# Patient Record
Sex: Male | Born: 1955 | Race: White | Hispanic: No | Smoking: Current every day smoker
Health system: Southern US, Community
[De-identification: ages and names within clinical notes are randomized; demographics above are authoritative.]

## PROBLEM LIST (undated history)

## (undated) DIAGNOSIS — C44692 Other specified malignant neoplasm of skin of right upper limb, including shoulder: Secondary | ICD-10-CM

## (undated) DIAGNOSIS — R112 Nausea with vomiting, unspecified: Secondary | ICD-10-CM

## (undated) DIAGNOSIS — C801 Malignant (primary) neoplasm, unspecified: Secondary | ICD-10-CM

## (undated) DIAGNOSIS — Z9889 Other specified postprocedural states: Secondary | ICD-10-CM

## (undated) DIAGNOSIS — F419 Anxiety disorder, unspecified: Secondary | ICD-10-CM

## (undated) DIAGNOSIS — R0600 Dyspnea, unspecified: Secondary | ICD-10-CM

## (undated) DIAGNOSIS — M545 Low back pain, unspecified: Secondary | ICD-10-CM

## (undated) DIAGNOSIS — K219 Gastro-esophageal reflux disease without esophagitis: Secondary | ICD-10-CM

## (undated) DIAGNOSIS — G8929 Other chronic pain: Secondary | ICD-10-CM

## (undated) DIAGNOSIS — T8859XA Other complications of anesthesia, initial encounter: Secondary | ICD-10-CM

## (undated) DIAGNOSIS — C61 Malignant neoplasm of prostate: Secondary | ICD-10-CM

## (undated) DIAGNOSIS — E785 Hyperlipidemia, unspecified: Secondary | ICD-10-CM

## (undated) HISTORY — PX: APPENDECTOMY: SHX54

## (undated) HISTORY — PX: OTHER SURGICAL HISTORY: SHX169

---

## 1997-01-08 HISTORY — PX: INGUINAL HERNIA REPAIR: SUR1180

## 2002-05-12 ENCOUNTER — Ambulatory Visit (HOSPITAL_COMMUNITY): Admission: RE | Admit: 2002-05-12 | Discharge: 2002-05-12 | Payer: Self-pay | Admitting: Internal Medicine

## 2002-05-12 ENCOUNTER — Encounter: Payer: Self-pay | Admitting: Internal Medicine

## 2002-05-20 ENCOUNTER — Encounter: Payer: Self-pay | Admitting: Internal Medicine

## 2002-05-20 ENCOUNTER — Ambulatory Visit (HOSPITAL_COMMUNITY): Admission: RE | Admit: 2002-05-20 | Discharge: 2002-05-20 | Payer: Self-pay | Admitting: Internal Medicine

## 2002-06-01 ENCOUNTER — Ambulatory Visit (HOSPITAL_COMMUNITY): Admission: RE | Admit: 2002-06-01 | Discharge: 2002-06-01 | Payer: Self-pay | Admitting: Internal Medicine

## 2003-04-26 ENCOUNTER — Ambulatory Visit (HOSPITAL_COMMUNITY): Admission: RE | Admit: 2003-04-26 | Discharge: 2003-04-26 | Payer: Self-pay | Admitting: Otolaryngology

## 2004-07-25 ENCOUNTER — Ambulatory Visit (HOSPITAL_COMMUNITY): Admission: RE | Admit: 2004-07-25 | Discharge: 2004-07-25 | Payer: Self-pay | Admitting: Internal Medicine

## 2004-07-31 ENCOUNTER — Ambulatory Visit (HOSPITAL_COMMUNITY): Admission: RE | Admit: 2004-07-31 | Discharge: 2004-07-31 | Payer: Self-pay | Admitting: Internal Medicine

## 2004-08-24 ENCOUNTER — Ambulatory Visit (HOSPITAL_COMMUNITY): Admission: RE | Admit: 2004-08-24 | Discharge: 2004-08-24 | Payer: Self-pay | Admitting: Internal Medicine

## 2005-07-27 ENCOUNTER — Ambulatory Visit: Payer: Self-pay | Admitting: Internal Medicine

## 2005-07-27 ENCOUNTER — Ambulatory Visit (HOSPITAL_COMMUNITY): Admission: RE | Admit: 2005-07-27 | Discharge: 2005-07-27 | Payer: Self-pay | Admitting: Internal Medicine

## 2010-01-08 HISTORY — PX: KNEE ARTHROSCOPY: SUR90

## 2011-06-11 ENCOUNTER — Other Ambulatory Visit (HOSPITAL_COMMUNITY): Payer: Self-pay | Admitting: Orthopaedic Surgery

## 2011-06-11 DIAGNOSIS — M25562 Pain in left knee: Secondary | ICD-10-CM

## 2011-06-13 ENCOUNTER — Other Ambulatory Visit (HOSPITAL_COMMUNITY): Payer: Self-pay

## 2011-06-18 ENCOUNTER — Ambulatory Visit (HOSPITAL_COMMUNITY)
Admission: RE | Admit: 2011-06-18 | Discharge: 2011-06-18 | Disposition: A | Discharge status: Home / Self Care | Payer: Managed Care, Other (non HMO) | Source: Ambulatory Visit | Attending: Orthopaedic Surgery | Admitting: Orthopaedic Surgery

## 2011-06-18 DIAGNOSIS — M23329 Other meniscus derangements, posterior horn of medial meniscus, unspecified knee: Secondary | ICD-10-CM | POA: Insufficient documentation

## 2011-06-18 DIAGNOSIS — M25569 Pain in unspecified knee: Secondary | ICD-10-CM | POA: Insufficient documentation

## 2011-06-18 DIAGNOSIS — M2350 Chronic instability of knee, unspecified knee: Secondary | ICD-10-CM | POA: Insufficient documentation

## 2011-06-18 DIAGNOSIS — M25562 Pain in left knee: Secondary | ICD-10-CM

## 2012-01-22 ENCOUNTER — Ambulatory Visit (HOSPITAL_COMMUNITY)
Admission: RE | Admit: 2012-01-22 | Discharge: 2012-01-22 | Disposition: A | Discharge status: Home / Self Care | Payer: BC Managed Care – PPO | Source: Ambulatory Visit | Attending: Orthopaedic Surgery | Admitting: Orthopaedic Surgery

## 2012-01-22 DIAGNOSIS — M545 Low back pain, unspecified: Secondary | ICD-10-CM | POA: Insufficient documentation

## 2012-01-22 DIAGNOSIS — IMO0001 Reserved for inherently not codable concepts without codable children: Secondary | ICD-10-CM | POA: Insufficient documentation

## 2012-01-22 DIAGNOSIS — M6281 Muscle weakness (generalized): Secondary | ICD-10-CM | POA: Insufficient documentation

## 2012-01-22 NOTE — Evaluation (Signed)
Physical Therapy Evaluation  Patient Details  Name: Carlos [name] MRN: [id number] Date of Birth: 10-Oct-1955  Today's Date: 01/22/2012 Time: 1308-6578 PT Time Calculation (min): 40 min Charges: 1 eval, 10' NMR Visit#: 1  of 8   Re-eval: 02/21/12 Assessment Diagnosis: LBP Next MD Visit: Dr. Magnus Ivan - 02/19/12  Authorization:    Authorization Time Period:    Authorization Visit#:   of     Past Medical History: No past medical history on file. Past Surgical History: No past surgical history on file.  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  His c/co is pain when he moves, when he stands for more than 30 minutes, L side hurts worse than R side How long can you sit comfortably?: no difficulty  How long can you stand comfortably?: less than 30 minutes and feels like his legs get weak.  How long can you walk comfortably?: no difficulty  Pain Assessment Currently in Pain?: Yes Pain Score:   3 (Range: 0-7/10.  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, kneeling, walking, squatting Comments: He enjoys going to church, hunting, fishing, and growing his vegetables  Cognition/Observation Observation/Other Assessments Observations: decreased flexibility to L quadricep, B hamstrings  Sensation/Coordination/Flexibility/Functional Tests Coordination Gross Motor Movements are Fluid and Coordinated: No Coordination and Movement Description: impaired core coordination to TrA, multifidus  and PF musculature  Functional Tests Functional Tests: ODI: 34%  Assessment RLE Strength Right Hip Flexion: 5/5 Right Hip Extension: 5/5 Right Hip ABduction: 5/5 Right Knee Flexion: 5/5 Right Knee Extension: 5/5 LLE Strength Left Hip Flexion: 5/5 Left Hip Extension: 5/5 Left Hip ABduction: 5/5 Left Knee Flexion: 5/5 Left Knee Extension: 5/5 Lumbar AROM Lumbar Flexion: WNL- no pain Lumbar Extension: decreased 75% - significant pain Lumbar - Right Side Bend: decreased 25% - pain Lumbar - Left Side Bend: decreased 25% - pain Palpation Palpation: significant fascial restrictions to L lumbar erector spinae   Exercise/Treatments Mobility/Balance  Posture/Postural Control Posture/Postural Control: Postural limitations Postural Limitations: ridgid spinal posture Static Standing Balance Single Leg Stance - Right Leg: 8  (impaired ankle strategy) Single Leg Stance - Left Leg: 8  Tandem Stance - Right Leg: 10  (impaired ankle strategy) Tandem Stance - Left Leg: 10  (impaired ankle strategy) Rhomberg - Eyes Opened: 10  Rhomberg - Eyes Closed: 10    Stretches   Aerobic   Machines for Strengthening   Standing   Seated   Supine Ab Set: Limitations AB Set Limitations: NMR to ab set 2x10 sec Other Supine Lumbar Exercises: Pelvic Floor: NMR using VC for appropriate contraction 3x10 sec holds after Sidelying   Prone  Other Prone Lumbar Exercises: multifidus: TC and VC for appropriate activation, unable to independently complete with significant compensations.  Quadruped       Physical Therapy Assessment and Plan PT Assessment and Plan Clinical Impression Statement: Pt is a 57 year old male referred to PT for LBP.   Pt will benefit from skilled therapeutic intervention in order to improve on the following  deficits: Pain;Decreased range of motion;Decreased coordination;Improper body mechanics Rehab Potential: Good PT Frequency: Min 2X/week PT Duration: 4 weeks PT  Treatment/Interventions: Functional mobility training;Therapeutic activities;Therapeutic exercise;Balance training;Neuromuscular re-education;Patient/family education;Manual techniques;Modalities PT Plan: Modalites as needed and manual techniques to decrease L errector spinae pain, core training, squats, heel/toe raises, balance activities.     Goals Home Exercise Program Pt will Perform Home Exercise Program: Independently PT Goal: Perform Home Exercise Program - Progress: Goal set today PT Short Term Goals Time to Complete Short Term Goals: 2 weeks PT Short Term Goal 1: Pt will report pain range 0-4/10 with lumbar movement PT Short Term Goal 2: Pt will improve lumbar back extension to WNL with pain less than 2/10 to L side of low back.  PT Short Term Goal 3: pt will improve core coordination and demonstrate TrA, PF and mutlifidus activiation independent of breath. PT Long Term Goals Time to Complete Long Term Goals: 4 weeks PT Long Term Goal 1: Pt will present with mild fascial restrictions throughout L errector spinae for improved QOL.  PT Long Term Goal 2: Pt will improve back AROM to WNL in order to complete his job requirements.  Long Term Goal 3: Pt will improve core strength in order to tolerate standing for greater than 30 minutes without increased pain.  Long Term Goal 4: Pt will improve his ODI to less than 22% for improved percieved functional ability.   Problem List Patient Active Problem List  Diagnosis  . Lumbago    PT Plan of Care PT Home Exercise Plan: see scanned report  PT Patient Instructions: importance of posture and HEP Consulted and Agree with Plan of Care: Patient  GP    Carlos [name] 01/22/2012, 4:07 PM  Physician Documentation Your signature is required to indicate approval of the treatment plan as stated above.  Please sign and either send electronically or make a copy of this report for your files and return this physician signed original.   Please mark  one 1.__approve of plan  2. ___approve of plan with the following conditions.   ______________________________                                                          _____________________ Physician Signature                                                                                                             Date

## 2012-01-25 ENCOUNTER — Inpatient Hospital Stay (HOSPITAL_COMMUNITY): Admission: RE | Admit: 2012-01-25 | Payer: Self-pay | Source: Ambulatory Visit

## 2012-01-29 ENCOUNTER — Inpatient Hospital Stay (HOSPITAL_COMMUNITY): Admission: RE | Admit: 2012-01-29 | Payer: Self-pay | Source: Ambulatory Visit | Admitting: Physical Therapy

## 2012-02-01 ENCOUNTER — Ambulatory Visit (HOSPITAL_COMMUNITY): Payer: Self-pay | Admitting: *Deleted

## 2012-02-05 ENCOUNTER — Ambulatory Visit (HOSPITAL_COMMUNITY): Payer: Self-pay | Admitting: Physical Therapy

## 2012-02-08 ENCOUNTER — Ambulatory Visit (HOSPITAL_COMMUNITY): Payer: Self-pay

## 2012-02-12 ENCOUNTER — Ambulatory Visit (HOSPITAL_COMMUNITY): Payer: Self-pay | Admitting: Physical Therapy

## 2012-02-15 ENCOUNTER — Ambulatory Visit (HOSPITAL_COMMUNITY): Payer: Self-pay | Admitting: Physical Therapy

## 2012-06-24 ENCOUNTER — Ambulatory Visit (HOSPITAL_COMMUNITY)
Admission: RE | Admit: 2012-06-24 | Discharge: 2012-06-24 | Disposition: A | Discharge status: Home / Self Care | Payer: BC Managed Care – PPO | Source: Ambulatory Visit | Attending: Internal Medicine | Admitting: Internal Medicine

## 2012-06-24 ENCOUNTER — Other Ambulatory Visit (HOSPITAL_COMMUNITY): Payer: Self-pay | Admitting: Internal Medicine

## 2012-06-24 DIAGNOSIS — M79605 Pain in left leg: Secondary | ICD-10-CM

## 2012-06-24 DIAGNOSIS — M545 Low back pain, unspecified: Secondary | ICD-10-CM | POA: Insufficient documentation

## 2012-06-24 DIAGNOSIS — M25559 Pain in unspecified hip: Secondary | ICD-10-CM | POA: Insufficient documentation

## 2012-06-24 DIAGNOSIS — M549 Dorsalgia, unspecified: Secondary | ICD-10-CM

## 2012-07-07 ENCOUNTER — Other Ambulatory Visit: Payer: Self-pay | Admitting: Plastic Surgery

## 2012-07-07 ENCOUNTER — Encounter (HOSPITAL_BASED_OUTPATIENT_CLINIC_OR_DEPARTMENT_OTHER): Payer: Self-pay | Admitting: *Deleted

## 2012-07-07 DIAGNOSIS — L98492 Non-pressure chronic ulcer of skin of other sites with fat layer exposed: Secondary | ICD-10-CM

## 2012-07-07 NOTE — H&P (Signed)
  This document contains confidential information from a [facility] medical record system and may be unauthenticated. Release may be made only with a valid authorization or in accordance with applicable policies of Medical Center or its affiliates. This document must be maintained in a secure manner or discarded/destroyed as required by Medical Center policy or by a confidential means such as shredding.    Carlos [name]  07/04/2012 9:30 AM   Initial consult  MRN:  [id number]  Provider: Wayland Denis, DO  Department:  Plastic Surgery  Dept Phone: [phone]     Diagnoses   Dermatofibrosarcoma protuberans of right upper extremity    -  Primary    173.69      Reason for Visit   Skin Problem       Vitals - Last Recorded    116/63  67  1.676 m (5\' 6" )  74.39 kg (164 lb)  26.48 kg/m2       Subjective:      Patient ID: Carlos [name] is a 57 y.o. male.   HPI The patient is a 57 yrs old wm here with his wife for evaluation of his right shoulder.  He had a nonhealing area that looked like a cyst for several months on his right shoulder.  The area was biopsied by Dr. Orvan Falconer and found to be a dermatofibroma protuberans.  He underwent mohs excision and the margins are now negative.  It is down to muscle with the fascia excised.  He is otherwise in stable and good health.  He is caring for the area with gauze at the present time.   The following portions of the patient's history were reviewed and updated as appropriate: allergies, current medications, past family history, past medical history, past social history, past surgical history and problem list.  Review of Systems  Constitutional: Negative.   HENT: Negative.   Eyes: Negative.   Respiratory: Negative.   Cardiovascular: Negative.   Gastrointestinal: Negative.   Endocrine: Negative.   Genitourinary: Negative.   Neurological: Negative.   Hematological: Negative.   Psychiatric/Behavioral: Negative.       Objective:    Physical Exam  Constitutional: He appears well-developed and well-nourished.  HENT:   Head: Normocephalic and atraumatic.  Eyes: Conjunctivae and EOM are normal. Pupils are equal, round, and reactive to light.  Neck: Normal range of motion.  Cardiovascular: Normal rate.   Pulmonary/Chest: Effort normal.  Abdominal: Soft.  Musculoskeletal: Normal range of motion.       Right shoulder: He exhibits no tenderness.       Arms: Neurological: He is alert.  Skin: Skin is warm.  Psychiatric: He has a normal mood and affect. His behavior is normal. Judgment and thought content normal.      Assessment:   1.  Dermatofibrosarcoma protuberans of right upper extremity        Plan:     Recommend ACell and Vac placement to improve healing outcome. Acell applied LP259-10, REF MM0100, SER [id number] LP201-10, REF MM0100, SER [id number]

## 2012-07-07 NOTE — Progress Notes (Addendum)
SPOKE W/ PT WIFE. NPO AFTER MN. ARRIVES AT 0830. NEEDS HG. VERBALIZED TO BRING VAC AND SUPPLIES DOS.

## 2012-07-09 ENCOUNTER — Encounter (HOSPITAL_BASED_OUTPATIENT_CLINIC_OR_DEPARTMENT_OTHER): Payer: Self-pay | Admitting: Plastic Surgery

## 2012-07-09 ENCOUNTER — Ambulatory Visit (HOSPITAL_BASED_OUTPATIENT_CLINIC_OR_DEPARTMENT_OTHER): Payer: BC Managed Care – PPO | Admitting: Anesthesiology

## 2012-07-09 ENCOUNTER — Ambulatory Visit: Admit: 2012-07-09 | Payer: Self-pay | Admitting: Plastic Surgery

## 2012-07-09 ENCOUNTER — Encounter (HOSPITAL_BASED_OUTPATIENT_CLINIC_OR_DEPARTMENT_OTHER): Payer: Self-pay | Admitting: Anesthesiology

## 2012-07-09 ENCOUNTER — Ambulatory Visit (HOSPITAL_BASED_OUTPATIENT_CLINIC_OR_DEPARTMENT_OTHER)
Admission: RE | Admit: 2012-07-09 | Discharge: 2012-07-09 | Disposition: A | Discharge status: Home / Self Care | Payer: BC Managed Care – PPO | Source: Ambulatory Visit | Attending: Plastic Surgery | Admitting: Plastic Surgery

## 2012-07-09 ENCOUNTER — Encounter (HOSPITAL_BASED_OUTPATIENT_CLINIC_OR_DEPARTMENT_OTHER)
Admission: RE | Disposition: A | Discharge status: Home / Self Care | Payer: Self-pay | Source: Ambulatory Visit | Attending: Plastic Surgery

## 2012-07-09 DIAGNOSIS — L988 Other specified disorders of the skin and subcutaneous tissue: Secondary | ICD-10-CM | POA: Insufficient documentation

## 2012-07-09 DIAGNOSIS — C44509 Unspecified malignant neoplasm of skin of other part of trunk: Secondary | ICD-10-CM | POA: Diagnosis present

## 2012-07-09 DIAGNOSIS — C44599 Other specified malignant neoplasm of skin of other part of trunk: Secondary | ICD-10-CM | POA: Diagnosis present

## 2012-07-09 DIAGNOSIS — L98492 Non-pressure chronic ulcer of skin of other sites with fat layer exposed: Secondary | ICD-10-CM

## 2012-07-09 DIAGNOSIS — Z85828 Personal history of other malignant neoplasm of skin: Secondary | ICD-10-CM | POA: Insufficient documentation

## 2012-07-09 HISTORY — DX: Other specified malignant neoplasm of skin of right upper limb, including shoulder: C44.692

## 2012-07-09 HISTORY — DX: Low back pain, unspecified: M54.50

## 2012-07-09 HISTORY — PX: APPLICATION OF A-CELL OF EXTREMITY: SHX6303

## 2012-07-09 HISTORY — DX: Low back pain: M54.5

## 2012-07-09 HISTORY — DX: Other chronic pain: G89.29

## 2012-07-09 HISTORY — DX: Hyperlipidemia, unspecified: E78.5

## 2012-07-09 HISTORY — DX: Anxiety disorder, unspecified: F41.9

## 2012-07-09 SURGERY — APPLICATION OF A-CELL OF EXTREMITY
Anesthesia: General | Site: Shoulder | Laterality: Right

## 2012-07-09 SURGERY — APPLICATION OF A-CELL OF EXTREMITY
Anesthesia: General | Site: Shoulder | Laterality: Right | Wound class: Clean

## 2012-07-09 MED ORDER — PROPOFOL 10 MG/ML IV BOLUS
INTRAVENOUS | Status: DC | PRN
  Administered 2012-07-09: 200 mg via INTRAVENOUS

## 2012-07-09 MED ORDER — SODIUM CHLORIDE 0.9 % IR SOLN
Status: DC | PRN
  Administered 2012-07-09: 11:00:00

## 2012-07-09 MED ORDER — EPHEDRINE SULFATE 50 MG/ML IJ SOLN
INTRAMUSCULAR | Status: DC | PRN
  Administered 2012-07-09: 10 mg via INTRAVENOUS

## 2012-07-09 MED ORDER — DEXAMETHASONE SODIUM PHOSPHATE 4 MG/ML IJ SOLN
INTRAMUSCULAR | Status: DC | PRN
  Administered 2012-07-09: 10 mg via INTRAVENOUS

## 2012-07-09 MED ORDER — ONDANSETRON HCL 4 MG/2ML IJ SOLN
INTRAMUSCULAR | Status: DC | PRN
  Administered 2012-07-09: 4 mg via INTRAVENOUS

## 2012-07-09 MED ORDER — FENTANYL CITRATE 0.05 MG/ML IJ SOLN
25.0000 ug | INTRAMUSCULAR | Status: DC | PRN
  Administered 2012-07-09 (×2): 50 ug via INTRAVENOUS
  Filled 2012-07-09: qty 1

## 2012-07-09 MED ORDER — LACTATED RINGERS IV SOLN
INTRAVENOUS | Status: DC
  Administered 2012-07-09: 100 mL/h via INTRAVENOUS
  Filled 2012-07-09: qty 1000

## 2012-07-09 MED ORDER — PROMETHAZINE HCL 25 MG/ML IJ SOLN
6.2500 mg | INTRAMUSCULAR | Status: DC | PRN
  Filled 2012-07-09: qty 1

## 2012-07-09 MED ORDER — CEFAZOLIN SODIUM-DEXTROSE 2-3 GM-% IV SOLR
2.0000 g | INTRAVENOUS | Status: AC
  Administered 2012-07-09: 2 g via INTRAVENOUS
  Filled 2012-07-09: qty 50

## 2012-07-09 MED ORDER — KETOROLAC TROMETHAMINE 30 MG/ML IJ SOLN
15.0000 mg | Freq: Once | INTRAMUSCULAR | Status: DC | PRN
  Filled 2012-07-09: qty 1

## 2012-07-09 MED ORDER — FENTANYL CITRATE 0.05 MG/ML IJ SOLN
INTRAMUSCULAR | Status: DC | PRN
  Administered 2012-07-09: 100 ug via INTRAVENOUS

## 2012-07-09 MED ORDER — MIDAZOLAM HCL 5 MG/5ML IJ SOLN
INTRAMUSCULAR | Status: DC | PRN
  Administered 2012-07-09: 2 mg via INTRAVENOUS

## 2012-07-09 MED ORDER — OXYCODONE HCL 5 MG PO TABS
5.0000 mg | ORAL_TABLET | ORAL | Status: DC | PRN
  Administered 2012-07-09: 5 mg via ORAL
  Filled 2012-07-09: qty 1

## 2012-07-09 MED ORDER — LIDOCAINE HCL (CARDIAC) 20 MG/ML IV SOLN
INTRAVENOUS | Status: DC | PRN
  Administered 2012-07-09: 80 mg via INTRAVENOUS

## 2012-07-09 SURGICAL SUPPLY — 94 items
APL SKNCLS STERI-STRIP NONHPOA (GAUZE/BANDAGES/DRESSINGS)
BAG DECANTER FOR FLEXI CONT (MISCELLANEOUS) IMPLANT
BANDAGE ELASTIC 3 VELCRO ST LF (GAUZE/BANDAGES/DRESSINGS) IMPLANT
BANDAGE ELASTIC 4 VELCRO ST LF (GAUZE/BANDAGES/DRESSINGS) IMPLANT
BANDAGE ELASTIC 6 VELCRO ST LF (GAUZE/BANDAGES/DRESSINGS) IMPLANT
BANDAGE GAUZE ELAST BULKY 4 IN (GAUZE/BANDAGES/DRESSINGS) IMPLANT
BENZOIN TINCTURE PRP APPL 2/3 (GAUZE/BANDAGES/DRESSINGS) IMPLANT
BLADE MINI RND TIP GREEN BEAV (BLADE) IMPLANT
BLADE SURG 10 STRL SS (BLADE) IMPLANT
BLADE SURG 15 STRL LF DISP TIS (BLADE) ×1 IMPLANT
BLADE SURG 15 STRL SS (BLADE) ×2
BNDG CMPR 9X4 STRL LF SNTH (GAUZE/BANDAGES/DRESSINGS)
BNDG COHESIVE 1X5 TAN STRL LF (GAUZE/BANDAGES/DRESSINGS) ×1 IMPLANT
BNDG COHESIVE 4X5 TAN NS LF (GAUZE/BANDAGES/DRESSINGS) IMPLANT
BNDG ESMARK 4X9 LF (GAUZE/BANDAGES/DRESSINGS) IMPLANT
CANISTER SUCT LVC 12 LTR MEDI- (MISCELLANEOUS) IMPLANT
CANISTER SUCTION 1200CC (MISCELLANEOUS) IMPLANT
CANISTER SUCTION 2500CC (MISCELLANEOUS) ×1 IMPLANT
CHLORAPREP W/TINT 26ML (MISCELLANEOUS) IMPLANT
CLOTH BEACON ORANGE TIMEOUT ST (SAFETY) ×2 IMPLANT
CORDS BIPOLAR (ELECTRODE) IMPLANT
COVER MAYO STAND STRL (DRAPES) ×2 IMPLANT
COVER TABLE BACK 60X90 (DRAPES) ×2 IMPLANT
DECANTER SPIKE VIAL GLASS SM (MISCELLANEOUS) IMPLANT
DRAIN PENROSE 18X1/2 LTX STRL (DRAIN) ×1 IMPLANT
DRAPE EXTREMITY T 121X128X90 (DRAPE) IMPLANT
DRAPE INCISE IOBAN 66X45 STRL (DRAPES) ×1 IMPLANT
DRAPE LG THREE QUARTER DISP (DRAPES) IMPLANT
DRSG EMULSION OIL 3X3 NADH (GAUZE/BANDAGES/DRESSINGS) ×1 IMPLANT
DRSG PAD ABDOMINAL 8X10 ST (GAUZE/BANDAGES/DRESSINGS) IMPLANT
ELECT NDL BLADE 2-5/6 (NEEDLE) IMPLANT
ELECT NDL TIP 2.8 STRL (NEEDLE) IMPLANT
ELECT NEEDLE BLADE 2-5/6 (NEEDLE) IMPLANT
ELECT NEEDLE TIP 2.8 STRL (NEEDLE) IMPLANT
ELECT REM PT RETURN 9FT ADLT (ELECTROSURGICAL) ×2
ELECTRODE REM PT RTRN 9FT ADLT (ELECTROSURGICAL) ×1 IMPLANT
GAUZE SPONGE 4X4 12PLY STRL LF (GAUZE/BANDAGES/DRESSINGS) IMPLANT
GAUZE XEROFORM 1X8 LF (GAUZE/BANDAGES/DRESSINGS) IMPLANT
GAUZE XEROFORM 5X9 LF (GAUZE/BANDAGES/DRESSINGS) IMPLANT
GLOVE BIO SURGEON STRL SZ 6.5 (GLOVE) ×3 IMPLANT
GLOVE ECLIPSE 6.5 STRL STRAW (GLOVE) ×1 IMPLANT
GLOVE INDICATOR 6.0 STRL GRN (GLOVE) ×1 IMPLANT
GOWN PREVENTION PLUS XLARGE (GOWN DISPOSABLE) ×1 IMPLANT
HANDPIECE INTERPULSE COAX TIP (DISPOSABLE)
IV NS IRRIG 3000ML ARTHROMATIC (IV SOLUTION) IMPLANT
MATRIX SURGICAL PSM 10X15CM (Tissue) ×1 IMPLANT
MICROMATRIX 500MG (Tissue) ×4 IMPLANT
NDL HYPO 30GX1 BEV (NEEDLE) IMPLANT
NEEDLE 27GAX1X1/2 (NEEDLE) IMPLANT
NEEDLE HYPO 30GX1 BEV (NEEDLE) IMPLANT
NS IRRIG 1000ML POUR BTL (IV SOLUTION) ×2 IMPLANT
PACK BASIN DAY SURGERY FS (CUSTOM PROCEDURE TRAY) ×2 IMPLANT
PADDING CAST ABS 3INX4YD NS (CAST SUPPLIES)
PADDING CAST ABS 4INX4YD NS (CAST SUPPLIES)
PADDING CAST ABS COTTON 3X4 (CAST SUPPLIES) IMPLANT
PADDING CAST ABS COTTON 4X4 ST (CAST SUPPLIES) IMPLANT
PENCIL BUTTON HOLSTER BLD 10FT (ELECTRODE) ×1 IMPLANT
SET HNDPC FAN SPRY TIP SCT (DISPOSABLE) IMPLANT
SLEEVE SCD COMPRESS KNEE MED (MISCELLANEOUS) IMPLANT
SLING ARM FOAM STRAP MED (SOFTGOODS) ×1 IMPLANT
SOLUTION PARTIC MCRMTRX 500MG (Tissue) IMPLANT
SPLINT PLASTER CAST XFAST 3X15 (CAST SUPPLIES) IMPLANT
SPLINT PLASTER XTRA FASTSET 3X (CAST SUPPLIES)
SPONGE GAUZE 4X4 12PLY (GAUZE/BANDAGES/DRESSINGS) ×2 IMPLANT
SPONGE LAP 18X18 X RAY DECT (DISPOSABLE) IMPLANT
SPONGE LAP 4X18 X RAY DECT (DISPOSABLE) IMPLANT
STAPLER VISISTAT 35W (STAPLE) IMPLANT
STOCKINETTE 4X48 STRL (DRAPES) IMPLANT
STOCKINETTE 6  STRL (DRAPES) ×1
STOCKINETTE 6 STRL (DRAPES) ×1 IMPLANT
STOCKINETTE IMPERVIOUS LG (DRAPES) IMPLANT
STRIP CLOSURE SKIN 1/2X4 (GAUZE/BANDAGES/DRESSINGS) IMPLANT
SUCTION FRAZIER TIP 10 FR DISP (SUCTIONS) IMPLANT
SURGILUBE 2OZ TUBE FLIPTOP (MISCELLANEOUS) IMPLANT
SUT ETHILON 3 0 PS 1 (SUTURE) IMPLANT
SUT ETHILON 4 0 P 3 18 (SUTURE) IMPLANT
SUT ETHILON 5 0 PS 2 18 (SUTURE) IMPLANT
SUT PROLENE 3 0 PS 2 (SUTURE) IMPLANT
SUT SILK 3 0 PS 1 (SUTURE) IMPLANT
SUT VIC AB 3-0 FS2 27 (SUTURE) ×3 IMPLANT
SUT VIC AB 5-0 P-3 18X BRD (SUTURE) IMPLANT
SUT VIC AB 5-0 P3 18 (SUTURE)
SUT VIC AB 5-0 PS2 18 (SUTURE) ×1 IMPLANT
SYR BULB IRRIGATION 50ML (SYRINGE) IMPLANT
SYR CONTROL 10ML LL (SYRINGE) ×2 IMPLANT
TAPE HYPAFIX 6X30 (GAUZE/BANDAGES/DRESSINGS) IMPLANT
TIP FLEX 45CM EVICEL (HEMOSTASIS) IMPLANT
TIP RIGID 35CM EVICEL (HEMOSTASIS) IMPLANT
TOWEL OR 17X24 6PK STRL BLUE (TOWEL DISPOSABLE) ×2 IMPLANT
TRAY DSU PREP LF (CUSTOM PROCEDURE TRAY) IMPLANT
TUBE CONNECTING 12X1/4 (SUCTIONS) IMPLANT
UNDERPAD 30X30 INCONTINENT (UNDERPADS AND DIAPERS) ×2 IMPLANT
WATER STERILE IRR 1000ML POUR (IV SOLUTION) ×2 IMPLANT
YANKAUER SUCT BULB TIP NO VENT (SUCTIONS) IMPLANT

## 2012-07-09 NOTE — Op Note (Signed)
Operative Note   SURGICAL DIVISION: Plastic Surgery  PREOPERATIVE DIAGNOSES:  Right shoulder mohs defect from basal cell skin cancer excision  POSTOPERATIVE DIAGNOSES:   Right shoulder mohs defect from basal cell skin cancer excision  PROCEDURE:  Partial skin advancement 2 cm with Acell and VAC to right shoulder defect from skin cancer excision 10 x 10 cm  SURGEON: Lyndel Sarate Sanger, DO  ASSISTANT: none  ANESTHESIA:  General.   COMPLICATIONS: None.   INDICATIONS FOR PROCEDURE: The patient is a 57 yrs old wm with a 10 x 10 cm shoulder defect after a mohs procedure.  He had a basal cell carcinoma excised. The margins were negative and the defect was to muscle and included fascia.   CONSENT:  Informed consent was obtained directly from the patient. Risks, benefits and alternatives were fully discussed. Specific risks including but not limited to bleeding, infection, hematoma, seroma, scarring, pain, implant infection, implant extrusion, capsular contracture, asymmetry, wound healing problems, and need for further surgery were all discussed. The patient did have an ample opportunity to have her questions answered to her satisfaction.   DESCRIPTION OF PROCEDURE:  The patient was taken to the operating room. SCDs were placed and IV antibiotics were given. The patient's operative site was prepped and draped in a sterile fashion. A time out was performed and all information was confirmed to be correct. The area was irrigated with antibiotic saline and regular saline.  1.5 cm of undermining was done circumferentially to advance the soft tissue for some closure.  Hemostasis was achieved with electrocautery.  The 3-0 Vicryl was used to secure the soft tissue.  The Acell powder was applied for 1 gm followed by the Acell sheet that was prepared according to the manufacture guidelines.  The sheet was secured to the skin with 5-0 Vicryl.  Adaptic and surgical lube was applied with the VAC sponge.  An excellent  seal was obtained.  The vac was set at 125 mmHg pressure.  The patient was allowed to wake from anesthesia, extubated and taken to the recovery room in satisfactory condition.

## 2012-07-09 NOTE — H&P (View-Only) (Signed)
  This document contains confidential information from a [facility] medical record system and may be unauthenticated. Release may be made only with a valid authorization or in accordance with applicable policies of Medical Center or its affiliates. This document must be maintained in a secure manner or discarded/destroyed as required by [facility] Center policy or by a confidential means such as shredding.    Carlos [name]  07/04/2012 9:30 AM   Initial consult  MRN:  [id number]  Provider: Claire Sanger, DO  Department:  Plastic Surgery  Dept Phone: [phone]     Diagnoses   Dermatofibrosarcoma protuberans of right upper extremity    -  Primary    173.69      Reason for Visit   Skin Problem       Vitals - Last Recorded    116/63  67  1.676 m (5' 6")  74.39 kg (164 lb)  26.48 kg/m2       Subjective:      Patient ID: Carlos [name] is a 56 y.o. male.   HPI The patient is a 56 yrs old wm here with his wife for evaluation of his right shoulder.  He had a nonhealing area that looked like a cyst for several months on his right shoulder.  The area was biopsied by Dr. Beavers and found to be a dermatofibroma protuberans.  He underwent mohs excision and the margins are now negative.  It is down to muscle with the fascia excised.  He is otherwise in stable and good health.  He is caring for the area with gauze at the present time.   The following portions of the patient's history were reviewed and updated as appropriate: allergies, current medications, past family history, past medical history, past social history, past surgical history and problem list.  Review of Systems  Constitutional: Negative.   HENT: Negative.   Eyes: Negative.   Respiratory: Negative.   Cardiovascular: Negative.   Gastrointestinal: Negative.   Endocrine: Negative.   Genitourinary: Negative.   Neurological: Negative.   Hematological: Negative.   Psychiatric/Behavioral: Negative.       Objective:    Physical Exam  Constitutional: He appears well-developed and well-nourished.  HENT:   Head: Normocephalic and atraumatic.  Eyes: Conjunctivae and EOM are normal. Pupils are equal, round, and reactive to light.  Neck: Normal range of motion.  Cardiovascular: Normal rate.   Pulmonary/Chest: Effort normal.  Abdominal: Soft.  Musculoskeletal: Normal range of motion.       Right shoulder: He exhibits no tenderness.       Arms: Neurological: He is alert.  Skin: Skin is warm.  Psychiatric: He has a normal mood and affect. His behavior is normal. Judgment and thought content normal.      Assessment:   1.  Dermatofibrosarcoma protuberans of right upper extremity        Plan:     Recommend ACell and Vac placement to improve healing outcome. Acell applied LP259-10, REF MM0100, SER [id number] LP201-10, REF MM0100, SER [id number]           

## 2012-07-09 NOTE — Transfer of Care (Signed)
Immediate Anesthesia Transfer of Care Note  Patient: Carlos [name]  Procedure(s) Performed: Procedure(s): PLACEMENT OF A-CELL TO UPPER EXTREMITY/PLACEMENT OF VAC (Right)  Patient Location: PACU  Anesthesia Type:General  Level of Consciousness: awake and alert   Airway & Oxygen Therapy: Patient Spontanous Breathing and Patient connected to nasal cannula oxygen  Post-op Assessment: Report given to PACU RN  Post vital signs: Reviewed and stable  Complications: No apparent anesthesia complications

## 2012-07-09 NOTE — Anesthesia Preprocedure Evaluation (Addendum)
Anesthesia Evaluation  Patient identified by MRN, date of birth, ID band Patient awake    Reviewed: Allergy & Precautions, H&P , NPO status , Patient's Chart, lab work & pertinent test results  Airway Mallampati: II TM Distance: >3 FB Neck ROM: Full    Dental  (+) Partial Upper   Pulmonary former smoker,  Smoking Status: Former Smoker - 40 pack years    Quit Smoking: 07/07/08   breath sounds clear to auscultation  Pulmonary exam normal       Cardiovascular negative cardio ROS  Rhythm:Regular Rate:Normal     Neuro/Psych Anxiety negative neurological ROS     GI/Hepatic negative GI ROS, Neg liver ROS,   Endo/Other  negative endocrine ROS  Renal/GU negative Renal ROS  negative genitourinary   Musculoskeletal negative musculoskeletal ROS (+)   Abdominal   Peds negative pediatric ROS (+)  Hematology negative hematology ROS (+)   Anesthesia Other Findings   Reproductive/Obstetrics negative OB ROS                          Anesthesia Physical Anesthesia Plan  ASA: II  Anesthesia Plan: General   Post-op Pain Management:    Induction: Intravenous  Airway Management Planned: LMA  Additional Equipment:   Intra-op Plan:   Post-operative Plan:   Informed Consent: I have reviewed the patients History and Physical, chart, labs and discussed the procedure including the risks, benefits and alternatives for the proposed anesthesia with the patient or authorized representative who has indicated his/her understanding and acceptance.   Dental advisory given  Plan Discussed with: CRNA and Surgeon  Anesthesia Plan Comments:         Anesthesia Quick Evaluation

## 2012-07-09 NOTE — Brief Op Note (Signed)
07/09/2012  10:28 AM  PATIENT:  Carlos [name]  57 y.o. male  PRE-OPERATIVE DIAGNOSIS:  Mohs defect of right shoulder after skin cancer resection  POST-OPERATIVE DIAGNOSIS:  same  PROCEDURE:  Procedure(s): PLACEMENT OF A-CELL TO UPPER EXTREMITY/PLACEMENT OF VAC (Right)  SURGEON:  Surgeon(s) and Role:    * Claire Sanger, DO - Primary  PHYSICIAN ASSISTANT: none  ASSISTANTS: none   ANESTHESIA:   general  EBL:     BLOOD ADMINISTERED:none  DRAINS: none   LOCAL MEDICATIONS USED:  NONE  SPECIMEN:  No Specimen  DISPOSITION OF SPECIMEN:  N/A  COUNTS:  YES  TOURNIQUET:  * No tourniquets in log *  DICTATION: .Dragon Dictation  PLAN OF CARE: Discharge to home after PACU  PATIENT DISPOSITION:  PACU - hemodynamically stable.   Delay start of Pharmacological VTE agent (>24hrs) due to surgical blood loss or risk of bleeding: no

## 2012-07-09 NOTE — Interval H&P Note (Signed)
History and Physical Interval Note:  07/09/2012 7:02 AM  Carlos [name]  has presented today for surgery, with the diagnosis of Cancer of right shoulder  The various methods of treatment have been discussed with the patient and family. After consideration of risks, benefits and other options for treatment, the patient has consented to  Procedure(s): PLACEMENT OF A-CELL TO UPPER EXTREMITY/PLACEMENT OF VAC (Right) as a surgical intervention .  The patient's history has been reviewed, patient examined, no change in status, stable for surgery.  I have reviewed the patient's chart and labs.  Questions were answered to the patient's satisfaction.     SANGER,Natayah Warmack

## 2012-07-09 NOTE — Anesthesia Postprocedure Evaluation (Signed)
  Anesthesia Post-op Note  Patient: Carlos [name]  Procedure(s) Performed: Procedure(s) (LRB): PLACEMENT OF A-CELL TO UPPER EXTREMITY/PLACEMENT OF VAC (Right)  Patient Location: PACU  Anesthesia Type: General  Level of Consciousness: awake and alert   Airway and Oxygen Therapy: Patient Spontanous Breathing  Post-op Pain: mild  Post-op Assessment: Post-op Vital signs reviewed, Patient's Cardiovascular Status Stable, Respiratory Function Stable, Patent Airway and No signs of Nausea or vomiting  Last Vitals:  Filed Vitals:   07/09/12 1121  BP: 133/79  Pulse: 98  Temp: 36.4 C  Resp:     Post-op Vital Signs: stable   Complications: No apparent anesthesia complications

## 2012-07-09 NOTE — Anesthesia Procedure Notes (Signed)
Procedure Name: LMA Insertion Date/Time: 07/09/2012 10:21 AM Performed by: Maris Berger T Pre-anesthesia Checklist: Patient identified, Emergency Drugs available, Suction available and Patient being monitored Patient Re-evaluated:Patient Re-evaluated prior to inductionOxygen Delivery Method: Circle System Utilized Preoxygenation: Pre-oxygenation with 100% oxygen Intubation Type: IV induction Ventilation: Mask ventilation without difficulty LMA: LMA inserted LMA Size: 4.0 Number of attempts: 1 Placement Confirmation: positive ETCO2 Dental Injury: Teeth and Oropharynx as per pre-operative assessment

## 2012-07-14 ENCOUNTER — Encounter (HOSPITAL_BASED_OUTPATIENT_CLINIC_OR_DEPARTMENT_OTHER): Payer: Self-pay | Admitting: Plastic Surgery

## 2012-07-14 LAB — POCT HEMOGLOBIN-HEMACUE: Hemoglobin: 12.3 g/dL — ABNORMAL LOW (ref 13.0–17.0)

## 2012-10-03 ENCOUNTER — Other Ambulatory Visit (HOSPITAL_COMMUNITY): Payer: Self-pay | Admitting: Internal Medicine

## 2012-10-03 DIAGNOSIS — M5416 Radiculopathy, lumbar region: Secondary | ICD-10-CM

## 2012-10-06 ENCOUNTER — Ambulatory Visit (HOSPITAL_COMMUNITY)
Admission: RE | Admit: 2012-10-06 | Discharge: 2012-10-06 | Disposition: A | Discharge status: Home / Self Care | Payer: BC Managed Care – PPO | Source: Ambulatory Visit | Attending: Internal Medicine | Admitting: Internal Medicine

## 2012-10-06 DIAGNOSIS — M47817 Spondylosis without myelopathy or radiculopathy, lumbosacral region: Secondary | ICD-10-CM | POA: Insufficient documentation

## 2012-10-06 DIAGNOSIS — M545 Low back pain, unspecified: Secondary | ICD-10-CM | POA: Insufficient documentation

## 2012-10-06 DIAGNOSIS — M5126 Other intervertebral disc displacement, lumbar region: Secondary | ICD-10-CM | POA: Insufficient documentation

## 2012-10-06 DIAGNOSIS — M5416 Radiculopathy, lumbar region: Secondary | ICD-10-CM

## 2012-10-07 ENCOUNTER — Other Ambulatory Visit (HOSPITAL_COMMUNITY): Payer: BC Managed Care – PPO

## 2012-10-07 ENCOUNTER — Ambulatory Visit (HOSPITAL_COMMUNITY): Payer: BC Managed Care – PPO

## 2012-10-15 ENCOUNTER — Ambulatory Visit (INDEPENDENT_AMBULATORY_CARE_PROVIDER_SITE_OTHER): Payer: BC Managed Care – PPO | Admitting: Diagnostic Neuroimaging

## 2012-10-15 ENCOUNTER — Encounter: Payer: Self-pay | Admitting: Diagnostic Neuroimaging

## 2012-10-15 VITALS — BP 118/78 | HR 74 | Temp 98.6°F | Ht 66.0 in | Wt 171.0 lb

## 2012-10-15 DIAGNOSIS — M545 Low back pain: Secondary | ICD-10-CM

## 2012-10-15 MED ORDER — GABAPENTIN 300 MG PO CAPS: 300.0000 mg | ORAL_CAPSULE | Freq: Three times a day (TID) | ORAL | Status: DC

## 2012-10-15 NOTE — Patient Instructions (Signed)
Try gabapentin 300mg  at bedtime. Increase to twice a day then three times a day over next 2-4 weeks. Stay on this medicine for 1-2 months at least.  Try physical therapy.

## 2012-10-15 NOTE — Progress Notes (Signed)
[facility]  PATIENT: Carlos [name] DOB: 09/14/1955  REFERRING CLINICIAN: Fagan HISTORY FROM: patient REASON FOR VISIT: new consult   HISTORICAL  CHIEF COMPLAINT:  Chief Complaint  Patient presents with  . Back Pain    HISTORY OF PRESENT ILLNESS:   57 year old right-handed male with anxiety and skin cancer, here for evaluation of low back pain since 2006. Symptoms have been intermittent since that time. Around May 2014 he had more constant severe pain radiating from his low back to his right leg. Movement aggravates his symptoms. Symptoms are fairly consistent now. She's tried hydrocodone with minimal relief. Has not tried physical therapy. No symptoms radiating to left leg. The symptoms and neck, hands or arms. No bowel or bladder dysfunction. No recent trauma or injuries.  REVIEW OF SYSTEMS: Full 14 system review of systems performed and notable only for hearing loss ringing in ears joint pain numbness weakness anxiety none of sleep insomnia.  ALLERGIES: No Known Allergies  HOME MEDICATIONS: Outpatient Prescriptions Prior to Visit  Medication Sig Dispense Refill  . acetaminophen (TYLENOL) 325 MG tablet Take 650 mg by mouth every 6 (six) hours as needed for pain.      . Ascorbic Acid (VITAMIN C) 1000 MG tablet Take 1,000 mg by mouth daily.      . clonazePAM (KLONOPIN) 1 MG tablet Take 1 mg by mouth at bedtime as needed for anxiety.      . diclofenac (VOLTAREN) 50 MG EC tablet Take 50 mg by mouth 3 (three) times daily.      . Multiple Vitamin (MULTIVITAMIN) tablet Take 1 tablet by mouth daily.      . simvastatin (ZOCOR) 20 MG tablet Take 20 mg by mouth every evening.      . traZODone (DESYREL) 50 MG tablet Take 50 mg by mouth at bedtime.      . Zinc 100 MG TABS Take 1 tablet by mouth daily.       No facility-administered medications prior to visit.    PAST MEDICAL HISTORY: Past Medical History  Diagnosis Date  . Hyperlipidemia   . Chronic low back  pain   . Dermatofibrosarcoma protubera of right shoulder   . Anxiety     PAST SURGICAL HISTORY: Past Surgical History  Procedure Laterality Date  . Inguinal hernia repair Left 1999  . Knee arthroscopy Left 2012  . Excision dermatofibrocarcoma protuberan right shoulder  2 - 3 WKS AGO  . Application of a-cell of extremity Right 07/09/2012    Procedure: PLACEMENT OF A-CELL TO UPPER EXTREMITY/PLACEMENT OF VAC;  Surgeon: Wayland Denis, DO;  Location: [facility];  Service: Plastics;  Laterality: Right;    FAMILY HISTORY: Family History  Problem Relation Age of Onset  . Dementia Mother   . Cancer Father     SOCIAL HISTORY:  History   Social History  . Marital Status: Married    Spouse Name: Burna Mortimer    Number of Children: 1  . Years of Education: 10th   Occupational History  .      [employer]   Social History Main Topics  . Smoking status: Former Smoker -- 1.00 packs/day for 40 years    Types: Cigarettes    Quit date: 07/07/2008  . Smokeless tobacco: Never Used  . Alcohol Use: No  . Drug Use: No  . Sexual Activity: Not on file   Other Topics Concern  . Not on file   Social History Narrative   Patient lives at home  with his spouse.   Caffeine Use: 1 cup of coffee; 3-4 sodas daily     PHYSICAL EXAM  Filed Vitals:   10/15/12 0825  BP: 118/78  Pulse: 74  Temp: 98.6 F (37 C)  TempSrc: Oral  Height: 5\' 6"  (1.676 m)  Weight: 171 lb (77.565 kg)    Not recorded    Body mass index is 27.61 kg/(m^2).  GENERAL EXAM: Patient is in no distress  CARDIOVASCULAR: Regular rate and rhythm, no murmurs, no carotid bruits  NEUROLOGIC: MENTAL STATUS: awake, alert, language fluent, comprehension intact, naming intact CRANIAL NERVE: no papilledema on fundoscopic exam, pupils equal and reactive to light, visual fields full to confrontation, extraocular muscles intact, no nystagmus, facial sensation and strength symmetric, uvula midline, shoulder shrug  symmetric, tongue midline. MOTOR: normal bulk and tone, full strength in the BUE, BLE; LIMITED IN RLE (HF) DUE TO PAIN. SENSORY: normal and symmetric to light touch, pinprick, temperature, vibration COORDINATION: finger-nose-finger, fine finger movements normal REFLEXES: BUE TRACE, LEFT KNEE TRACE, RIGHT KNEE 0, ANKLES 0. GAIT/STATION: narrow based gait; able to walk on toes, heels and tandem; romberg is negative   DIAGNOSTIC DATA (LABS, IMAGING, TESTING) - I reviewed patient records, labs, notes, testing and imaging myself where available.  Lab Results  Component Value Date   HGB 12.3* 07/09/2012   No results found for this basename: na, k, cl, co2, glucose, bun, creatinine, calcium, prot, albumin, ast, alt, alkphos, bilitot, gfrnonaa, gfraa   No results found for this basename: CHOL, HDL, LDLCALC, LDLDIRECT, TRIG, CHOLHDL   No results found for this basename: HGBA1C   No results found for this basename: VITAMINB12   No results found for this basename: TSH   I reviewed images myself and agree with interpretation.   10/07/12 MRI lumbar spine - minimal disc bulging at L2-3, L4-5 and L5-S1. no spinal stenosis or foraminal narrowing.    ASSESSMENT AND PLAN  57 y.o. year old male here with low back pain, intermittent since 2006, but much worse in May/June 2014. MRI lumbar spine unremarkable.  PLAN: - trial of gabapentin - trial of physical therapy - in future may consider pain clinic referral   Orders Placed This Encounter  Procedures  . Ambulatory referral to Physical Therapy   Return in about 3 months (around 01/15/2013) for with Edison Nasuti, MD 10/15/2012, 9:17 AM Certified in Neurology, Neurophysiology and Neuroimaging  [facility] [address] [place], [place] [address] [phone]

## 2012-10-28 ENCOUNTER — Ambulatory Visit: Payer: BC Managed Care – PPO | Attending: Diagnostic Neuroimaging | Admitting: Physical Therapy

## 2012-10-28 DIAGNOSIS — M25559 Pain in unspecified hip: Secondary | ICD-10-CM | POA: Insufficient documentation

## 2012-10-28 DIAGNOSIS — M545 Low back pain, unspecified: Secondary | ICD-10-CM | POA: Insufficient documentation

## 2012-10-28 DIAGNOSIS — IMO0001 Reserved for inherently not codable concepts without codable children: Secondary | ICD-10-CM | POA: Insufficient documentation

## 2012-10-29 ENCOUNTER — Ambulatory Visit: Payer: BC Managed Care – PPO | Admitting: Physical Therapy

## 2012-11-04 ENCOUNTER — Ambulatory Visit: Payer: BC Managed Care – PPO | Admitting: Physical Therapy

## 2012-11-05 ENCOUNTER — Ambulatory Visit: Payer: BC Managed Care – PPO | Admitting: Physical Therapy

## 2012-11-06 ENCOUNTER — Ambulatory Visit: Payer: BC Managed Care – PPO | Admitting: Physical Therapy

## 2012-11-12 ENCOUNTER — Ambulatory Visit: Payer: BC Managed Care – PPO | Attending: Diagnostic Neuroimaging | Admitting: Rehabilitation

## 2012-11-12 DIAGNOSIS — M545 Low back pain, unspecified: Secondary | ICD-10-CM | POA: Insufficient documentation

## 2012-11-12 DIAGNOSIS — IMO0001 Reserved for inherently not codable concepts without codable children: Secondary | ICD-10-CM | POA: Insufficient documentation

## 2012-11-12 DIAGNOSIS — M25559 Pain in unspecified hip: Secondary | ICD-10-CM | POA: Insufficient documentation

## 2012-11-13 ENCOUNTER — Ambulatory Visit: Payer: BC Managed Care – PPO | Admitting: Physical Therapy

## 2012-11-19 ENCOUNTER — Ambulatory Visit: Payer: BC Managed Care – PPO | Admitting: Rehabilitation

## 2012-11-20 ENCOUNTER — Ambulatory Visit: Payer: BC Managed Care – PPO | Admitting: Physical Therapy

## 2012-11-25 ENCOUNTER — Ambulatory Visit: Payer: BC Managed Care – PPO | Admitting: Rehabilitation

## 2012-11-27 ENCOUNTER — Ambulatory Visit: Payer: BC Managed Care – PPO | Admitting: Physical Therapy

## 2012-12-01 ENCOUNTER — Ambulatory Visit: Payer: BC Managed Care – PPO | Admitting: Physical Therapy

## 2012-12-03 ENCOUNTER — Ambulatory Visit: Payer: BC Managed Care – PPO | Admitting: Rehabilitation

## 2013-01-15 ENCOUNTER — Ambulatory Visit: Payer: BC Managed Care – PPO | Admitting: Nurse Practitioner

## 2013-01-21 ENCOUNTER — Other Ambulatory Visit: Payer: Self-pay | Admitting: *Deleted

## 2013-01-21 DIAGNOSIS — M79609 Pain in unspecified limb: Secondary | ICD-10-CM

## 2013-02-19 ENCOUNTER — Encounter: Payer: Self-pay | Admitting: Vascular Surgery

## 2013-02-20 ENCOUNTER — Encounter: Payer: BC Managed Care – PPO | Admitting: Vascular Surgery

## 2013-02-20 ENCOUNTER — Inpatient Hospital Stay (HOSPITAL_COMMUNITY): Admission: RE | Admit: 2013-02-20 | Payer: BC Managed Care – PPO | Source: Ambulatory Visit

## 2013-03-06 ENCOUNTER — Telehealth: Payer: Self-pay | Admitting: Diagnostic Neuroimaging

## 2013-03-06 NOTE — Telephone Encounter (Signed)
Pt is having problems with lower back pain, knees are wore out and hurting. Pt states he is having sharp/dull pains in hip/knees. Pt is hurting a lot more at work and is needing to get in to see Dr. Leta Baptist asap. Please call pt to set up an apt

## 2013-03-09 NOTE — Telephone Encounter (Signed)
Patient has been scheduled with Ms Chauncy Passy, since he had cancelled f/u with her on 01/08. Confirmed appt

## 2013-03-19 ENCOUNTER — Encounter: Payer: Self-pay | Admitting: Nurse Practitioner

## 2013-03-19 ENCOUNTER — Ambulatory Visit (INDEPENDENT_AMBULATORY_CARE_PROVIDER_SITE_OTHER): Payer: BC Managed Care – PPO | Admitting: Nurse Practitioner

## 2013-03-19 VITALS — BP 121/77 | HR 79 | Ht 65.0 in | Wt 178.0 lb

## 2013-03-19 DIAGNOSIS — R209 Unspecified disturbances of skin sensation: Secondary | ICD-10-CM

## 2013-03-19 DIAGNOSIS — M25569 Pain in unspecified knee: Secondary | ICD-10-CM

## 2013-03-19 DIAGNOSIS — M79671 Pain in right foot: Secondary | ICD-10-CM

## 2013-03-19 DIAGNOSIS — M79609 Pain in unspecified limb: Secondary | ICD-10-CM

## 2013-03-19 DIAGNOSIS — M545 Low back pain, unspecified: Secondary | ICD-10-CM

## 2013-03-19 DIAGNOSIS — M25562 Pain in left knee: Secondary | ICD-10-CM

## 2013-03-19 DIAGNOSIS — M79672 Pain in left foot: Secondary | ICD-10-CM

## 2013-03-19 DIAGNOSIS — R200 Anesthesia of skin: Secondary | ICD-10-CM

## 2013-03-19 DIAGNOSIS — M25561 Pain in right knee: Secondary | ICD-10-CM

## 2013-03-19 DIAGNOSIS — M79605 Pain in left leg: Secondary | ICD-10-CM

## 2013-03-19 MED ORDER — GABAPENTIN 300 MG PO CAPS: 300.0000 mg | ORAL_CAPSULE | Freq: Three times a day (TID) | ORAL | Status: DC

## 2013-03-19 NOTE — Patient Instructions (Addendum)
We are going to check a MRI of the brain and cervical spine, you may have this done in [place].  We will check an EMG/Nerve Conduction study, to be done in our office.  Continue Gabapentin for pain, may take 3 times per day, be careful as it may make you drowsy.  Follow up as needed after the studies are complete.    Back Pain, Adult Low back pain is very common. About 1 in 5 people have back pain.The cause of low back pain is rarely dangerous. The pain often gets better over time.About half of people with a sudden onset of back pain feel better in just 2 weeks. About 8 in 10 people feel better by 6 weeks.  CAUSES Some common causes of back pain include:  Strain of the muscles or ligaments supporting the spine.  Wear and tear (degeneration) of the spinal discs.  Arthritis.  Direct injury to the back. DIAGNOSIS Most of the time, the direct cause of low back pain is not known.However, back pain can be treated effectively even when the exact cause of the pain is unknown.Answering your caregiver's questions about your overall health and symptoms is one of the most accurate ways to make sure the cause of your pain is not dangerous. If your caregiver needs more information, he or she may order lab work or imaging tests (X-rays or MRIs).However, even if imaging tests show changes in your back, this usually does not require surgery. HOME CARE INSTRUCTIONS For many people, back pain returns.Since low back pain is rarely dangerous, it is often a condition that people can learn to [facility] their own.   Remain active. It is stressful on the back to sit or stand in one place. Do not sit, drive, or stand in one place for more than 30 minutes at a time. Take short walks on level surfaces as soon as pain allows.Try to increase the length of time you walk each day.  Do not stay in bed.Resting more than 1 or 2 days can delay your recovery.  Do not avoid exercise or work.Your body is made to  move.It is not dangerous to be active, even though your back may hurt.Your back will likely heal faster if you return to being active before your pain is gone.  Pay attention to your body when you bend and lift. Many people have less discomfortwhen lifting if they bend their knees, keep the load close to their bodies,and avoid twisting. Often, the most comfortable positions are those that put less stress on your recovering back.  Find a comfortable position to sleep. Use a firm mattress and lie on your side with your knees slightly bent. If you lie on your back, put a pillow under your knees.  Only take over-the-counter or prescription medicines as directed by your caregiver. Over-the-counter medicines to reduce pain and inflammation are often the most helpful.Your caregiver may prescribe muscle relaxant drugs.These medicines help dull your pain so you can more quickly return to your normal activities and healthy exercise.  Put ice on the injured area.  Put ice in a plastic bag.  Place a towel between your skin and the bag.  Leave the ice on for 15-20 minutes, 03-04 times a day for the first 2 to 3 days. After that, ice and heat may be alternated to reduce pain and spasms.  Ask your caregiver about trying back exercises and gentle massage. This may be of some benefit.  Avoid feeling anxious or stressed.Stress increases muscle  tension and can worsen back pain.It is important to recognize when you are anxious or stressed and learn ways to manage it.Exercise is a great option. SEEK MEDICAL CARE IF:  You have pain that is not relieved with rest or medicine.  You have pain that does not improve in 1 week.  You have new symptoms.  You are generally not feeling well. SEEK IMMEDIATE MEDICAL CARE IF:   You have pain that radiates from your back into your legs.  You develop new bowel or bladder control problems.  You have unusual weakness or numbness in your arms or legs.  You  develop nausea or vomiting.  You develop abdominal pain.  You feel faint. Document Released: 12/25/2004 Document Revised: 06/26/2011 Document Reviewed: 05/15/2010 [facility] Patient Information 2014 [place], [place].  Electroneurography Test This test measures the conduction of the nerves throughout the body. It is very similar to the way electricity travels throughout your house. When a stimulus is activated at one site, this test determines how long it takes to travel to another location such as making a muscle contract. It would be similar to turning on a light switch and measuring the time it takes for the light bulb to come on. This test is called electromyoneurography and studies the nerves. PREPARATION FOR TEST No preparation or fasting is necessary. NORMAL FINDINGS No evidence of peripheral nerve injury or disease (conduction velocity is usually lower in the elderly). Ranges for normal findings may vary among different laboratories and hospitals. You should always check with your doctor after having lab work or other tests done to discuss the meaning of your test results and whether your values are considered within normal limits. MEANING OF TEST  Your caregiver will go over the test results with you and discuss the importance and meaning of your results, as well as treatment options and the need for additional tests if necessary. OBTAINING THE TEST RESULTS It is your responsibility to obtain your test results. Ask the lab or department performing the test when and how you will get your results. Document Released: 04/27/2004 Document Revised: 03/19/2011 Document Reviewed: 12/05/2007 [facility] Patient Information 2014 [place], [place].

## 2013-03-19 NOTE — Progress Notes (Signed)
PATIENT: Carlos [name] DOB: 1955/08/13  REASON FOR VISIT: follow up, worsening back pain HISTORY FROM: patient  HISTORY OF PRESENT ILLNESS: 58 year old right-handed male with anxiety and skin cancer, here for evaluation of low back pain since 2006. Symptoms have been intermittent since that time. Around May 2014 he had more constant severe pain radiating from his low back to his right leg. Movement aggravates his symptoms. Symptoms are fairly consistent now. He's tried hydrocodone with minimal relief. Has not tried physical therapy. No symptoms radiating to left leg. The symptoms and neck, hands or arms. No bowel or bladder dysfunction. No recent trauma or injuries.   UPDATE 03/19/13 (LL):  He returns for follow up for worsening back pain, with sharp pain now in his bilateral knees and aching in his feet.  He states that pain shoots across his knees.  He has pain shooting down the back of the left leg, with intermittent numbness in both feet.  He attended Physical therapy with no improvement and he has had 3 steroid injections to his back at [facility] with no relief.  He is unable to continue doing his current job which included 8 hours of standing on concrete floor with frequent bending and squatting and is filing for SS disability on March 20.  REVIEW OF SYSTEMS: Full 14 system review of systems performed and notable only for hearing loss ringing in ears joint pain, back pain, aching muscles, muscle cramps, walking difficulty, numbness weakness anxiety, depression, insomnia.   ALLERGIES: No Known Allergies  HOME MEDICATIONS: Outpatient Prescriptions Prior to Visit  Medication Sig Dispense Refill  . Ascorbic Acid (VITAMIN C) 1000 MG tablet Take 1,000 mg by mouth daily.      . clonazePAM (KLONOPIN) 1 MG tablet Take 1 mg by mouth at bedtime as needed for anxiety.      Marland Kitchen HYDROcodone-acetaminophen (NORCO/VICODIN) 5-325 MG per tablet Take 1 tablet by mouth every 6 (six) hours as  needed.      Marland Kitchen acetaminophen (TYLENOL) 325 MG tablet Take 650 mg by mouth every 6 (six) hours as needed for pain.      Marland Kitchen diclofenac (VOLTAREN) 50 MG EC tablet Take 50 mg by mouth 3 (three) times daily.      Marland Kitchen gabapentin (NEURONTIN) 300 MG capsule Take 1 capsule (300 mg total) by mouth 3 (three) times daily.  90 capsule  6  . Multiple Vitamin (MULTIVITAMIN) tablet Take 1 tablet by mouth daily.      . simvastatin (ZOCOR) 20 MG tablet Take 20 mg by mouth every evening.      . traZODone (DESYREL) 50 MG tablet Take 50 mg by mouth at bedtime.      . Zinc 100 MG TABS Take 1 tablet by mouth daily.       No facility-administered medications prior to visit.     PHYSICAL EXAM  Filed Vitals:   03/19/13 1256  BP: 121/77  Pulse: 79  Height: 5\' 5"  (1.651 m)  Weight: 178 lb (80.74 kg)   Body mass index is 29.62 kg/(m^2).  Generalized: Well developed, in no acute distress  Head: normocephalic and atraumatic. Oropharynx benign  Neck: Supple, no carotid bruits  Cardiac: Regular rate rhythm, no murmur  Musculoskeletal: No deformity   NEUROLOGIC:  MENTAL STATUS: awake, alert, language fluent, comprehension intact, naming intact  CRANIAL NERVE:  pupils equal and reactive to light, visual fields full to confrontation, extraocular muscles intact, no nystagmus, facial sensation and strength symmetric, uvula midline, shoulder  shrug symmetric, tongue midline.  MOTOR: normal bulk and tone, full strength in the BUE, BLE; LIMITED IN RLE (HF) DUE TO PAIN.  SENSORY: normal and symmetric to light touch, pinprick, temperature, vibration  COORDINATION: finger-nose-finger, fine finger movements normal  REFLEXES: LEFT TRICEPS, BICEP, AND BRACHIORADIALIS 3+, RUE 2+,  BILAT KNEES 1+, ANKLES 1+.  GAIT/STATION: narrow based gait; able to walk on toes, heels and tandem; romberg is negative  10/07/12 MRI lumbar spine - minimal disc bulging at L2-3, L4-5 and L5-S1. no spinal stenosis or foraminal narrowing.   ASSESSMENT  AND PLAN 58 y.o. year old male here with low back pain, intermittent since 2006, but much worse in May/June 2014. MRI lumbar spine unremarkable. He returns today with worsening back pain, pain in bilateral knees, and bilateral foot pain with intermittent numbness.  PLAN:  - Continue gabapentin 300 mg TID for pain - MRI brain and cervical spine, to evaluate brisker reflexes in left upper extremity. - NCV/EMG to evaluate pain and intermittent numbness in Upper and lower extremities. - Rest as much as possible, continue exercises learned in Physical Therapy.  Orders Placed This Encounter  Procedures  . MR Cervical Spine Wo Contrast  . MR Brain Wo Contrast  . NCV with EMG(electromyography)   Meds ordered this encounter  Medications  . gabapentin (NEURONTIN) 300 MG capsule    Sig: Take 1 capsule (300 mg total) by mouth 3 (three) times daily.    Dispense:  90 capsule    Refill:  6    Order Specific Question:  Supervising Provider    Answer:  Penni Bombard [[id number]]   Return if symptoms worsen or fail to improve.  Philmore Pali, MSN, NP-C 03/19/2013, 2:09 PM [facility] [address] [place], [place] [address] [phone]  Note: This document was prepared with digital dictation and possible smart phrase technology. Any transcriptional errors that result from this process are unintentional.

## 2013-03-20 NOTE — Progress Notes (Signed)
I reviewed note and agree with plan. I also examined patient and helped develop plan.  Penni Bombard, MD 8/52/7782, 4:23 PM Certified in Neurology, Neurophysiology and Neuroimaging  [facility] [address], [place], [place] [address] [phone]

## 2013-04-01 ENCOUNTER — Encounter (INDEPENDENT_AMBULATORY_CARE_PROVIDER_SITE_OTHER): Payer: Self-pay

## 2013-04-01 ENCOUNTER — Ambulatory Visit (INDEPENDENT_AMBULATORY_CARE_PROVIDER_SITE_OTHER): Payer: BC Managed Care – PPO | Admitting: Neurology

## 2013-04-01 ENCOUNTER — Ambulatory Visit (INDEPENDENT_AMBULATORY_CARE_PROVIDER_SITE_OTHER): Payer: BC Managed Care – PPO

## 2013-04-01 DIAGNOSIS — R209 Unspecified disturbances of skin sensation: Secondary | ICD-10-CM

## 2013-04-01 DIAGNOSIS — M79672 Pain in left foot: Secondary | ICD-10-CM

## 2013-04-01 DIAGNOSIS — M79671 Pain in right foot: Secondary | ICD-10-CM

## 2013-04-01 DIAGNOSIS — M25562 Pain in left knee: Secondary | ICD-10-CM

## 2013-04-01 DIAGNOSIS — M545 Low back pain, unspecified: Secondary | ICD-10-CM

## 2013-04-01 DIAGNOSIS — R200 Anesthesia of skin: Secondary | ICD-10-CM

## 2013-04-01 DIAGNOSIS — Z0289 Encounter for other administrative examinations: Secondary | ICD-10-CM

## 2013-04-01 DIAGNOSIS — M25569 Pain in unspecified knee: Secondary | ICD-10-CM

## 2013-04-01 DIAGNOSIS — M79609 Pain in unspecified limb: Secondary | ICD-10-CM

## 2013-04-01 DIAGNOSIS — M25561 Pain in right knee: Secondary | ICD-10-CM

## 2013-04-01 DIAGNOSIS — M79605 Pain in left leg: Secondary | ICD-10-CM

## 2013-04-01 NOTE — Procedures (Signed)
     HISTORY:  Carlos [name] is a 58 year old gentleman with a chronic history of low back pain with pain radiating down the left leg to the heel. On the right side, the patient will have discomfort down to the right knee. The patient also reports numbness and tingly sensations in both feet. The patient is being evaluated for possible neuropathy or a lumbosacral radiculopathy.  NERVE CONDUCTION STUDIES:  Nerve conduction studies were performed on the right upper extremity. The distal motor latencies and motor amplitudes for the median and ulnar nerves were within normal limits. The F wave latencies and nerve conduction velocities for these nerves were also normal. The sensory latencies for the median and ulnar nerves were normal.  Nerve conduction studies were performed on both lower extremities. The distal motor latencies and motor amplitudes for the peroneal and posterior tibial nerves were within normal limits. The nerve conduction velocities for these nerves were also normal. The H reflex latencies were normal. The sensory latencies for the peroneal nerves were within normal limits.   EMG STUDIES:  EMG study was performed on the right lower extremity:  The tibialis anterior muscle reveals 2 to 4K motor units with full recruitment. No fibrillations or positive waves were seen. The peroneus tertius muscle reveals 2 to 4K motor units with full recruitment. No fibrillations or positive waves were seen. The medial gastrocnemius muscle reveals 1 to 3K motor units with full recruitment. No fibrillations or positive waves were seen. The vastus lateralis muscle reveals 2 to 4K motor units with full recruitment. No fibrillations or positive waves were seen. The iliopsoas muscle reveals 2 to 4K motor units with full recruitment. No fibrillations or positive waves were seen. The biceps femoris muscle (long head) reveals 2 to 4K motor units with full recruitment. No fibrillations or positive waves were  seen. The lumbosacral paraspinal muscles were tested at 3 levels, and revealed no abnormalities of insertional activity at all 3 levels tested. There was good relaxation.  EMG study was performed on the left lower extremity:  The tibialis anterior muscle reveals 2 to 4K motor units with full recruitment. No fibrillations or positive waves were seen. The peroneus tertius muscle reveals 2 to 4K motor units with full recruitment. No fibrillations or positive waves were seen. The medial gastrocnemius muscle reveals 1 to 3K motor units with full recruitment. No fibrillations or positive waves were seen. The vastus lateralis muscle reveals 2 to 4K motor units with full recruitment. No fibrillations or positive waves were seen. The iliopsoas muscle reveals 2 to 4K motor units with full recruitment. No fibrillations or positive waves were seen. The biceps femoris muscle (long head) reveals 2 to 4K motor units with full recruitment. No fibrillations or positive waves were seen. The lumbosacral paraspinal muscles were tested at 3 levels, and revealed no abnormalities of insertional activity at all 3 levels tested. There was good relaxation.   IMPRESSION:  Nerve conduction studies done on the right upper extremity and both lower extremities were unremarkable, without evidence of a peripheral neuropathy. A small fiber neuropathy may be missed by standard nerve conduction studies, however. Clinical correlation is required. EMG evaluation of both lower extremities were unremarkable, without evidence of an overlying lumbosacral radiculopathy on either side.  Jill Alexanders MD 04/01/2013 11:05 AM  [facility] [address] [place], [place] [address]  Phone [phone] Fax [phone]

## 2013-04-14 ENCOUNTER — Telehealth: Payer: Self-pay | Admitting: Diagnostic Neuroimaging

## 2013-04-14 NOTE — Telephone Encounter (Signed)
I called the patient and gave results of MRI scan of the brain and cervical spine. He had questions about his work and for the plan of treatment I advised him to call back tomorrow and discuss this with Alphonsus Sias, NP who saw him during his last visit

## 2013-04-14 NOTE — Telephone Encounter (Signed)
Pt calling requesting MRI results. Sending to [facility], Dr. Leonie Man, please advise

## 2013-04-14 NOTE — Telephone Encounter (Signed)
Patient calling to request MRI results. Please call and advise patient.  °

## 2013-04-15 ENCOUNTER — Telehealth: Payer: Self-pay | Admitting: Diagnostic Neuroimaging

## 2013-04-15 NOTE — Telephone Encounter (Signed)
Spoke to Carlos [name], patient called wondering about disability and plan of care.  I explained that I did not know if [employer] had contacted our office about his claim yet.  I do not know if they will approve his claim.  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  Both conditions do not require surgery.  I do not see that we can offer him any other treatment other than referral to pain clinic or [facility] for an evaluation.  He stated he would consider it and call back if needed.

## 2013-04-15 NOTE — Telephone Encounter (Signed)
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  His question for Ms Chauncy Passy was how hard is it to file and get disability. Informed patient that would be a question for the Social Security Dept.,he verbalized understanding.

## 2013-04-15 NOTE — Telephone Encounter (Signed)
Pt has questions about his work and plan of treatment. Please advise

## 2013-04-15 NOTE — Telephone Encounter (Signed)
Pt called.  He stated that he would like Charlott Holler to call him back.  He would like to discuss disability with her.  He filed for disability ~ 1st of March. Please see telephone message dated 04-14-13. Please call to discuss.  Thank you

## 2013-04-22 ENCOUNTER — Telehealth: Payer: Self-pay | Admitting: Diagnostic Neuroimaging

## 2013-04-22 NOTE — Telephone Encounter (Signed)
Pt called to see about a letter that can be sent to his attorney about his position at work. Pt states he has to have something that says pt can't be on his feet and states that [employer] said it could take 3-6 months to find out about his disability. Please call pt concerning this matter. Thanks

## 2013-04-23 NOTE — Telephone Encounter (Signed)
Pt calling to see if he could get a letter written stating that he is unable to stand on his feet for long periods of time. Pt states that he needed for his lawyer for disability. Please advise

## 2013-04-27 NOTE — Telephone Encounter (Signed)
Patient calling to check on the status of this letter, please call and advise patient.

## 2013-04-27 NOTE — Telephone Encounter (Signed)
I called and spoke to pt.  Relayed that for letter he will need to speak to his pcp, Dr. Willey Blade.  For SSD, they have there own MD's that do exams.  Records faxed to Dr. Willey Blade for his records.  Pt verbalized understanding.

## 2013-04-27 NOTE — Telephone Encounter (Signed)
He should ask his PCP or pain mgmt clinic. -VRP

## 2013-04-29 ENCOUNTER — Telehealth: Payer: Self-pay | Admitting: Diagnostic Neuroimaging

## 2013-04-29 NOTE — Telephone Encounter (Signed)
Calling on behalf of her husband states the want to know if Dr. Leta Baptist plans to see him again. And if not is he going to refer him to another provider. I advised his wife that as far as the referral he should contact his PCP but she states they don't know if Dr. Leta Baptist plans to see him again. Repeating that something is wrong with his leg and he wants something done

## 2013-04-30 NOTE — Telephone Encounter (Signed)
Pt's wife Mariann Laster calling stating that the pt is having problems with his leg and wanted to know if Dr. Leta Baptist is going to see him again and if not is he going to refer him to another provider. Pt's last OV was on 03/19/13 with lynn, NP. Please advise

## 2013-05-01 NOTE — Telephone Encounter (Signed)
No further advice from our standpoint. Recommend he follow up with pain mgmt or PCP. -VRP

## 2013-05-04 NOTE — Telephone Encounter (Signed)
Called pt and inform him per Dr. Leta Baptist that there were no further advice from our standpoint and that the doctor recommends that pt follow up with pain mgmt or PCP. Pt verbalized understanding.

## 2013-05-29 ENCOUNTER — Other Ambulatory Visit (HOSPITAL_COMMUNITY): Payer: Self-pay | Admitting: Neurosurgery

## 2013-05-29 DIAGNOSIS — M5416 Radiculopathy, lumbar region: Secondary | ICD-10-CM

## 2013-06-04 ENCOUNTER — Ambulatory Visit (HOSPITAL_COMMUNITY)
Admission: RE | Admit: 2013-06-04 | Discharge: 2013-06-04 | Disposition: A | Discharge status: Home / Self Care | Payer: BC Managed Care – PPO | Source: Ambulatory Visit | Attending: Neurosurgery | Admitting: Neurosurgery

## 2013-06-04 DIAGNOSIS — M51379 Other intervertebral disc degeneration, lumbosacral region without mention of lumbar back pain or lower extremity pain: Secondary | ICD-10-CM | POA: Insufficient documentation

## 2013-06-04 DIAGNOSIS — M545 Low back pain, unspecified: Secondary | ICD-10-CM | POA: Insufficient documentation

## 2013-06-04 DIAGNOSIS — M5416 Radiculopathy, lumbar region: Secondary | ICD-10-CM

## 2013-06-04 DIAGNOSIS — M47817 Spondylosis without myelopathy or radiculopathy, lumbosacral region: Secondary | ICD-10-CM | POA: Insufficient documentation

## 2013-06-04 DIAGNOSIS — M79609 Pain in unspecified limb: Secondary | ICD-10-CM | POA: Insufficient documentation

## 2013-06-04 DIAGNOSIS — M25559 Pain in unspecified hip: Secondary | ICD-10-CM | POA: Insufficient documentation

## 2013-06-04 DIAGNOSIS — M5137 Other intervertebral disc degeneration, lumbosacral region: Secondary | ICD-10-CM | POA: Insufficient documentation

## 2014-01-07 ENCOUNTER — Ambulatory Visit (HOSPITAL_COMMUNITY)
Admission: RE | Admit: 2014-01-07 | Discharge: 2014-01-07 | Disposition: A | Discharge status: Home / Self Care | Payer: Disability Insurance | Source: Ambulatory Visit | Attending: Family Medicine | Admitting: Family Medicine

## 2014-01-07 ENCOUNTER — Other Ambulatory Visit (HOSPITAL_COMMUNITY): Payer: Self-pay | Admitting: Family Medicine

## 2014-01-07 DIAGNOSIS — R52 Pain, unspecified: Secondary | ICD-10-CM

## 2014-01-07 DIAGNOSIS — M25511 Pain in right shoulder: Secondary | ICD-10-CM | POA: Insufficient documentation

## 2014-01-07 DIAGNOSIS — G8929 Other chronic pain: Secondary | ICD-10-CM | POA: Insufficient documentation

## 2014-01-07 DIAGNOSIS — M25561 Pain in right knee: Secondary | ICD-10-CM | POA: Diagnosis not present

## 2014-01-07 DIAGNOSIS — M25562 Pain in left knee: Secondary | ICD-10-CM | POA: Diagnosis not present

## 2014-01-07 DIAGNOSIS — M549 Dorsalgia, unspecified: Secondary | ICD-10-CM | POA: Diagnosis not present

## 2015-05-30 ENCOUNTER — Other Ambulatory Visit (HOSPITAL_COMMUNITY): Payer: Self-pay | Admitting: Respiratory Therapy

## 2015-05-30 DIAGNOSIS — G4733 Obstructive sleep apnea (adult) (pediatric): Secondary | ICD-10-CM

## 2015-07-15 ENCOUNTER — Encounter (INDEPENDENT_AMBULATORY_CARE_PROVIDER_SITE_OTHER): Payer: Self-pay | Admitting: *Deleted

## 2015-07-19 DIAGNOSIS — M47817 Spondylosis without myelopathy or radiculopathy, lumbosacral region: Secondary | ICD-10-CM | POA: Diagnosis not present

## 2015-07-19 DIAGNOSIS — G894 Chronic pain syndrome: Secondary | ICD-10-CM | POA: Diagnosis not present

## 2015-07-19 DIAGNOSIS — M5417 Radiculopathy, lumbosacral region: Secondary | ICD-10-CM | POA: Diagnosis not present

## 2015-07-19 DIAGNOSIS — G4700 Insomnia, unspecified: Secondary | ICD-10-CM | POA: Diagnosis not present

## 2015-07-20 ENCOUNTER — Other Ambulatory Visit (HOSPITAL_COMMUNITY): Payer: Self-pay | Admitting: *Deleted

## 2015-07-20 ENCOUNTER — Other Ambulatory Visit (HOSPITAL_COMMUNITY): Payer: Self-pay | Admitting: Internal Medicine

## 2015-07-22 ENCOUNTER — Other Ambulatory Visit (HOSPITAL_COMMUNITY): Payer: Self-pay | Admitting: Internal Medicine

## 2015-07-22 DIAGNOSIS — R29898 Other symptoms and signs involving the musculoskeletal system: Secondary | ICD-10-CM

## 2015-07-25 ENCOUNTER — Ambulatory Visit (HOSPITAL_COMMUNITY)
Admission: RE | Admit: 2015-07-25 | Discharge: 2015-07-25 | Disposition: A | Discharge status: Home / Self Care | Payer: Medicare Other | Source: Ambulatory Visit | Attending: Internal Medicine | Admitting: Internal Medicine

## 2015-07-25 DIAGNOSIS — R29898 Other symptoms and signs involving the musculoskeletal system: Secondary | ICD-10-CM | POA: Diagnosis not present

## 2015-07-25 DIAGNOSIS — R531 Weakness: Secondary | ICD-10-CM | POA: Diagnosis not present

## 2015-07-26 ENCOUNTER — Other Ambulatory Visit (HOSPITAL_COMMUNITY): Payer: Self-pay | Admitting: Physical Medicine and Rehabilitation

## 2015-07-26 ENCOUNTER — Ambulatory Visit: Payer: Medicare Other | Attending: Physical Medicine and Rehabilitation | Admitting: Neurology

## 2015-07-26 DIAGNOSIS — G4733 Obstructive sleep apnea (adult) (pediatric): Secondary | ICD-10-CM | POA: Insufficient documentation

## 2015-07-26 DIAGNOSIS — G4761 Periodic limb movement disorder: Secondary | ICD-10-CM | POA: Diagnosis not present

## 2015-07-26 DIAGNOSIS — M5416 Radiculopathy, lumbar region: Secondary | ICD-10-CM

## 2015-08-05 ENCOUNTER — Ambulatory Visit (HOSPITAL_COMMUNITY)
Admission: RE | Admit: 2015-08-05 | Discharge: 2015-08-05 | Disposition: A | Discharge status: Home / Self Care | Payer: Medicare Other | Source: Ambulatory Visit | Attending: Physical Medicine and Rehabilitation | Admitting: Physical Medicine and Rehabilitation

## 2015-08-05 DIAGNOSIS — M5416 Radiculopathy, lumbar region: Secondary | ICD-10-CM | POA: Diagnosis not present

## 2015-08-05 DIAGNOSIS — M5126 Other intervertebral disc displacement, lumbar region: Secondary | ICD-10-CM | POA: Diagnosis not present

## 2015-08-05 DIAGNOSIS — M5136 Other intervertebral disc degeneration, lumbar region: Secondary | ICD-10-CM | POA: Diagnosis not present

## 2015-08-07 NOTE — Procedures (Signed)
  [place] A. Merlene Laughter, MD     [email or web address]             NOCTURNAL POLYSOMNOGRAPHY   LOCATION: [facility]   Patient Name: [name], [name] Study Date: 07/26/2015 Gender: Male D.O.B: 15-Dec-1955 Age (years): 39 Referring Provider: Not Available Height (inches): 66 Interpreting Physician: Phillips Odor MD, ABSM Weight (lbs): 160 RPSGT: Rosebud Poles BMI: 26 MRN: [id number] Neck Size: 15.00 CLINICAL INFORMATION Sleep Study Type: NPSG Indication for sleep study: N/A Epworth Sleepiness Score: 5 SLEEP STUDY TECHNIQUE As per the AASM Manual for the Scoring of Sleep and Associated Events v2.3 (April 2016) with a hypopnea requiring 4% desaturations. The channels recorded and monitored were frontal, central and occipital EEG, electrooculogram (EOG), submentalis EMG (chin), nasal and oral airflow, thoracic and abdominal wall motion, anterior tibialis EMG, snore microphone, electrocardiogram, and pulse oximetry. MEDICATIONS Patient's medications include: N/A. Medications self-administered by patient during sleep study : No sleep medicine administered.  Current Outpatient Prescriptions:  .  Ascorbic Acid (VITAMIN C) 1000 MG tablet, Take 1,000 mg by mouth daily., Disp: , Rfl:  .  clonazePAM (KLONOPIN) 1 MG tablet, Take 1 mg by mouth at bedtime as needed for anxiety., Disp: , Rfl:  .  gabapentin (NEURONTIN) 300 MG capsule, Take 1 capsule (300 mg total) by mouth 3 (three) times daily., Disp: 90 capsule, Rfl: 6 .  HYDROcodone-acetaminophen (NORCO/VICODIN) 5-325 MG per tablet, Take 1 tablet by mouth every 6 (six) hours as needed., Disp: , Rfl:   SLEEP ARCHITECTURE The study was initiated at 9:51:36 PM and ended at 5:20:14 AM. Sleep onset time was 35.3 minutes and the sleep efficiency was 87.6%. The total sleep time was 393.0 minutes. Stage REM latency was 69.0 minutes. The patient spent 2.16% of the night in stage N1 sleep, 79.39% in stage N2 sleep, 0.00% in stage N3  and 18.45% in REM. Alpha intrusion was absent. Supine sleep was 68.77%. RESPIRATORY PARAMETERS The overall apnea/hypopnea index (AHI) was 2.0 per hour. There were 10 total apneas, including 9 obstructive, 0 central and 1 mixed apneas. There were 3 hypopneas and 0 RERAs. The AHI during Stage REM sleep was 5.8 per hour. AHI while supine was 2.9 per hour. The mean oxygen saturation was 92.23%. The minimum SpO2 during sleep was 86.00%. Moderate snoring was noted during this study. CARDIAC DATA The 2 lead EKG demonstrated sinus rhythm. The mean heart rate was N/A beats per minute. Other EKG findings include: PVCs. LEG MOVEMENT DATA The total PLMS were 242 with a resulting PLMS index of 36.95. Associated arousal with leg movement index was 4.1.   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 No significant associated arousals. - Absent slow wave sleep. - No significant obstructive sleep apnea occurred during this study.      Carlos Metz, MD Diplomate, American Board of Sleep Medicine.

## 2015-08-16 DIAGNOSIS — M5417 Radiculopathy, lumbosacral region: Secondary | ICD-10-CM | POA: Diagnosis not present

## 2015-08-22 ENCOUNTER — Encounter (INDEPENDENT_AMBULATORY_CARE_PROVIDER_SITE_OTHER): Payer: Self-pay | Admitting: *Deleted

## 2015-08-22 ENCOUNTER — Other Ambulatory Visit (INDEPENDENT_AMBULATORY_CARE_PROVIDER_SITE_OTHER): Payer: Self-pay | Admitting: *Deleted

## 2015-08-22 ENCOUNTER — Telehealth (INDEPENDENT_AMBULATORY_CARE_PROVIDER_SITE_OTHER): Payer: Self-pay | Admitting: *Deleted

## 2015-08-22 DIAGNOSIS — Z1211 Encounter for screening for malignant neoplasm of colon: Secondary | ICD-10-CM | POA: Insufficient documentation

## 2015-08-22 DIAGNOSIS — Z0000 Encounter for general adult medical examination without abnormal findings: Secondary | ICD-10-CM | POA: Diagnosis not present

## 2015-08-22 NOTE — Telephone Encounter (Signed)
Patient needs trilyte 

## 2015-08-24 MED ORDER — PEG 3350-KCL-NA BICARB-NACL 420 G PO SOLR: 4000.0000 mL | Freq: Once | ORAL | 0 refills | Status: AC

## 2015-08-31 ENCOUNTER — Telehealth (INDEPENDENT_AMBULATORY_CARE_PROVIDER_SITE_OTHER): Payer: Self-pay | Admitting: *Deleted

## 2015-08-31 NOTE — Telephone Encounter (Signed)
agree

## 2015-08-31 NOTE — Telephone Encounter (Signed)
Referring MD/PCP: hall   Procedure: tcs with propofol  Reason/Indication:  screening  Has patient had this procedure before?  Yes, 10 yrs ago  If so, when, by whom and where?    Is there a family history of colon cancer?  no  Who?  What age when diagnosed?    Is patient diabetic?   no      Does patient have prosthetic heart valve or mechanical valve?  no  Do you have a pacemaker?  no  Has patient ever had endocarditis? no  Has patient had joint replacement within last 12 months?  no  Does patient tend to be constipated or take laxatives? no  Does patient have a history of alcohol/drug use?  no  Is patient on Coumadin, Plavix and/or Aspirin? no  Medications: hydrocodone 5/325 mg 3-4 times a day, lorazepam 1 mg bid, simvastatin 40 mg daily  Allergies: nkda  Medication Adjustment:   Procedure date & time: 09/30/15 at 830

## 2015-09-09 DIAGNOSIS — R1030 Lower abdominal pain, unspecified: Secondary | ICD-10-CM | POA: Diagnosis not present

## 2015-09-22 NOTE — Patient Instructions (Signed)
Carlos [name]  09/22/2015     @PREFPERIOPPHARMACY @   Your procedure is scheduled on 09/30/2015.  Report to [facility] at 7:00 A.M.  Call this number if you have problems the morning of surgery:  [phone]   Remember:  Do not eat food or drink liquids after midnight.  Take these medicines the morning of surgery with A SIP OF WATER Klonopin, Gabapentin, Hydrocodone if needed   Do not wear jewelry, make-up or nail polish.  Do not wear lotions, powders, or perfumes, or deoderant.  Do not shave 48 hours prior to surgery.  Men may shave face and neck.  Do not bring valuables to the hospital.  [facility] is not responsible for any belongings or valuables.  Contacts, dentures or bridgework may not be worn into surgery.  Leave your suitcase in the car.  After surgery it may be brought to your room.  For patients admitted to the hospital, discharge time will be determined by your treatment team.  Patients discharged the day of surgery will not be allowed to drive home.    Please read over the following fact sheets that you were given. Anesthesia Post-op Instructions     PATIENT INSTRUCTIONS POST-ANESTHESIA  IMMEDIATELY FOLLOWING SURGERY:  Do not drive or operate machinery for the first twenty four hours after surgery.  Do not make any important decisions for twenty four hours after surgery or while taking narcotic pain medications or sedatives.  If you develop intractable nausea and vomiting or a severe headache please notify your doctor immediately.  FOLLOW-UP:  Please make an appointment with your surgeon as instructed. You do not need to follow up with anesthesia unless specifically instructed to do so.  WOUND CARE INSTRUCTIONS (if applicable):  Keep a dry clean dressing on the anesthesia/puncture wound site if there is drainage.  Once the wound has quit draining you may leave it open to air.  Generally you should leave the bandage intact for twenty four hours unless there is  drainage.  If the epidural site drains for more than 36-48 hours please call the anesthesia department.  QUESTIONS?:  Please feel free to call your physician or the hospital operator if you have any questions, and they will be happy to assist you.      Colonoscopy A colonoscopy is an exam to look at the entire large intestine (colon). This exam can help find problems such as tumors, polyps, inflammation, and areas of bleeding. The exam takes about 1 hour.  LET [facility] CARE PROVIDER KNOW ABOUT:   Any allergies you have.  All medicines you are taking, including vitamins, herbs, eye drops, creams, and over-the-counter medicines.  Previous problems you or members of your family have had with the use of anesthetics.  Any blood disorders you have.  Previous surgeries you have had.  Medical conditions you have. RISKS AND COMPLICATIONS  Generally, this is a safe procedure. However, as with any procedure, complications can occur. Possible complications include:  Bleeding.  Tearing or rupture of the colon wall.  Reaction to medicines given during the exam.  Infection (rare). BEFORE THE PROCEDURE   Ask your health care provider about changing or stopping your regular medicines.  You may be prescribed an oral bowel prep. This involves drinking a large amount of medicated liquid, starting the day before your procedure. The liquid will cause you to have multiple loose stools until your stool is almost clear or light green. This cleans out your colon in preparation  for the procedure.  Do not eat or drink anything else once you have started the bowel prep, unless your health care provider tells you it is safe to do so.  Arrange for someone to drive you home after the procedure. PROCEDURE   You will be given medicine to help you relax (sedative).  You will lie on your side with your knees bent.  A long, flexible tube with a light and camera on the end (colonoscope) will be inserted  through the rectum and into the colon. The camera sends video back to a computer screen as it moves through the colon. The colonoscope also releases carbon dioxide gas to inflate the colon. This helps your health care provider see the area better.  During the exam, your health care provider may take a small tissue sample (biopsy) to be examined under a microscope if any abnormalities are found.  The exam is finished when the entire colon has been viewed. AFTER THE PROCEDURE   Do not drive for 24 hours after the exam.  You may have a small amount of blood in your stool.  You may pass moderate amounts of gas and have mild abdominal cramping or bloating. This is caused by the gas used to inflate your colon during the exam.  Ask when your test results will be ready and how you will get your results. Make sure you get your test results.   This information is not intended to replace advice given to you by your health care provider. Make sure you discuss any questions you have with your health care provider.   Document Released: 12/23/1999 Document Revised: 10/15/2012 Document Reviewed: 09/01/2012 Elsevier Interactive Patient Education [employer].

## 2015-09-23 ENCOUNTER — Encounter (HOSPITAL_COMMUNITY): Payer: Self-pay

## 2015-09-23 ENCOUNTER — Encounter (HOSPITAL_COMMUNITY)
Admission: RE | Admit: 2015-09-23 | Discharge: 2015-09-23 | Disposition: A | Discharge status: Home / Self Care | Payer: Medicare Other | Source: Ambulatory Visit | Attending: Internal Medicine | Admitting: Internal Medicine

## 2015-09-23 DIAGNOSIS — Z01818 Encounter for other preprocedural examination: Secondary | ICD-10-CM | POA: Diagnosis not present

## 2015-09-23 HISTORY — DX: Malignant (primary) neoplasm, unspecified: C80.1

## 2015-09-23 LAB — BASIC METABOLIC PANEL
Anion gap: 7 (ref 5–15)
BUN: 10 mg/dL (ref 6–20)
CO2: 28 mmol/L (ref 22–32)
Calcium: 9.2 mg/dL (ref 8.9–10.3)
Chloride: 102 mmol/L (ref 101–111)
Creatinine, Ser: 0.8 mg/dL (ref 0.61–1.24)
GFR calc Af Amer: >60 mL/min (ref >60)
GLUCOSE: 95 mg/dL (ref 65–99)
POTASSIUM: 4.4 mmol/L (ref 3.5–5.1)
Sodium: 137 mmol/L (ref 135–145)

## 2015-09-23 LAB — CBC
HEMATOCRIT: 42.7 % (ref 39.0–52.0)
Hemoglobin: 14.8 g/dL (ref 13.0–17.0)
MCH: 31.5 pg (ref 26.0–34.0)
MCHC: 34.7 g/dL (ref 30.0–36.0)
MCV: 90.9 fL (ref 78.0–100.0)
PLATELETS: 367 10*3/uL (ref 150–400)
RBC: 4.7 MIL/uL (ref 4.22–5.81)
RDW: 14 % (ref 11.5–15.5)
WBC: 11.4 10*3/uL — AB (ref 4.0–10.5)

## 2015-09-30 ENCOUNTER — Ambulatory Visit (HOSPITAL_COMMUNITY)
Admission: RE | Admit: 2015-09-30 | Discharge: 2015-09-30 | Disposition: A | Discharge status: Home / Self Care | Payer: Medicare Other | Source: Ambulatory Visit | Attending: Internal Medicine | Admitting: Internal Medicine

## 2015-09-30 ENCOUNTER — Ambulatory Visit (HOSPITAL_COMMUNITY): Payer: Medicare Other | Admitting: Anesthesiology

## 2015-09-30 ENCOUNTER — Encounter (HOSPITAL_COMMUNITY)
Admission: RE | Disposition: A | Discharge status: Home / Self Care | Payer: Self-pay | Source: Ambulatory Visit | Attending: Internal Medicine

## 2015-09-30 DIAGNOSIS — Z1211 Encounter for screening for malignant neoplasm of colon: Secondary | ICD-10-CM | POA: Diagnosis not present

## 2015-09-30 DIAGNOSIS — Z79899 Other long term (current) drug therapy: Secondary | ICD-10-CM | POA: Insufficient documentation

## 2015-09-30 DIAGNOSIS — E785 Hyperlipidemia, unspecified: Secondary | ICD-10-CM | POA: Diagnosis not present

## 2015-09-30 DIAGNOSIS — K644 Residual hemorrhoidal skin tags: Secondary | ICD-10-CM | POA: Diagnosis not present

## 2015-09-30 DIAGNOSIS — D125 Benign neoplasm of sigmoid colon: Secondary | ICD-10-CM | POA: Diagnosis not present

## 2015-09-30 DIAGNOSIS — Z87891 Personal history of nicotine dependence: Secondary | ICD-10-CM | POA: Insufficient documentation

## 2015-09-30 HISTORY — PX: POLYPECTOMY: SHX5525

## 2015-09-30 HISTORY — PX: COLONOSCOPY WITH PROPOFOL: SHX5780

## 2015-09-30 SURGERY — COLONOSCOPY WITH PROPOFOL
Anesthesia: Monitor Anesthesia Care

## 2015-09-30 MED ORDER — MIDAZOLAM HCL 2 MG/2ML IJ SOLN
1.0000 mg | INTRAMUSCULAR | Status: DC | PRN
Start: 2015-09-30 — End: 2015-09-30
  Administered 2015-09-30 (×2): 2 mg via INTRAVENOUS

## 2015-09-30 MED ORDER — HYDROMORPHONE HCL 1 MG/ML IJ SOLN: 0.2500 mg | INTRAMUSCULAR | Status: DC | PRN

## 2015-09-30 MED ORDER — MIDAZOLAM HCL 2 MG/2ML IJ SOLN
INTRAMUSCULAR | Status: AC
  Filled 2015-09-30: qty 2

## 2015-09-30 MED ORDER — MIDAZOLAM HCL 5 MG/5ML IJ SOLN
INTRAMUSCULAR | Status: DC | PRN
  Administered 2015-09-30: 2 mg via INTRAVENOUS

## 2015-09-30 MED ORDER — PROPOFOL 500 MG/50ML IV EMUL
INTRAVENOUS | Status: DC | PRN
  Administered 2015-09-30: 11:00:00 via INTRAVENOUS
  Administered 2015-09-30: 100 ug/kg/min via INTRAVENOUS

## 2015-09-30 MED ORDER — LACTATED RINGERS IV SOLN
INTRAVENOUS | Status: DC
  Administered 2015-09-30: 08:00:00 via INTRAVENOUS

## 2015-09-30 MED ORDER — SODIUM CHLORIDE 0.9 % IV SOLN: INTRAVENOUS | Status: DC

## 2015-09-30 MED ORDER — FENTANYL CITRATE (PF) 100 MCG/2ML IJ SOLN
INTRAMUSCULAR | Status: AC
  Filled 2015-09-30: qty 2

## 2015-09-30 MED ORDER — FENTANYL CITRATE (PF) 100 MCG/2ML IJ SOLN
25.0000 ug | INTRAMUSCULAR | Status: AC | PRN
  Administered 2015-09-30 (×2): 25 ug via INTRAVENOUS

## 2015-09-30 MED ORDER — LIDOCAINE HCL (PF) 1 % IJ SOLN
INTRAMUSCULAR | Status: AC
Start: 2015-09-30 — End: 2015-09-30
  Filled 2015-09-30: qty 5

## 2015-09-30 NOTE — Discharge Instructions (Signed)
Resume usual medications and diet. No driving for 24 hours. Physician will call with biopsy results.   Colonoscopy, Care After Refer to this sheet in the next few weeks. These instructions provide you with information on caring for yourself after your procedure. Your health care provider may also give you more specific instructions. Your treatment has been planned according to current medical practices, but problems sometimes occur. Call your health care provider if you have any problems or questions after your procedure. WHAT TO EXPECT AFTER THE PROCEDURE  After your procedure, it is typical to have the following:  A small amount of blood in your stool.  Moderate amounts of gas and mild abdominal cramping or bloating. HOME CARE INSTRUCTIONS  Do not drive, operate machinery, or sign important documents for 24 hours.  You may shower and resume your regular physical activities, but move at a slower pace for the first 24 hours.  Take frequent rest periods for the first 24 hours.  Walk around or put a warm pack on your abdomen to help reduce abdominal cramping and bloating.  Drink enough fluids to keep your urine clear or pale yellow.  You may resume your normal diet as instructed by your health care provider. Avoid heavy or fried foods that are hard to digest.  Avoid drinking alcohol for 24 hours or as instructed by your health care provider.  Only take over-the-counter or prescription medicines as directed by your health care provider.  If a tissue sample (biopsy) was taken during your procedure:  Do not take aspirin or blood thinners for 7 days, or as instructed by your health care provider.  Do not drink alcohol for 7 days, or as instructed by your health care provider.  Eat soft foods for the first 24 hours. SEEK MEDICAL CARE IF: You have persistent spotting of blood in your stool 2-3 days after the procedure. SEEK IMMEDIATE MEDICAL CARE IF:  You have more than a small  spotting of blood in your stool.  You pass large blood clots in your stool.  Your abdomen is swollen (distended).  You have nausea or vomiting.  You have a fever.  You have increasing abdominal pain that is not relieved with medicine.   This information is not intended to replace advice given to you by your health care provider. Make sure you discuss any questions you have with your health care provider.   Document Released: 08/09/2003 Document Revised: 10/15/2012 Document Reviewed: 09/01/2012 Elsevier Interactive Patient Education 2016 Elsevier Inc.   Colon Polyps Polyps are lumps of extra tissue growing inside the body. Polyps can grow in the large intestine (colon). Most colon polyps are noncancerous (benign). However, some colon polyps can become cancerous over time. Polyps that are larger than a pea may be harmful. To be safe, caregivers remove and test all polyps. CAUSES  Polyps form when mutations in the genes cause your cells to grow and divide even though no more tissue is needed. RISK FACTORS There are a number of risk factors that can increase your chances of getting colon polyps. They include:  Being older than 50 years.  Family history of colon polyps or colon cancer.  Long-term colon diseases, such as colitis or Crohn disease.  Being overweight.  Smoking.  Being inactive.  Drinking too much alcohol. SYMPTOMS  Most small polyps do not cause symptoms. If symptoms are present, they may include:  Blood in the stool. The stool may look dark red or black.  Constipation or diarrhea that  lasts longer than 1 week. DIAGNOSIS People often do not know they have polyps until their caregiver finds them during a regular checkup. Your caregiver can use 4 tests to check for polyps:  Digital rectal exam. The caregiver wears gloves and feels inside the rectum. This test would find polyps only in the rectum.  Barium enema. The caregiver puts a liquid called barium into  your rectum before taking X-rays of your colon. Barium makes your colon look white. Polyps are dark, so they are easy to see in the X-ray pictures.  Sigmoidoscopy. A thin, flexible tube (sigmoidoscope) is placed into your rectum. The sigmoidoscope has a light and tiny camera in it. The caregiver uses the sigmoidoscope to look at the last third of your colon.  Colonoscopy. This test is like sigmoidoscopy, but the caregiver looks at the entire colon. This is the most common method for finding and removing polyps. TREATMENT  Any polyps will be removed during a sigmoidoscopy or colonoscopy. The polyps are then tested for cancer. PREVENTION  To help lower your risk of getting more colon polyps:  Eat plenty of fruits and vegetables. Avoid eating fatty foods.  Do not smoke.  Avoid drinking alcohol.  Exercise every day.  Lose weight if recommended by your caregiver.  Eat plenty of calcium and folate. Foods that are rich in calcium include milk, cheese, and broccoli. Foods that are rich in folate include chickpeas, kidney beans, and spinach. HOME CARE INSTRUCTIONS Keep all follow-up appointments as directed by your caregiver. You may need periodic exams to check for polyps. SEEK MEDICAL CARE IF: You notice bleeding during a bowel movement.   This information is not intended to replace advice given to you by your health care provider. Make sure you discuss any questions you have with your health care provider.   Document Released: 09/21/2003 Document Revised: 01/15/2014 Document Reviewed: 03/06/2011 Elsevier Interactive Patient Education [employer].

## 2015-09-30 NOTE — Anesthesia Postprocedure Evaluation (Signed)
Anesthesia Post Note  Patient: Carlos [name]  Procedure(s) Performed: Procedure(s) (LRB): COLONOSCOPY WITH PROPOFOL (N/A) POLYPECTOMY  Patient location during evaluation: PACU Anesthesia Type: MAC Level of consciousness: awake, oriented and patient cooperative Pain management: pain level controlled Vital Signs Assessment: post-procedure vital signs reviewed and stable Respiratory status: spontaneous breathing, nonlabored ventilation and respiratory function stable Cardiovascular status: blood pressure returned to baseline Postop Assessment: no signs of nausea or vomiting Anesthetic complications: no    Last Vitals:  Vitals:   09/30/15 1057 09/30/15 1100  BP:    Pulse: 80 78  Resp: 16   Temp: (P) 36.6 C     Last Pain:  Vitals:   09/30/15 1022  PainSc: 0-No pain                 Briar Sword J

## 2015-09-30 NOTE — Anesthesia Preprocedure Evaluation (Signed)
Anesthesia Evaluation  Patient identified by MRN, date of birth, ID band Patient awake    Reviewed: Allergy & Precautions, H&P , NPO status , Patient's Chart, lab work & pertinent test results  Airway Mallampati: II  TM Distance: >3 FB Neck ROM: Full    Dental  (+) Partial Upper   Pulmonary former smoker,  Smoking Status: Former Smoker - 40 pack years    Quit Smoking: 07/07/08     Pulmonary exam normal breath sounds clear to auscultation       Cardiovascular negative cardio ROS Normal cardiovascular exam Rhythm:Regular Rate:Normal     Neuro/Psych Anxiety negative neurological ROS     GI/Hepatic negative GI ROS, Neg liver ROS,   Endo/Other  negative endocrine ROS  Renal/GU negative Renal ROS  negative genitourinary   Musculoskeletal negative musculoskeletal ROS (+)   Abdominal   Peds negative pediatric ROS (+)  Hematology negative hematology ROS (+)   Anesthesia Other Findings   Reproductive/Obstetrics negative OB ROS                             Anesthesia Physical Anesthesia Plan  ASA: II  Anesthesia Plan: MAC   Post-op Pain Management:    Induction: Intravenous  Airway Management Planned: Simple Face Mask  Additional Equipment:   Intra-op Plan:   Post-operative Plan:   Informed Consent: I have reviewed the patients History and Physical, chart, labs and discussed the procedure including the risks, benefits and alternatives for the proposed anesthesia with the patient or authorized representative who has indicated his/her understanding and acceptance.     Plan Discussed with:   Anesthesia Plan Comments:         Anesthesia Quick Evaluation

## 2015-09-30 NOTE — Op Note (Signed)
[facility] Patient Name: Carlos [name] Procedure Date: 09/30/2015 10:22 AM MRN: [id number] Date of Birth: 13-Nov-1955 Attending MD: Hildred Laser , MD CSN: [id number] Age: 60 Admit Type: Outpatient Procedure:                Colonoscopy Indications:              Screening for colorectal malignant neoplasm Providers:                Hildred Laser, MD, Lurline Del, RN, Purcell Nails. [place],                            Technician Referring MD:             Delphina Cahill, MD Medicines:                Propofol per Anesthesia Complications:            No immediate complications. Estimated Blood Loss:     Estimated blood loss was minimal. Procedure:                Pre-Anesthesia Assessment:                           - Prior to the procedure, a History and Physical                            was performed, and patient medications and                            allergies were reviewed. The patient's tolerance of                            previous anesthesia was also reviewed. The risks                            and benefits of the procedure and the sedation                            options and risks were discussed with the patient.                            All questions were answered, and informed consent                            was obtained. Prior Anticoagulants: The patient has                            taken no previous anticoagulant or antiplatelet                            agents. ASA Grade Assessment: II - A patient with                            mild systemic disease. After reviewing the risks  and benefits, the patient was deemed in                            satisfactory condition to undergo the procedure.                           After obtaining informed consent, the colonoscope                            was passed under direct vision. Throughout the                            procedure, the patient's blood pressure, pulse, and   oxygen saturations were monitored continuously. The                            [id number] scope was introduced through                            the anus and advanced to the the cecum, identified                            by appendiceal orifice and ileocecal valve. The                            colonoscopy was performed without difficulty. The                            patient tolerated the procedure well. The quality                            of the bowel preparation was fair. The ileocecal                            valve, appendiceal orifice, and rectum were                            photographed. Scope In: 10:29:43 AM Scope Out: 10:49:28 AM Scope Withdrawal Time: 0 hours 15 minutes 11 seconds  Total Procedure Duration: 0 hours 19 minutes 45 seconds  Findings:      The perianal and digital rectal examinations were normal.      A 4 mm polyp was found in the mid sigmoid colon. The polyp was sessile.       The polyp was removed with a cold snare. Resection and retrieval were       complete.      External hemorrhoids were found during retroflexion. The hemorrhoids       were small. Impression:               - Preparation of the colon was fair.                           - One 4 mm polyp in the mid sigmoid colon, removed  with a cold snare. Resected and retrieved.                           - External hemorrhoids. Moderate Sedation:      Per Anesthesia Care Recommendation:           - Patient has a contact number available for                            emergencies. The signs and symptoms of potential                            delayed complications were discussed with the                            patient. Return to normal activities tomorrow.                            Written discharge instructions were provided to the                            patient.                           - High fiber diet today.                           - Continue  present medications.                           - Await pathology results.                           - Repeat colonoscopy for surveillance based on                            pathology results. Procedure Code(s):        --- Professional ---                           [phone], Colonoscopy, flexible; with removal of                            tumor(s), polyp(s), or other lesion(s) by snare                            technique Diagnosis Code(s):        --- Professional ---                           Z12.11, Encounter for screening for malignant                            neoplasm of colon                           K64.4, Residual hemorrhoidal skin tags  D12.5, Benign neoplasm of sigmoid colon CPT copyright 2016 American Medical Association. All rights reserved. The codes documented in this report are preliminary and upon coder review may  be revised to meet current compliance requirements. Hildred Laser, MD Hildred Laser, MD 09/30/2015 10:56:22 AM This report has been signed electronically. Number of Addenda: 0

## 2015-09-30 NOTE — H&P (Signed)
Carlos [name] is an 60 y.o. male.   Chief Complaint: Patient is here for colonoscopy. HPI: Patient is 60 year old Caucasian male who is here for screening colonoscopy. He denies change in bowel habits or rectal bleeding. Last month he developed left-sided abdominal pain was treated with antibiotic. Stop antibiotic after one week as he developed skin rash and swelling and numbness in his hands. Symptoms have resolved. Describes his pain to be burning he has not had any pain over a week. He has good appetite and denies anorexia weight loss. Last colonoscopy was 10 years ago Family's recent negative for CRC.  Past Medical History:  Diagnosis Date  . Anxiety   . Cancer [facility])    Skin cancer left shoulder  . Chronic low back pain   . Dermatofibrosarcoma protubera of right shoulder   . Hyperlipidemia     Past Surgical History:  Procedure Laterality Date  . APPENDECTOMY    . APPLICATION OF A-CELL OF EXTREMITY Right 07/09/2012   Procedure: PLACEMENT OF A-CELL TO UPPER EXTREMITY/PLACEMENT OF VAC;  Surgeon: Theodoro Kos, DO;  Location: [facility];  Service: Plastics;  Laterality: Right;  . EXCISION DERMATOFIBROCARCOMA PROTUBERAN RIGHT SHOULDER  2 - 3 WKS AGO  . INGUINAL HERNIA REPAIR Left 1999  . KNEE ARTHROSCOPY Left 2012    Family History  Problem Relation Age of Onset  . Dementia Mother   . Cancer Father    Social History:  reports that he quit smoking about 7 years ago. His smoking use included Cigarettes. He has a 40.00 pack-year smoking history. He has never used smokeless tobacco. He reports that he does not drink alcohol or use drugs.  Allergies:  Allergies  Allergen Reactions  . Ciprofloxacin Swelling    Swelling in hands, numbness in arms and joint pain    Medications Prior to Admission  Medication Sig Dispense Refill  . Ascorbic Acid (VITAMIN C) 1000 MG tablet Take 1,000 mg by mouth daily.    . clonazePAM (KLONOPIN) 1 MG tablet Take 1 mg by mouth 2 (two)  times daily.     Marland Kitchen HYDROcodone-acetaminophen (NORCO/VICODIN) 5-325 MG per tablet Take 1 tablet by mouth every 6 (six) hours as needed for moderate pain.     . Melatonin 10 MG CAPS Take 1 capsule by mouth daily.    . simvastatin (ZOCOR) 40 MG tablet Take 1 tablet by mouth daily.      No results found for this or any previous visit (from the past 48 hour(s)). No results found.  ROS  Blood pressure 122/72, temperature 98 F (36.7 C), resp. rate 12, SpO2 99 %. Physical Exam  Constitutional: He appears well-developed and well-nourished.  HENT:  Mouth/Throat: Oropharynx is clear and moist.  Eyes: Conjunctivae are normal. No scleral icterus.  Neck: No thyromegaly present.  Cardiovascular: Normal rate, regular rhythm and normal heart sounds.   No murmur heard. Respiratory: Effort normal and breath sounds normal.  GI: Soft. He exhibits no distension and no mass. There is no tenderness.  Musculoskeletal: He exhibits no edema.  Lymphadenopathy:    He has no cervical adenopathy.  Neurological: He is alert.  Skin: Skin is warm and dry.     Assessment/Plan Average risk screening colonoscopy.  Hildred Laser, MD 09/30/2015, 10:16 AM

## 2015-09-30 NOTE — Transfer of Care (Signed)
Immediate Anesthesia Transfer of Care Note  Patient: Jeanella Flattery  Procedure(s) Performed: Procedure(s) with comments: COLONOSCOPY WITH PROPOFOL (N/A) - 830 POLYPECTOMY - sigmoid  Patient Location: PACU  Anesthesia Type:MAC  Level of Consciousness: awake and patient cooperative  Airway & Oxygen Therapy: Patient Spontanous Breathing and Patient connected to face mask oxygen  Post-op Assessment: Report given to RN, Post -op Vital signs reviewed and stable and Patient moving all extremities  Post vital signs: Reviewed and stable  Last Vitals:  Vitals:   09/30/15 0720 09/30/15 0730  BP: 122/72   Resp: (!) 25 12  Temp: 36.7 C     Last Pain:  Vitals:   09/30/15 1022  PainSc: 0-No pain         Complications: No apparent anesthesia complications

## 2015-10-05 ENCOUNTER — Encounter (HOSPITAL_COMMUNITY): Payer: Self-pay | Admitting: Internal Medicine

## 2015-10-10 DIAGNOSIS — G4700 Insomnia, unspecified: Secondary | ICD-10-CM | POA: Diagnosis not present

## 2015-10-10 DIAGNOSIS — M5417 Radiculopathy, lumbosacral region: Secondary | ICD-10-CM | POA: Diagnosis not present

## 2015-10-10 DIAGNOSIS — M47817 Spondylosis without myelopathy or radiculopathy, lumbosacral region: Secondary | ICD-10-CM | POA: Diagnosis not present

## 2015-10-10 DIAGNOSIS — G894 Chronic pain syndrome: Secondary | ICD-10-CM | POA: Diagnosis not present

## 2015-11-14 ENCOUNTER — Telehealth: Payer: Self-pay | Admitting: Diagnostic Neuroimaging

## 2015-11-14 NOTE — Telephone Encounter (Signed)
Spoke with patient who stated he used to see Dr Francesco Runner for pain, but "he went bankrupt". He then went to [facility], but it wasn't helpful. He stated he has a "bad left leg, joint  disease in his hips". He inquired if he could get Dr Leta Baptist to "give him a shot".  Informed patient Dr Leta Baptist does not give injections. Advised he contact his PCP Dr Nevada Crane for a referral to a clinic that gives injections for pain.  Patient verbalized understanding, appreciation. He stated he would call his PCP.

## 2015-11-14 NOTE — Telephone Encounter (Signed)
Patient is calling  for an appointment with Dr. Leta Baptist regarding his lower back. He was referred from out office to a Pain Management Center in 2015 for this problem but he states that has not helped. Please call the patient and discuss.

## 2015-11-15 DIAGNOSIS — M5416 Radiculopathy, lumbar region: Secondary | ICD-10-CM | POA: Diagnosis not present

## 2015-11-15 DIAGNOSIS — G5792 Unspecified mononeuropathy of left lower limb: Secondary | ICD-10-CM | POA: Diagnosis not present

## 2015-11-15 DIAGNOSIS — Z6824 Body mass index (BMI) 24.0-24.9, adult: Secondary | ICD-10-CM | POA: Diagnosis not present

## 2016-01-05 DIAGNOSIS — E782 Mixed hyperlipidemia: Secondary | ICD-10-CM | POA: Diagnosis not present

## 2016-01-06 DIAGNOSIS — Z720 Tobacco use: Secondary | ICD-10-CM | POA: Diagnosis not present

## 2016-01-06 DIAGNOSIS — G5792 Unspecified mononeuropathy of left lower limb: Secondary | ICD-10-CM | POA: Diagnosis not present

## 2016-01-06 DIAGNOSIS — E782 Mixed hyperlipidemia: Secondary | ICD-10-CM | POA: Diagnosis not present

## 2016-01-06 DIAGNOSIS — Z0001 Encounter for general adult medical examination with abnormal findings: Secondary | ICD-10-CM | POA: Diagnosis not present

## 2016-01-06 DIAGNOSIS — F5101 Primary insomnia: Secondary | ICD-10-CM | POA: Diagnosis not present

## 2016-01-06 DIAGNOSIS — M6281 Muscle weakness (generalized): Secondary | ICD-10-CM | POA: Diagnosis not present

## 2016-03-14 DIAGNOSIS — M5410 Radiculopathy, site unspecified: Secondary | ICD-10-CM | POA: Diagnosis not present

## 2016-04-04 DIAGNOSIS — H81319 Aural vertigo, unspecified ear: Secondary | ICD-10-CM | POA: Diagnosis not present

## 2016-04-04 DIAGNOSIS — Z79899 Other long term (current) drug therapy: Secondary | ICD-10-CM | POA: Diagnosis not present

## 2016-04-04 DIAGNOSIS — M5416 Radiculopathy, lumbar region: Secondary | ICD-10-CM | POA: Diagnosis not present

## 2016-04-04 DIAGNOSIS — G5792 Unspecified mononeuropathy of left lower limb: Secondary | ICD-10-CM | POA: Diagnosis not present

## 2016-04-05 DIAGNOSIS — M5410 Radiculopathy, site unspecified: Secondary | ICD-10-CM | POA: Diagnosis not present

## 2016-04-05 DIAGNOSIS — R030 Elevated blood-pressure reading, without diagnosis of hypertension: Secondary | ICD-10-CM | POA: Diagnosis not present

## 2016-06-01 DIAGNOSIS — Z125 Encounter for screening for malignant neoplasm of prostate: Secondary | ICD-10-CM | POA: Diagnosis not present

## 2016-06-01 DIAGNOSIS — E782 Mixed hyperlipidemia: Secondary | ICD-10-CM | POA: Diagnosis not present

## 2016-06-05 DIAGNOSIS — E782 Mixed hyperlipidemia: Secondary | ICD-10-CM | POA: Diagnosis not present

## 2016-06-05 DIAGNOSIS — G5792 Unspecified mononeuropathy of left lower limb: Secondary | ICD-10-CM | POA: Diagnosis not present

## 2016-06-05 DIAGNOSIS — M5010 Cervical disc disorder with radiculopathy, unspecified cervical region: Secondary | ICD-10-CM | POA: Diagnosis not present

## 2016-06-05 DIAGNOSIS — F5101 Primary insomnia: Secondary | ICD-10-CM | POA: Diagnosis not present

## 2016-06-14 ENCOUNTER — Emergency Department (HOSPITAL_COMMUNITY)
Admission: EM | Admit: 2016-06-14 | Discharge: 2016-06-14 | Discharge status: Against Medical Advice | Payer: Medicare Other | Attending: Emergency Medicine | Admitting: Emergency Medicine

## 2016-06-14 ENCOUNTER — Encounter (HOSPITAL_COMMUNITY): Payer: Self-pay | Admitting: Emergency Medicine

## 2016-06-14 DIAGNOSIS — Z85828 Personal history of other malignant neoplasm of skin: Secondary | ICD-10-CM | POA: Insufficient documentation

## 2016-06-14 DIAGNOSIS — F419 Anxiety disorder, unspecified: Secondary | ICD-10-CM | POA: Diagnosis not present

## 2016-06-14 DIAGNOSIS — R0602 Shortness of breath: Secondary | ICD-10-CM | POA: Diagnosis present

## 2016-06-14 DIAGNOSIS — F17210 Nicotine dependence, cigarettes, uncomplicated: Secondary | ICD-10-CM | POA: Insufficient documentation

## 2016-06-14 DIAGNOSIS — R220 Localized swelling, mass and lump, head: Secondary | ICD-10-CM | POA: Diagnosis not present

## 2016-06-14 DIAGNOSIS — M25552 Pain in left hip: Secondary | ICD-10-CM | POA: Diagnosis not present

## 2016-06-14 DIAGNOSIS — Z79899 Other long term (current) drug therapy: Secondary | ICD-10-CM | POA: Insufficient documentation

## 2016-06-14 NOTE — ED Provider Notes (Signed)
[facility] DEPT Provider Note   CSN: [id number] Arrival date & time: 06/14/16  1126     History   Chief Complaint Chief Complaint  Patient presents with  . Facial Swelling    HPI Carlos [name] is a 61 y.o. male presenting with shortness of breath.  Patient states that he woke up feeling fine this morning, and when he walked out to the mailbox, he felt like it got hard to catch his breath. He denies chest pain at the time.  He states that this shortness of breath got better on its own, and he is not short of breath now. He denies fever, chills, chest pain, nausea, vomiting, abdominal pain, or urinary symptoms. He states that his mouth is dry, and says he's concerned that he has some numbness around his mouth. He states that his doctor changed his anxiety medicines recently from [place] to [place].  Additionally he reports left hip pain present since 2014. This hip pain is causing numbness and tingling in his right hand. He has been seen by multiple providers for this pain in the past.   Per [facility], patient takes hydrocodone acetaminophen, and his most recent prescription for Klonopin was dispensed May 18 with a one month supply.  HPI  Past Medical History:  Diagnosis Date  . Anxiety   . Cancer [facility])    Skin cancer left shoulder  . Chronic low back pain   . Dermatofibrosarcoma protubera of right shoulder   . Hyperlipidemia     Patient Active Problem List   Diagnosis Date Noted  . Special screening for malignant neoplasms, colon 08/22/2015  . Skin cancer of trunk 07/09/2012  . Lumbago 01/22/2012    Past Surgical History:  Procedure Laterality Date  . APPENDECTOMY    . APPLICATION OF A-CELL OF EXTREMITY Right 07/09/2012   Procedure: PLACEMENT OF A-CELL TO UPPER EXTREMITY/PLACEMENT OF VAC;  Surgeon: Theodoro Kos, DO;  Location: [facility];  Service: Plastics;  Laterality: Right;  . COLONOSCOPY WITH PROPOFOL N/A 09/30/2015   Procedure: COLONOSCOPY WITH  PROPOFOL;  Surgeon: Rogene Houston, MD;  [facility];  Service: Endoscopy;  Laterality: N/A;  830  . EXCISION DERMATOFIBROCARCOMA PROTUBERAN RIGHT SHOULDER  2 - 3 WKS AGO  . INGUINAL HERNIA REPAIR Left 1999  . KNEE ARTHROSCOPY Left 2012  . POLYPECTOMY  09/30/2015   Procedure: POLYPECTOMY;  Surgeon: Rogene Houston, MD;  [facility];  Service: Endoscopy;;  sigmoid       Home Medications    Prior to Admission medications   Medication Sig Start Date End Date Taking? Authorizing Provider  Ascorbic Acid (VITAMIN C) 1000 MG tablet Take 1,000 mg by mouth daily.    [provider]  clonazePAM (KLONOPIN) 1 MG tablet Take 1 mg by mouth 2 (two) times daily.     [provider]  HYDROcodone-acetaminophen (NORCO/VICODIN) 5-325 MG per tablet Take 1 tablet by mouth every 6 (six) hours as needed for moderate pain.  10/10/12   [provider]  Melatonin 10 MG CAPS Take 1 capsule by mouth daily.    [provider]  simvastatin (ZOCOR) 40 MG tablet Take 1 tablet by mouth daily.    [provider]    Family History Family History  Problem Relation Age of Onset  . Dementia Mother   . Cancer Father     Social History Social History  Substance Use Topics  . Smoking status: Current Every Day Smoker  Packs/day: 1.00    Years: 40.00    Types: Cigarettes  . Smokeless tobacco: Never Used  . Alcohol use No     Allergies   Ciprofloxacin   Review of Systems Review of Systems  Constitutional: Negative for chills, diaphoresis and fever.  HENT: Negative for rhinorrhea, sore throat and trouble swallowing.        Dry mouth  Eyes: Negative for visual disturbance.  Respiratory: Positive for shortness of breath (resolved spontaneously). Negative for cough and chest tightness.   Cardiovascular: Negative for chest pain, palpitations and leg swelling.  Gastrointestinal: Negative for abdominal pain, constipation, diarrhea, nausea and  vomiting.  Musculoskeletal: Positive for arthralgias (L hip pain). Negative for back pain, joint swelling, neck pain and neck stiffness.  Skin: Negative for rash.  Neurological: Positive for numbness. Negative for speech difficulty and light-headedness.  Psychiatric/Behavioral: The patient is not nervous/anxious.      Physical Exam Updated Vital Signs BP (!) 153/69 (BP Location: Left Arm)   Pulse 78   Temp 98 F (36.7 C) (Oral)   Resp 18   Ht 5\' 6"  (1.676 m)   Wt 72.6 kg (160 lb)   SpO2 99%   BMI 25.82 kg/m   Physical Exam  Constitutional: He is oriented to person, place, and time. He appears well-developed and well-nourished. No distress.  HENT:  Head: Normocephalic and atraumatic.  Mouth/Throat: Oropharynx is clear and moist and mucous membranes are normal.  Eyes: Conjunctivae and EOM are normal. Pupils are equal, round, and reactive to light.  Neck: Normal range of motion. Neck supple.  Cardiovascular: Normal rate, regular rhythm, normal heart sounds and intact distal pulses.   Pulmonary/Chest: Effort normal and breath sounds normal.  Abdominal: Soft. There is no tenderness.  Musculoskeletal: Normal range of motion.  Neurological: He is alert and oriented to person, place, and time. No cranial nerve deficit or sensory deficit. Coordination normal.  Skin: Skin is warm and dry. No rash noted. He is not diaphoretic.     ED Treatments / Results  Labs (all labs ordered are listed, but only abnormal results are displayed) Labs Reviewed  BASIC METABOLIC PANEL  CBC    EKG  EKG Interpretation None       Radiology No results found.  Procedures Procedures (including critical care time)  Medications Ordered in ED Medications - No data to display   Initial Impression / Assessment and Plan / ED Course  I have reviewed the triage vital signs and the nursing notes.  Pertinent labs & imaging results that were available during my care of the patient were reviewed by  me and considered in my medical decision making (see chart for details).     Initial impressions shows diagnoses most likely related to anxiety. Patient shortness of breath had quick onset, and resolved spontaneously. Per pt, he has had recent change in his anxiety medicine. His hip pain is likely not related to his right hand numbness. On physical exam no sensory loss was noted. Patient's dry mouth was improved with a glass of water. Planned to order EKG, CBC, and BMP to rule out other causes of shortness of breath, but patient left AMA stating that he didn't feel like he needed to be here, and his family  made him come. This happened prior to tests.   Final Clinical Impressions(s) / ED Diagnoses   Final diagnoses:  Anxiety  Left hip pain    New Prescriptions Discharge Medication List as of 06/14/2016 12:21 PM  Franchot Heidelberg, PA-C 06/14/16 1238    Noemi Chapel, MD 06/14/16 1515

## 2016-06-14 NOTE — ED Triage Notes (Signed)
Pt reports having sensation of facial swelling, throat closing, and bottom lip swelling for about 3 weeks.  States he walked to the mailbox this morning and it became worse.

## 2016-06-14 NOTE — ED Notes (Signed)
Pt states he feels better and is going home.  States he did not want to be here to begin with, but his family made him.  Left with family in no distress.  Ambulated out of department.

## 2016-06-17 ENCOUNTER — Emergency Department (HOSPITAL_COMMUNITY): Payer: Medicare Other

## 2016-06-17 ENCOUNTER — Emergency Department (HOSPITAL_COMMUNITY)
Admission: EM | Admit: 2016-06-17 | Discharge: 2016-06-17 | Disposition: A | Discharge status: Home / Self Care | Payer: Medicare Other | Attending: Emergency Medicine | Admitting: Emergency Medicine

## 2016-06-17 ENCOUNTER — Encounter (HOSPITAL_COMMUNITY): Payer: Self-pay | Admitting: *Deleted

## 2016-06-17 DIAGNOSIS — F17210 Nicotine dependence, cigarettes, uncomplicated: Secondary | ICD-10-CM | POA: Insufficient documentation

## 2016-06-17 DIAGNOSIS — R0602 Shortness of breath: Secondary | ICD-10-CM | POA: Diagnosis not present

## 2016-06-17 DIAGNOSIS — R4182 Altered mental status, unspecified: Secondary | ICD-10-CM | POA: Diagnosis present

## 2016-06-17 DIAGNOSIS — Z79899 Other long term (current) drug therapy: Secondary | ICD-10-CM | POA: Insufficient documentation

## 2016-06-17 DIAGNOSIS — Z85828 Personal history of other malignant neoplasm of skin: Secondary | ICD-10-CM | POA: Insufficient documentation

## 2016-06-17 DIAGNOSIS — R410 Disorientation, unspecified: Secondary | ICD-10-CM | POA: Diagnosis not present

## 2016-06-17 LAB — COMPREHENSIVE METABOLIC PANEL
ALBUMIN: 4.3 g/dL (ref 3.5–5.0)
ALT: 21 U/L (ref 17–63)
ANION GAP: 10 (ref 5–15)
AST: 21 U/L (ref 15–41)
Alkaline Phosphatase: 49 U/L (ref 38–126)
BILIRUBIN TOTAL: 0.7 mg/dL (ref 0.3–1.2)
BUN: 6 mg/dL (ref 6–20)
CO2: 23 mmol/L (ref 22–32)
Calcium: 8.8 mg/dL — ABNORMAL LOW (ref 8.9–10.3)
Chloride: 99 mmol/L — ABNORMAL LOW (ref 101–111)
Creatinine, Ser: 0.7 mg/dL (ref 0.61–1.24)
GFR calc Af Amer: >60 mL/min (ref >60)
GFR calc non Af Amer: >60 mL/min (ref >60)
GLUCOSE: 105 mg/dL — AB (ref 65–99)
POTASSIUM: 3.8 mmol/L (ref 3.5–5.1)
SODIUM: 132 mmol/L — AB (ref 135–145)
TOTAL PROTEIN: 7 g/dL (ref 6.5–8.1)

## 2016-06-17 LAB — CBC WITH DIFFERENTIAL/PLATELET
BASOS PCT: 0 %
Basophils Absolute: 0.1 10*3/uL (ref 0.0–0.1)
EOS ABS: 0.1 10*3/uL (ref 0.0–0.7)
Eosinophils Relative: 1 %
HCT: 42.6 % (ref 39.0–52.0)
Hemoglobin: 14.8 g/dL (ref 13.0–17.0)
Lymphocytes Relative: 13 %
Lymphs Abs: 1.7 10*3/uL (ref 0.7–4.0)
MCH: 31.4 pg (ref 26.0–34.0)
MCHC: 34.7 g/dL (ref 30.0–36.0)
MCV: 90.3 fL (ref 78.0–100.0)
MONO ABS: 0.8 10*3/uL (ref 0.1–1.0)
MONOS PCT: 6 %
Neutro Abs: 11 10*3/uL — ABNORMAL HIGH (ref 1.7–7.7)
Neutrophils Relative %: 80 %
PLATELETS: 342 10*3/uL (ref 150–400)
RBC: 4.72 MIL/uL (ref 4.22–5.81)
RDW: 13.6 % (ref 11.5–15.5)
WBC: 13.6 10*3/uL — ABNORMAL HIGH (ref 4.0–10.5)

## 2016-06-17 LAB — TROPONIN I: Troponin I: <0.03 ng/mL (ref <0.03)

## 2016-06-17 NOTE — ED Provider Notes (Signed)
[facility] DEPT Provider Note   CSN: [id number] Arrival date & time: 06/17/16  1019     History   Chief Complaint Chief Complaint  Patient presents with  . Shortness of Breath  . Altered Mental Status    HPI Carlos [name] is a 61 y.o. male.  The history is provided by the patient. No language interpreter was used.  Shortness of Breath  This is a new problem. The problem occurs intermittently.The current episode started more than 1 week ago. The problem has been gradually worsening. Pertinent negatives include no fever, no orthopnea and no chest pain. The problem's precipitants include medical treatment. He has tried nothing for the symptoms. He has had no prior hospitalizations. He has had prior ED visits. He has had no prior ICU admissions. Associated medical issues do not include asthma.  Altered Mental Status    Pt reports he has been experiencing some confusion.   Pt reports on Thursday he was seeing bugs all over himself.  Pt reports he also feels short of breath.  Pt reports he had an anxiety attack on Thursday.  Pt is on medication for anxiety.  Past Medical History:  Diagnosis Date  . Anxiety   . Cancer [facility])    Skin cancer left shoulder  . Chronic low back pain   . Dermatofibrosarcoma protubera of right shoulder   . Hyperlipidemia     Patient Active Problem List   Diagnosis Date Noted  . Special screening for malignant neoplasms, colon 08/22/2015  . Skin cancer of trunk 07/09/2012  . Lumbago 01/22/2012    Past Surgical History:  Procedure Laterality Date  . APPENDECTOMY    . APPLICATION OF A-CELL OF EXTREMITY Right 07/09/2012   Procedure: PLACEMENT OF A-CELL TO UPPER EXTREMITY/PLACEMENT OF VAC;  Surgeon: Theodoro Kos, DO;  Location: [facility];  Service: Plastics;  Laterality: Right;  . COLONOSCOPY WITH PROPOFOL N/A 09/30/2015   Procedure: COLONOSCOPY WITH PROPOFOL;  Surgeon: Rogene Houston, MD;  [facility];  Service:  Endoscopy;  Laterality: N/A;  830  . EXCISION DERMATOFIBROCARCOMA PROTUBERAN RIGHT SHOULDER  2 - 3 WKS AGO  . INGUINAL HERNIA REPAIR Left 1999  . KNEE ARTHROSCOPY Left 2012  . POLYPECTOMY  09/30/2015   Procedure: POLYPECTOMY;  Surgeon: Rogene Houston, MD;  [facility];  Service: Endoscopy;;  sigmoid       Home Medications    Prior to Admission medications   Medication Sig Start Date End Date Taking? Authorizing Provider  busPIRone (BUSPAR) 5 MG tablet Take 1 tablet by mouth 2 (two) times daily. 06/05/16  Yes [provider]  HYDROcodone-acetaminophen (NORCO/VICODIN) 5-325 MG per tablet Take 1 tablet by mouth every 6 (six) hours as needed for moderate pain.  10/10/12  Yes [provider]  simvastatin (ZOCOR) 40 MG tablet Take 1 tablet by mouth daily.   Yes [provider]  clonazePAM (KLONOPIN) 1 MG tablet Take 1 mg by mouth 2 (two) times daily.     [provider]  Melatonin 10 MG CAPS Take 1 capsule by mouth daily.    [provider]    Family History Family History  Problem Relation Age of Onset  . Dementia Mother   . Cancer Father     Social History Social History  Substance Use Topics  . Smoking status: Current Every Day Smoker    Packs/day: 1.00    Years: 40.00    Types: Cigarettes  . Smokeless tobacco: Never  Used  . Alcohol use No     Allergies   Ciprofloxacin   Review of Systems Review of Systems  Constitutional: Negative for fever.  Respiratory: Positive for shortness of breath.   Cardiovascular: Negative for chest pain and orthopnea.  All other systems reviewed and are negative.    Physical Exam Updated Vital Signs BP (!) 153/82   Pulse 64   Temp 98.2 F (36.8 C) (Oral)   Resp 16   Ht 5\' 6"  (1.676 m)   Wt 72.6 kg (160 lb)   SpO2 97%   BMI 25.82 kg/m   Physical Exam  Constitutional: He appears well-developed and well-nourished.  HENT:  Head: Normocephalic and atraumatic.  Right Ear:  External ear normal.  Left Ear: External ear normal.  Nose: Nose normal.  Eyes: Conjunctivae are normal.  Neck: Neck supple.  Cardiovascular: Normal rate, regular rhythm and normal heart sounds.   No murmur heard. Pulmonary/Chest: Effort normal and breath sounds normal. No respiratory distress.  Abdominal: Soft. There is no tenderness.  Musculoskeletal: Normal range of motion. He exhibits no edema.  Neurological: He is alert.  Skin: Skin is warm and dry.  Psychiatric: He has a normal mood and affect.  Nursing note and vitals reviewed.    ED Treatments / Results  Labs (all labs ordered are listed, but only abnormal results are displayed) Labs Reviewed  CBC WITH DIFFERENTIAL/PLATELET - Abnormal; Notable for the following:       Result Value   WBC 13.6 (*)    Neutro Abs 11.0 (*)    All other components within normal limits  COMPREHENSIVE METABOLIC PANEL - Abnormal; Notable for the following:    Sodium 132 (*)    Chloride 99 (*)    Glucose, Bld 105 (*)    Calcium 8.8 (*)    All other components within normal limits  TROPONIN I    EKG  EKG Interpretation  Date/Time:  Sunday June 17 2016 10:27:41 EDT Ventricular Rate:  75 PR Interval:    QRS Duration: 81 QT Interval:  404 QTC Calculation: 452 R Axis:   63 Text Interpretation:  Normal sinus rhythm No significant change since last tracing Confirmed by Merrily Pew [phone]) on 06/17/2016 10:30:12 AM       Radiology Dg Chest 2 View  Result Date: 06/17/2016 CLINICAL DATA:  Shortness of Breath EXAM: CHEST  2 VIEW COMPARISON:  August 24, 2004 FINDINGS: Lungs are clear. Heart size and pulmonary vascularity are normal. No adenopathy. There is aortic atherosclerosis. No bone lesions. IMPRESSION: Aortic atherosclerosis.  No edema or consolidation. Electronically Signed   By: Lowella Grip III M.D.   On: 06/17/2016 11:54   Ct Head Wo Contrast  Result Date: 06/17/2016 CLINICAL DATA:  Right arm numbness.  Confusion. EXAM: CT  HEAD WITHOUT CONTRAST TECHNIQUE: Contiguous axial images were obtained from the base of the skull through the vertex without intravenous contrast. COMPARISON:  Brain MRI April 26, 2003 FINDINGS: Brain: The ventricles are normal in size and configuration. Slight frontal atrophy bilaterally is stable. There is no intracranial mass, hemorrhage, extra-axial fluid collection, or midline shift. Gray-white compartments appear normal. No acute infarct evident. Vascular: No demonstrable hyperdense vessel. There is no appreciable vascular calcification. Skull: The bony calvarium appears intact. Sinuses/Orbits: There is mild mucosal thickening in several ethmoid air cells bilaterally. Visualized paranasal sinuses elsewhere are clear. Visualized orbits appear symmetric bilaterally. Other: Mastoid air cells are clear. IMPRESSION: Mild frontal atrophy bilaterally. Ventricles normal in size and configuration. There  is no intracranial mass hemorrhage, or extra-axial fluid collection. Gray-white compartments appear normal. There is mild ethmoid sinus disease bilaterally. Electronically Signed   By: Lowella Grip III M.D.   On: 06/17/2016 12:05    Procedures Procedures (including critical care time)  Medications Ordered in ED Medications - No data to display   Initial Impression / Assessment and Plan / ED Course  I have reviewed the triage vital signs and the nursing notes.  Pertinent labs & imaging results that were available during my care of the patient were reviewed by me and considered in my medical decision making (see chart for details).     Pt advised of results.  Pt advised to see his MD for reevaluation of medications.  I suspect symptoms are anxiety/stress related.   Final Clinical Impressions(s) / ED Diagnoses   Final diagnoses:  Confusion    New Prescriptions New Prescriptions   No medications on file  An After Visit Summary was printed and given to the patient.   Fransico Meadow,  PA-C 06/17/16 1353    Fransico Meadow, PA-C 06/17/16 1354    Fransico Meadow, [place] 06/17/16 1355    Merrily Pew, MD 06/17/16 [phone]

## 2016-06-17 NOTE — ED Notes (Signed)
Pt ambulatory to bathroom without difficulty.  

## 2016-06-17 NOTE — ED Triage Notes (Addendum)
Pt c/o SOB that has been going on for a few days. Pt also c/o right arm numbness and confusion that has been going on for unknown time. Pt reports he knows the arm pain happens at bedtime, but he is unsure when it exactly started. Pt was seen at ED a couple days ago for SOB and was dx with anxiety.

## 2016-06-17 NOTE — Discharge Instructions (Signed)
See Dr. Nevada Crane for recheck this week.  He may need to adjust your medications

## 2016-06-18 ENCOUNTER — Telehealth: Payer: Self-pay | Admitting: *Deleted

## 2016-06-18 NOTE — Telephone Encounter (Signed)
Call placed to patient ex wife, Mariann Laster.   States that she understands, but would like to have MD keep him in mind in the future.

## 2016-06-18 NOTE — Telephone Encounter (Signed)
Received call from patient e-wife [facility].   Reports that patient is currently seeing Dr. Allyn Kenner, but is interested in changing to [facility] since the rest of family are current patient's. States that she would like to have him seen by Dr. Buelah Manis.   Patient is currently on disability for chronic back pain.   States that she would like patient to be seen ASAP, and she will be bringing patient to [facility].  MD please advise.

## 2016-06-18 NOTE — Telephone Encounter (Signed)
No, I do not think that is for the best.

## 2016-06-21 DIAGNOSIS — H9319 Tinnitus, unspecified ear: Secondary | ICD-10-CM | POA: Diagnosis not present

## 2016-06-21 DIAGNOSIS — R42 Dizziness and giddiness: Secondary | ICD-10-CM | POA: Diagnosis not present

## 2016-06-29 DIAGNOSIS — M545 Low back pain: Secondary | ICD-10-CM | POA: Diagnosis not present

## 2016-06-29 DIAGNOSIS — M542 Cervicalgia: Secondary | ICD-10-CM | POA: Diagnosis not present

## 2016-06-29 DIAGNOSIS — R030 Elevated blood-pressure reading, without diagnosis of hypertension: Secondary | ICD-10-CM | POA: Diagnosis not present

## 2016-07-17 ENCOUNTER — Other Ambulatory Visit: Payer: Self-pay | Admitting: Neurosurgery

## 2016-07-17 DIAGNOSIS — G8929 Other chronic pain: Secondary | ICD-10-CM

## 2016-07-17 DIAGNOSIS — M542 Cervicalgia: Secondary | ICD-10-CM

## 2016-07-17 DIAGNOSIS — M545 Low back pain: Principal | ICD-10-CM

## 2016-07-19 DIAGNOSIS — G629 Polyneuropathy, unspecified: Secondary | ICD-10-CM | POA: Diagnosis not present

## 2016-08-01 ENCOUNTER — Other Ambulatory Visit: Payer: Medicare Other

## 2016-08-07 ENCOUNTER — Other Ambulatory Visit: Payer: Self-pay | Admitting: Neurosurgery

## 2016-08-07 DIAGNOSIS — G8929 Other chronic pain: Secondary | ICD-10-CM

## 2016-08-07 DIAGNOSIS — M545 Low back pain: Principal | ICD-10-CM

## 2016-08-07 DIAGNOSIS — M542 Cervicalgia: Secondary | ICD-10-CM

## 2016-08-08 ENCOUNTER — Ambulatory Visit
Admission: RE | Admit: 2016-08-08 | Discharge: 2016-08-08 | Disposition: A | Discharge status: Home / Self Care | Payer: Medicare Other | Source: Ambulatory Visit | Attending: Neurosurgery | Admitting: Neurosurgery

## 2016-08-08 DIAGNOSIS — M542 Cervicalgia: Secondary | ICD-10-CM

## 2016-08-08 DIAGNOSIS — G8929 Other chronic pain: Secondary | ICD-10-CM

## 2016-08-08 DIAGNOSIS — M545 Low back pain: Principal | ICD-10-CM

## 2016-08-08 DIAGNOSIS — M5126 Other intervertebral disc displacement, lumbar region: Secondary | ICD-10-CM | POA: Diagnosis not present

## 2016-08-08 DIAGNOSIS — M5021 Other cervical disc displacement,  high cervical region: Secondary | ICD-10-CM | POA: Diagnosis not present

## 2016-08-08 MED ORDER — IOPAMIDOL (ISOVUE-M 300) INJECTION 61%
10.0000 mL | Freq: Once | INTRAMUSCULAR | Status: AC | PRN
  Administered 2016-08-08: 10 mL via INTRATHECAL

## 2016-08-08 MED ORDER — DIAZEPAM 5 MG PO TABS
10.0000 mg | ORAL_TABLET | Freq: Once | ORAL | Status: AC
  Administered 2016-08-08: 10 mg via ORAL

## 2016-08-08 MED ORDER — ONDANSETRON HCL 4 MG/2ML IJ SOLN: 4.0000 mg | Freq: Four times a day (QID) | INTRAMUSCULAR | Status: DC | PRN

## 2016-08-08 NOTE — Discharge Instructions (Signed)
Myelogram Discharge Instructions  1. Go home and rest quietly for the next 24 hours.  It is important to lie flat for the next 24 hours.  Get up only to go to the restroom.  You may lie in the bed or on a couch on your back, your stomach, your left side or your right side.  You may have one pillow under your head.  You may have pillows between your knees while you are on your side or under your knees while you are on your back.  2. DO NOT drive today.  Recline the seat as far back as it will go, while still wearing your seat belt, on the way home.  3. You may get up to go to the bathroom as needed.  You may sit up for 10 minutes to eat.  You may resume your normal diet and medications unless otherwise indicated.  Drink lots of extra fluids today and tomorrow.  4. The incidence of headache, nausea, or vomiting is about 5% (one in 20 patients).  If you develop a headache, lie flat and drink plenty of fluids until the headache goes away.  Caffeinated beverages may be helpful.  If you develop severe nausea and vomiting or a headache that does not go away with flat bed rest, call [phone].  5. You may resume normal activities after your 24 hours of bed rest is over; however, do not exert yourself strongly or do any heavy lifting tomorrow. If when you get up you have a headache when standing, go back to bed and force fluids for another 24 hours.  6. Call your physician for a follow-up appointment.  The results of your myelogram will be sent directly to your physician by the following day.  7. If you have any questions or if complications develop after you arrive home, please call [phone].  Discharge instructions have been explained to the patient.  The patient, or the person responsible for the patient, fully understands these instructions.       May resume Sertraline and Buspar on Aug. 2, 2018, after 10:30 am,

## 2016-08-16 DIAGNOSIS — Z720 Tobacco use: Secondary | ICD-10-CM | POA: Diagnosis not present

## 2016-08-16 DIAGNOSIS — G5792 Unspecified mononeuropathy of left lower limb: Secondary | ICD-10-CM | POA: Diagnosis not present

## 2016-08-16 DIAGNOSIS — R0602 Shortness of breath: Secondary | ICD-10-CM | POA: Diagnosis not present

## 2016-08-21 DIAGNOSIS — M545 Low back pain: Secondary | ICD-10-CM | POA: Diagnosis not present

## 2016-08-21 DIAGNOSIS — M4712 Other spondylosis with myelopathy, cervical region: Secondary | ICD-10-CM | POA: Diagnosis not present

## 2016-08-21 DIAGNOSIS — M47816 Spondylosis without myelopathy or radiculopathy, lumbar region: Secondary | ICD-10-CM | POA: Diagnosis not present

## 2016-08-21 DIAGNOSIS — R030 Elevated blood-pressure reading, without diagnosis of hypertension: Secondary | ICD-10-CM | POA: Diagnosis not present

## 2016-12-04 DIAGNOSIS — R030 Elevated blood-pressure reading, without diagnosis of hypertension: Secondary | ICD-10-CM | POA: Diagnosis not present

## 2016-12-04 DIAGNOSIS — M545 Low back pain: Secondary | ICD-10-CM | POA: Diagnosis not present

## 2016-12-06 DIAGNOSIS — E782 Mixed hyperlipidemia: Secondary | ICD-10-CM | POA: Diagnosis not present

## 2016-12-10 DIAGNOSIS — G4700 Insomnia, unspecified: Secondary | ICD-10-CM | POA: Diagnosis not present

## 2016-12-10 DIAGNOSIS — G5792 Unspecified mononeuropathy of left lower limb: Secondary | ICD-10-CM | POA: Diagnosis not present

## 2016-12-24 DIAGNOSIS — M47816 Spondylosis without myelopathy or radiculopathy, lumbar region: Secondary | ICD-10-CM | POA: Diagnosis not present

## 2017-04-08 DIAGNOSIS — M5410 Radiculopathy, site unspecified: Secondary | ICD-10-CM | POA: Diagnosis not present

## 2017-04-08 DIAGNOSIS — F5101 Primary insomnia: Secondary | ICD-10-CM | POA: Diagnosis not present

## 2017-04-08 DIAGNOSIS — M5010 Cervical disc disorder with radiculopathy, unspecified cervical region: Secondary | ICD-10-CM | POA: Diagnosis not present

## 2017-04-08 DIAGNOSIS — Z720 Tobacco use: Secondary | ICD-10-CM | POA: Diagnosis not present

## 2017-04-08 DIAGNOSIS — M542 Cervicalgia: Secondary | ICD-10-CM | POA: Diagnosis not present

## 2017-06-11 DIAGNOSIS — E782 Mixed hyperlipidemia: Secondary | ICD-10-CM | POA: Diagnosis not present

## 2017-06-14 DIAGNOSIS — Z720 Tobacco use: Secondary | ICD-10-CM | POA: Diagnosis not present

## 2017-06-14 DIAGNOSIS — E782 Mixed hyperlipidemia: Secondary | ICD-10-CM | POA: Diagnosis not present

## 2017-06-14 DIAGNOSIS — G9009 Other idiopathic peripheral autonomic neuropathy: Secondary | ICD-10-CM | POA: Diagnosis not present

## 2017-06-14 DIAGNOSIS — M5010 Cervical disc disorder with radiculopathy, unspecified cervical region: Secondary | ICD-10-CM | POA: Diagnosis not present

## 2017-06-14 DIAGNOSIS — F5101 Primary insomnia: Secondary | ICD-10-CM | POA: Diagnosis not present

## 2017-10-28 DIAGNOSIS — Z23 Encounter for immunization: Secondary | ICD-10-CM | POA: Diagnosis not present

## 2017-12-24 DIAGNOSIS — M5410 Radiculopathy, site unspecified: Secondary | ICD-10-CM | POA: Diagnosis not present

## 2017-12-24 DIAGNOSIS — J069 Acute upper respiratory infection, unspecified: Secondary | ICD-10-CM | POA: Diagnosis not present

## 2017-12-24 DIAGNOSIS — M542 Cervicalgia: Secondary | ICD-10-CM | POA: Diagnosis not present

## 2018-01-31 DIAGNOSIS — D72829 Elevated white blood cell count, unspecified: Secondary | ICD-10-CM | POA: Diagnosis not present

## 2018-01-31 DIAGNOSIS — E782 Mixed hyperlipidemia: Secondary | ICD-10-CM | POA: Diagnosis not present

## 2018-02-03 ENCOUNTER — Other Ambulatory Visit (HOSPITAL_COMMUNITY): Payer: Self-pay | Admitting: Internal Medicine

## 2018-02-03 ENCOUNTER — Other Ambulatory Visit: Payer: Self-pay | Admitting: Internal Medicine

## 2018-02-03 DIAGNOSIS — R7301 Impaired fasting glucose: Secondary | ICD-10-CM | POA: Diagnosis not present

## 2018-02-03 DIAGNOSIS — F17200 Nicotine dependence, unspecified, uncomplicated: Secondary | ICD-10-CM

## 2018-02-03 DIAGNOSIS — G4700 Insomnia, unspecified: Secondary | ICD-10-CM | POA: Diagnosis not present

## 2018-02-03 DIAGNOSIS — G5792 Unspecified mononeuropathy of left lower limb: Secondary | ICD-10-CM | POA: Diagnosis not present

## 2018-02-03 DIAGNOSIS — E782 Mixed hyperlipidemia: Secondary | ICD-10-CM | POA: Diagnosis not present

## 2018-02-03 DIAGNOSIS — M5416 Radiculopathy, lumbar region: Secondary | ICD-10-CM | POA: Diagnosis not present

## 2018-02-04 ENCOUNTER — Other Ambulatory Visit (HOSPITAL_COMMUNITY): Payer: Self-pay | Admitting: Respiratory Therapy

## 2018-02-04 DIAGNOSIS — R0602 Shortness of breath: Secondary | ICD-10-CM

## 2018-02-04 DIAGNOSIS — R0600 Dyspnea, unspecified: Secondary | ICD-10-CM

## 2018-02-06 ENCOUNTER — Ambulatory Visit (HOSPITAL_COMMUNITY): Admission: RE | Admit: 2018-02-06 | Payer: Medicare Other | Source: Ambulatory Visit

## 2018-02-12 ENCOUNTER — Other Ambulatory Visit (HOSPITAL_COMMUNITY): Payer: Self-pay | Admitting: Internal Medicine

## 2018-02-12 ENCOUNTER — Ambulatory Visit (HOSPITAL_COMMUNITY)
Admission: RE | Admit: 2018-02-12 | Discharge: 2018-02-12 | Disposition: A | Discharge status: Home / Self Care | Payer: Medicare Other | Source: Ambulatory Visit | Attending: Internal Medicine | Admitting: Internal Medicine

## 2018-02-12 DIAGNOSIS — Z136 Encounter for screening for cardiovascular disorders: Secondary | ICD-10-CM | POA: Diagnosis not present

## 2018-02-12 DIAGNOSIS — Z122 Encounter for screening for malignant neoplasm of respiratory organs: Secondary | ICD-10-CM | POA: Diagnosis not present

## 2018-02-12 DIAGNOSIS — F17200 Nicotine dependence, unspecified, uncomplicated: Secondary | ICD-10-CM

## 2018-02-26 ENCOUNTER — Ambulatory Visit (HOSPITAL_COMMUNITY)
Admission: RE | Admit: 2018-02-26 | Discharge: 2018-02-26 | Disposition: A | Discharge status: Home / Self Care | Payer: Medicare Other | Source: Ambulatory Visit | Attending: Internal Medicine | Admitting: Internal Medicine

## 2018-02-26 DIAGNOSIS — R0602 Shortness of breath: Secondary | ICD-10-CM | POA: Diagnosis not present

## 2018-02-26 DIAGNOSIS — R0600 Dyspnea, unspecified: Secondary | ICD-10-CM | POA: Insufficient documentation

## 2018-02-26 LAB — PULMONARY FUNCTION TEST
DL/VA % pred: 60 %
DL/VA: 2.58 ml/min/mmHg/L
DLCO UNC % PRED: 53 %
DLCO UNC: 12.78 ml/min/mmHg
FEF 25-75 PRE: 1.53 L/s
FEF 25-75 Post: 1.88 L/sec
FEF2575-%Change-Post: 23 %
FEF2575-%PRED-PRE: 62 %
FEF2575-%Pred-Post: 76 %
FEV1-%Change-Post: 3 %
FEV1-%PRED-POST: 75 %
FEV1-%PRED-PRE: 73 %
FEV1-PRE: 2.2 L
FEV1-Post: 2.27 L
FEV1FVC-%CHANGE-POST: 3 %
FEV1FVC-%Pred-Pre: 98 %
FEV6-%CHANGE-POST: 0 %
FEV6-%PRED-POST: 78 %
FEV6-%PRED-PRE: 77 %
FEV6-PRE: 2.95 L
FEV6-Post: 2.97 L
FEV6FVC-%CHANGE-POST: 0 %
FEV6FVC-%PRED-PRE: 104 %
FEV6FVC-%Pred-Post: 105 %
FVC-%CHANGE-POST: 0 %
FVC-%Pred-Post: 73 %
FVC-%Pred-Pre: 73 %
FVC-Post: 2.97 L
FVC-Pre: 2.97 L
POST FEV6/FVC RATIO: 100 %
Post FEV1/FVC ratio: 76 %
Pre FEV1/FVC ratio: 74 %
Pre FEV6/FVC Ratio: 99 %
RV % PRED: 123 %
RV: 2.58 L
TLC % PRED: 90 %
TLC: 5.6 L

## 2018-02-26 MED ORDER — ALBUTEROL SULFATE (2.5 MG/3ML) 0.083% IN NEBU
2.5000 mg | INHALATION_SOLUTION | Freq: Once | RESPIRATORY_TRACT | Status: AC
  Administered 2018-02-26: 2.5 mg via RESPIRATORY_TRACT

## 2018-06-26 DIAGNOSIS — D225 Melanocytic nevi of trunk: Secondary | ICD-10-CM | POA: Diagnosis not present

## 2018-06-26 DIAGNOSIS — D485 Neoplasm of uncertain behavior of skin: Secondary | ICD-10-CM | POA: Diagnosis not present

## 2018-06-26 DIAGNOSIS — L820 Inflamed seborrheic keratosis: Secondary | ICD-10-CM | POA: Diagnosis not present

## 2018-06-26 DIAGNOSIS — Z85828 Personal history of other malignant neoplasm of skin: Secondary | ICD-10-CM | POA: Diagnosis not present

## 2018-06-26 DIAGNOSIS — Z08 Encounter for follow-up examination after completed treatment for malignant neoplasm: Secondary | ICD-10-CM | POA: Diagnosis not present

## 2018-07-11 IMAGING — DX DG CHEST 2V
2 series · 2 of 2 positions shown · non-contrast
Comparison: August 24, 2004

CLINICAL DATA: Shortness of Breath

EXAM:
CHEST  2 VIEW

[chest pa]
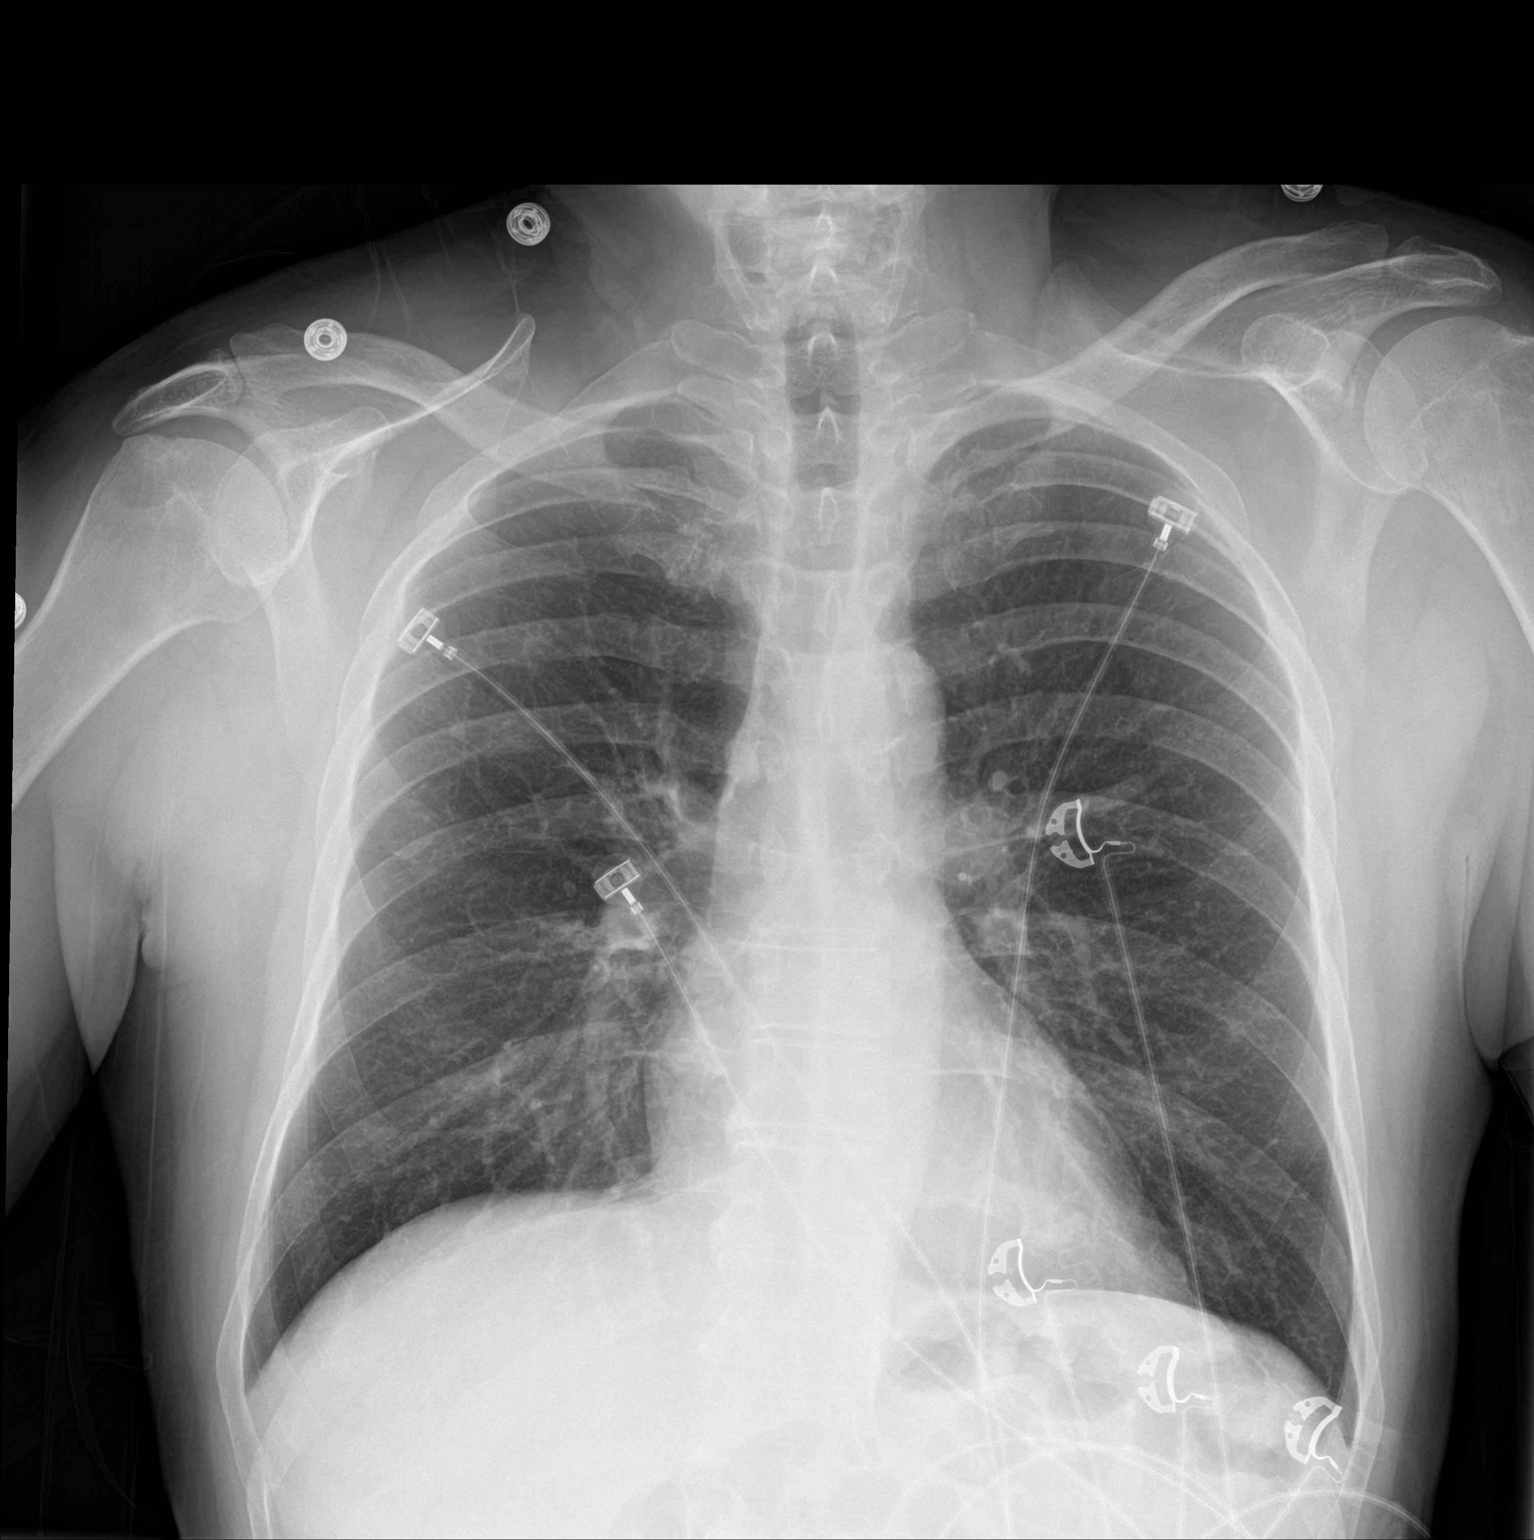

[chest lat]
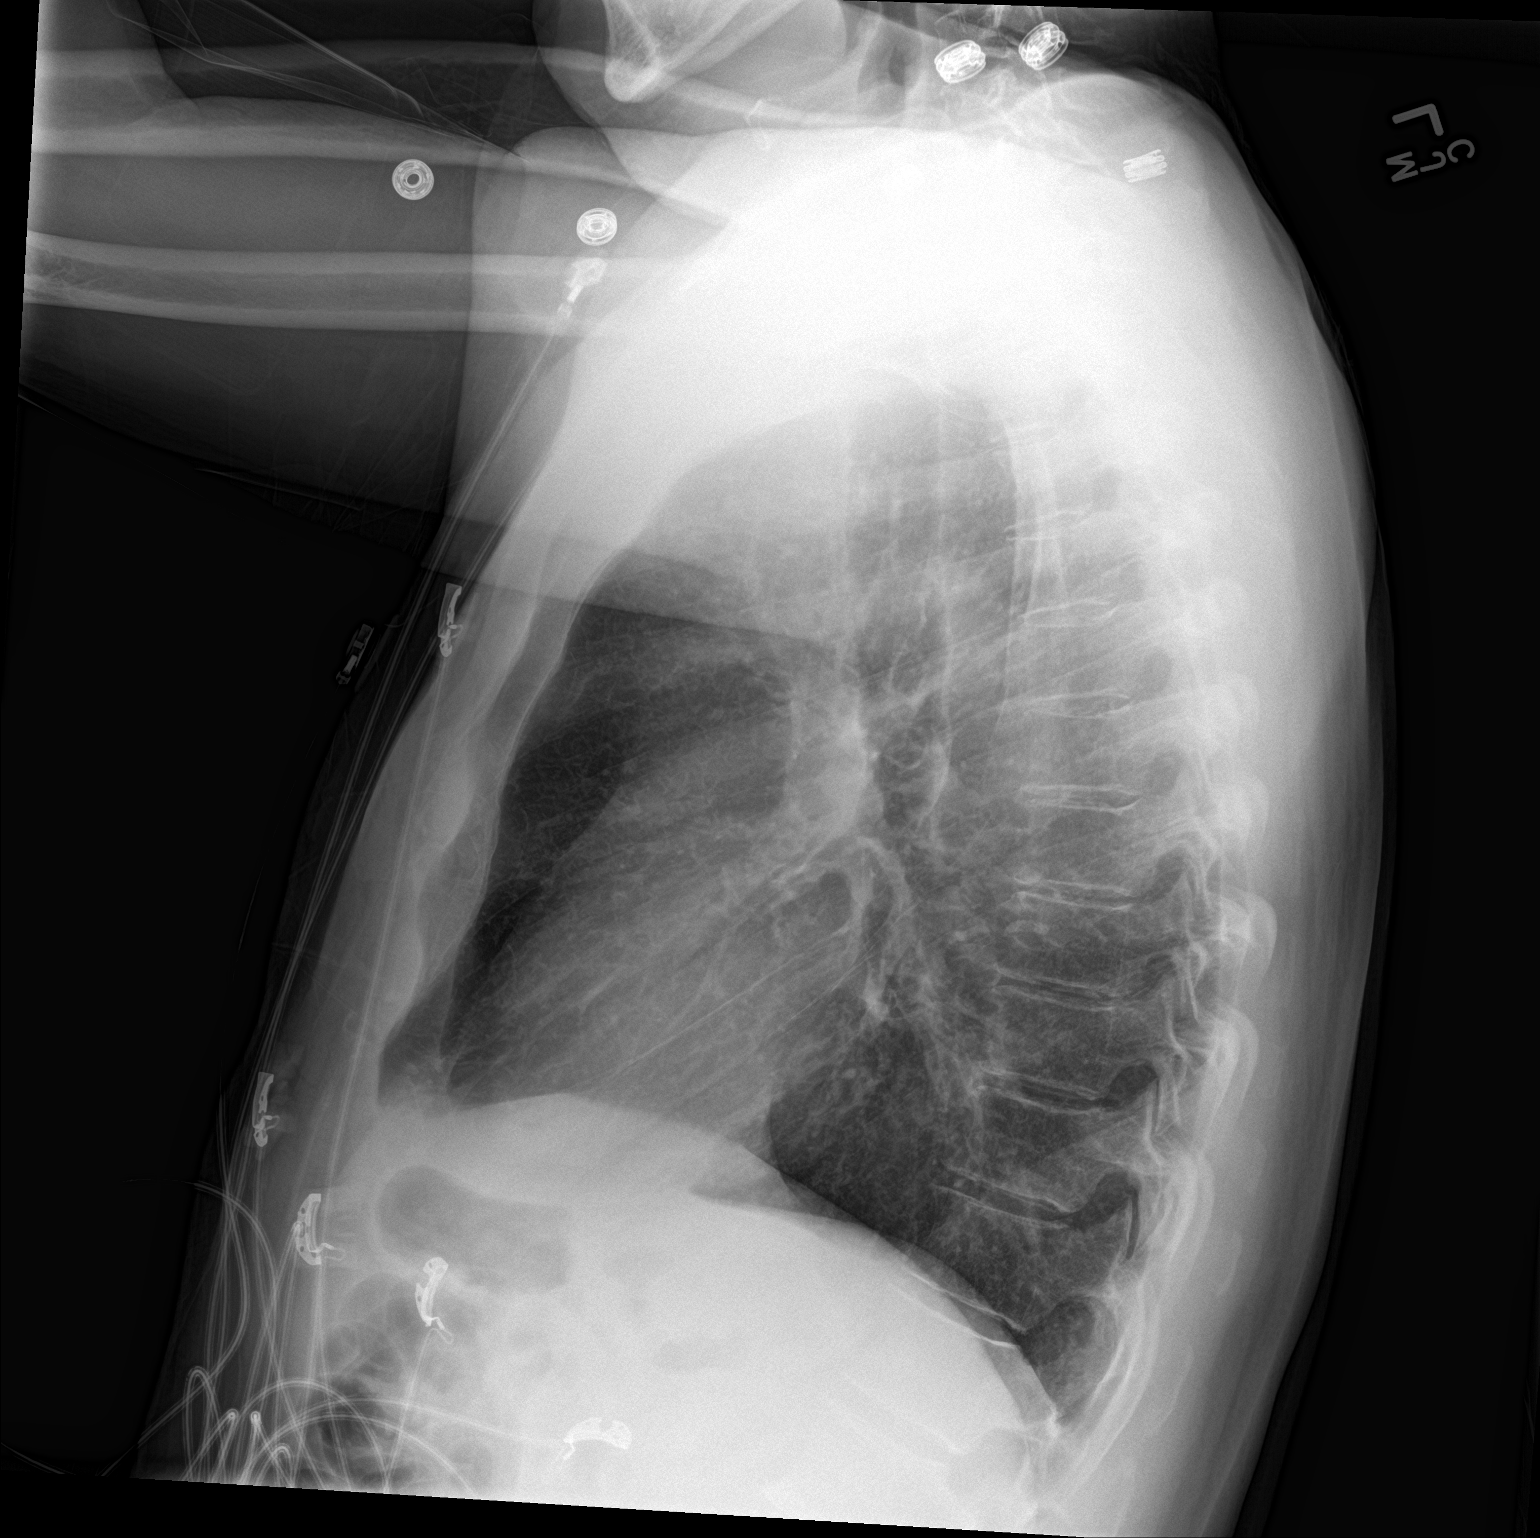

[2 of 2 positions shown; findings below may reference images not displayed]

FINDINGS: Lungs are clear. Heart size and pulmonary vascularity are normal. No
adenopathy. There is aortic atherosclerosis. No bone lesions.
IMPRESSION: Aortic atherosclerosis.  No edema or consolidation.

## 2018-08-11 DIAGNOSIS — E782 Mixed hyperlipidemia: Secondary | ICD-10-CM | POA: Diagnosis not present

## 2018-08-11 DIAGNOSIS — R7301 Impaired fasting glucose: Secondary | ICD-10-CM | POA: Diagnosis not present

## 2018-08-14 DIAGNOSIS — E782 Mixed hyperlipidemia: Secondary | ICD-10-CM | POA: Diagnosis not present

## 2018-08-14 DIAGNOSIS — Z0000 Encounter for general adult medical examination without abnormal findings: Secondary | ICD-10-CM | POA: Diagnosis not present

## 2018-08-14 DIAGNOSIS — Z720 Tobacco use: Secondary | ICD-10-CM | POA: Diagnosis not present

## 2018-08-14 DIAGNOSIS — G4700 Insomnia, unspecified: Secondary | ICD-10-CM | POA: Diagnosis not present

## 2018-08-26 DIAGNOSIS — H524 Presbyopia: Secondary | ICD-10-CM | POA: Diagnosis not present

## 2018-08-26 DIAGNOSIS — H2513 Age-related nuclear cataract, bilateral: Secondary | ICD-10-CM | POA: Diagnosis not present

## 2018-09-01 IMAGING — CR DG MYELOGRAPHY LUMBAR INJ MULTI REGION
13 of 16 series · 13 of 16 positions shown · non-contrast
Comparison: none

CLINICAL DATA: Low back and bilateral leg pain. Neck and arm pain
and numbness.
TECHNIQUE: Contiguous axial images were obtained through the Cervical and
Lumbar spine after the intrathecal infusion of infusion. Coronal and
sagittal reconstructions were obtained of the axial image sets.

[vasc standard (1 of 11)]
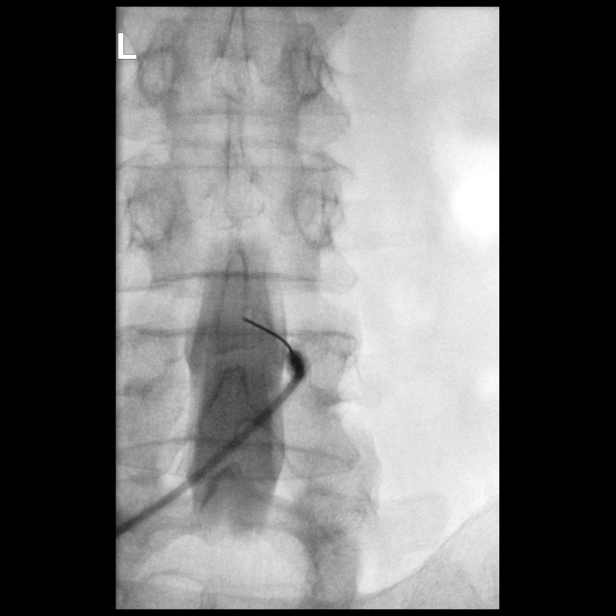

[w lumbar spine lat]
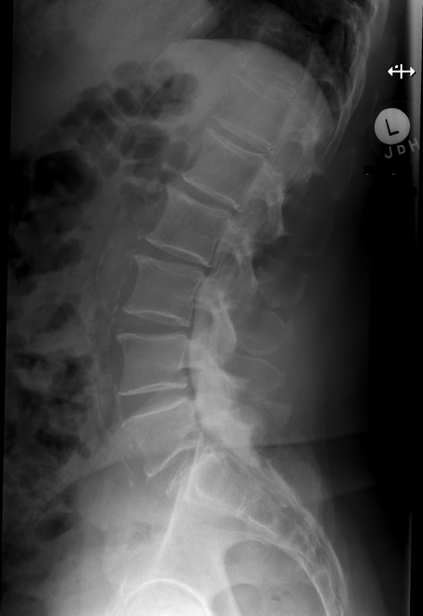

[vasc standard (2 of 11)]
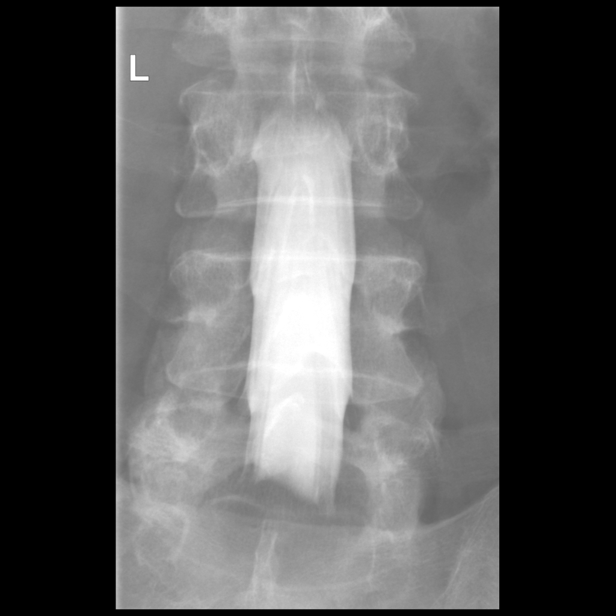

[w lumbar spine extension]
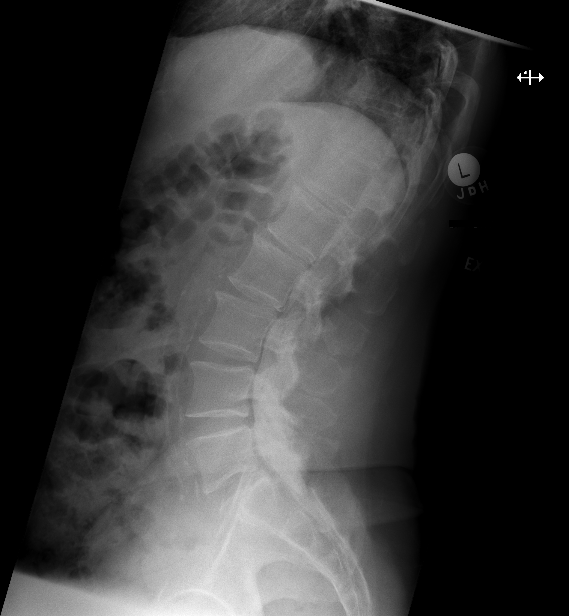

[vasc standard (3 of 11)]
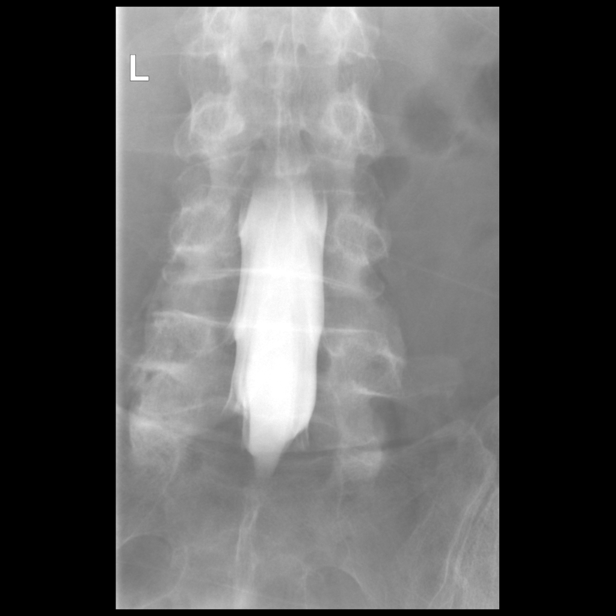

[vasc standard (4 of 11)]
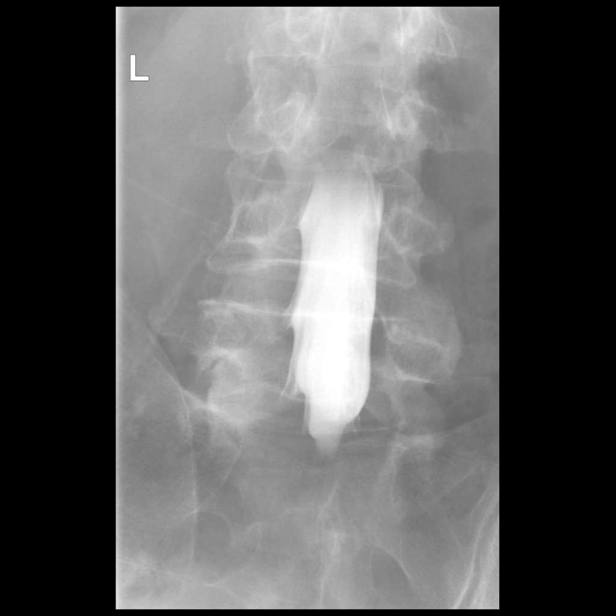

[vasc standard (5 of 11)]
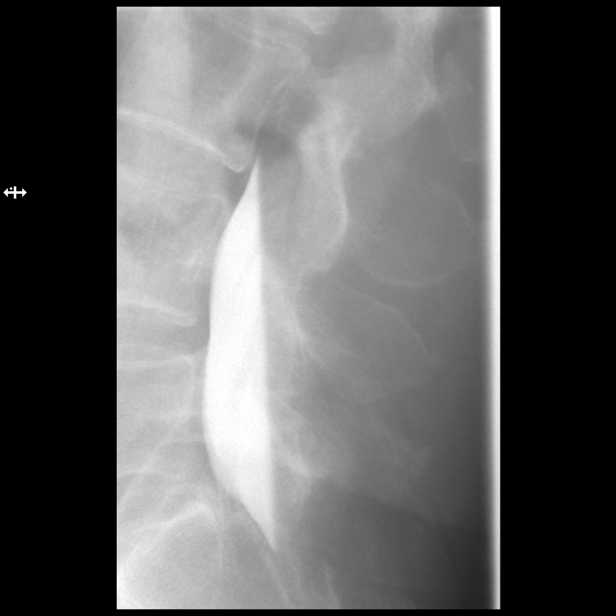

[vasc standard (6 of 11)]
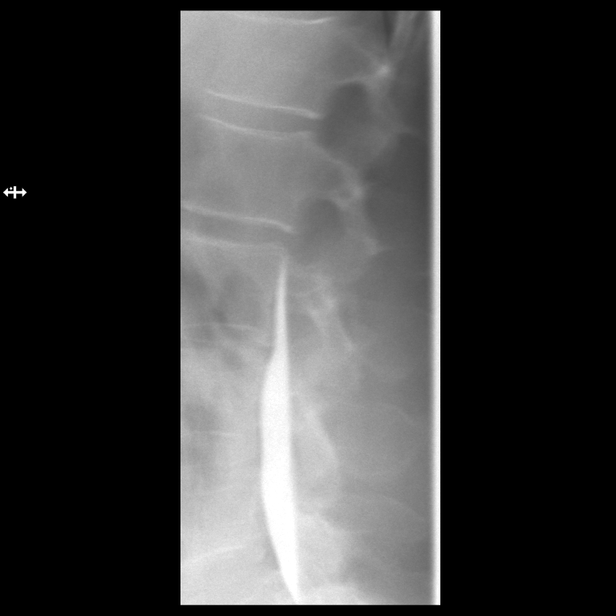

[vasc standard (7 of 11)]
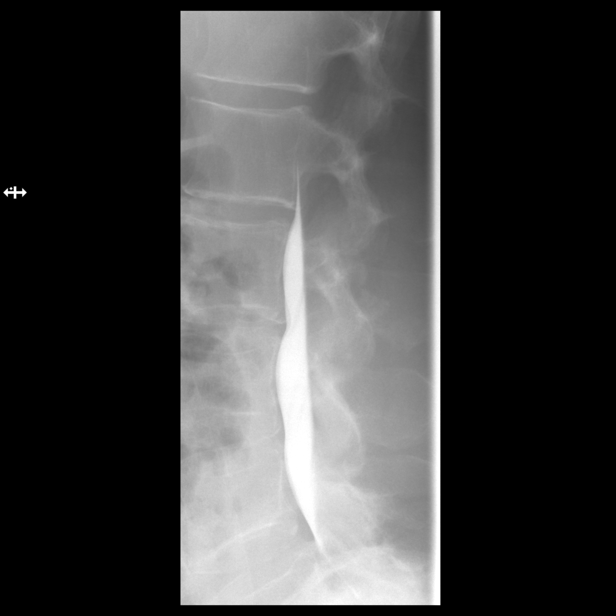

[vasc standard (8 of 11)]
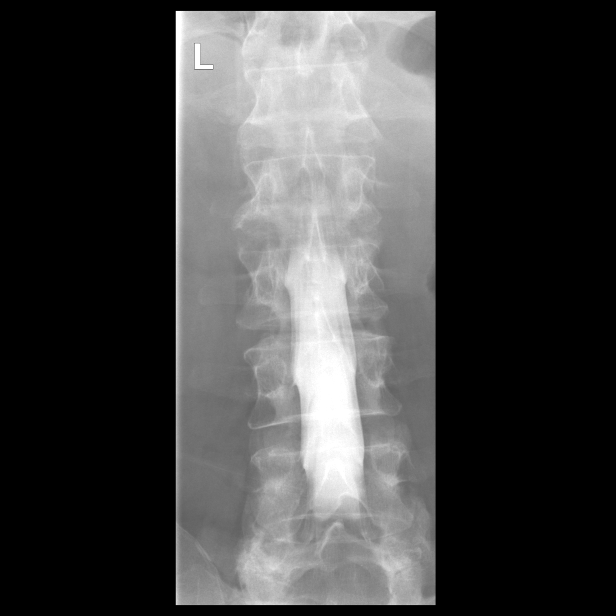

[vasc standard (9 of 11)]
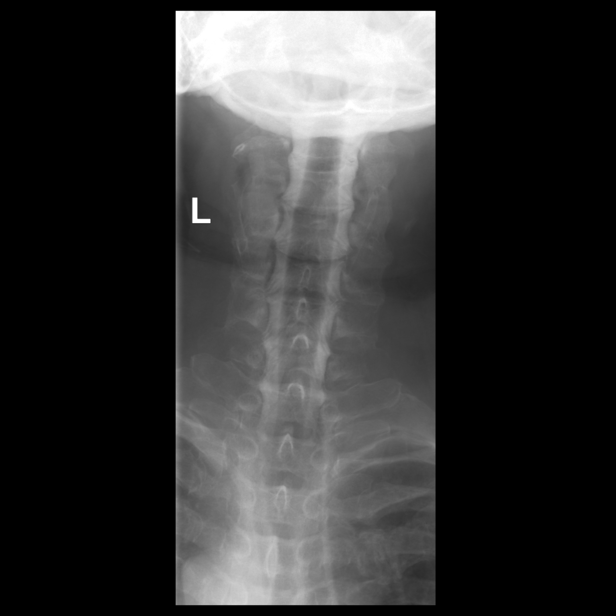

[vasc standard (10 of 11)]
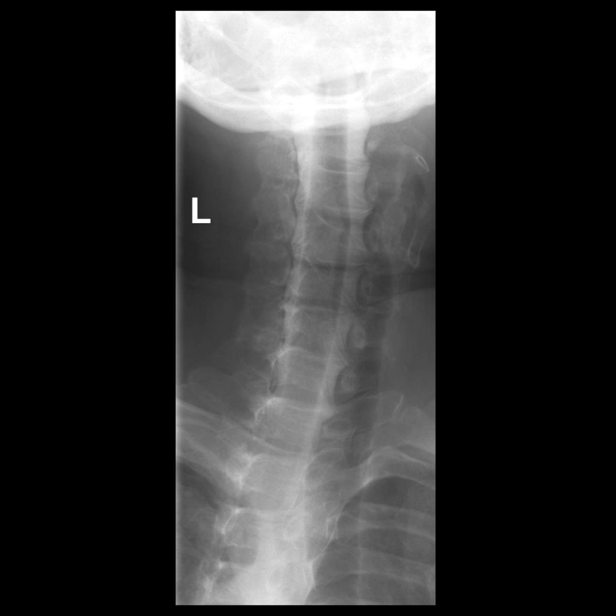

[vasc standard (11 of 11)]
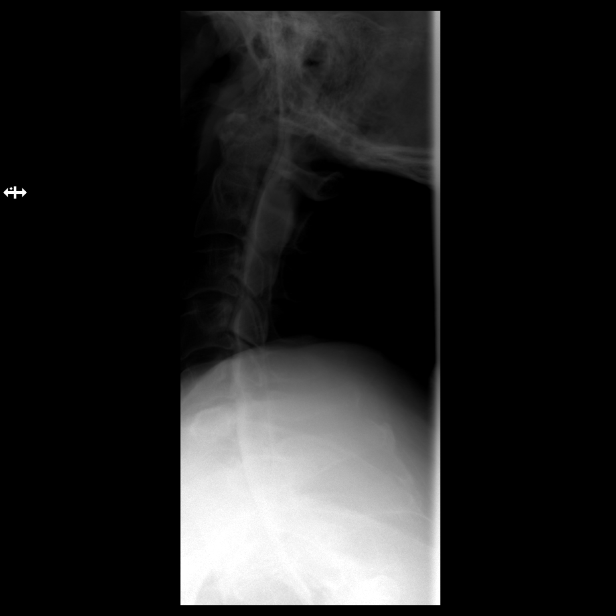

[13 of 16 positions shown; findings below may reference images not displayed]

FLUOROSCOPY TIME:  0 minutes 57 seconds. 225.34 micro gray meter
squared

PROCEDURE:
LUMBAR PUNCTURE FOR CERVICAL AND LUMBAR MYELOGRAM

CERVICAL AND LUMBAR MYELOGRAM

CT CERVICAL MYELOGRAM

CT LUMBAR MYELOGRAM

After thorough discussion of risks and benefits of the procedure
including bleeding, infection, injury to nerves, blood vessels,
adjacent structures as well as headache and CSF leak, written and
oral informed consent was obtained. Consent was obtained by Dr. Rantona
Tat Ming.

Patient was positioned prone on the fluoroscopy table. Local
anesthesia was provided with 1% lidocaine without epinephrine after
prepped and draped in the usual sterile fashion. Puncture was
performed at L3-4 using a 3 1/2 inch 22-gauge spinal needle via
right para median approach. Using a single pass through the dura,
the needle was placed within the thecal sac, with return of clear
CSF. 10 mL of [id number] was injected into the thecal sac, with
normal opacification of the nerve roots and cauda equina consistent
with free flow within the subarachnoid space. The patient was then
moved to the trendelenburg position and contrast flowed into the
Cervical spine region.

I personally performed the lumbar puncture and administered the
intrathecal contrast. I also personally performed acquisition of the
myelogram images.
FINDINGS: CERVICAL AND LUMBAR MYELOGRAM FINDINGS:

Lumbar region: No disc space narrowing. No central canal stenosis. 2
mm anterolisthesis L5-S1 with standing and flexion. Small anterior
extradural defect at L2-3 and L4-5. Conjoined left L5 and S1 root
sleeves.

Cervical region: No evidence of stenosis or nerve root compression.
Small anterior extradural defects at L3-4, L4-5 and L5-S1.

CT CERVICAL MYELOGRAM FINDINGS:

Foramen magnum is widely patent. Ordinary mild osteoarthritis at the
C1-2 articulation without stenosis.

C2-3: Annular bulging with annular calcification. Slight indentation
of the ventral subarachnoid space but no canal stenosis. Foramina
widely patent.

C3-4: Annular bulging with annular calcification. Uncovertebral
prominence more on the left. Narrowing of the ventral subarachnoid
space but no compression of the cord. AP diameter at this level 8
mm. Foraminal narrowing left worse than right. Some potential to
affect the left C4 nerve.

C4-5: Annular bulging with annular calcification. Uncovertebral
hypertrophy more on the right. Narrowing of the subarachnoid space.
AP diameter of the canal 8 mm. Foraminal narrowing right more than
left. Some potential to affect the right C5 nerve.

C5-6: Spondylosis with endplate osteophytes covering bulging disc
material. Narrowing of the subarachnoid space. AP diameter of the
canal 8 mm. Osteophytic encroachment upon the foramina bilaterally
that could affect either C6 nerve. Foraminal narrowing more marked
on the right.

C6-7:  Normal interspace.

C7-T1:  Normal interspace.

CT LUMBAR MYELOGRAM FINDINGS:

T12-L1:  Normal.  Conus tip at L1.

L1-2:  Normal

L2-3:  Minimal disc bulge.  No stenosis.

L3-4:  Normal

L4-5: Minimal bulging of the disc. Mild facet osteoarthritis. No
canal or foraminal stenosis.

L5-S1: Facet osteoarthritis left worse than right. Minimal bulging
of the disc. Left L5 and S1 root sleeves are conjoined. Foraminal
narrowing on the left could compress the exiting L5 nerve, primarily
on the basis of the facet arthropathy.
IMPRESSION: Lumbar region: Facet arthropathy at L5-S1 left worse than right.
Conjoined left L5 and S1 root sleeves. Foraminal narrowing on the
left that could affect the L5 nerve.

Cervical region: Degenerative spondylosis C2-3 through C5-6.
Narrowing of the canal without cord compression. Canal measures 8 mm
at C3-4, C4-5 and C5-6. Mild foraminal narrowing in that region,
most pronounced on the right at C5-6 and on the left at C3-4.

## 2018-11-03 DIAGNOSIS — Z23 Encounter for immunization: Secondary | ICD-10-CM | POA: Diagnosis not present

## 2018-12-10 DIAGNOSIS — G4700 Insomnia, unspecified: Secondary | ICD-10-CM | POA: Diagnosis not present

## 2018-12-17 DIAGNOSIS — M150 Primary generalized (osteo)arthritis: Secondary | ICD-10-CM | POA: Diagnosis not present

## 2018-12-17 DIAGNOSIS — R7301 Impaired fasting glucose: Secondary | ICD-10-CM | POA: Diagnosis not present

## 2018-12-17 DIAGNOSIS — J449 Chronic obstructive pulmonary disease, unspecified: Secondary | ICD-10-CM | POA: Diagnosis not present

## 2018-12-17 DIAGNOSIS — E782 Mixed hyperlipidemia: Secondary | ICD-10-CM | POA: Diagnosis not present

## 2019-02-09 DIAGNOSIS — E782 Mixed hyperlipidemia: Secondary | ICD-10-CM | POA: Diagnosis not present

## 2019-02-09 DIAGNOSIS — R7301 Impaired fasting glucose: Secondary | ICD-10-CM | POA: Diagnosis not present

## 2019-02-16 DIAGNOSIS — M5416 Radiculopathy, lumbar region: Secondary | ICD-10-CM | POA: Diagnosis not present

## 2019-02-16 DIAGNOSIS — E7849 Other hyperlipidemia: Secondary | ICD-10-CM | POA: Diagnosis not present

## 2019-02-16 DIAGNOSIS — Z720 Tobacco use: Secondary | ICD-10-CM | POA: Diagnosis not present

## 2019-02-16 DIAGNOSIS — J449 Chronic obstructive pulmonary disease, unspecified: Secondary | ICD-10-CM | POA: Diagnosis not present

## 2019-02-16 DIAGNOSIS — E782 Mixed hyperlipidemia: Secondary | ICD-10-CM | POA: Diagnosis not present

## 2019-02-16 DIAGNOSIS — R7301 Impaired fasting glucose: Secondary | ICD-10-CM | POA: Diagnosis not present

## 2019-02-16 DIAGNOSIS — G4700 Insomnia, unspecified: Secondary | ICD-10-CM | POA: Diagnosis not present

## 2019-02-16 DIAGNOSIS — M150 Primary generalized (osteo)arthritis: Secondary | ICD-10-CM | POA: Diagnosis not present

## 2019-02-16 DIAGNOSIS — M542 Cervicalgia: Secondary | ICD-10-CM | POA: Diagnosis not present

## 2019-03-16 DIAGNOSIS — M150 Primary generalized (osteo)arthritis: Secondary | ICD-10-CM | POA: Diagnosis not present

## 2019-03-16 DIAGNOSIS — E782 Mixed hyperlipidemia: Secondary | ICD-10-CM | POA: Diagnosis not present

## 2019-03-16 DIAGNOSIS — R7301 Impaired fasting glucose: Secondary | ICD-10-CM | POA: Diagnosis not present

## 2019-03-16 DIAGNOSIS — J449 Chronic obstructive pulmonary disease, unspecified: Secondary | ICD-10-CM | POA: Diagnosis not present

## 2019-04-17 DIAGNOSIS — R7301 Impaired fasting glucose: Secondary | ICD-10-CM | POA: Diagnosis not present

## 2019-04-17 DIAGNOSIS — J449 Chronic obstructive pulmonary disease, unspecified: Secondary | ICD-10-CM | POA: Diagnosis not present

## 2019-04-17 DIAGNOSIS — M150 Primary generalized (osteo)arthritis: Secondary | ICD-10-CM | POA: Diagnosis not present

## 2019-04-17 DIAGNOSIS — E782 Mixed hyperlipidemia: Secondary | ICD-10-CM | POA: Diagnosis not present

## 2019-05-12 DIAGNOSIS — Z720 Tobacco use: Secondary | ICD-10-CM | POA: Diagnosis not present

## 2019-05-12 DIAGNOSIS — J449 Chronic obstructive pulmonary disease, unspecified: Secondary | ICD-10-CM | POA: Diagnosis not present

## 2019-05-12 DIAGNOSIS — E782 Mixed hyperlipidemia: Secondary | ICD-10-CM | POA: Diagnosis not present

## 2019-05-12 DIAGNOSIS — M150 Primary generalized (osteo)arthritis: Secondary | ICD-10-CM | POA: Diagnosis not present

## 2019-05-12 DIAGNOSIS — G4700 Insomnia, unspecified: Secondary | ICD-10-CM | POA: Diagnosis not present

## 2019-06-24 DIAGNOSIS — Z720 Tobacco use: Secondary | ICD-10-CM | POA: Diagnosis not present

## 2019-06-24 DIAGNOSIS — J449 Chronic obstructive pulmonary disease, unspecified: Secondary | ICD-10-CM | POA: Diagnosis not present

## 2019-06-24 DIAGNOSIS — M150 Primary generalized (osteo)arthritis: Secondary | ICD-10-CM | POA: Diagnosis not present

## 2019-06-24 DIAGNOSIS — G4700 Insomnia, unspecified: Secondary | ICD-10-CM | POA: Diagnosis not present

## 2019-06-24 DIAGNOSIS — E782 Mixed hyperlipidemia: Secondary | ICD-10-CM | POA: Diagnosis not present

## 2019-07-02 DIAGNOSIS — M542 Cervicalgia: Secondary | ICD-10-CM | POA: Diagnosis not present

## 2019-07-02 DIAGNOSIS — M4712 Other spondylosis with myelopathy, cervical region: Secondary | ICD-10-CM | POA: Diagnosis not present

## 2019-07-08 DIAGNOSIS — M542 Cervicalgia: Secondary | ICD-10-CM | POA: Diagnosis not present

## 2019-07-08 DIAGNOSIS — M4712 Other spondylosis with myelopathy, cervical region: Secondary | ICD-10-CM | POA: Diagnosis not present

## 2019-07-29 DIAGNOSIS — G4700 Insomnia, unspecified: Secondary | ICD-10-CM | POA: Diagnosis not present

## 2019-07-29 DIAGNOSIS — M150 Primary generalized (osteo)arthritis: Secondary | ICD-10-CM | POA: Diagnosis not present

## 2019-07-29 DIAGNOSIS — J449 Chronic obstructive pulmonary disease, unspecified: Secondary | ICD-10-CM | POA: Diagnosis not present

## 2019-07-29 DIAGNOSIS — Z720 Tobacco use: Secondary | ICD-10-CM | POA: Diagnosis not present

## 2019-07-29 DIAGNOSIS — E782 Mixed hyperlipidemia: Secondary | ICD-10-CM | POA: Diagnosis not present

## 2019-08-10 DIAGNOSIS — G4700 Insomnia, unspecified: Secondary | ICD-10-CM | POA: Diagnosis not present

## 2019-08-10 DIAGNOSIS — M150 Primary generalized (osteo)arthritis: Secondary | ICD-10-CM | POA: Diagnosis not present

## 2019-08-10 DIAGNOSIS — Z720 Tobacco use: Secondary | ICD-10-CM | POA: Diagnosis not present

## 2019-08-10 DIAGNOSIS — J449 Chronic obstructive pulmonary disease, unspecified: Secondary | ICD-10-CM | POA: Diagnosis not present

## 2019-08-10 DIAGNOSIS — E782 Mixed hyperlipidemia: Secondary | ICD-10-CM | POA: Diagnosis not present

## 2019-08-21 DIAGNOSIS — F5101 Primary insomnia: Secondary | ICD-10-CM | POA: Diagnosis not present

## 2019-08-21 DIAGNOSIS — E782 Mixed hyperlipidemia: Secondary | ICD-10-CM | POA: Diagnosis not present

## 2019-08-21 DIAGNOSIS — R7301 Impaired fasting glucose: Secondary | ICD-10-CM | POA: Diagnosis not present

## 2019-08-21 DIAGNOSIS — M5010 Cervical disc disorder with radiculopathy, unspecified cervical region: Secondary | ICD-10-CM | POA: Diagnosis not present

## 2019-08-21 DIAGNOSIS — M150 Primary generalized (osteo)arthritis: Secondary | ICD-10-CM | POA: Diagnosis not present

## 2019-08-25 DIAGNOSIS — M542 Cervicalgia: Secondary | ICD-10-CM | POA: Diagnosis not present

## 2019-08-25 DIAGNOSIS — Z136 Encounter for screening for cardiovascular disorders: Secondary | ICD-10-CM | POA: Diagnosis not present

## 2019-08-25 DIAGNOSIS — Z0001 Encounter for general adult medical examination with abnormal findings: Secondary | ICD-10-CM | POA: Diagnosis not present

## 2019-08-25 DIAGNOSIS — Z720 Tobacco use: Secondary | ICD-10-CM | POA: Diagnosis not present

## 2019-09-17 DIAGNOSIS — Z720 Tobacco use: Secondary | ICD-10-CM | POA: Diagnosis not present

## 2019-09-17 DIAGNOSIS — E7849 Other hyperlipidemia: Secondary | ICD-10-CM | POA: Diagnosis not present

## 2019-09-17 DIAGNOSIS — J449 Chronic obstructive pulmonary disease, unspecified: Secondary | ICD-10-CM | POA: Diagnosis not present

## 2019-10-15 DIAGNOSIS — G4700 Insomnia, unspecified: Secondary | ICD-10-CM | POA: Diagnosis not present

## 2019-10-15 DIAGNOSIS — J449 Chronic obstructive pulmonary disease, unspecified: Secondary | ICD-10-CM | POA: Diagnosis not present

## 2019-10-15 DIAGNOSIS — Z720 Tobacco use: Secondary | ICD-10-CM | POA: Diagnosis not present

## 2019-10-15 DIAGNOSIS — E782 Mixed hyperlipidemia: Secondary | ICD-10-CM | POA: Diagnosis not present

## 2019-10-15 DIAGNOSIS — M150 Primary generalized (osteo)arthritis: Secondary | ICD-10-CM | POA: Diagnosis not present

## 2019-11-19 DIAGNOSIS — E782 Mixed hyperlipidemia: Secondary | ICD-10-CM | POA: Diagnosis not present

## 2019-11-19 DIAGNOSIS — G4700 Insomnia, unspecified: Secondary | ICD-10-CM | POA: Diagnosis not present

## 2019-11-19 DIAGNOSIS — Z720 Tobacco use: Secondary | ICD-10-CM | POA: Diagnosis not present

## 2019-11-19 DIAGNOSIS — Z23 Encounter for immunization: Secondary | ICD-10-CM | POA: Diagnosis not present

## 2019-11-19 DIAGNOSIS — M150 Primary generalized (osteo)arthritis: Secondary | ICD-10-CM | POA: Diagnosis not present

## 2019-11-19 DIAGNOSIS — J449 Chronic obstructive pulmonary disease, unspecified: Secondary | ICD-10-CM | POA: Diagnosis not present

## 2020-01-08 DIAGNOSIS — M150 Primary generalized (osteo)arthritis: Secondary | ICD-10-CM | POA: Diagnosis not present

## 2020-01-08 DIAGNOSIS — J449 Chronic obstructive pulmonary disease, unspecified: Secondary | ICD-10-CM | POA: Diagnosis not present

## 2020-01-08 DIAGNOSIS — E782 Mixed hyperlipidemia: Secondary | ICD-10-CM | POA: Diagnosis not present

## 2020-01-08 DIAGNOSIS — G4700 Insomnia, unspecified: Secondary | ICD-10-CM | POA: Diagnosis not present

## 2020-01-15 DIAGNOSIS — M150 Primary generalized (osteo)arthritis: Secondary | ICD-10-CM | POA: Diagnosis not present

## 2020-01-15 DIAGNOSIS — M5010 Cervical disc disorder with radiculopathy, unspecified cervical region: Secondary | ICD-10-CM | POA: Diagnosis not present

## 2020-01-15 DIAGNOSIS — R030 Elevated blood-pressure reading, without diagnosis of hypertension: Secondary | ICD-10-CM | POA: Diagnosis not present

## 2020-01-15 DIAGNOSIS — E782 Mixed hyperlipidemia: Secondary | ICD-10-CM | POA: Diagnosis not present

## 2020-01-15 DIAGNOSIS — R7303 Prediabetes: Secondary | ICD-10-CM | POA: Diagnosis not present

## 2020-01-15 DIAGNOSIS — F5101 Primary insomnia: Secondary | ICD-10-CM | POA: Diagnosis not present

## 2020-01-15 DIAGNOSIS — R7301 Impaired fasting glucose: Secondary | ICD-10-CM | POA: Diagnosis not present

## 2020-01-26 DIAGNOSIS — D72829 Elevated white blood cell count, unspecified: Secondary | ICD-10-CM | POA: Diagnosis not present

## 2020-01-26 DIAGNOSIS — M79602 Pain in left arm: Secondary | ICD-10-CM | POA: Diagnosis not present

## 2020-01-26 DIAGNOSIS — M5416 Radiculopathy, lumbar region: Secondary | ICD-10-CM | POA: Diagnosis not present

## 2020-01-26 DIAGNOSIS — Z720 Tobacco use: Secondary | ICD-10-CM | POA: Diagnosis not present

## 2020-01-26 DIAGNOSIS — E782 Mixed hyperlipidemia: Secondary | ICD-10-CM | POA: Diagnosis not present

## 2020-01-26 DIAGNOSIS — R7303 Prediabetes: Secondary | ICD-10-CM | POA: Diagnosis not present

## 2020-01-26 DIAGNOSIS — F17210 Nicotine dependence, cigarettes, uncomplicated: Secondary | ICD-10-CM | POA: Diagnosis not present

## 2020-01-26 DIAGNOSIS — J449 Chronic obstructive pulmonary disease, unspecified: Secondary | ICD-10-CM | POA: Diagnosis not present

## 2020-01-26 DIAGNOSIS — M542 Cervicalgia: Secondary | ICD-10-CM | POA: Diagnosis not present

## 2020-01-26 DIAGNOSIS — G4700 Insomnia, unspecified: Secondary | ICD-10-CM | POA: Diagnosis not present

## 2020-02-06 DIAGNOSIS — K219 Gastro-esophageal reflux disease without esophagitis: Secondary | ICD-10-CM | POA: Diagnosis not present

## 2020-02-06 DIAGNOSIS — I10 Essential (primary) hypertension: Secondary | ICD-10-CM | POA: Diagnosis not present

## 2020-02-06 DIAGNOSIS — M791 Myalgia, unspecified site: Secondary | ICD-10-CM | POA: Diagnosis not present

## 2020-02-06 DIAGNOSIS — E782 Mixed hyperlipidemia: Secondary | ICD-10-CM | POA: Diagnosis not present

## 2020-03-07 DIAGNOSIS — M5416 Radiculopathy, lumbar region: Secondary | ICD-10-CM | POA: Diagnosis not present

## 2020-03-07 DIAGNOSIS — Z720 Tobacco use: Secondary | ICD-10-CM | POA: Diagnosis not present

## 2020-03-07 DIAGNOSIS — E782 Mixed hyperlipidemia: Secondary | ICD-10-CM | POA: Diagnosis not present

## 2020-03-07 DIAGNOSIS — M79602 Pain in left arm: Secondary | ICD-10-CM | POA: Diagnosis not present

## 2020-03-07 DIAGNOSIS — F17210 Nicotine dependence, cigarettes, uncomplicated: Secondary | ICD-10-CM | POA: Diagnosis not present

## 2020-03-07 DIAGNOSIS — R7303 Prediabetes: Secondary | ICD-10-CM | POA: Diagnosis not present

## 2020-03-07 DIAGNOSIS — G4700 Insomnia, unspecified: Secondary | ICD-10-CM | POA: Diagnosis not present

## 2020-03-07 DIAGNOSIS — J449 Chronic obstructive pulmonary disease, unspecified: Secondary | ICD-10-CM | POA: Diagnosis not present

## 2020-03-07 DIAGNOSIS — D72829 Elevated white blood cell count, unspecified: Secondary | ICD-10-CM | POA: Diagnosis not present

## 2020-03-07 DIAGNOSIS — M542 Cervicalgia: Secondary | ICD-10-CM | POA: Diagnosis not present

## 2020-03-09 DIAGNOSIS — F5101 Primary insomnia: Secondary | ICD-10-CM | POA: Diagnosis not present

## 2020-03-09 DIAGNOSIS — M150 Primary generalized (osteo)arthritis: Secondary | ICD-10-CM | POA: Diagnosis not present

## 2020-03-09 DIAGNOSIS — E782 Mixed hyperlipidemia: Secondary | ICD-10-CM | POA: Diagnosis not present

## 2020-03-09 DIAGNOSIS — M5010 Cervical disc disorder with radiculopathy, unspecified cervical region: Secondary | ICD-10-CM | POA: Diagnosis not present

## 2020-03-11 DIAGNOSIS — R1030 Lower abdominal pain, unspecified: Secondary | ICD-10-CM | POA: Diagnosis not present

## 2020-04-06 DIAGNOSIS — E1165 Type 2 diabetes mellitus with hyperglycemia: Secondary | ICD-10-CM | POA: Diagnosis not present

## 2020-04-06 DIAGNOSIS — I10 Essential (primary) hypertension: Secondary | ICD-10-CM | POA: Diagnosis not present

## 2020-05-08 DIAGNOSIS — E1165 Type 2 diabetes mellitus with hyperglycemia: Secondary | ICD-10-CM | POA: Diagnosis not present

## 2020-05-08 DIAGNOSIS — K219 Gastro-esophageal reflux disease without esophagitis: Secondary | ICD-10-CM | POA: Diagnosis not present

## 2020-05-17 DIAGNOSIS — R109 Unspecified abdominal pain: Secondary | ICD-10-CM | POA: Diagnosis not present

## 2020-05-31 DIAGNOSIS — R109 Unspecified abdominal pain: Secondary | ICD-10-CM | POA: Diagnosis not present

## 2020-06-17 ENCOUNTER — Other Ambulatory Visit (HOSPITAL_COMMUNITY): Payer: Self-pay | Admitting: Family Medicine

## 2020-06-17 DIAGNOSIS — R109 Unspecified abdominal pain: Secondary | ICD-10-CM

## 2020-07-07 DIAGNOSIS — K219 Gastro-esophageal reflux disease without esophagitis: Secondary | ICD-10-CM | POA: Diagnosis not present

## 2020-07-07 DIAGNOSIS — E1165 Type 2 diabetes mellitus with hyperglycemia: Secondary | ICD-10-CM | POA: Diagnosis not present

## 2020-07-12 ENCOUNTER — Ambulatory Visit (HOSPITAL_COMMUNITY)
Admission: RE | Admit: 2020-07-12 | Discharge: 2020-07-12 | Disposition: A | Discharge status: Home / Self Care | Payer: Medicare Other | Source: Ambulatory Visit | Attending: Family Medicine | Admitting: Family Medicine

## 2020-07-12 ENCOUNTER — Other Ambulatory Visit: Payer: Self-pay

## 2020-07-12 DIAGNOSIS — R109 Unspecified abdominal pain: Secondary | ICD-10-CM | POA: Diagnosis not present

## 2020-07-12 DIAGNOSIS — K7689 Other specified diseases of liver: Secondary | ICD-10-CM | POA: Diagnosis not present

## 2020-07-12 DIAGNOSIS — D7389 Other diseases of spleen: Secondary | ICD-10-CM | POA: Diagnosis not present

## 2020-07-12 DIAGNOSIS — D3502 Benign neoplasm of left adrenal gland: Secondary | ICD-10-CM | POA: Diagnosis not present

## 2020-07-15 DIAGNOSIS — E119 Type 2 diabetes mellitus without complications: Secondary | ICD-10-CM | POA: Diagnosis not present

## 2020-07-15 DIAGNOSIS — E785 Hyperlipidemia, unspecified: Secondary | ICD-10-CM | POA: Diagnosis not present

## 2020-07-22 DIAGNOSIS — M542 Cervicalgia: Secondary | ICD-10-CM | POA: Diagnosis not present

## 2020-07-22 DIAGNOSIS — D3502 Benign neoplasm of left adrenal gland: Secondary | ICD-10-CM | POA: Diagnosis not present

## 2020-07-22 DIAGNOSIS — M5416 Radiculopathy, lumbar region: Secondary | ICD-10-CM | POA: Diagnosis not present

## 2020-07-22 DIAGNOSIS — Z0001 Encounter for general adult medical examination with abnormal findings: Secondary | ICD-10-CM | POA: Diagnosis not present

## 2020-07-22 DIAGNOSIS — F17210 Nicotine dependence, cigarettes, uncomplicated: Secondary | ICD-10-CM | POA: Diagnosis not present

## 2020-07-22 DIAGNOSIS — E875 Hyperkalemia: Secondary | ICD-10-CM | POA: Diagnosis not present

## 2020-07-22 DIAGNOSIS — R7303 Prediabetes: Secondary | ICD-10-CM | POA: Diagnosis not present

## 2020-07-22 DIAGNOSIS — R109 Unspecified abdominal pain: Secondary | ICD-10-CM | POA: Diagnosis not present

## 2020-07-22 DIAGNOSIS — J449 Chronic obstructive pulmonary disease, unspecified: Secondary | ICD-10-CM | POA: Diagnosis not present

## 2020-07-22 DIAGNOSIS — G4700 Insomnia, unspecified: Secondary | ICD-10-CM | POA: Diagnosis not present

## 2020-07-22 DIAGNOSIS — E782 Mixed hyperlipidemia: Secondary | ICD-10-CM | POA: Diagnosis not present

## 2020-08-07 DIAGNOSIS — E1165 Type 2 diabetes mellitus with hyperglycemia: Secondary | ICD-10-CM | POA: Diagnosis not present

## 2020-08-07 DIAGNOSIS — K219 Gastro-esophageal reflux disease without esophagitis: Secondary | ICD-10-CM | POA: Diagnosis not present

## 2020-09-07 DIAGNOSIS — E1165 Type 2 diabetes mellitus with hyperglycemia: Secondary | ICD-10-CM | POA: Diagnosis not present

## 2020-09-07 DIAGNOSIS — K219 Gastro-esophageal reflux disease without esophagitis: Secondary | ICD-10-CM | POA: Diagnosis not present

## 2020-09-15 ENCOUNTER — Ambulatory Visit: Payer: Medicare Other | Admitting: Urology

## 2020-09-27 ENCOUNTER — Encounter: Payer: Self-pay | Admitting: Urology

## 2020-09-27 ENCOUNTER — Other Ambulatory Visit: Payer: Self-pay

## 2020-09-27 ENCOUNTER — Ambulatory Visit: Payer: Medicare Other | Admitting: Urology

## 2020-09-27 VITALS — BP 160/68 | HR 101 | Wt 168.0 lb

## 2020-09-27 DIAGNOSIS — R1032 Left lower quadrant pain: Secondary | ICD-10-CM

## 2020-09-27 DIAGNOSIS — R9341 Abnormal radiologic findings on diagnostic imaging of renal pelvis, ureter, or bladder: Secondary | ICD-10-CM

## 2020-09-27 DIAGNOSIS — N400 Enlarged prostate without lower urinary tract symptoms: Secondary | ICD-10-CM | POA: Diagnosis not present

## 2020-09-27 DIAGNOSIS — R109 Unspecified abdominal pain: Secondary | ICD-10-CM

## 2020-09-27 LAB — BLADDER SCAN AMB NON-IMAGING: Scan Result: 247

## 2020-09-27 NOTE — Progress Notes (Signed)
Urological Symptom Review  Patient is experiencing the following symptoms: Frequent urination Get up at night to urinate   Review of Systems  Gastrointestinal (upper)  : Negative for upper GI symptoms  Gastrointestinal (lower) : Negative for lower GI symptoms  Constitutional : Negative for symptoms  Skin: Negative for skin symptoms  Eyes: Negative for eye symptoms  Ear/Nose/Throat : Negative for Ear/Nose/Throat symptoms  Hematologic/Lymphatic: Easy bruising  Cardiovascular : Negative for cardiovascular symptoms  Respiratory : Shortness of breath  Endocrine: Excessive thirst  Musculoskeletal: Back pain  Neurological: Negative for neurological symptoms  Psychologic: Depression Anxiety

## 2020-09-27 NOTE — Progress Notes (Signed)
post void residual=247

## 2020-09-27 NOTE — Addendum Note (Signed)
Addended byIris Pert on: 09/27/2020 02:41 PM   Modules accepted: Orders

## 2020-09-27 NOTE — Progress Notes (Signed)
Assessment: 1. Left lower quadrant abdominal pain   2. Abnormal computed tomography of bladder   3. BPH without obstruction/lower urinary tract symptoms      Plan: Request PSA results from Dr. Nevada Crane I personally reviewed the CT study.  He does have evidence of some prostate enlargement.  He may have some bladder wall thickening secondary to BPH but he is not currently symptomatic.  I think it would be reasonable to monitor his symptoms at the present time. Return to office in 1 year or as needed worsening of symptoms.  Chief Complaint:  Chief Complaint  Patient presents with   Abdominal Pain    History of Present Illness:  Carlos [name] is a 65 y.o. year old male who is seen in consultation from Celene Squibb, MD for evaluation of abnormal CT findings of the bladder.  He has a recent history of left-sided lower abdominal pain.  This was improved after eating.  No association with bowel movements or with voiding.  He was evaluated with CT imaging which showed a left-sided adrenal adenoma measuring 15 x 11 mm prostate enlargement with a volume of 54 cm, and bladder wall thickening.  He denies any significant lower urinary tract symptoms.  He reports nocturia 0-1 time per night.  No dysuria or gross hematuria.  No history of UTIs. AUA score = 3 today.   Past Medical History:  Past Medical History:  Diagnosis Date   Anxiety    Cancer ([place])    Skin cancer left shoulder   Chronic low back pain    Dermatofibrosarcoma protubera of right shoulder    Hyperlipidemia     Past Surgical History:  Past Surgical History:  Procedure Laterality Date   APPENDECTOMY     APPLICATION OF A-CELL OF EXTREMITY Right 07/09/2012   Procedure: PLACEMENT OF A-CELL TO UPPER EXTREMITY/PLACEMENT OF VAC;  Surgeon: Theodoro Kos, DO;  Location: [facility];  Service: Plastics;  Laterality: Right;   COLONOSCOPY WITH PROPOFOL N/A 09/30/2015   Procedure: COLONOSCOPY WITH PROPOFOL;  Surgeon: Rogene Houston, MD;  [facility];  Service: Endoscopy;  Laterality: N/A;  [place] RIGHT SHOULDER  2 - 3 WKS AGO   INGUINAL HERNIA REPAIR Left 1999   KNEE ARTHROSCOPY Left 2012   POLYPECTOMY  09/30/2015   Procedure: POLYPECTOMY;  Surgeon: Rogene Houston, MD;  [facility];  Service: Endoscopy;;  sigmoid    Allergies:  Allergies  Allergen Reactions   Ciprofloxacin Swelling and Other (See Comments)    Swelling in hands, numbness in arms and joint pain   Gabapentin Other (See Comments)    Double vision    Family History:  Family History  Problem Relation Age of Onset   Dementia Mother    Cancer Father     Social History:  Social History   Tobacco Use   Smoking status: Every Day    Packs/day: 1.00    Years: 40.00    Pack years: 40.00    Types: Cigarettes   Smokeless tobacco: Never  Substance Use Topics   Alcohol use: No   Drug use: No    Review of symptoms:  Constitutional:  Negative for unexplained weight loss, night sweats, fever, chills ENT:  Negative for nose bleeds, sinus pain, painful swallowing CV:  Negative for chest pain, shortness of breath, exercise intolerance, palpitations, loss of consciousness Resp:  Negative for cough, wheezing, shortness of breath GI:  Negative for nausea,  vomiting, diarrhea, bloody stools GU:  Positives noted in HPI; otherwise negative for gross hematuria, dysuria, urinary incontinence Neuro:  Negative for seizures, poor balance, limb weakness, slurred speech Psych:  Negative for lack of energy, depression, anxiety Endocrine:  Negative for polydipsia, polyuria, symptoms of hypoglycemia (dizziness, hunger, sweating) Hematologic:  Negative for anemia, purpura, petechia, prolonged or excessive bleeding, use of anticoagulants  Allergic:  Negative for difficulty breathing or choking as a result of exposure to anything; no shellfish allergy; no allergic response (rash/itch) to materials,  foods  Physical exam BP (!) 160/68   Pulse (!) 101   Wt 168 lb (76.2 kg)   BMI 27.12 kg/m  GENERAL APPEARANCE:  Well appearing, well developed, well nourished, NAD HEENT: Atraumatic, Normocephalic, oropharynx clear. NECK: Supple without lymphadenopathy or thyromegaly. LUNGS: Clear to auscultation bilaterally. HEART: Regular Rate and Rhythm without murmurs, gallops, or rubs. ABDOMEN: Soft, non-tender, No Masses. EXTREMITIES: Moves all extremities well.  Without clubbing, cyanosis, or edema. NEUROLOGIC:  Alert and oriented x 3, normal gait, CN II-XII grossly intact.  MENTAL STATUS:  Appropriate. BACK:  Non-tender to palpation.  No CVAT SKIN:  Warm, dry and intact.   GU: Penis:  uncircumcised Meatus: Normal Scrotum: normal, no masses Testis: normal without masses  Epididymis: normal Prostate: 40 g, NT, no nodules Rectum: Normal tone, normal prostate, no masses or tenderness   Results: No specimen provided  Bladder scan:  247 ml (not true PVR since patient was unable to void prior to study)

## 2020-10-23 ENCOUNTER — Emergency Department (HOSPITAL_COMMUNITY): Payer: Medicare Other

## 2020-10-23 ENCOUNTER — Encounter (HOSPITAL_COMMUNITY): Payer: Self-pay | Admitting: *Deleted

## 2020-10-23 ENCOUNTER — Other Ambulatory Visit: Payer: Self-pay

## 2020-10-23 ENCOUNTER — Emergency Department (HOSPITAL_COMMUNITY)
Admission: EM | Admit: 2020-10-23 | Discharge: 2020-10-23 | Disposition: A | Discharge status: Home / Self Care | Payer: Medicare Other | Attending: Emergency Medicine | Admitting: Emergency Medicine

## 2020-10-23 DIAGNOSIS — S6992XA Unspecified injury of left wrist, hand and finger(s), initial encounter: Secondary | ICD-10-CM | POA: Diagnosis present

## 2020-10-23 DIAGNOSIS — T148XXA Other injury of unspecified body region, initial encounter: Secondary | ICD-10-CM

## 2020-10-23 DIAGNOSIS — S62633A Displaced fracture of distal phalanx of left middle finger, initial encounter for closed fracture: Secondary | ICD-10-CM | POA: Insufficient documentation

## 2020-10-23 DIAGNOSIS — F17210 Nicotine dependence, cigarettes, uncomplicated: Secondary | ICD-10-CM | POA: Insufficient documentation

## 2020-10-23 DIAGNOSIS — S61313A Laceration without foreign body of left middle finger with damage to nail, initial encounter: Secondary | ICD-10-CM

## 2020-10-23 DIAGNOSIS — Z79899 Other long term (current) drug therapy: Secondary | ICD-10-CM | POA: Diagnosis not present

## 2020-10-23 DIAGNOSIS — Z85828 Personal history of other malignant neoplasm of skin: Secondary | ICD-10-CM | POA: Diagnosis not present

## 2020-10-23 DIAGNOSIS — S61213A Laceration without foreign body of left middle finger without damage to nail, initial encounter: Secondary | ICD-10-CM | POA: Insufficient documentation

## 2020-10-23 DIAGNOSIS — W268XXA Contact with other sharp object(s), not elsewhere classified, initial encounter: Secondary | ICD-10-CM | POA: Insufficient documentation

## 2020-10-23 DIAGNOSIS — S62633B Displaced fracture of distal phalanx of left middle finger, initial encounter for open fracture: Secondary | ICD-10-CM | POA: Diagnosis not present

## 2020-10-23 MED ORDER — HYDROCODONE-ACETAMINOPHEN 5-325 MG PO TABS: 1.0000 | ORAL_TABLET | ORAL | 0 refills | Status: AC | PRN

## 2020-10-23 MED ORDER — SULFAMETHOXAZOLE-TRIMETHOPRIM 800-160 MG PO TABS
1.0000 | ORAL_TABLET | Freq: Once | ORAL | Status: AC
  Administered 2020-10-23: 1 via ORAL
  Filled 2020-10-23: qty 1

## 2020-10-23 MED ORDER — SULFAMETHOXAZOLE-TRIMETHOPRIM 800-160 MG PO TABS: 1.0000 | ORAL_TABLET | Freq: Two times a day (BID) | ORAL | 0 refills | Status: DC

## 2020-10-23 MED ORDER — BUPIVACAINE HCL (PF) 0.5 % IJ SOLN
10.0000 mL | Freq: Once | INTRAMUSCULAR | Status: DC
  Filled 2020-10-23: qty 30

## 2020-10-23 NOTE — ED Provider Notes (Signed)
[facility] EMERGENCY DEPARTMENT Provider Note   CSN: [id number] Arrival date & time: 10/23/20  1027     History Chief Complaint  Patient presents with   Laceration    Carlos [name] is a 65 y.o. male.  c  The history is provided by the patient.  Laceration Location:  Hand and finger Finger laceration location:  L middle finger Length:  2 cm Depth:  Through dermis Quality: straight   Bleeding: controlled   Time since incident:  1 hour Injury mechanism: lawn mower blade. Pain details:    Quality:  Aching Foreign body present:  No foreign bodies Relieved by:  Nothing Worsened by:  Nothing Tetanus status:  Up to date Pt report he cut finger with a lawn mower blade.      Past Medical History:  Diagnosis Date   Anxiety    Cancer ([place])    Skin cancer left shoulder   Chronic low back pain    Dermatofibrosarcoma protubera of right shoulder    Hyperlipidemia     Patient Active Problem List   Diagnosis Date Noted   Special screening for malignant neoplasms, colon 08/22/2015   Skin cancer of trunk 07/09/2012   Lumbago 01/22/2012    Past Surgical History:  Procedure Laterality Date   APPENDECTOMY     APPLICATION OF A-CELL OF EXTREMITY Right 07/09/2012   Procedure: PLACEMENT OF A-CELL TO UPPER EXTREMITY/PLACEMENT OF VAC;  Surgeon: Theodoro Kos, DO;  Location: [place];  Service: Plastics;  Laterality: Right;   COLONOSCOPY WITH PROPOFOL N/A 09/30/2015   Procedure: COLONOSCOPY WITH PROPOFOL;  Surgeon: Rogene Houston, MD;  [facility];  Service: Endoscopy;  Laterality: N/A;  [place] RIGHT SHOULDER  2 - 3 WKS AGO   INGUINAL HERNIA REPAIR Left 1999   KNEE ARTHROSCOPY Left 2012   POLYPECTOMY  09/30/2015   Procedure: POLYPECTOMY;  Surgeon: Rogene Houston, MD;  [facility];  Service: Endoscopy;;  sigmoid       Family History  Problem Relation Age of Onset   Dementia Mother    Cancer  Father     Social History   Tobacco Use   Smoking status: Every Day    Packs/day: 1.00    Years: 40.00    Pack years: 40.00    Types: Cigarettes   Smokeless tobacco: Never  Substance Use Topics   Alcohol use: No   Drug use: No    Home Medications Prior to Admission medications   Medication Sig Start Date End Date Taking? Authorizing Provider  busPIRone (BUSPAR) 5 MG tablet Take 1 tablet by mouth 2 (two) times daily. 06/05/16   [provider]  clonazePAM (KLONOPIN) 1 MG disintegrating tablet Take by mouth.    [provider]  HYDROcodone-acetaminophen (NORCO/VICODIN) 5-325 MG tablet TK 1 T PO BID 06/05/16   [provider]  meloxicam (MOBIC) 15 MG tablet Take 15 mg by mouth daily. 09/12/20   [provider]  pantoprazole (PROTONIX) 40 MG tablet Take 40 mg by mouth daily. 09/12/20   [provider]  sertraline (ZOLOFT) 50 MG tablet Take 50 mg by mouth daily.    [provider]  simvastatin (ZOCOR) 10 MG tablet Take by mouth.    [provider]  simvastatin (ZOCOR) 40 MG tablet TK 1 T PO QD 04/25/16   [provider]    Allergies    Ciprofloxacin and Gabapentin  Review of Systems  Review of Systems  Musculoskeletal:  Positive for joint swelling.  Skin:  Positive for color change and wound.  All other systems reviewed and are negative.  Physical Exam Updated Vital Signs BP (!) 169/79 (BP Location: Right Arm)   Pulse 86   Temp 98.7 F (37.1 C) (Oral)   Resp 18   Wt 81.2 kg   SpO2 94%   BMI 28.91 kg/m   Physical Exam Vitals reviewed.  Constitutional:      Appearance: Normal appearance.  Musculoskeletal:        General: Signs of injury present.     Comments: Decreased extension,  laceration near bottom of nail bed,  nv and ns intact, nail avulsed    Skin:    General: Skin is warm.  Neurological:     General: No focal deficit present.     Mental Status: He is alert.  Psychiatric:        Mood and  Affect: Mood normal.    ED Results / Procedures / Treatments   Labs (all labs ordered are listed, but only abnormal results are displayed) Labs Reviewed - No data to display  EKG None  Radiology DG Finger Middle Left  Result Date: 10/23/2020 CLINICAL DATA:  Laceration to LEFT middle finger. EXAM: LEFT MIDDLE FINGER 2+V COMPARISON:  None. FINDINGS: Slightly displaced fracture of the distal phalanx of the LEFT middle finger. Overlying soft tissue laceration. No malalignment at the D IP joint. Final images show what appears to be a round dense foreign body within the superficial soft tissues underlying the distal phalanx, however, this is not seen on the other views and is presumed to be artifactual. IMPRESSION: 1. Slightly displaced fracture of the distal phalanx of the LEFT middle finger. Overlying soft tissue laceration. 2. Round dense foreign body within the superficial soft tissues underlying the distal phalanx, seen on one view only, presumed artifactual as it is not seen on the other views. Electronically Signed   By: Franki Cabot M.D.   On: 10/23/2020 11:50       Procedures .Marland KitchenLaceration Repair  Date/Time: 10/23/2020 4:23 PM Performed by: Fransico Meadow, PA-C Authorized by: Fransico Meadow, PA-C   Consent:    Consent obtained:  Verbal   Consent given by:  Patient   Risks, benefits, and alternatives were discussed: yes     Risks discussed:  Infection   Alternatives discussed:  No treatment Universal protocol:    Procedure explained and questions answered to patient or proxy's satisfaction: yes     Patient identity confirmed:  Verbally with patient Anesthesia:    Anesthesia method:  Nerve block   Block needle gauge:  27 G   Block anesthetic:  Bupivacaine 0.5% w/o epi   Block injection procedure:  Anatomic landmarks identified Laceration details:    Location:  Finger   Finger location:  L long finger   Length (cm):  1   Depth (mm):  5 Pre-procedure details:     Preparation:  Patient was prepped and draped in usual sterile fashion Exploration:    Wound exploration: wound explored through full range of motion     Wound extent: no foreign bodies/material noted     Contaminated: no   Treatment:    Area cleansed with:  Povidone-iodine   Amount of cleaning:  Standard   Irrigation solution:  Sterile saline   Debridement:  None   Undermining:  None Skin repair:    Repair method:  Sutures   Suture size:  5-0   Suture material:  Prolene   Number of sutures:  4 Approximation:    Approximation:  Close Repair type:    Repair type:  Intermediate Post-procedure details:    Dressing:  Non-adherent dressing   Procedure completion:  Tolerated   Medications Ordered in ED Medications - No data to display  ED Course  I have reviewed the triage vital signs and the nursing notes.  Pertinent labs & imaging results that were available during my care of the patient were reviewed by me and considered in my medical decision making (see chart for details).    MDM Rules/Calculators/A&P                           MDM:  Pt counseled on need for referral to Hand surgery.  Pt states she is not going to do that.  He just wants area closed up.  Pt advised he has a open fracture.   Pt's sister here.  I discussed with her the fact that pt will need to see hand.   I closed wound and tacked nail in and sutured down.  Pt advised of need for suture removal.  Pt given number for Hand.  He is advised to call if he changes his mind.  Pt advised of risk of infection to bone, loss of function. Nerve damage.   Final Clinical Impression(s) / ED Diagnoses Final diagnoses:  Laceration of left middle finger without foreign body with damage to nail, initial encounter  Open fracture    Rx / DC Orders ED Discharge Orders     None     An After Visit Summary was printed and given to the patient.    Fransico Meadow, Hershal Coria 10/23/20 1626    Fredia Sorrow, MD 10/28/20  [phone]

## 2020-10-23 NOTE — ED Notes (Signed)
PA at bedside.

## 2020-10-23 NOTE — ED Triage Notes (Signed)
Laceration to left middle finger, cut on mower blade

## 2020-10-23 NOTE — Discharge Instructions (Addendum)
Take antibiotic as directed, pain medication as needed.  You will need suture removal in 8 days.  Schedule follow up with Hand surgeon.

## 2020-11-02 ENCOUNTER — Ambulatory Visit
Admission: EM | Admit: 2020-11-02 | Discharge: 2020-11-02 | Disposition: A | Discharge status: Home / Self Care | Payer: Medicare Other

## 2020-11-02 ENCOUNTER — Encounter: Payer: Self-pay | Admitting: Emergency Medicine

## 2020-11-02 ENCOUNTER — Other Ambulatory Visit: Payer: Self-pay

## 2020-11-02 NOTE — ED Notes (Signed)
5 sutures removed from left ring finger

## 2020-11-02 NOTE — ED Triage Notes (Signed)
Here for suture removal from left middle finger

## 2020-11-07 DIAGNOSIS — K219 Gastro-esophageal reflux disease without esophagitis: Secondary | ICD-10-CM | POA: Diagnosis not present

## 2020-11-07 DIAGNOSIS — E1165 Type 2 diabetes mellitus with hyperglycemia: Secondary | ICD-10-CM | POA: Diagnosis not present

## 2020-11-24 DIAGNOSIS — D3502 Benign neoplasm of left adrenal gland: Secondary | ICD-10-CM | POA: Diagnosis not present

## 2020-11-24 DIAGNOSIS — E782 Mixed hyperlipidemia: Secondary | ICD-10-CM | POA: Diagnosis not present

## 2020-11-24 DIAGNOSIS — R7303 Prediabetes: Secondary | ICD-10-CM | POA: Diagnosis not present

## 2020-12-06 DIAGNOSIS — M542 Cervicalgia: Secondary | ICD-10-CM | POA: Diagnosis not present

## 2020-12-06 DIAGNOSIS — Z23 Encounter for immunization: Secondary | ICD-10-CM | POA: Diagnosis not present

## 2020-12-06 DIAGNOSIS — R7303 Prediabetes: Secondary | ICD-10-CM | POA: Diagnosis not present

## 2020-12-06 DIAGNOSIS — F17210 Nicotine dependence, cigarettes, uncomplicated: Secondary | ICD-10-CM | POA: Diagnosis not present

## 2020-12-06 DIAGNOSIS — S62601D Fracture of unspecified phalanx of left index finger, subsequent encounter for fracture with routine healing: Secondary | ICD-10-CM | POA: Diagnosis not present

## 2020-12-06 DIAGNOSIS — D3502 Benign neoplasm of left adrenal gland: Secondary | ICD-10-CM | POA: Diagnosis not present

## 2020-12-06 DIAGNOSIS — J449 Chronic obstructive pulmonary disease, unspecified: Secondary | ICD-10-CM | POA: Diagnosis not present

## 2020-12-06 DIAGNOSIS — G4700 Insomnia, unspecified: Secondary | ICD-10-CM | POA: Diagnosis not present

## 2020-12-06 DIAGNOSIS — E782 Mixed hyperlipidemia: Secondary | ICD-10-CM | POA: Diagnosis not present

## 2020-12-06 DIAGNOSIS — E875 Hyperkalemia: Secondary | ICD-10-CM | POA: Diagnosis not present

## 2020-12-06 DIAGNOSIS — R109 Unspecified abdominal pain: Secondary | ICD-10-CM | POA: Diagnosis not present

## 2020-12-06 DIAGNOSIS — M5416 Radiculopathy, lumbar region: Secondary | ICD-10-CM | POA: Diagnosis not present

## 2020-12-07 DIAGNOSIS — E1165 Type 2 diabetes mellitus with hyperglycemia: Secondary | ICD-10-CM | POA: Diagnosis not present

## 2020-12-07 DIAGNOSIS — K219 Gastro-esophageal reflux disease without esophagitis: Secondary | ICD-10-CM | POA: Diagnosis not present

## 2020-12-14 ENCOUNTER — Ambulatory Visit: Payer: Medicare Other | Admitting: Orthopedic Surgery

## 2020-12-14 ENCOUNTER — Other Ambulatory Visit: Payer: Self-pay

## 2020-12-14 ENCOUNTER — Ambulatory Visit: Payer: Medicare Other

## 2020-12-14 DIAGNOSIS — M79645 Pain in left finger(s): Secondary | ICD-10-CM

## 2020-12-14 DIAGNOSIS — S62633B Displaced fracture of distal phalanx of left middle finger, initial encounter for open fracture: Secondary | ICD-10-CM | POA: Diagnosis not present

## 2020-12-14 NOTE — Progress Notes (Signed)
inger

## 2020-12-15 ENCOUNTER — Encounter: Payer: Self-pay | Admitting: Orthopedic Surgery

## 2020-12-15 NOTE — Progress Notes (Signed)
New Patient Visit  Assessment: Carlos [name] is a 65 y.o. male with the following: 1. Displaced fracture of distal phalanx of left middle finger, initial encounter for open fracture  Plan: Injury sustained almost 2 months ago.  Pain is better.  Fracture still healing based on radiographs.  Mild tenderness on physical exam.  Some residual swelling and redness is appreciated.  He can almost make a full fist.  He can fully extend the finger.  I am not concerned about terminal extensor tendon rupture.  Based on the appearance, he is concerned that he might be developing infection.  I reassured him based on my exam.  I provided him with some return precautions, including symptoms to be mindful of.  He stated his understanding.  He will contact the clinic if he has any further issues.  He is comfortable without scheduling a follow-up appointment.   Follow-up: Return if symptoms worsen or fail to improve.  Subjective:  Chief Complaint  Patient presents with   Hand Injury    LT middle finger DOI 10/23/20    History of Present Illness: Carlos [name] is a 65 y.o. male who presents for evaluation of left long finger pain.  Approximately 2 months ago, a lawnmower blade inadvertently cut his left long finger.  He presented to the emergency department.  He stated it was a near complete amputation of the distal aspect of the finger.  In the emergency department, x-rays demonstrated an injury to the distal phalanx.  The laceration was irrigated and sutured.  He was given some antibiotics.  He was also placed in a small splint.  He returned to have the sutures removed, but has not had any additional follow-up.  His biggest concern at this time, as the residual redness, and lingering pain.  He denies fevers or chills.  He can fully extend his finger.  He has a near complete fist.  He does have some pain at the distal aspect of the finger.   Review of Systems: No fevers or chills No numbness or  tingling No chest pain No shortness of breath No bowel or bladder dysfunction No GI distress No headaches   Medical History:  Past Medical History:  Diagnosis Date   Anxiety    Cancer ([place])    Skin cancer left shoulder   Chronic low back pain    Dermatofibrosarcoma protubera of right shoulder    Hyperlipidemia     Past Surgical History:  Procedure Laterality Date   APPENDECTOMY     APPLICATION OF A-CELL OF EXTREMITY Right 07/09/2012   Procedure: PLACEMENT OF A-CELL TO UPPER EXTREMITY/PLACEMENT OF VAC;  Surgeon: Theodoro Kos, DO;  Location: [facility];  Service: Plastics;  Laterality: Right;   COLONOSCOPY WITH PROPOFOL N/A 09/30/2015   Procedure: COLONOSCOPY WITH PROPOFOL;  Surgeon: Rogene Houston, MD;  [facility];  Service: Endoscopy;  Laterality: N/A;  [place] RIGHT SHOULDER  2 - 3 WKS AGO   INGUINAL HERNIA REPAIR Left 1999   KNEE ARTHROSCOPY Left 2012   POLYPECTOMY  09/30/2015   Procedure: POLYPECTOMY;  Surgeon: Rogene Houston, MD;  [facility];  Service: Endoscopy;;  sigmoid    Family History  Problem Relation Age of Onset   Dementia Mother    Cancer Father    Social History   Tobacco Use   Smoking status: Every Day    Packs/day: 1.00    Years: 40.00  Pack years: 40.00    Types: Cigarettes   Smokeless tobacco: Never  Substance Use Topics   Alcohol use: No   Drug use: No    Allergies  Allergen Reactions   Ciprofloxacin Swelling and Other (See Comments)    Swelling in hands, numbness in arms and joint pain   Gabapentin Other (See Comments)    Double vision    Current Meds  Medication Sig   busPIRone (BUSPAR) 5 MG tablet Take 1 tablet by mouth 2 (two) times daily.   clonazePAM (KLONOPIN) 1 MG disintegrating tablet Take by mouth.   HYDROcodone-acetaminophen (NORCO/VICODIN) 5-325 MG tablet Take 1 tablet by mouth every 4 (four) hours as needed for moderate pain.   meloxicam  (MOBIC) 15 MG tablet Take 15 mg by mouth daily.   pantoprazole (PROTONIX) 40 MG tablet Take 40 mg by mouth daily.   sertraline (ZOLOFT) 50 MG tablet Take 50 mg by mouth daily.   simvastatin (ZOCOR) 10 MG tablet Take by mouth.   simvastatin (ZOCOR) 40 MG tablet TK 1 T PO QD   sulfamethoxazole-trimethoprim (BACTRIM DS) 800-160 MG tablet Take 1 tablet by mouth 2 (two) times daily.    Objective: There were no vitals taken for this visit.  Physical Exam:  General: Alert and oriented. and No acute distress. Gait: Normal gait.  Evaluation of left long finger demonstrates a healed laceration.  There is some redness to the distal extent of the finger.  He can achieve full extension actively.  When he makes a fist, he cannot fully close his long finger.  Sensation is intact to the radial and ulnar aspect of the distal phalanx.  Brisk capillary refill to the tip of the finger.  No gross motion of the distal phalanx.  IMAGING: I personally ordered and reviewed the following images  X-ray of the left long finger were obtained in clinic today, compared to previous x-rays.  There is been minimal interval displacement of the fracture through the distal phalanx of the long finger.  No obvious interval callus formation.  Overall alignment remains good.  No acute injuries are noted.  No evidence of osteomyelitis.  Impression: Healing transverse fracture of the left long finger distal phalanx.   New Medications:  No orders of the defined types were placed in this encounter.     Mordecai Rasmussen, MD  12/15/2020 12:29 PM

## 2020-12-16 DIAGNOSIS — H2513 Age-related nuclear cataract, bilateral: Secondary | ICD-10-CM | POA: Diagnosis not present

## 2020-12-20 DIAGNOSIS — H2511 Age-related nuclear cataract, right eye: Secondary | ICD-10-CM | POA: Diagnosis not present

## 2020-12-20 DIAGNOSIS — Z01818 Encounter for other preprocedural examination: Secondary | ICD-10-CM | POA: Diagnosis not present

## 2020-12-20 DIAGNOSIS — H2512 Age-related nuclear cataract, left eye: Secondary | ICD-10-CM | POA: Diagnosis not present

## 2020-12-20 DIAGNOSIS — H43811 Vitreous degeneration, right eye: Secondary | ICD-10-CM | POA: Diagnosis not present

## 2021-01-12 DIAGNOSIS — H25811 Combined forms of age-related cataract, right eye: Secondary | ICD-10-CM | POA: Diagnosis not present

## 2021-01-12 DIAGNOSIS — H2511 Age-related nuclear cataract, right eye: Secondary | ICD-10-CM | POA: Diagnosis not present

## 2021-01-20 DIAGNOSIS — H2512 Age-related nuclear cataract, left eye: Secondary | ICD-10-CM | POA: Diagnosis not present

## 2021-01-21 DIAGNOSIS — H2512 Age-related nuclear cataract, left eye: Secondary | ICD-10-CM | POA: Diagnosis not present

## 2021-02-07 DIAGNOSIS — I10 Essential (primary) hypertension: Secondary | ICD-10-CM | POA: Diagnosis not present

## 2021-02-07 DIAGNOSIS — E782 Mixed hyperlipidemia: Secondary | ICD-10-CM | POA: Diagnosis not present

## 2021-03-07 DIAGNOSIS — K219 Gastro-esophageal reflux disease without esophagitis: Secondary | ICD-10-CM | POA: Diagnosis not present

## 2021-03-07 DIAGNOSIS — E1165 Type 2 diabetes mellitus with hyperglycemia: Secondary | ICD-10-CM | POA: Diagnosis not present

## 2021-04-07 DIAGNOSIS — E1165 Type 2 diabetes mellitus with hyperglycemia: Secondary | ICD-10-CM | POA: Diagnosis not present

## 2021-04-07 DIAGNOSIS — K219 Gastro-esophageal reflux disease without esophagitis: Secondary | ICD-10-CM | POA: Diagnosis not present

## 2021-05-07 DIAGNOSIS — E782 Mixed hyperlipidemia: Secondary | ICD-10-CM | POA: Diagnosis not present

## 2021-05-07 DIAGNOSIS — I10 Essential (primary) hypertension: Secondary | ICD-10-CM | POA: Diagnosis not present

## 2021-06-02 DIAGNOSIS — R7303 Prediabetes: Secondary | ICD-10-CM | POA: Diagnosis not present

## 2021-06-02 DIAGNOSIS — E782 Mixed hyperlipidemia: Secondary | ICD-10-CM | POA: Diagnosis not present

## 2021-06-06 DIAGNOSIS — E782 Mixed hyperlipidemia: Secondary | ICD-10-CM | POA: Diagnosis not present

## 2021-06-06 DIAGNOSIS — I10 Essential (primary) hypertension: Secondary | ICD-10-CM | POA: Diagnosis not present

## 2021-06-07 DIAGNOSIS — G4700 Insomnia, unspecified: Secondary | ICD-10-CM | POA: Diagnosis not present

## 2021-06-07 DIAGNOSIS — M5416 Radiculopathy, lumbar region: Secondary | ICD-10-CM | POA: Diagnosis not present

## 2021-06-07 DIAGNOSIS — R7303 Prediabetes: Secondary | ICD-10-CM | POA: Diagnosis not present

## 2021-06-07 DIAGNOSIS — D72829 Elevated white blood cell count, unspecified: Secondary | ICD-10-CM | POA: Diagnosis not present

## 2021-06-07 DIAGNOSIS — J449 Chronic obstructive pulmonary disease, unspecified: Secondary | ICD-10-CM | POA: Diagnosis not present

## 2021-06-07 DIAGNOSIS — F17210 Nicotine dependence, cigarettes, uncomplicated: Secondary | ICD-10-CM | POA: Diagnosis not present

## 2021-06-07 DIAGNOSIS — D3502 Benign neoplasm of left adrenal gland: Secondary | ICD-10-CM | POA: Diagnosis not present

## 2021-06-07 DIAGNOSIS — M542 Cervicalgia: Secondary | ICD-10-CM | POA: Diagnosis not present

## 2021-06-07 DIAGNOSIS — E782 Mixed hyperlipidemia: Secondary | ICD-10-CM | POA: Diagnosis not present

## 2021-06-07 DIAGNOSIS — E875 Hyperkalemia: Secondary | ICD-10-CM | POA: Diagnosis not present

## 2021-06-07 DIAGNOSIS — R109 Unspecified abdominal pain: Secondary | ICD-10-CM | POA: Diagnosis not present

## 2021-06-09 ENCOUNTER — Other Ambulatory Visit: Payer: Self-pay | Admitting: Internal Medicine

## 2021-06-09 ENCOUNTER — Other Ambulatory Visit (HOSPITAL_COMMUNITY): Payer: Self-pay | Admitting: Internal Medicine

## 2021-06-09 DIAGNOSIS — D3502 Benign neoplasm of left adrenal gland: Secondary | ICD-10-CM

## 2021-07-07 DIAGNOSIS — I10 Essential (primary) hypertension: Secondary | ICD-10-CM | POA: Diagnosis not present

## 2021-07-07 DIAGNOSIS — E782 Mixed hyperlipidemia: Secondary | ICD-10-CM | POA: Diagnosis not present

## 2021-07-19 ENCOUNTER — Ambulatory Visit (HOSPITAL_COMMUNITY)
Admission: RE | Admit: 2021-07-19 | Discharge: 2021-07-19 | Disposition: A | Discharge status: Home / Self Care | Payer: Medicare Other | Source: Ambulatory Visit | Attending: Internal Medicine | Admitting: Internal Medicine

## 2021-07-19 DIAGNOSIS — D3502 Benign neoplasm of left adrenal gland: Secondary | ICD-10-CM | POA: Insufficient documentation

## 2021-07-19 DIAGNOSIS — K5730 Diverticulosis of large intestine without perforation or abscess without bleeding: Secondary | ICD-10-CM | POA: Diagnosis not present

## 2021-08-07 DIAGNOSIS — I10 Essential (primary) hypertension: Secondary | ICD-10-CM | POA: Diagnosis not present

## 2021-08-07 DIAGNOSIS — E782 Mixed hyperlipidemia: Secondary | ICD-10-CM | POA: Diagnosis not present

## 2021-09-07 DIAGNOSIS — E782 Mixed hyperlipidemia: Secondary | ICD-10-CM | POA: Diagnosis not present

## 2021-09-07 DIAGNOSIS — I10 Essential (primary) hypertension: Secondary | ICD-10-CM | POA: Diagnosis not present

## 2021-09-13 DIAGNOSIS — R7303 Prediabetes: Secondary | ICD-10-CM | POA: Diagnosis not present

## 2021-09-13 DIAGNOSIS — E782 Mixed hyperlipidemia: Secondary | ICD-10-CM | POA: Diagnosis not present

## 2021-09-19 DIAGNOSIS — R7303 Prediabetes: Secondary | ICD-10-CM | POA: Diagnosis not present

## 2021-09-19 DIAGNOSIS — M542 Cervicalgia: Secondary | ICD-10-CM | POA: Diagnosis not present

## 2021-09-19 DIAGNOSIS — D3502 Benign neoplasm of left adrenal gland: Secondary | ICD-10-CM | POA: Diagnosis not present

## 2021-09-19 DIAGNOSIS — F17210 Nicotine dependence, cigarettes, uncomplicated: Secondary | ICD-10-CM | POA: Diagnosis not present

## 2021-09-19 DIAGNOSIS — E782 Mixed hyperlipidemia: Secondary | ICD-10-CM | POA: Diagnosis not present

## 2021-09-19 DIAGNOSIS — M5416 Radiculopathy, lumbar region: Secondary | ICD-10-CM | POA: Diagnosis not present

## 2021-09-19 DIAGNOSIS — Z23 Encounter for immunization: Secondary | ICD-10-CM | POA: Diagnosis not present

## 2021-09-19 DIAGNOSIS — J449 Chronic obstructive pulmonary disease, unspecified: Secondary | ICD-10-CM | POA: Diagnosis not present

## 2021-09-19 DIAGNOSIS — R109 Unspecified abdominal pain: Secondary | ICD-10-CM | POA: Diagnosis not present

## 2021-09-19 DIAGNOSIS — G4700 Insomnia, unspecified: Secondary | ICD-10-CM | POA: Diagnosis not present

## 2021-09-19 DIAGNOSIS — E875 Hyperkalemia: Secondary | ICD-10-CM | POA: Diagnosis not present

## 2021-09-26 ENCOUNTER — Encounter: Payer: Self-pay | Admitting: Urology

## 2021-09-26 ENCOUNTER — Ambulatory Visit (INDEPENDENT_AMBULATORY_CARE_PROVIDER_SITE_OTHER): Payer: Medicare Other | Admitting: Urology

## 2021-09-26 VITALS — BP 160/87 | HR 101 | Ht 66.0 in | Wt 180.0 lb

## 2021-09-26 DIAGNOSIS — N401 Enlarged prostate with lower urinary tract symptoms: Secondary | ICD-10-CM

## 2021-09-26 DIAGNOSIS — N138 Other obstructive and reflux uropathy: Secondary | ICD-10-CM

## 2021-09-26 LAB — URINALYSIS, ROUTINE W REFLEX MICROSCOPIC
Bilirubin, UA: NEGATIVE
Glucose, UA: NEGATIVE
Leukocytes,UA: NEGATIVE
Nitrite, UA: NEGATIVE
Protein,UA: NEGATIVE
RBC, UA: NEGATIVE
Specific Gravity, UA: 1.01 (ref 1.005–1.030)
Urobilinogen, Ur: 0.2 mg/dL (ref 0.2–1.0)
pH, UA: 6.5 (ref 5.0–7.5)

## 2021-09-26 LAB — BLADDER SCAN AMB NON-IMAGING: Scan Result: 96

## 2021-09-26 MED ORDER — TAMSULOSIN HCL 0.4 MG PO CAPS: 0.4000 mg | ORAL_CAPSULE | Freq: Every day | ORAL | 11 refills | Status: DC

## 2021-09-26 NOTE — Progress Notes (Signed)
Assessment: 1. BPH with obstruction/lower urinary tract symptoms     Plan: I reviewed the results of the CT scan from July 2023 as noted below. Continue tamsulosin 0.4 mg daily. PSA today. Return to office in 6 months  Chief Complaint: Chief Complaint  Patient presents with   Benign Prostatic Hypertrophy    HPI: Carlos [name] is a 66 y.o. male who presents for continued evaluation of BPH with obstruction.  He was initially seen in 9/22 for abnormal CT findings of the bladder.  He reported a history of left-sided lower abdominal pain.  This was improved after eating.  No association with bowel movements or with voiding.  He was evaluated with CT imaging which showed a left-sided adrenal adenoma measuring 15 x 11 mm, prostate enlargement with a volume of 54 cm, and bladder wall thickening.  He denied any significant lower urinary tract symptoms except nocturia 0-1 time per night.  No dysuria or gross hematuria.  No history of UTIs. AUA score = 3. CT imaging from July 2023 showed a stable left adrenal nodule measuring 15 x 11 mm consistent with an adenoma, bladder wall thickening appears chronic and similar to prior study, enlarged prostate gland.  He returns today for scheduled follow-up.  He has noted some increased lower urinary tract symptoms with intermittent stream, frequency, and nocturia x1.  He was recently started on tamsulosin 0.4 mg daily.  He has noted improvement in his urinary symptoms with the medication.  No side effects.  No dysuria or gross hematuria. IPSS = 8  QOL = 0/6.    Portions of the above documentation were copied from a prior visit for review purposes only.  Allergies: Allergies  Allergen Reactions   Ciprofloxacin Swelling and Other (See Comments)    Swelling in hands, numbness in arms and joint pain   Gabapentin Other (See Comments)    Double vision    PMH: Past Medical History:  Diagnosis Date   Anxiety    Cancer ([place])    Skin cancer left  shoulder   Chronic low back pain    Dermatofibrosarcoma protubera of right shoulder    Hyperlipidemia     PSH: Past Surgical History:  Procedure Laterality Date   APPENDECTOMY     APPLICATION OF A-CELL OF EXTREMITY Right 07/09/2012   Procedure: PLACEMENT OF A-CELL TO UPPER EXTREMITY/PLACEMENT OF VAC;  Surgeon: Theodoro Kos, DO;  Location: [place];  Service: Plastics;  Laterality: Right;   COLONOSCOPY WITH PROPOFOL N/A 09/30/2015   Procedure: COLONOSCOPY WITH PROPOFOL;  Surgeon: Rogene Houston, MD;  [facility];  Service: Endoscopy;  Laterality: N/A;  [place] RIGHT SHOULDER  2 - 3 WKS AGO   INGUINAL HERNIA REPAIR Left 1999   KNEE ARTHROSCOPY Left 2012   POLYPECTOMY  09/30/2015   Procedure: POLYPECTOMY;  Surgeon: Rogene Houston, MD;  [facility];  Service: Endoscopy;;  sigmoid    SH: Social History   Tobacco Use   Smoking status: Every Day    Packs/day: 1.00    Years: 40.00    Total pack years: 40.00    Types: Cigarettes   Smokeless tobacco: Never  Substance Use Topics   Alcohol use: No   Drug use: No    ROS: Constitutional:  Negative for fever, chills, weight loss CV: Negative for chest pain, previous MI, hypertension Respiratory:  Negative for shortness of breath, wheezing, sleep apnea, frequent cough GI:  Negative for  nausea, vomiting, bloody stool, GERD  PE: BP (!) 160/87   Pulse (!) 101   Ht '5\' 6"'$  (1.676 m)   Wt 180 lb (81.6 kg)   BMI 29.05 kg/m  GENERAL APPEARANCE:  Well appearing, well developed, well nourished, NAD HEENT:  Atraumatic, normocephalic, oropharynx clear NECK:  Supple without lymphadenopathy or thyromegaly ABDOMEN:  Soft, non-tender, no masses EXTREMITIES:  Moves all extremities well, without clubbing, cyanosis, or edema NEUROLOGIC:  Alert and oriented x 3, normal gait, CN II-XII grossly intact MENTAL STATUS:  appropriate BACK:  Non-tender to palpation, No  CVAT SKIN:  Warm, dry, and intact GU: Prostate: 40 g, nontender, no nodules Rectum: Normal tone,  no masses or tenderness   Results: UA dipstick with trace ketones otherwise negative  PVR:  96 ml

## 2021-09-26 NOTE — Progress Notes (Signed)
post void residual= 96mL ° °

## 2021-09-27 LAB — PSA: Prostate Specific Ag, Serum: 4.9 ng/mL — ABNORMAL HIGH (ref 0.0–4.0)

## 2021-10-02 ENCOUNTER — Telehealth: Payer: Self-pay

## 2021-10-02 DIAGNOSIS — R972 Elevated prostate specific antigen [PSA]: Secondary | ICD-10-CM

## 2021-10-02 NOTE — Telephone Encounter (Signed)
Called patient to scheduled prostate biopsy per MD.  Patient was unable to talk at the time so he wanted to go ahead and schedule biopsy and f/u and requested a call back this afternoon to confirm apt dates and go over biopsy instructions.  Instructions also printed out and mailed to patient to have on hand.

## 2021-10-02 NOTE — Telephone Encounter (Signed)
Prostate biopsy and f/u scheduled and confirmed with patient.  I also went over instructions with patient and sent out letter.

## 2021-10-02 NOTE — Telephone Encounter (Signed)
-----   Message from Primus Bravo, MD sent at 10/02/2021 10:47 AM EDT ----- Please schedule patient for TRUS/BX He will need to hold meloxicam for 7 days prior to biopsy.

## 2021-10-07 DIAGNOSIS — E782 Mixed hyperlipidemia: Secondary | ICD-10-CM | POA: Diagnosis not present

## 2021-10-07 DIAGNOSIS — I10 Essential (primary) hypertension: Secondary | ICD-10-CM | POA: Diagnosis not present

## 2021-10-10 ENCOUNTER — Ambulatory Visit (HOSPITAL_COMMUNITY)
Admission: RE | Admit: 2021-10-10 | Discharge: 2021-10-10 | Disposition: A | Discharge status: Home / Self Care | Payer: Medicare Other | Source: Ambulatory Visit | Attending: Urology | Admitting: Urology

## 2021-10-10 ENCOUNTER — Other Ambulatory Visit: Payer: Self-pay | Admitting: Urology

## 2021-10-10 ENCOUNTER — Encounter (HOSPITAL_COMMUNITY): Payer: Self-pay

## 2021-10-10 ENCOUNTER — Ambulatory Visit (HOSPITAL_BASED_OUTPATIENT_CLINIC_OR_DEPARTMENT_OTHER): Payer: Medicare Other | Admitting: Urology

## 2021-10-10 DIAGNOSIS — R972 Elevated prostate specific antigen [PSA]: Secondary | ICD-10-CM

## 2021-10-10 DIAGNOSIS — N401 Enlarged prostate with lower urinary tract symptoms: Secondary | ICD-10-CM | POA: Insufficient documentation

## 2021-10-10 DIAGNOSIS — C61 Malignant neoplasm of prostate: Secondary | ICD-10-CM

## 2021-10-10 DIAGNOSIS — N138 Other obstructive and reflux uropathy: Secondary | ICD-10-CM

## 2021-10-10 MED ORDER — LIDOCAINE HCL (PF) 1 % IJ SOLN
INTRAMUSCULAR | Status: AC
  Administered 2021-10-10: 2.1 mL
  Filled 2021-10-10: qty 5

## 2021-10-10 MED ORDER — LIDOCAINE HCL (PF) 1 % IJ SOLN: 2.1000 mL | Freq: Once | INTRAMUSCULAR | Status: AC

## 2021-10-10 MED ORDER — LIDOCAINE HCL (PF) 2 % IJ SOLN
INTRAMUSCULAR | Status: AC
  Filled 2021-10-10: qty 10

## 2021-10-10 MED ORDER — LIDOCAINE HCL (PF) 2 % IJ SOLN: 10.0000 mL | Freq: Once | INTRAMUSCULAR | Status: AC

## 2021-10-10 MED ORDER — CEFTRIAXONE SODIUM 1 G IJ SOLR
INTRAMUSCULAR | Status: AC
  Administered 2021-10-10: 1 g via INTRAMUSCULAR
  Filled 2021-10-10: qty 10

## 2021-10-10 MED ORDER — CEFTRIAXONE SODIUM 1 G IJ SOLR: 1.0000 g | Freq: Once | INTRAMUSCULAR | Status: AC

## 2021-10-10 MED ORDER — LIDOCAINE HCL (PF) 2 % IJ SOLN
INTRAMUSCULAR | Status: AC
  Administered 2021-10-10: 10 mL
  Filled 2021-10-10: qty 10

## 2021-10-10 NOTE — Progress Notes (Signed)
PT tolerated prostate biopsy procedure and antibiotic injection well today. Labs obtained and sent for pathology. PT ambulatory at discharge with no acute distress noted and verbalized understanding of discharge instructions. PT to follow up with urologist as scheduled on 10/18/21 '@3'$ :30pm.

## 2021-10-10 NOTE — Progress Notes (Signed)
Assessment: 1. Elevated PSA   2. BPH with obstruction/lower urinary tract symptoms    Plan: Post biopsy instructions given Return to office in 7-10 days for biopsy results  Chief Complaint: Chief Complaint  Patient presents with   Prostate Biopsy    HPI: Carlos [name] is a 66 y.o. male who presents for continued evaluation of elevated PSA with prostate biopsy.  He has been seen for BPH with obstruction.  He was initially seen in 9/22 for abnormal CT findings of the bladder.  He reported a history of left-sided lower abdominal pain.  This was improved after eating.  No association with bowel movements or with voiding.  He was evaluated with CT imaging which showed a left-sided adrenal adenoma measuring 15 x 11 mm, prostate enlargement with a volume of 54 cm, and bladder wall thickening.  He denied any significant lower urinary tract symptoms except nocturia 0-1 time per night.  No dysuria or gross hematuria.  No history of UTIs. AUA score = 3. CT imaging from July 2023 showed a stable left adrenal nodule measuring 15 x 11 mm consistent with an adenoma, bladder wall thickening appears chronic and similar to prior study, enlarged prostate gland. At his visit in September 2023, he noted some increased lower urinary tract symptoms with intermittent stream, frequency, and nocturia x1.  He was recently started on tamsulosin 0.4 mg daily with noted improvement in his urinary symptoms with the medication.  No side effects.  No dysuria or gross hematuria. IPSS = 8  QOL = 0/6.  PSA from 9/23:  4.9 No prior biopsy. He presents today for TRUS/BX.    Portions of the above documentation were copied from a prior visit for review purposes only.  Allergies: Allergies  Allergen Reactions   Ciprofloxacin Swelling and Other (See Comments)    Swelling in hands, numbness in arms and joint pain   Gabapentin Other (See Comments)    Double vision    PMH: Past Medical History:  Diagnosis Date    Anxiety    Cancer ([place])    Skin cancer left shoulder   Chronic low back pain    Dermatofibrosarcoma protubera of right shoulder    Hyperlipidemia     PSH: Past Surgical History:  Procedure Laterality Date   APPENDECTOMY     APPLICATION OF A-CELL OF EXTREMITY Right 07/09/2012   Procedure: PLACEMENT OF A-CELL TO UPPER EXTREMITY/PLACEMENT OF VAC;  Surgeon: Theodoro Kos, DO;  Location: [place];  Service: Plastics;  Laterality: Right;   COLONOSCOPY WITH PROPOFOL N/A 09/30/2015   Procedure: COLONOSCOPY WITH PROPOFOL;  Surgeon: Rogene Houston, MD;  [facility];  Service: Endoscopy;  Laterality: N/A;  [place] RIGHT SHOULDER  2 - 3 WKS AGO   INGUINAL HERNIA REPAIR Left 1999   KNEE ARTHROSCOPY Left 2012   POLYPECTOMY  09/30/2015   Procedure: POLYPECTOMY;  Surgeon: Rogene Houston, MD;  [facility];  Service: Endoscopy;;  sigmoid    SH: Social History   Tobacco Use   Smoking status: Every Day    Packs/day: 1.00    Years: 40.00    Total pack years: 40.00    Types: Cigarettes   Smokeless tobacco: Never  Vaping Use   Vaping Use: Never used  Substance Use Topics   Alcohol use: No   Drug use: No    ROS: Constitutional:  Negative for fever, chills, weight loss CV: Negative for chest pain, previous  MI, hypertension Respiratory:  Negative for shortness of breath, wheezing, sleep apnea, frequent cough GI:  Negative for nausea, vomiting, bloody stool, GERD  PE: GENERAL APPEARANCE:  Well appearing, well developed, well nourished, NAD HEENT:  Atraumatic, normocephalic, oropharynx clear NECK:  Supple without lymphadenopathy or thyromegaly ABDOMEN:  Soft, non-tender, no masses EXTREMITIES:  Moves all extremities well, without clubbing, cyanosis, or edema NEUROLOGIC:  Alert and oriented x 3, normal gait, CN II-XII grossly intact MENTAL STATUS:  appropriate BACK:  Non-tender to palpation, No CVAT SKIN:  Warm,  dry, and intact   Results: None  TRANSRECTAL ULTRASOUND AND PROSTATE BIOPSY  Indication:  Elevated PSA  Prophylactic antibiotic administration: Rocephin  All medications that could result in increased bleeding were discontinued within an appropriate period of the time of biopsy.  Risk including bleeding and infection were discussed.  Informed consent was obtained.  The patient was placed in the left lateral decubitus position.  PROCEDURE 1.  TRANSRECTAL ULTRASOUND OF THE PROSTATE  The 7 MHz transrectal probe was used to image the prostate.  Anal stenosis was not noted.  TRUS volume: 69.33 ml  Hypoechoic areas: None  Hyperechoic areas: Right apex  Central calcifications: present  Margins:  normal   PROCEDURE 2:  PROSTATE BIOPSY  A periprostatic block was performed using 1% lidocaine and transrectal ultrasound guidance. Under transrectal ultrasound guidance, and using the Biopty gun, prostate biopsies were obtained systematically from the apex, mid gland, and base bilaterally.  A total of 12 cores were obtained.  Hemostasis was obtained with gentle pressure on the prostate.  The procedures were well-tolerated.  No significant bleeding was noted at the end of the procedure.  The patient was stable for discharge from the office.

## 2021-10-18 ENCOUNTER — Ambulatory Visit (INDEPENDENT_AMBULATORY_CARE_PROVIDER_SITE_OTHER): Payer: Medicare Other | Admitting: Urology

## 2021-10-18 ENCOUNTER — Encounter: Payer: Self-pay | Admitting: Urology

## 2021-10-18 VITALS — BP 168/90 | HR 91 | Ht 66.0 in | Wt 188.0 lb

## 2021-10-18 DIAGNOSIS — N138 Other obstructive and reflux uropathy: Secondary | ICD-10-CM | POA: Diagnosis not present

## 2021-10-18 DIAGNOSIS — C61 Malignant neoplasm of prostate: Secondary | ICD-10-CM

## 2021-10-18 DIAGNOSIS — N401 Enlarged prostate with lower urinary tract symptoms: Secondary | ICD-10-CM | POA: Diagnosis not present

## 2021-10-18 NOTE — Progress Notes (Signed)
Assessment: 1. Prostate cancer [facility]); PSA 4.9; Gleason 6; low risk   2. BPH with obstruction/lower urinary tract symptoms     Plan: I spent a total of 42 minutes counseling [place] regarding the diagnosis of localized prostate cancer.  I spent the first 20 minutes discussing the biopsy results.  Using the prostate cancer nomogram, I sided a probability of 79 percent for localized prostate cancer, 21 percent of extracapsular extension, 1 percent of seminal vesicle involvement, and 1 percent of lymph node involvement.  I discussed the diagnosis of localized prostate cancer in the natural history of prostate cancer. I then spent the next 22 minutes discussing treatment options for localized prostate cancer.  Specifically, I discussed active surveillance, radical prostatectomy (RALP, RRP, RPP), external beam radiation, low dose rate brachytherapy, cryosurgery, and high intensity frequency ultrasound.  I discussed the risk and benefits of each treatment.  I discussed the potential risk of impotence and incontinence as they relate to treatment of prostate cancer.  Questions were answered.  The patient was given literature regarding prostate cancer to review.   Oncotype Dx sent  Will call with results  Chief Complaint: Chief Complaint  Patient presents with   Results    HPI: Carlos [name] is a 66 y.o. male who presents for discussion of biopsy results and recent diagnosis of prostate cancer.  He has been seen for BPH with obstruction.  He was initially seen in 9/22 for abnormal CT findings of the bladder.  He reported a history of left-sided lower abdominal pain.  This was improved after eating.  No association with bowel movements or with voiding.  He was evaluated with CT imaging which showed a left-sided adrenal adenoma measuring 15 x 11 mm, prostate enlargement with a volume of 54 cm, and bladder wall thickening.  He denied any significant lower urinary tract symptoms except nocturia  0-1 time per night.  No dysuria or gross hematuria.  No history of UTIs. AUA score = 3. CT imaging from July 2023 showed a stable left adrenal nodule measuring 15 x 11 mm consistent with an adenoma, bladder wall thickening appears chronic and similar to prior study, enlarged prostate gland. At his visit in September 2023, he noted some increased lower urinary tract symptoms with intermittent stream, frequency, and nocturia x1.  He was recently started on tamsulosin 0.4 mg daily with noted improvement in his urinary symptoms with the medication.  No side effects.  No dysuria or gross hematuria. IPSS = 8  QOL = 0/6.  He was found to have an elevated PSA. PSA from 9/23:  4.9 No prior biopsy.  He returns today following his transrectal ultrasound and biopsy of the prostate on 10/10/21. TRUS volume:  69.3 ml  PSA density:  0.07 Biopsy results:             Gleason score: 3+ 3 = 6            # positive cores: 1/6 on right    3/6 on left            Location of cancer: mid gland, base  Complications after biopsy: none Patient without significant LUTS.  Patient without erectile dysfunction.       Portions of the above documentation were copied from a prior visit for review purposes only.  Allergies: Allergies  Allergen Reactions   Ciprofloxacin Swelling and Other (See Comments)    Swelling in hands, numbness in arms and joint pain   Gabapentin  Other (See Comments)    Double vision    PMH: Past Medical History:  Diagnosis Date   Anxiety    Cancer ([place])    Skin cancer left shoulder   Chronic low back pain    Dermatofibrosarcoma protubera of right shoulder    Hyperlipidemia     PSH: Past Surgical History:  Procedure Laterality Date   APPENDECTOMY     APPLICATION OF A-CELL OF EXTREMITY Right 07/09/2012   Procedure: PLACEMENT OF A-CELL TO UPPER EXTREMITY/PLACEMENT OF VAC;  Surgeon: Theodoro Kos, DO;  Location: [place];  Service: Plastics;  Laterality: Right;    COLONOSCOPY WITH PROPOFOL N/A 09/30/2015   Procedure: COLONOSCOPY WITH PROPOFOL;  Surgeon: Rogene Houston, MD;  [facility];  Service: Endoscopy;  Laterality: N/A;  [place] RIGHT SHOULDER  2 - 3 WKS AGO   INGUINAL HERNIA REPAIR Left 1999   KNEE ARTHROSCOPY Left 2012   POLYPECTOMY  09/30/2015   Procedure: POLYPECTOMY;  Surgeon: Rogene Houston, MD;  [facility];  Service: Endoscopy;;  sigmoid    SH: Social History   Tobacco Use   Smoking status: Every Day    Packs/day: 1.00    Years: 40.00    Total pack years: 40.00    Types: Cigarettes   Smokeless tobacco: Never  Vaping Use   Vaping Use: Never used  Substance Use Topics   Alcohol use: No   Drug use: No    ROS: Constitutional:  Negative for fever, chills, weight loss CV: Negative for chest pain, previous MI, hypertension Respiratory:  Negative for shortness of breath, wheezing, sleep apnea, frequent cough GI:  Negative for nausea, vomiting, bloody stool, GERD  PE: BP (!) 168/90   Pulse 91   Ht '5\' 6"'$  (1.676 m)   Wt 188 lb (85.3 kg)   BMI 30.34 kg/m  GENERAL APPEARANCE:  Well appearing, well developed, well nourished, NAD HEENT:  Atraumatic, normocephalic, oropharynx clear NECK:  Supple without lymphadenopathy or thyromegaly ABDOMEN:  Soft, non-tender, no masses EXTREMITIES:  Moves all extremities well, without clubbing, cyanosis, or edema NEUROLOGIC:  Alert and oriented x 3, normal gait, CN II-XII grossly intact MENTAL STATUS:  appropriate BACK:  Non-tender to palpation, No CVAT SKIN:  Warm, dry, and intact  Results: None

## 2021-11-06 ENCOUNTER — Encounter: Payer: Self-pay | Admitting: Urology

## 2021-11-07 ENCOUNTER — Telehealth: Payer: Self-pay | Admitting: Urology

## 2021-11-07 NOTE — Telephone Encounter (Signed)
Genomic prostate score = 19, confirming low risk prostate cancer.  This results suggest a 20% likelihood of adverse pathology with a radical prostatectomy, 12% risk of high-grade disease, and 14% risk of nonorgan confined disease. I discussed these results with Carlos [name] by phone today.  I again reviewed options for management of low risk prostate cancer.  He is not interested in radical prostatectomy or external beam radiation therapy.  He would like to proceed with active surveillance at this time.  I think this is a reasonable approach. We will schedule him for a follow-up in 3-4 months.

## 2021-12-19 DIAGNOSIS — R109 Unspecified abdominal pain: Secondary | ICD-10-CM | POA: Diagnosis not present

## 2021-12-19 DIAGNOSIS — I700 Atherosclerosis of aorta: Secondary | ICD-10-CM | POA: Diagnosis not present

## 2021-12-19 DIAGNOSIS — F17210 Nicotine dependence, cigarettes, uncomplicated: Secondary | ICD-10-CM | POA: Diagnosis not present

## 2021-12-19 DIAGNOSIS — D3502 Benign neoplasm of left adrenal gland: Secondary | ICD-10-CM | POA: Diagnosis not present

## 2021-12-19 DIAGNOSIS — G4700 Insomnia, unspecified: Secondary | ICD-10-CM | POA: Diagnosis not present

## 2021-12-19 DIAGNOSIS — M5416 Radiculopathy, lumbar region: Secondary | ICD-10-CM | POA: Diagnosis not present

## 2021-12-19 DIAGNOSIS — K099 Cyst of oral region, unspecified: Secondary | ICD-10-CM | POA: Diagnosis not present

## 2021-12-21 ENCOUNTER — Encounter: Payer: Self-pay | Admitting: Gastroenterology

## 2022-01-29 NOTE — Progress Notes (Deleted)
GI Office Note    Referring Provider: Celene Squibb, MD Primary Care Physician:  Celene Squibb, MD  Primary Gastroenterologist: ***  Chief Complaint   No chief complaint on file.    History of Present Illness   Carlos [name] is a 67 y.o. male presenting today at the request of Celene Squibb, MD for ***abdominal pain.  Last colonoscopy 09/30/2015: -***Fair colon prep -4 mm polyp in sigmoid colon -External hemorrhoids -Pathology revealed tubular adenoma -Repeat TCS in 7 years  PCP sent referral for routine colonoscopy and evaluation of mild abdominal pain.  Reported negative CT.  Today:   Current Outpatient Medications  Medication Sig Dispense Refill   busPIRone (BUSPAR) 5 MG tablet Take 1 tablet by mouth 2 (two) times daily.  0   clonazePAM (KLONOPIN) 1 MG disintegrating tablet Take by mouth.     meloxicam (MOBIC) 15 MG tablet Take 15 mg by mouth daily.     pantoprazole (PROTONIX) 40 MG tablet Take 40 mg by mouth daily.     sertraline (ZOLOFT) 50 MG tablet Take 50 mg by mouth daily.     simvastatin (ZOCOR) 10 MG tablet Take by mouth.     simvastatin (ZOCOR) 40 MG tablet TK 1 T PO QD  2   tamsulosin (FLOMAX) 0.4 MG CAPS capsule Take 1 capsule (0.4 mg total) by mouth daily. 30 capsule 11   No current facility-administered medications for this visit.    Past Medical History:  Diagnosis Date   Anxiety    Cancer ([place])    Skin cancer left shoulder   Chronic low back pain    Dermatofibrosarcoma protubera of right shoulder    Hyperlipidemia     Past Surgical History:  Procedure Laterality Date   APPENDECTOMY     APPLICATION OF A-CELL OF EXTREMITY Right 07/09/2012   Procedure: PLACEMENT OF A-CELL TO UPPER EXTREMITY/PLACEMENT OF VAC;  Surgeon: Theodoro Kos, DO;  Location: [place];  Service: Plastics;  Laterality: Right;   COLONOSCOPY WITH PROPOFOL N/A 09/30/2015   Procedure: COLONOSCOPY WITH PROPOFOL;  Surgeon: Rogene Houston, MD;  [facility];  Service: Endoscopy;  Laterality: N/A;  [place] RIGHT SHOULDER  2 - 3 WKS AGO   INGUINAL HERNIA REPAIR Left 1999   KNEE ARTHROSCOPY Left 2012   POLYPECTOMY  09/30/2015   Procedure: POLYPECTOMY;  Surgeon: Rogene Houston, MD;  [facility];  Service: Endoscopy;;  sigmoid    Family History  Problem Relation Age of Onset   Dementia Mother    Cancer Father     Allergies as of 01/30/2022 - Review Complete 10/18/2021  Allergen Reaction Noted   Ciprofloxacin Swelling and Other (See Comments) 09/23/2015   Gabapentin Other (See Comments) 08/08/2016    Social History   Socioeconomic History   Marital status: Divorced    Spouse name: Mariann Laster   Number of children: 1   Years of education: 10th   Highest education level: Not on file  Occupational History    Employer: [employer]    Comment: [employer]  Tobacco Use   Smoking status: Every Day    Packs/day: 1.00    Years: 40.00    Total pack years: 40.00    Types: Cigarettes   Smokeless tobacco: Never  Vaping Use   Vaping Use: Never used  Substance and Sexual Activity   Alcohol use: No   Drug use: No   Sexual  activity: Not on file  Other Topics Concern   Not on file  Social History Narrative   Patient lives at home with his wife Mariann Laster).   Patient has one child.   Patient is working full-time but not right now because of his back.   Patient has 10 grade education.   Patient is right-handed.   Caffeine Use: 1 cup of coffee; 3-4 sodas daily   Social Determinants of Health   Financial Resource Strain: Not on file  Food Insecurity: Not on file  Transportation Needs: Not on file  Physical Activity: Not on file  Stress: Not on file  Social Connections: Not on file  Intimate Partner Violence: Not on file     Review of Systems   Gen: Denies any fever, chills, fatigue, weight loss, lack of appetite.  CV: Denies chest pain, heart palpitations,  peripheral edema, syncope.  Resp: Denies shortness of breath at rest or with exertion. Denies wheezing or cough.  GI: see HPI GU : Denies urinary burning, urinary frequency, urinary hesitancy MS: Denies joint pain, muscle weakness, cramps, or limitation of movement.  Derm: Denies rash, itching, dry skin Psych: Denies depression, anxiety, memory loss, and confusion Heme: Denies bruising, bleeding, and enlarged lymph nodes.   Physical Exam   There were no vitals taken for this visit.  General:   Alert and oriented. Pleasant and cooperative. Well-nourished and well-developed.  Head:  Normocephalic and atraumatic. Eyes:  Without icterus, sclera clear and conjunctiva pink.  Ears:  Normal auditory acuity. Mouth:  No deformity or lesions, oral mucosa pink.  Lungs:  Clear to auscultation bilaterally. No wheezes, rales, or rhonchi. No distress.  Heart:  S1, S2 present without murmurs appreciated.  Abdomen:  +BS, soft, non-tender and non-distended. No HSM noted. No guarding or rebound. No masses appreciated.  Rectal:  Deferred  Msk:  Symmetrical without gross deformities. Normal posture. Extremities:  Without edema. Neurologic:  Alert and  oriented x4;  grossly normal neurologically. Skin:  Intact without significant lesions or rashes. Psych:  Alert and cooperative. Normal mood and affect.   Assessment   Carlos [name] is a 67 y.o. male with a history of *** presenting today with      PLAN   ***    Venetia Night, MSN, FNP-BC, AGACNP-BC [facility]

## 2022-01-30 ENCOUNTER — Ambulatory Visit: Payer: Medicare Other | Admitting: Gastroenterology

## 2022-03-16 DIAGNOSIS — R7303 Prediabetes: Secondary | ICD-10-CM | POA: Diagnosis not present

## 2022-03-16 DIAGNOSIS — I10 Essential (primary) hypertension: Secondary | ICD-10-CM | POA: Diagnosis not present

## 2022-03-16 DIAGNOSIS — E782 Mixed hyperlipidemia: Secondary | ICD-10-CM | POA: Diagnosis not present

## 2022-03-20 DIAGNOSIS — M5416 Radiculopathy, lumbar region: Secondary | ICD-10-CM | POA: Diagnosis not present

## 2022-03-20 DIAGNOSIS — F17210 Nicotine dependence, cigarettes, uncomplicated: Secondary | ICD-10-CM | POA: Diagnosis not present

## 2022-03-20 DIAGNOSIS — Z0001 Encounter for general adult medical examination with abnormal findings: Secondary | ICD-10-CM | POA: Diagnosis not present

## 2022-03-20 DIAGNOSIS — D3502 Benign neoplasm of left adrenal gland: Secondary | ICD-10-CM | POA: Diagnosis not present

## 2022-03-20 DIAGNOSIS — J449 Chronic obstructive pulmonary disease, unspecified: Secondary | ICD-10-CM | POA: Diagnosis not present

## 2022-03-20 DIAGNOSIS — E875 Hyperkalemia: Secondary | ICD-10-CM | POA: Diagnosis not present

## 2022-03-20 DIAGNOSIS — R7303 Prediabetes: Secondary | ICD-10-CM | POA: Diagnosis not present

## 2022-03-20 DIAGNOSIS — E782 Mixed hyperlipidemia: Secondary | ICD-10-CM | POA: Diagnosis not present

## 2022-03-20 DIAGNOSIS — R109 Unspecified abdominal pain: Secondary | ICD-10-CM | POA: Diagnosis not present

## 2022-03-20 DIAGNOSIS — Z0000 Encounter for general adult medical examination without abnormal findings: Secondary | ICD-10-CM | POA: Diagnosis not present

## 2022-04-06 ENCOUNTER — Ambulatory Visit: Payer: Medicare Other | Admitting: Urology

## 2022-04-09 ENCOUNTER — Other Ambulatory Visit (HOSPITAL_COMMUNITY): Payer: Self-pay | Admitting: Internal Medicine

## 2022-04-09 DIAGNOSIS — J449 Chronic obstructive pulmonary disease, unspecified: Secondary | ICD-10-CM

## 2022-04-10 ENCOUNTER — Ambulatory Visit (HOSPITAL_COMMUNITY)
Admission: RE | Admit: 2022-04-10 | Discharge: 2022-04-10 | Disposition: A | Discharge status: Home / Self Care | Payer: Medicare Other | Source: Ambulatory Visit | Attending: Internal Medicine | Admitting: Internal Medicine

## 2022-04-10 DIAGNOSIS — J449 Chronic obstructive pulmonary disease, unspecified: Secondary | ICD-10-CM | POA: Diagnosis not present

## 2022-04-10 DIAGNOSIS — R0602 Shortness of breath: Secondary | ICD-10-CM | POA: Diagnosis not present

## 2022-04-11 ENCOUNTER — Other Ambulatory Visit (HOSPITAL_COMMUNITY): Payer: Self-pay | Admitting: Internal Medicine

## 2022-04-11 DIAGNOSIS — R918 Other nonspecific abnormal finding of lung field: Secondary | ICD-10-CM

## 2022-04-12 ENCOUNTER — Ambulatory Visit: Payer: Medicare Other | Admitting: Urology

## 2022-04-12 ENCOUNTER — Encounter: Payer: Self-pay | Admitting: Urology

## 2022-04-12 VITALS — BP 178/92 | HR 120 | Ht 66.0 in | Wt 182.0 lb

## 2022-04-12 DIAGNOSIS — C61 Malignant neoplasm of prostate: Secondary | ICD-10-CM

## 2022-04-12 DIAGNOSIS — N138 Other obstructive and reflux uropathy: Secondary | ICD-10-CM

## 2022-04-12 DIAGNOSIS — N401 Enlarged prostate with lower urinary tract symptoms: Secondary | ICD-10-CM | POA: Diagnosis not present

## 2022-04-12 NOTE — Progress Notes (Signed)
Assessment: 1. Prostate cancer [facility]); PSA 4.9; Gleason 6; low risk; on active surveillance   2. BPH with obstruction/lower urinary tract symptoms     Plan: PSA today Return to office in 4 months with PSA Discussed need for confirmatory biopsy in October 2024 Recommend GI evaluation of abdominal pain  Chief Complaint: Chief Complaint  Patient presents with   Prostate Cancer    HPI: Carlos [name] is a 67 y.o. male who presents for continued evaluation of low risk prostate cancer.  He has been seen for BPH with obstruction.  He was initially seen in 9/22 for abnormal CT findings of the bladder.  He reported a history of left-sided lower abdominal pain.  This was improved after eating.  No association with bowel movements or with voiding.  He was evaluated with CT imaging which showed a left-sided adrenal adenoma measuring 15 x 11 mm, prostate enlargement with a volume of 54 cm, and bladder wall thickening.  He denied any significant lower urinary tract symptoms except nocturia 0-1 time per night.  No dysuria or gross hematuria.  No history of UTIs. AUA score = 3. CT imaging from July 2023 showed a stable left adrenal nodule measuring 15 x 11 mm consistent with an adenoma, bladder wall thickening appears chronic and similar to prior study, enlarged prostate gland. At his visit in September 2023, he noted some increased lower urinary tract symptoms with intermittent stream, frequency, and nocturia x1.  He was recently started on tamsulosin 0.4 mg daily with noted improvement in his urinary symptoms with the medication.  No side effects.  No dysuria or gross hematuria. IPSS = 8  QOL = 0/6.  He was found to have an elevated PSA. PSA from 9/23:  4.9 No prior biopsy.  He underwent transrectal ultrasound and biopsy of the prostate on 10/10/21. TRUS volume:  69.3 ml  PSA density:  0.07 Biopsy results:             Gleason score: 3+ 3 = 6            # positive cores: 1/6 on right    3/6 on  left            Location of cancer: mid gland, base  Complications after biopsy: none  Genomic prostate score was 19 indicating a low likelihood of adverse pathology. He elected to proceed with active surveillance.  He returns today for follow-up.  His urinary symptoms are stable.  He reports some straining to void and urgency.  No dysuria or gross hematuria. IPSS = 6 today. He reports pain in the epigastric and left lower quadrant.  This pain has been present for approximately 6 months. He was referred to gastroenterology in December 2023 but it does not appear that he has been evaluated. He had a recent chest x-ray which showed a large area of opacity in the right midlung concerning for either pneumonia or tumor.  He is scheduled to undergo further evaluation with a CT of his chest tomorrow.   Portions of the above documentation were copied from a prior visit for review purposes only.  Allergies: Allergies  Allergen Reactions   Ciprofloxacin Swelling and Other (See Comments)    Swelling in hands, numbness in arms and joint pain   Gabapentin Other (See Comments)    Double vision    PMH: Past Medical History:  Diagnosis Date   Anxiety    Cancer ([place])    Skin cancer left shoulder  Chronic low back pain    Dermatofibrosarcoma protubera of right shoulder    Hyperlipidemia     PSH: Past Surgical History:  Procedure Laterality Date   APPENDECTOMY     APPLICATION OF A-CELL OF EXTREMITY Right 07/09/2012   Procedure: PLACEMENT OF A-CELL TO UPPER EXTREMITY/PLACEMENT OF VAC;  Surgeon: Theodoro Kos, DO;  Location: [facility];  Service: Plastics;  Laterality: Right;   COLONOSCOPY WITH PROPOFOL N/A 09/30/2015   Procedure: COLONOSCOPY WITH PROPOFOL;  Surgeon: Rogene Houston, MD;  [facility];  Service: Endoscopy;  Laterality: N/A;  [place] RIGHT SHOULDER  2 - 3 WKS AGO   INGUINAL HERNIA REPAIR Left 1999   KNEE  ARTHROSCOPY Left 2012   POLYPECTOMY  09/30/2015   Procedure: POLYPECTOMY;  Surgeon: Rogene Houston, MD;  [facility];  Service: Endoscopy;;  sigmoid    SH: Social History   Tobacco Use   Smoking status: Every Day    Packs/day: 1.00    Years: 40.00    Additional pack years: 0.00    Total pack years: 40.00    Types: Cigarettes   Smokeless tobacco: Never  Vaping Use   Vaping Use: Never used  Substance Use Topics   Alcohol use: No   Drug use: No    ROS: Constitutional:  Negative for fever, chills, weight loss CV: Negative for chest pain, previous MI, hypertension Respiratory:  Negative for shortness of breath, wheezing, sleep apnea, frequent cough GI:  Negative for nausea, vomiting, bloody stool, GERD  PE: BP (!) 178/92   Pulse (!) 120   Ht 5\' 6"  (1.676 m)   Wt 182 lb (82.6 kg)   BMI 29.38 kg/m  GENERAL APPEARANCE:  Well appearing, well developed, well nourished, NAD HEENT:  Atraumatic, normocephalic, oropharynx clear NECK:  Supple without lymphadenopathy or thyromegaly ABDOMEN:  Soft, non-tender, no masses EXTREMITIES:  Moves all extremities well, without clubbing, cyanosis, or edema NEUROLOGIC:  Alert and oriented x 3, normal gait, CN II-XII grossly intact MENTAL STATUS:  appropriate BACK:  Non-tender to palpation, No CVAT SKIN:  Warm, dry, and intact GU: Prostate: 60 g, NT, no nodules Rectum: Normal tone,  no masses or tenderness   Results: U/A:

## 2022-04-12 NOTE — Addendum Note (Signed)
Addended by: Evelina Bucy on: 04/12/2022 02:36 PM   Modules accepted: Orders

## 2022-04-13 ENCOUNTER — Ambulatory Visit (HOSPITAL_COMMUNITY)
Admission: RE | Admit: 2022-04-13 | Discharge: 2022-04-13 | Disposition: A | Discharge status: Home / Self Care | Payer: Medicare Other | Source: Ambulatory Visit | Attending: Internal Medicine | Admitting: Internal Medicine

## 2022-04-13 ENCOUNTER — Encounter (HOSPITAL_COMMUNITY): Payer: Self-pay

## 2022-04-13 DIAGNOSIS — R918 Other nonspecific abnormal finding of lung field: Secondary | ICD-10-CM | POA: Diagnosis not present

## 2022-04-13 DIAGNOSIS — J432 Centrilobular emphysema: Secondary | ICD-10-CM | POA: Diagnosis not present

## 2022-04-13 LAB — PSA: Prostate Specific Ag, Serum: 4.7 ng/mL — ABNORMAL HIGH (ref 0.0–4.0)

## 2022-04-13 MED ORDER — IOHEXOL 300 MG/ML  SOLN
100.0000 mL | Freq: Once | INTRAMUSCULAR | Status: AC | PRN
  Administered 2022-04-13: 75 mL via INTRAVENOUS

## 2022-04-17 NOTE — Progress Notes (Signed)
[facility] [address] [place], [place] [address]   Clinic Day:  04/17/2022  Referring physician: Benita [name], Carlos Z, MD  Patient Care Team: Carlos [name], Carlos Z, MD as PCP - General (Internal Medicine)   ASSESSMENT & PLAN:   Assessment: ***  Plan: ***  No orders of the defined types were placed in this encounter.     I,Carlos [name],acting as a Neurosurgeonscribe for Carlos MassedSreedhar Nasiah Lehenbauer, MD.,have documented all relevant documentation on the behalf of Carlos MassedSreedhar Geovonni Meyerhoff, MD,as directed by  Carlos MassedSreedhar Marybell Robards, MD while in the presence of Carlos MassedSreedhar Rasool Rommel, MD.   ***  Carlos [name]   4/9/20247:45 PM  CHIEF COMPLAINT/PURPOSE OF CONSULT:   Diagnosis: RUL lung mass  Cancer Staging  No matching staging information was found for the patient.   Prior Therapy: none  Current Therapy:  ***   HISTORY OF PRESENT ILLNESS:   Oncology History   No history exists.      Carlos [name] is a 67 y.o. male presenting to clinic today for evaluation of RUL lung mass at the request of Carlos [name].  He has a 50+ year history of smoking at least 1 ppd. He has COPD and has an albuterol inhaler to use as needed. He underwent chest x-ray on 04/10/22 showing: bronchitic changes with large area of opacity in mid right lung. This was further evaluated with chest CT on 04/13/22 showing: 9.2 cm right upper lobe mass with lobulations appearing to extend across minor fissure into right middle lobe and across major fissue into right lower lobe; 1.6 cm potential satellite lesion in right upper lobe; borderline prominent 0.9 cm lower right paratracheal lymph node.  Of note, he was also recently diagnosed with Gleason 6 prostate cancer in 10/2021 by Dr. Pete [name]. He is on active surveillance.  Today, he states that he is doing well overall. His appetite level is at ***%. His energy level is at ***%.  PAST MEDICAL HISTORY:   Past Medical History: Past Medical History:  Diagnosis Date   Anxiety    Cancer     Skin cancer left shoulder   Chronic low back pain    Dermatofibrosarcoma protubera of right shoulder    Hyperlipidemia     Surgical History: Past Surgical History:  Procedure Laterality Date   APPENDECTOMY     APPLICATION OF A-CELL OF EXTREMITY Right 07/09/2012   Procedure: PLACEMENT OF A-CELL TO UPPER EXTREMITY/PLACEMENT OF VAC;  Surgeon: Wayland Denislaire Sanger, DO;  Location: [facility];  Service: Plastics;  Laterality: Right;   COLONOSCOPY WITH PROPOFOL N/A 09/30/2015   Procedure: COLONOSCOPY WITH PROPOFOL;  Surgeon: Malissa HippoNajeeb U Rehman, MD;  [facility];  Service: Endoscopy;  Laterality: N/A;  830   EXCISION DERMATOFIBROCARCOMA PROTUBERAN RIGHT SHOULDER  2 - 3 WKS AGO   INGUINAL HERNIA REPAIR Left 1999   KNEE ARTHROSCOPY Left 2012   POLYPECTOMY  09/30/2015   Procedure: POLYPECTOMY;  Surgeon: Malissa HippoNajeeb U Rehman, MD;  [facility];  Service: Endoscopy;;  sigmoid    Social History: Social History   Socioeconomic History   Marital status: Divorced    Spouse name: Carlos [name]   Number of children: 1   Years of education: 10th   Highest education level: Not on file  Occupational History    Employer: [employer]    Comment: [employer]  Tobacco Use   Smoking status: Every Day    Packs/day: 1.00    Years: 40.00    Additional pack years: 0.00  Total pack years: 40.00    Types: Cigarettes   Smokeless tobacco: Never  Vaping Use   Vaping Use: Never used  Substance and Sexual Activity   Alcohol use: No   Drug use: No   Sexual activity: Not on file  Other Topics Concern   Not on file  Social History Narrative   Patient lives at home with his wife Carlos Mortimer).   Patient has one child.   Patient is working full-time but not right now because of his back.   Patient has 10 grade education.   Patient is right-handed.   Caffeine Use: 1 cup of coffee; 3-4 sodas daily   Social Determinants of Health   Financial Resource Strain: Not on file   Food Insecurity: Not on file  Transportation Needs: Not on file  Physical Activity: Not on file  Stress: Not on file  Social Connections: Not on file  Intimate Partner Violence: Not on file    Family History: Family History  Problem Relation Age of Onset   Dementia Mother    Cancer Father     Current Medications:  Current Outpatient Medications:    busPIRone (BUSPAR) 5 MG tablet, Take 1 tablet by mouth 2 (two) times daily., Disp: , Rfl: 0   clonazePAM (KLONOPIN) 1 MG disintegrating tablet, Take by mouth., Disp: , Rfl:    meloxicam (MOBIC) 15 MG tablet, Take 15 mg by mouth daily., Disp: , Rfl:    pantoprazole (PROTONIX) 40 MG tablet, Take 40 mg by mouth daily., Disp: , Rfl:    sertraline (ZOLOFT) 50 MG tablet, Take 50 mg by mouth daily., Disp: , Rfl:    simvastatin (ZOCOR) 10 MG tablet, Take by mouth., Disp: , Rfl:    simvastatin (ZOCOR) 40 MG tablet, TK 1 T PO QD, Disp: , Rfl: 2   tamsulosin (FLOMAX) 0.4 MG CAPS capsule, Take 1 capsule (0.4 mg total) by mouth daily., Disp: 30 capsule, Rfl: 11   Allergies: Allergies  Allergen Reactions   Ciprofloxacin Swelling and Other (See Comments)    Swelling in hands, numbness in arms and joint pain   Gabapentin Other (See Comments)    Double vision    REVIEW OF SYSTEMS:   Review of Systems  Constitutional:  Negative for chills, fatigue and fever.  HENT:   Negative for lump/mass, mouth sores, nosebleeds, sore throat and trouble swallowing.   Eyes:  Negative for eye problems.  Respiratory:  Negative for cough and shortness of breath.   Cardiovascular:  Negative for chest pain, leg swelling and palpitations.  Gastrointestinal:  Negative for abdominal pain, constipation, diarrhea, nausea and vomiting.  Genitourinary:  Negative for bladder incontinence, difficulty urinating, dysuria, frequency, hematuria and nocturia.   Musculoskeletal:  Negative for arthralgias, back pain, flank pain, myalgias and neck pain.  Skin:  Negative for  itching and rash.  Neurological:  Negative for dizziness, headaches and numbness.  Hematological:  Does not bruise/bleed easily.  Psychiatric/Behavioral:  Negative for depression, sleep disturbance and suicidal ideas. The patient is not nervous/anxious.   All other systems reviewed and are negative.    VITALS:   There were no vitals taken for this visit.  Wt Readings from Last 3 Encounters:  04/12/22 182 lb (82.6 kg)  10/18/21 188 lb (85.3 kg)  09/26/21 180 lb (81.6 kg)    There is no height or weight on file to calculate BMI.  Performance status (ECOG): {CHL ONC [id number]  PHYSICAL EXAM:   Physical Exam Vitals and nursing note reviewed. Exam  conducted with a chaperone present.  Constitutional:      Appearance: Normal appearance.  Cardiovascular:     Rate and Rhythm: Normal rate and regular rhythm.     Pulses: Normal pulses.     Heart sounds: Normal heart sounds.  Pulmonary:     Effort: Pulmonary effort is normal.     Breath sounds: Normal breath sounds.  Abdominal:     Palpations: Abdomen is soft. There is no hepatomegaly, splenomegaly or mass.     Tenderness: There is no abdominal tenderness.  Musculoskeletal:     Right lower leg: No edema.     Left lower leg: No edema.  Lymphadenopathy:     Cervical: No cervical adenopathy.     Right cervical: No superficial, deep or posterior cervical adenopathy.    Left cervical: No superficial, deep or posterior cervical adenopathy.     Upper Body:     Right upper body: No supraclavicular or axillary adenopathy.     Left upper body: No supraclavicular or axillary adenopathy.  Neurological:     General: No focal deficit present.     Mental Status: He is alert and oriented to person, place, and time.  Psychiatric:        Mood and Affect: Mood normal.        Behavior: Behavior normal.     LABS:      Latest Ref Rng & Units 06/17/2016   11:10 AM 09/23/2015   12:28 PM 07/09/2012    8:38 AM  CBC  WBC 4.0 - 10.5 K/uL 13.6   11.4    Hemoglobin 13.0 - 17.0 g/dL 16.1  09.6  04.5   Hematocrit 39.0 - 52.0 % 42.6  42.7    Platelets 150 - 400 K/uL 342  367        Latest Ref Rng & Units 06/17/2016   11:10 AM 09/23/2015   12:28 PM  CMP  Glucose 65 - 99 mg/dL 409  95   BUN 6 - 20 mg/dL 6  10   Creatinine 8.11 - 1.24 mg/dL 9.14  7.82   Sodium 956 - 145 mmol/L 132  137   Potassium 3.5 - 5.1 mmol/L 3.8  4.4   Chloride 101 - 111 mmol/L 99  102   CO2 22 - 32 mmol/L 23  28   Calcium 8.9 - 10.3 mg/dL 8.8  9.2   Total Protein 6.5 - 8.1 g/dL 7.0    Total Bilirubin 0.3 - 1.2 mg/dL 0.7    Alkaline Phos 38 - 126 U/L 49    AST 15 - 41 U/L 21    ALT 17 - 63 U/L 21       No results found for: "CEA1", "CEA" / No results found for: "CEA1", "CEA" Lab Results  Component Value Date   PSA1 4.7 (H) 04/12/2022   No results found for: "OZH086" No results found for: "CAN125"  No results found for: "TOTALPROTELP", "ALBUMINELP", "A1GS", "A2GS", "BETS", "BETA2SER", "GAMS", "MSPIKE", "SPEI" No results found for: "TIBC", "FERRITIN", "IRONPCTSAT" No results found for: "LDH"   STUDIES:   CT CHEST W CONTRAST  Result Date: 04/13/2022 CLINICAL DATA:  Right lung mass.  History prostate cancer. * Tracking Code: BO * EXAM: CT CHEST WITH CONTRAST TECHNIQUE: Multidetector CT imaging of the chest was performed during intravenous contrast administration. RADIATION DOSE REDUCTION: This exam was performed according to the departmental dose-optimization program which includes automated exposure control, adjustment of the mA and/or kV according to patient size and/or use  of iterative reconstruction technique. CONTRAST:  75mL OMNIPAQUE IOHEXOL 300 MG/ML  SOLN COMPARISON:  Chest radiograph 04/10/2022 FINDINGS: Cardiovascular: Coronary, aortic arch, and branch vessel atherosclerotic vascular disease. Mediastinum/Nodes: Lower right paratracheal node 0.9 cm in short axis, image 59 series 2. Lungs/Pleura: A mass centered in the right upper lobe measures 9.2  by 5.7 by 6.6 cm, with lobulations appearing to extend across the minor fissure into the right middle lobe, and across the major fissure into the right lower lobe. Interstitial accentuation along the margins of the mass likely from postobstructive pneumonitis. Potential satellite lesion in the right upper lobe measures 1.6 by 1.4 by 1.4 cm. Small calcified nodules in the right middle lobe and right lower lobe compatible with old granulomatous disease. Noncalcified right lower lobe nodule 0.4 cm in diameter on image 83 series 4. Centrilobular emphysema. Partially calcified 0.4 cm left lower lobe nodule adjacent to the diaphragm, probably a benign granuloma. Similar 2 mm nodule on image 119 series 4. Other small calcified granulomas in the left lung are present. No pleural effusion is identified. Upper Abdomen: Punctate calcification in the liver compatible with old granulomatous disease. Faint capsular calcification along the posterolateral spleen, likely postinflammatory. 1.2 by 1.8 cm mass of the lateral limb left adrenal gland on image 151 series 2, previously shown to have a density of 3 Hounsfield units on noncontrast CT from 07/19/2021 compatible with adrenal adenoma. Otherwise minimal fullness of both adrenal glands without a mass worrisome for metastatic lesion. Abdominal aortic atherosclerosis noted. Musculoskeletal: The large right upper lobe mass abuts the lateral pleural margin, without evidence of chest wall invasion or bony destructive findings in the adjacent ribs. Small Morgagni hernia containing adipose tissue on image 106 series 6. IMPRESSION: 1. 9.2 by 5.7 by 6.6 cm right upper lobe mass with lobulations appearing to extend across the minor fissure into the right middle lobe, and across the major fissure into the right lower lobe. Potential satellite lesion in the right upper lobe measures 1.6 by 1.4 by 1.4 cm. Appearance highly likely to reflect lung cancer. No findings of chest wall invasion or  metastatic disease to the upper abdomen. 2. Borderline prominent lower right paratracheal lymph node at 0.9 cm in short axis. 3. Old granulomatous disease. 4. Small left adrenal adenoma. 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 6. Aortic atherosclerosis. 7. Emphysema. 8. Noncalcified right lower lobe nodule 0.4 cm in diameter. Aortic Atherosclerosis (ICD10-I70.0) and Emphysema (ICD10-J43.9). Electronically Signed   By: Gaylyn Rong M.D.   On: 04/13/2022 15:02   DG Chest 2 View  Result Date: 04/10/2022 CLINICAL DATA:  Pulmonary disease, shortness of breath for 1 year, smoker, history prostate cancer EXAM: CHEST - 2 VIEW COMPARISON:  06/17/2016 FINDINGS: Normal heart size, mediastinal contours, and pulmonary vascularity. Atherosclerotic calcification aorta. Large area of opacity in mid RIGHT lung either representing pneumonia or mass/neoplasm. Central peribronchial thickening. Remaining lungs clear. No pleural effusion, pneumothorax, or acute osseous findings. IMPRESSION: Bronchitic changes with large area of opacity in the mid RIGHT lung either representing pneumonia or tumor; either serial radiographic follow-up until resolution or CT chest with contrast recommended to exclude pulmonary neoplasm. These results will be called to the ordering clinician or representative by the Radiologist Assistant, and communication documented in the PACS or [employer]. Electronically Signed   By: Ulyses Southward M.D.   On: 04/10/2022 16:22

## 2022-04-17 NOTE — Progress Notes (Deleted)
GI Office Note    Referring Provider: Benita Stabile, MD Primary Care Physician:  Benita Stabile, MD  Primary Gastroenterologist: ***  Chief Complaint   No chief complaint on file.  History of Present Illness   Carlos [name] is a 67 y.o. male presenting today at the request of Benita Stabile, MD for abdominal pain  Last colonoscopy in September 2017 by Dr. Karilyn Cota: -***   Today:    Current Outpatient Medications  Medication Sig Dispense Refill   busPIRone (BUSPAR) 5 MG tablet Take 1 tablet by mouth 2 (two) times daily.  0   clonazePAM (KLONOPIN) 1 MG disintegrating tablet Take by mouth.     meloxicam (MOBIC) 15 MG tablet Take 15 mg by mouth daily.     pantoprazole (PROTONIX) 40 MG tablet Take 40 mg by mouth daily.     sertraline (ZOLOFT) 50 MG tablet Take 50 mg by mouth daily.     simvastatin (ZOCOR) 10 MG tablet Take by mouth.     simvastatin (ZOCOR) 40 MG tablet TK 1 T PO QD  2   tamsulosin (FLOMAX) 0.4 MG CAPS capsule Take 1 capsule (0.4 mg total) by mouth daily. 30 capsule 11   No current facility-administered medications for this visit.    Past Medical History:  Diagnosis Date   Anxiety    Cancer    Skin cancer left shoulder   Chronic low back pain    Dermatofibrosarcoma protubera of right shoulder    Hyperlipidemia     Past Surgical History:  Procedure Laterality Date   APPENDECTOMY     APPLICATION OF A-CELL OF EXTREMITY Right 07/09/2012   Procedure: PLACEMENT OF A-CELL TO UPPER EXTREMITY/PLACEMENT OF VAC;  Surgeon: Wayland Denis, DO;  Location: [facility];  Service: Plastics;  Laterality: Right;   COLONOSCOPY WITH PROPOFOL N/A 09/30/2015   Procedure: COLONOSCOPY WITH PROPOFOL;  Surgeon: Malissa Hippo, MD;  [facility];  Service: Endoscopy;  Laterality: N/A;  830   EXCISION DERMATOFIBROCARCOMA PROTUBERAN RIGHT SHOULDER  2 - 3 WKS AGO   INGUINAL HERNIA REPAIR Left 1999   KNEE ARTHROSCOPY Left 2012   POLYPECTOMY  09/30/2015    Procedure: POLYPECTOMY;  Surgeon: Malissa Hippo, MD;  [facility];  Service: Endoscopy;;  sigmoid    Family History  Problem Relation Age of Onset   Dementia Mother    Cancer Father     Allergies as of 04/19/2022 - Review Complete 04/12/2022  Allergen Reaction Noted   Ciprofloxacin Swelling and Other (See Comments) 09/23/2015   Gabapentin Other (See Comments) 08/08/2016    Social History   Socioeconomic History   Marital status: Divorced    Spouse name: Carlos [name]   Number of children: 1   Years of education: 10th   Highest education level: Not on file  Occupational History    Employer: [employer]    Comment: [employer]  Tobacco Use   Smoking status: Every Day    Packs/day: 1.00    Years: 40.00    Additional pack years: 0.00    Total pack years: 40.00    Types: Cigarettes   Smokeless tobacco: Never  Vaping Use   Vaping Use: Never used  Substance and Sexual Activity   Alcohol use: No   Drug use: No   Sexual activity: Not on file  Other Topics Concern   Not on file  Social History Narrative   Patient lives at home with his  wife Carlos [name](Wanda).   Patient has one child.   Patient is working full-time but not right now because of his back.   Patient has 10 grade education.   Patient is right-handed.   Caffeine Use: 1 cup of coffee; 3-4 sodas daily   Social Determinants of Health   Financial Resource Strain: Not on file  Food Insecurity: Not on file  Transportation Needs: Not on file  Physical Activity: Not on file  Stress: Not on file  Social Connections: Not on file  Intimate Partner Violence: Not on file     Review of Systems   Gen: Denies any fever, chills, fatigue, weight loss, lack of appetite.  CV: Denies chest pain, heart palpitations, peripheral edema, syncope.  Resp: Denies shortness of breath at rest or with exertion. Denies wheezing or cough.  GI: see HPI GU : Denies urinary burning, urinary frequency, urinary  hesitancy MS: Denies joint pain, muscle weakness, cramps, or limitation of movement.  Derm: Denies rash, itching, dry skin Psych: Denies depression, anxiety, memory loss, and confusion Heme: Denies bruising, bleeding, and enlarged lymph nodes.   Physical Exam   There were no vitals taken for this visit.  General:   Alert and oriented. Pleasant and cooperative. Well-nourished and well-developed.  Head:  Normocephalic and atraumatic. Eyes:  Without icterus, sclera clear and conjunctiva pink.  Ears:  Normal auditory acuity. Mouth:  No deformity or lesions, oral mucosa pink.  Lungs:  Clear to auscultation bilaterally. No wheezes, rales, or rhonchi. No distress.  Heart:  S1, S2 present without murmurs appreciated.  Abdomen:  +BS, soft, non-tender and non-distended. No HSM noted. No guarding or rebound. No masses appreciated.  Rectal:  Deferred  Msk:  Symmetrical without gross deformities. Normal posture. Extremities:  Without edema. Neurologic:  Alert and  oriented x4;  grossly normal neurologically. Skin:  Intact without significant lesions or rashes. Psych:  Alert and cooperative. Normal mood and affect.   Assessment   Carlos [name] is a 67 y.o. male with a history of *** presenting today with      PLAN   ***    Brooke Bonitoourtney Daniela Siebers, MSN, FNP-BC, AGACNP-BC [facility]

## 2022-04-18 ENCOUNTER — Encounter: Payer: Self-pay | Admitting: Hematology

## 2022-04-18 ENCOUNTER — Encounter: Payer: Self-pay | Admitting: Emergency Medicine

## 2022-04-18 ENCOUNTER — Inpatient Hospital Stay: Payer: Medicare Other | Attending: Hematology | Admitting: Hematology

## 2022-04-18 ENCOUNTER — Telehealth: Payer: Self-pay | Admitting: Pulmonary Disease

## 2022-04-18 VITALS — BP 170/90 | HR 97 | Temp 97.6°F | Resp 16 | Ht 66.0 in | Wt 184.8 lb

## 2022-04-18 DIAGNOSIS — Z85828 Personal history of other malignant neoplasm of skin: Secondary | ICD-10-CM | POA: Diagnosis not present

## 2022-04-18 DIAGNOSIS — F17210 Nicotine dependence, cigarettes, uncomplicated: Secondary | ICD-10-CM | POA: Diagnosis not present

## 2022-04-18 DIAGNOSIS — R918 Other nonspecific abnormal finding of lung field: Secondary | ICD-10-CM | POA: Insufficient documentation

## 2022-04-18 DIAGNOSIS — C61 Malignant neoplasm of prostate: Secondary | ICD-10-CM | POA: Diagnosis not present

## 2022-04-18 NOTE — Progress Notes (Deleted)
GI Office Note    Referring Provider: Benita Stabile, MD Primary Care Physician:  Benita Stabile, MD  Primary Gastroenterologist: ***, previously Dr. Karilyn Cota  Chief Complaint   No chief complaint on file.  History of Present Illness   Carlos [name] is a 67 y.o. male presenting today at the request of Benita Stabile, MD for ***mid abdominal pain and screening colonoscopy.   Colonoscopy September 2017: -8mm sigmoid polyp (tubular adenoma) -external hemorrhoids -repeat in 7 years  Today:     Current Outpatient Medications  Medication Sig Dispense Refill   acetaminophen (TYLENOL) 500 MG tablet Take 1,000 mg by mouth every 6 (six) hours as needed (pain.).     aspirin EC 81 MG tablet Take 81 mg by mouth at bedtime. Swallow whole.     clonazePAM (KLONOPIN) 1 MG disintegrating tablet Take 1 mg by mouth in the morning and at bedtime.     meloxicam (MOBIC) 15 MG tablet Take 15 mg by mouth in the morning.     pantoprazole (PROTONIX) 40 MG tablet Take 40 mg by mouth daily.     simvastatin (ZOCOR) 20 MG tablet Take 20 mg by mouth at bedtime.  2   tamsulosin (FLOMAX) 0.4 MG CAPS capsule Take 1 capsule (0.4 mg total) by mouth daily. (Patient taking differently: Take 0.4 mg by mouth at bedtime.) 30 capsule 11   No current facility-administered medications for this visit.    Past Medical History:  Diagnosis Date   Anxiety    Cancer    Skin cancer left shoulder   Chronic low back pain    Dermatofibrosarcoma protubera of right shoulder    Hyperlipidemia     Past Surgical History:  Procedure Laterality Date   APPENDECTOMY     APPLICATION OF A-CELL OF EXTREMITY Right 07/09/2012   Procedure: PLACEMENT OF A-CELL TO UPPER EXTREMITY/PLACEMENT OF VAC;  Surgeon: Wayland Denis, DO;  Location: [facility];  Service: Plastics;  Laterality: Right;   COLONOSCOPY WITH PROPOFOL N/A 09/30/2015   Procedure: COLONOSCOPY WITH PROPOFOL;  Surgeon: Malissa Hippo, MD;  [facility];  Service: Endoscopy;  Laterality: N/A;  830   EXCISION DERMATOFIBROCARCOMA PROTUBERAN RIGHT SHOULDER  2 - 3 WKS AGO   INGUINAL HERNIA REPAIR Left 1999   KNEE ARTHROSCOPY Left 2012   POLYPECTOMY  09/30/2015   Procedure: POLYPECTOMY;  Surgeon: Malissa Hippo, MD;  Location: [facility];  Service: Endoscopy;;  sigmoid    Family History  Problem Relation Age of Onset   Dementia Mother    Cancer Father     Allergies as of 04/19/2022 - Review Complete 04/18/2022  Allergen Reaction Noted   Ciprofloxacin Swelling and Other (See Comments) 09/23/2015   Gabapentin Other (See Comments) 08/08/2016    Social History   Socioeconomic History   Marital status: Divorced    Spouse name: Burna Mortimer   Number of children: 1   Years of education: 10th   Highest education level: Not on file  Occupational History    Employer: [employer]    Comment: [employer]  Tobacco Use   Smoking status: Every Day    Packs/day: 1.00    Years: 40.00    Additional pack years: 0.00    Total pack years: 40.00    Types: Cigarettes   Smokeless tobacco: Never  Vaping Use   Vaping Use: Never used  Substance and Sexual Activity   Alcohol use: No   Drug use:  No   Sexual activity: Not on file  Other Topics Concern   Not on file  Social History Narrative   Patient lives at home with his wife Burna Mortimer).   Patient has one child.   Patient is working full-time but not right now because of his back.   Patient has 10 grade education.   Patient is right-handed.   Caffeine Use: 1 cup of coffee; 3-4 sodas daily   Social Determinants of Health   Financial Resource Strain: Not on file  Food Insecurity: No Food Insecurity (04/18/2022)   Hunger Vital Sign    Worried About Running Out of Food in the Last Year: Never true    Ran Out of Food in the Last Year: Never true  Transportation Needs: No Transportation Needs (04/18/2022)   PRAPARE - Administrator, Civil Service (Medical): No     Lack of Transportation (Non-Medical): No  Physical Activity: Not on file  Stress: Not on file  Social Connections: Not on file  Intimate Partner Violence: Not At Risk (04/18/2022)   Humiliation, Afraid, Rape, and Kick questionnaire    Fear of Current or Ex-Partner: No    Emotionally Abused: No    Physically Abused: No    Sexually Abused: No     Review of Systems   Gen: Denies any fever, chills, fatigue, weight loss, lack of appetite.  CV: Denies chest pain, heart palpitations, peripheral edema, syncope.  Resp: Denies shortness of breath at rest or with exertion. Denies wheezing or cough.  GI: see HPI GU : Denies urinary burning, urinary frequency, urinary hesitancy MS: Denies joint pain, muscle weakness, cramps, or limitation of movement.  Derm: Denies rash, itching, dry skin Psych: Denies depression, anxiety, memory loss, and confusion Heme: Denies bruising, bleeding, and enlarged lymph nodes.   Physical Exam   There were no vitals taken for this visit.  General:   Alert and oriented. Pleasant and cooperative. Well-nourished and well-developed.  Head:  Normocephalic and atraumatic. Eyes:  Without icterus, sclera clear and conjunctiva pink.  Ears:  Normal auditory acuity. Mouth:  No deformity or lesions, oral mucosa pink.  Lungs:  Clear to auscultation bilaterally. No wheezes, rales, or rhonchi. No distress.  Heart:  S1, S2 present without murmurs appreciated.  Abdomen:  +BS, soft, non-tender and non-distended. No HSM noted. No guarding or rebound. No masses appreciated. *** Rectal:  Deferred  Msk:  Symmetrical without gross deformities. Normal posture. Extremities:  Without edema. Neurologic:  Alert and  oriented x4;  grossly normal neurologically. Skin:  Intact without significant lesions or rashes. Psych:  Alert and cooperative. Normal mood and affect.   Assessment   Carlos [name] is a 68 y.o. male with a history of *** presenting today with   History of colon  polyps:   Abdominal pain:    PLAN   ***    Brooke Bonito, MSN, FNP-BC, AGACNP-BC [facility]

## 2022-04-18 NOTE — Telephone Encounter (Signed)
Pt has been scheduled.  Appt info in referral.  I spoke to him and gave appt info to sister as he requested.

## 2022-04-18 NOTE — Patient Instructions (Addendum)
[facility] Discharge Instructions   You were seen and examined today by Dr. Ellin Saba. He is a Systems developer (cancer doctor) who is seeing you today on behalf of Dr. Margo Aye for your recent diagnosis of a mass in your right lung.   We will arrange for you to have further testing to determine if this is cancer, what type of cancer this is, and what would be the best course of treatment for this presumed cancer. We will get a PET scan to see if there is cancer elsewhere in your body. We will need to get a biopsy to confirm the diagnosis of lung cancer and determine what type it is.    Thank you for choosing [facility] at [facility] to provide your oncology and hematology care.  To afford each patient quality time with our provider, please arrive at least 15 minutes before your scheduled appointment time.   If you have a lab appointment with the Cancer Center please come in thru the Main Entrance and check in at the main information desk.  You need to re-schedule your appointment should you arrive 10 or more minutes late.  We strive to give you quality time with our providers, and arriving late affects you and other patients whose appointments are after yours.  Also, if you no show three or more times for appointments you may be dismissed from the clinic at the providers discretion.     Again, thank you for choosing [facility].  Our hope is that these requests will decrease the amount of time that you wait before being seen by our physicians.       _____________________________________________________________  Should you have questions after your visit to [facility], please contact our office at [phone] and follow the prompts.  Our office hours are 8:00 a.m. and 4:30 p.m. Monday - Friday.  Please note that voicemails left after 4:00 p.m. may not be returned until the following business day.  We are  closed weekends and major holidays.  You do have access to a nurse 24-7, just call the main number to the clinic [phone] and do not press any options, hold on the line and a nurse will answer the phone.    For prescription refill requests, have your pharmacy contact our office and allow 72 hours.    Due to Covid, you will need to wear a mask upon entering the hospital. If you do not have a mask, a mask will be given to you at the Main Entrance upon arrival. For doctor visits, patients may have 1 support person age 2 or older with them. For treatment visits, patients can not have anyone with them due to social distancing guidelines and our immunocompromised population.

## 2022-04-18 NOTE — Telephone Encounter (Signed)
Thank you :)

## 2022-04-18 NOTE — Addendum Note (Signed)
Addended by: Dorisann Frames R on: 04/18/2022 01:38 PM   Modules accepted: Orders

## 2022-04-18 NOTE — Telephone Encounter (Signed)
Order placed for Covid test. Carlos [name] can you call pt and go over letter with pt?

## 2022-04-18 NOTE — Telephone Encounter (Signed)
PCCM:  I called spoke with the patient as well as the patient's sister about setting up bronchoscopy.  We will expedite the patient to get them scheduled on Monday with Dr. Delton Coombes.  Patient is not on any blood thinners or antiplatelets.  Carlos Igo, DO [facility] 04/18/2022 12:13 PM

## 2022-04-18 NOTE — Progress Notes (Unsigned)
Roanna Banning, MD  Leodis Rains D PROCEDURE / BIOPSY REVIEW Date: 04/18/22  Requested Biopsy site: R lung Reason for request: Mass Imaging review: Best seen on recent CT chest. Large 9 cm mass  Decision: Declined / Defer  Additional comments: Recommend bronchoscopic Bx. VIR re-referral if inadequate sampling, or otherwise.  Please contact me with questions, concerns, or if issue pertaining to this request arise.  Roanna Banning, MD [facility]

## 2022-04-18 NOTE — Progress Notes (Unsigned)
Roanna Banning, MD  Cheral Marker, RN [place],  I reached out to Dr Kirtland Bouchard by phone and addressed this. Added Dr Kirtland Bouchard to a secure chat with Interventional Pulm (Dr Tonia Brooms) to convey his expedited request.  Thanks, Edmonia Caprio, MD

## 2022-04-19 ENCOUNTER — Ambulatory Visit (HOSPITAL_COMMUNITY)
Admission: RE | Admit: 2022-04-19 | Discharge: 2022-04-19 | Disposition: A | Discharge status: Home / Self Care | Payer: Medicare Other | Source: Ambulatory Visit | Attending: Hematology | Admitting: Hematology

## 2022-04-19 ENCOUNTER — Ambulatory Visit: Payer: Medicare Other | Admitting: Gastroenterology

## 2022-04-19 ENCOUNTER — Encounter: Payer: Self-pay | Admitting: Internal Medicine

## 2022-04-19 ENCOUNTER — Encounter: Payer: Self-pay | Admitting: Gastroenterology

## 2022-04-19 ENCOUNTER — Other Ambulatory Visit: Payer: Medicare Other

## 2022-04-19 ENCOUNTER — Encounter (HOSPITAL_COMMUNITY)
Admission: RE | Admit: 2022-04-19 | Discharge: 2022-04-19 | Disposition: A | Discharge status: Home / Self Care | Payer: Medicare Other | Source: Ambulatory Visit | Attending: Hematology | Admitting: Hematology

## 2022-04-19 DIAGNOSIS — R918 Other nonspecific abnormal finding of lung field: Secondary | ICD-10-CM

## 2022-04-19 DIAGNOSIS — G319 Degenerative disease of nervous system, unspecified: Secondary | ICD-10-CM | POA: Diagnosis not present

## 2022-04-19 MED ORDER — GADOPICLENOL 0.5 MMOL/ML IV SOLN
8.0000 mL | Freq: Once | INTRAVENOUS | Status: AC | PRN
  Administered 2022-04-19: 8 mL via INTRAVENOUS

## 2022-04-19 MED ORDER — FLUDEOXYGLUCOSE F - 18 (FDG) INJECTION
9.8100 | Freq: Once | INTRAVENOUS | Status: AC | PRN
  Administered 2022-04-19: 9.81 via INTRAVENOUS

## 2022-04-20 ENCOUNTER — Encounter (HOSPITAL_COMMUNITY): Payer: Self-pay | Admitting: Emergency Medicine

## 2022-04-20 LAB — NOVEL CORONAVIRUS, NAA: SARS-CoV-2, NAA: NOT DETECTED

## 2022-04-20 LAB — SPECIMEN STATUS REPORT

## 2022-04-20 NOTE — Progress Notes (Signed)
SDW call  Patient was given pre-op instructions over the phone. Patient verbalized understanding of instructions provided.     PCP - Dr. Shellia Cleverly Cardiologist - Denies Pulmonary:    PPM/ICD - Denies   Chest x-ray - 04/10/2022 EKG -  n/a Stress Test - ECHO -  Cardiac Cath -   Sleep Study/sleep apnea/CPAP: Denies  Non-diabetic  Blood Thinner Instructions: Denies Aspirin Instructions: Hold ASA, last dose 04/20/2022   ERAS Protcol - No, NPO PRE-SURGERY Ensure or G2- NO   COVID TEST- 04/19/2022 [facility]     Anesthesia review: No   Patient denies shortness of breath, fever, cough and chest pain over the phone call    Your procedure is scheduled on Monday April 20, 2022  Report to [facility] Main Entrance "A" at  0545  A.M., then check in with the Admitting office.  Call this number if you have problems the morning of surgery:  [phone]   If you have any questions prior to your surgery date call [phone]: Open Monday-Friday 8am-4pm If you experience any cold or flu symptoms such as cough, fever, chills, shortness of breath, etc. between now and your scheduled surgery, please notify us at the above number     Remember:  Do not eat or drink after midnight the night before your surgery  Take these medicines the morning of surgery with A SIP OF WATER:  Clonazepam, protonix  As needed: tylenol  As of today, STOP taking any Aspirin (unless otherwise instructed by your surgeon) Aleve, Naproxen, Ibuprofen, Motrin, Advil, Goody's, BC's, all herbal medications, fish oil, and all vitamins. This includes your Mobic

## 2022-04-23 ENCOUNTER — Ambulatory Visit (HOSPITAL_COMMUNITY): Payer: Medicare Other | Admitting: Registered Nurse

## 2022-04-23 ENCOUNTER — Ambulatory Visit (HOSPITAL_COMMUNITY)
Admission: RE | Admit: 2022-04-23 | Discharge: 2022-04-23 | Disposition: A | Discharge status: Home / Self Care | Payer: Medicare Other | Attending: Emergency Medicine | Admitting: Emergency Medicine

## 2022-04-23 ENCOUNTER — Encounter (HOSPITAL_COMMUNITY)
Admission: RE | Disposition: A | Discharge status: Home / Self Care | Payer: Self-pay | Source: Home / Self Care | Attending: Emergency Medicine

## 2022-04-23 ENCOUNTER — Ambulatory Visit (HOSPITAL_BASED_OUTPATIENT_CLINIC_OR_DEPARTMENT_OTHER): Payer: Medicare Other | Admitting: Registered Nurse

## 2022-04-23 ENCOUNTER — Ambulatory Visit (HOSPITAL_COMMUNITY): Payer: Medicare Other

## 2022-04-23 DIAGNOSIS — C3411 Malignant neoplasm of upper lobe, right bronchus or lung: Secondary | ICD-10-CM

## 2022-04-23 DIAGNOSIS — C61 Malignant neoplasm of prostate: Secondary | ICD-10-CM | POA: Insufficient documentation

## 2022-04-23 DIAGNOSIS — F17210 Nicotine dependence, cigarettes, uncomplicated: Secondary | ICD-10-CM | POA: Diagnosis not present

## 2022-04-23 DIAGNOSIS — K219 Gastro-esophageal reflux disease without esophagitis: Secondary | ICD-10-CM | POA: Diagnosis not present

## 2022-04-23 DIAGNOSIS — R590 Localized enlarged lymph nodes: Secondary | ICD-10-CM | POA: Diagnosis not present

## 2022-04-23 DIAGNOSIS — F419 Anxiety disorder, unspecified: Secondary | ICD-10-CM | POA: Insufficient documentation

## 2022-04-23 DIAGNOSIS — C3490 Malignant neoplasm of unspecified part of unspecified bronchus or lung: Secondary | ICD-10-CM

## 2022-04-23 DIAGNOSIS — Z87891 Personal history of nicotine dependence: Secondary | ICD-10-CM | POA: Diagnosis not present

## 2022-04-23 DIAGNOSIS — N138 Other obstructive and reflux uropathy: Secondary | ICD-10-CM

## 2022-04-23 DIAGNOSIS — J449 Chronic obstructive pulmonary disease, unspecified: Secondary | ICD-10-CM | POA: Diagnosis not present

## 2022-04-23 DIAGNOSIS — R918 Other nonspecific abnormal finding of lung field: Secondary | ICD-10-CM | POA: Diagnosis present

## 2022-04-23 DIAGNOSIS — R846 Abnormal cytological findings in specimens from respiratory organs and thorax: Secondary | ICD-10-CM | POA: Diagnosis not present

## 2022-04-23 DIAGNOSIS — R911 Solitary pulmonary nodule: Secondary | ICD-10-CM | POA: Diagnosis not present

## 2022-04-23 HISTORY — DX: Other specified postprocedural states: Z98.890

## 2022-04-23 HISTORY — PX: VIDEO BRONCHOSCOPY WITH ENDOBRONCHIAL ULTRASOUND: SHX6177

## 2022-04-23 HISTORY — PX: BRONCHIAL BRUSHINGS: SHX5108

## 2022-04-23 HISTORY — PX: HEMOSTASIS CONTROL: SHX6838

## 2022-04-23 HISTORY — PX: BRONCHIAL NEEDLE ASPIRATION BIOPSY: SHX5106

## 2022-04-23 HISTORY — DX: Other complications of anesthesia, initial encounter: T88.59XA

## 2022-04-23 HISTORY — DX: Dyspnea, unspecified: R06.00

## 2022-04-23 HISTORY — DX: Malignant neoplasm of unspecified part of unspecified bronchus or lung: C34.90

## 2022-04-23 HISTORY — DX: Gastro-esophageal reflux disease without esophagitis: K21.9

## 2022-04-23 HISTORY — DX: Other specified postprocedural states: R11.2

## 2022-04-23 HISTORY — PX: VIDEO BRONCHOSCOPY WITH RADIAL ENDOBRONCHIAL ULTRASOUND: SHX6849

## 2022-04-23 LAB — CBC
HCT: 41 % (ref 39.0–52.0)
Hemoglobin: 14.3 g/dL (ref 13.0–17.0)
MCH: 31.5 pg (ref 26.0–34.0)
MCHC: 34.9 g/dL (ref 30.0–36.0)
MCV: 90.3 fL (ref 80.0–100.0)
Platelets: 337 10*3/uL (ref 150–400)
RBC: 4.54 MIL/uL (ref 4.22–5.81)
RDW: 14.3 % (ref 11.5–15.5)
WBC: 9 10*3/uL (ref 4.0–10.5)
nRBC: 0 % (ref 0.0–0.2)

## 2022-04-23 SURGERY — BRONCHOSCOPY, WITH EBUS
Anesthesia: General | Laterality: Right

## 2022-04-23 MED ORDER — OXYCODONE HCL 5 MG/5ML PO SOLN: 5.0000 mg | Freq: Once | ORAL | Status: DC | PRN

## 2022-04-23 MED ORDER — LIDOCAINE 2% (20 MG/ML) 5 ML SYRINGE
INTRAMUSCULAR | Status: DC | PRN
  Administered 2022-04-23: 80 mg via INTRAVENOUS

## 2022-04-23 MED ORDER — CHLORHEXIDINE GLUCONATE 0.12 % MT SOLN
OROMUCOSAL | Status: AC
  Filled 2022-04-23: qty 15

## 2022-04-23 MED ORDER — AMISULPRIDE (ANTIEMETIC) 5 MG/2ML IV SOLN: 10.0000 mg | Freq: Once | INTRAVENOUS | Status: DC | PRN

## 2022-04-23 MED ORDER — SUGAMMADEX SODIUM 200 MG/2ML IV SOLN
INTRAVENOUS | Status: DC | PRN
  Administered 2022-04-23: 200 mg via INTRAVENOUS

## 2022-04-23 MED ORDER — ALBUTEROL SULFATE (2.5 MG/3ML) 0.083% IN NEBU
INHALATION_SOLUTION | RESPIRATORY_TRACT | Status: AC
  Filled 2022-04-23: qty 3

## 2022-04-23 MED ORDER — PROMETHAZINE HCL 25 MG/ML IJ SOLN: 6.2500 mg | INTRAMUSCULAR | Status: DC | PRN

## 2022-04-23 MED ORDER — MEPERIDINE HCL 25 MG/ML IJ SOLN: 6.2500 mg | INTRAMUSCULAR | Status: DC | PRN

## 2022-04-23 MED ORDER — ROCURONIUM BROMIDE 10 MG/ML (PF) SYRINGE
PREFILLED_SYRINGE | INTRAVENOUS | Status: DC | PRN
  Administered 2022-04-23: 80 mg via INTRAVENOUS
  Administered 2022-04-23: 10 mg via INTRAVENOUS

## 2022-04-23 MED ORDER — ONDANSETRON HCL 4 MG/2ML IJ SOLN
INTRAMUSCULAR | Status: DC | PRN
  Administered 2022-04-23: 4 mg via INTRAVENOUS

## 2022-04-23 MED ORDER — ALBUTEROL SULFATE (2.5 MG/3ML) 0.083% IN NEBU
2.5000 mg | INHALATION_SOLUTION | Freq: Once | RESPIRATORY_TRACT | Status: AC
  Administered 2022-04-23: 2.5 mg via RESPIRATORY_TRACT

## 2022-04-23 MED ORDER — LACTATED RINGERS IV SOLN: INTRAVENOUS | Status: DC

## 2022-04-23 MED ORDER — OXYCODONE HCL 5 MG PO TABS: 5.0000 mg | ORAL_TABLET | Freq: Once | ORAL | Status: DC | PRN

## 2022-04-23 MED ORDER — PROPOFOL 10 MG/ML IV BOLUS
INTRAVENOUS | Status: DC | PRN
  Administered 2022-04-23: 200 mg via INTRAVENOUS
  Administered 2022-04-23: 100 ug/kg/min via INTRAVENOUS

## 2022-04-23 MED ORDER — FENTANYL CITRATE (PF) 250 MCG/5ML IJ SOLN
INTRAMUSCULAR | Status: DC | PRN
  Administered 2022-04-23: 100 ug via INTRAVENOUS

## 2022-04-23 MED ORDER — HYDROMORPHONE HCL 1 MG/ML IJ SOLN: 0.2500 mg | INTRAMUSCULAR | Status: DC | PRN

## 2022-04-23 NOTE — Op Note (Signed)
Video Bronchoscopy with Robotic Assisted Bronchoscopic Navigation and Endobronchial Ultrasound Procedure Note  Date of Operation: 04/23/2022   Pre-op Diagnosis: Right upper lobe mass, right upper lobe nodule, mediastinal adenopathy  Post-op Diagnosis: Same  Surgeon: Levy Pupa  Assistants: None  Anesthesia: General endotracheal anesthesia  Operation: Flexible video fiberoptic bronchoscopy with robotic assistance and biopsies.  Estimated Blood Loss: Minimal  Complications: None  Indications and History: Carlos [name] is a 67 y.o. male with history of tobacco use.  He was found to have right upper lobe mass with a suspected right upper lobe satellite nodule, mediastinal adenopathy on CT scan of the chest.  Recommendation made to achieve a tissue diagnosis via robotic assisted navigational bronchoscopy, endobronchial ultrasound and biopsies. The risks, benefits, complications, treatment options and expected outcomes were discussed with the patient.  The possibilities of pneumothorax, pneumonia, reaction to medication, pulmonary aspiration, perforation of a viscus, bleeding, failure to diagnose a condition and creating a complication requiring transfusion or operation were discussed with the patient who freely signed the consent.    Description of Procedure: The patient was seen in the Preoperative Area, was examined and was deemed appropriate to proceed.  The patient was taken to [facility] endoscopy room 3, identified as Carlos [name] and the procedure verified as Flexible Video Fiberoptic Bronchoscopy.  A Time Out was held and the above information confirmed.   Prior to the date of the procedure a high-resolution CT scan of the chest was performed. Utilizing ION software program a virtual tracheobronchial tree was generated to allow the creation of distinct navigation pathways to the patient's parenchymal abnormalities. After being taken to the operating room general anesthesia was initiated and  the patient  was orally intubated. The video fiberoptic bronchoscope was introduced via the endotracheal tube and a general inspection was performed which showed normal right and left lung anatomy. Aspiration of the bilateral mainstems was completed to remove any remaining secretions. Robotic catheter inserted into patient's endotracheal tube.   Target #1 right upper lobe nodule: The distinct navigation pathways prepared prior to this procedure were then utilized to navigate to patient's lesion identified on CT scan. The robotic catheter was secured into place and the vision probe was withdrawn.  Lesion location was approximated using fluoroscopy and radial endobronchial ultrasound for peripheral targeting.  Local registration and targeting was performed using Cios three-dimensional imaging.  Under fluoroscopic guidance transbronchial needle brushings, transbronchial needle biopsies, and transbronchial forceps biopsies were performed to be sent for cytology and pathology.  Target #2 right upper lobe mass: The distinct navigation pathways prepared prior to this procedure were then utilized to navigate to hypermetabolic component of the patient's lesion identified on CT scan. The robotic catheter was secured into place and the vision probe was withdrawn.  Lesion location was approximated using fluoroscopy and radial endobronchial ultrasound for peripheral targeting. Under fluoroscopic guidance transbronchial needle brushings, transbronchial needle biopsies, and transbronchial forceps biopsies were performed to be sent for cytology and pathology.   The robotic scope was then withdrawn and the endobronchial ultrasound was used to identify and characterize the peritracheal, hilar and bronchial lymph nodes. Inspection showed enlarged nodes at station 4R. Using real-time ultrasound guidance Wang needle biopsies were take from Station 4R nodes and were sent for cytology.   At the end of the procedure a general  airway inspection was performed and there was no evidence of active bleeding. The bronchoscope was removed.  The patient tolerated the procedure well. There was no significant blood loss  and there were no obvious complications. A post-procedural chest x-ray is pending.  Samples Target #1: 1. Transbronchial needle brushings from right upper lobe nodule 2. Transbronchial Wang needle biopsies from right upper lobe nodule 3. Transbronchial forceps biopsies from right upper lobe nodule  Samples Target #2: 1. Transbronchial needle brushings from right upper lobe mass 2. Transbronchial Wang needle biopsies from right upper lobe mass 3. Transbronchial forceps biopsies from right upper lobe mass  EBUS Samples: 1. Wang needle biopsies from 4R node   Plans:  The patient will be discharged from the PACU to home when recovered from anesthesia and after chest x-ray is reviewed. We will review the cytology, pathology and microbiology results with the patient when they become available. Outpatient followup will be with Dr Ellin Saba.    Levy Pupa, MD, PhD 04/23/2022, 10:19 AM [facility] [phone] or if no answer before 7:00PM call [phone] For any issues after 7:00PM please call eLink [phone]

## 2022-04-23 NOTE — Anesthesia Preprocedure Evaluation (Signed)
Anesthesia Evaluation  Patient identified by MRN, date of birth, ID band Patient awake    Reviewed: Allergy & Precautions, H&P , NPO status , Patient's Chart, lab work & pertinent test results  History of Anesthesia Complications (+) PONV and history of anesthetic complications  Airway Mallampati: II  TM Distance: >3 FB Neck ROM: Full    Dental  (+) Partial Upper   Pulmonary shortness of breath, former smoker Smoking Status: Former Smoker - 40 pack years    Quit Smoking: 07/07/08     Pulmonary exam normal breath sounds clear to auscultation       Cardiovascular negative cardio ROS Normal cardiovascular exam Rhythm:Regular Rate:Normal     Neuro/Psych   Anxiety     negative neurological ROS     GI/Hepatic Neg liver ROS,GERD  ,,  Endo/Other  negative endocrine ROS    Renal/GU negative Renal ROS  negative genitourinary   Musculoskeletal negative musculoskeletal ROS (+)    Abdominal  (+) + obese  Peds negative pediatric ROS (+)  Hematology negative hematology ROS (+)   Anesthesia Other Findings   Reproductive/Obstetrics negative OB ROS                             Anesthesia Physical Anesthesia Plan  ASA: 3  Anesthesia Plan: General   Post-op Pain Management: Minimal or no pain anticipated   Induction: Intravenous  PONV Risk Score and Plan: 2 and Ondansetron, Midazolam and Treatment may vary due to age or medical condition  Airway Management Planned: Oral ETT  Additional Equipment:   Intra-op Plan:   Post-operative Plan: Extubation in OR  Informed Consent: I have reviewed the patients History and Physical, chart, labs and discussed the procedure including the risks, benefits and alternatives for the proposed anesthesia with the patient or authorized representative who has indicated his/her understanding and acceptance.       Plan Discussed with:   Anesthesia Plan  Comments:         Anesthesia Quick Evaluation

## 2022-04-23 NOTE — Transfer of Care (Signed)
Immediate Anesthesia Transfer of Care Note  Patient: Carlos [name]  Procedure(s) Performed: VIDEO BRONCHOSCOPY WITH ENDOBRONCHIAL ULTRASOUND (Bilateral) ROBOTIC ASSISTED NAVIGATIONAL BRONCHOSCOPY (Right) BRONCHIAL BRUSHINGS VIDEO BRONCHOSCOPY WITH RADIAL ENDOBRONCHIAL ULTRASOUND BRONCHIAL NEEDLE ASPIRATION BIOPSIES HEMOSTASIS CONTROL  Patient Location: PACU  Anesthesia Type:General  Level of Consciousness: awake, alert , and oriented  Airway & Oxygen Therapy: Patient Spontanous Breathing  Post-op Assessment: Report given to RN and Post -op Vital signs reviewed and stable  Post vital signs: Reviewed and stable  Last Vitals:  Vitals Value Taken Time  BP 137/72 04/23/22 1023  Temp    Pulse 109 04/23/22 1025  Resp 18 04/23/22 1025  SpO2 93 % 04/23/22 1025  Vitals shown include unvalidated device data.  Last Pain:  Vitals:   04/23/22 0632  TempSrc:   PainSc: 0-No pain         Complications: There were no known notable events for this encounter.

## 2022-04-23 NOTE — Anesthesia Postprocedure Evaluation (Signed)
Anesthesia Post Note  Patient: Carlos [name]  Procedure(s) Performed: VIDEO BRONCHOSCOPY WITH ENDOBRONCHIAL ULTRASOUND (Bilateral) ROBOTIC ASSISTED NAVIGATIONAL BRONCHOSCOPY (Right) BRONCHIAL BRUSHINGS VIDEO BRONCHOSCOPY WITH RADIAL ENDOBRONCHIAL ULTRASOUND BRONCHIAL NEEDLE ASPIRATION BIOPSIES HEMOSTASIS CONTROL     Patient location during evaluation: PACU Anesthesia Type: General Level of consciousness: awake and alert Pain management: pain level controlled Vital Signs Assessment: post-procedure vital signs reviewed and stable Respiratory status: spontaneous breathing, nonlabored ventilation and respiratory function stable Cardiovascular status: blood pressure returned to baseline and stable Postop Assessment: no apparent nausea or vomiting Anesthetic complications: no   There were no known notable events for this encounter.  Last Vitals:  Vitals:   04/23/22 1045 04/23/22 1100  BP: (!) 157/78 (!) 149/69  Pulse: (!) 109 (!) 110  Resp: 20 16  Temp: 36.9 C   SpO2: 91% 94%    Last Pain:  Vitals:   04/23/22 1045  TempSrc:   PainSc: 0-No pain                 Lowella Curb

## 2022-04-23 NOTE — H&P (Signed)
Carlos [name] is an 67 y.o. male.   Chief Complaint: Right upper lobe mass, right upper lobe nodule HPI: 67 year old gentleman with a history of tobacco use (50 pack years), COPD, new diagnosis of Gleason 6 prostate cancer 10/2021 on surveillance.Marland Kitchen  He was found to have a large right upper lobe mass that it impinges on the minor and major fissures, a satellite 1.6 cm right upper lobe nodule.  Presents now for tissue diagnosis. He reports that he is feeling well, has stable cough, stable exertional shortness of breath.   Past Medical History:  Diagnosis Date   Anxiety    Cancer    Skin cancer left shoulder   Chronic low back pain    Complication of anesthesia    Dermatofibrosarcoma protubera of right shoulder    Dyspnea    GERD (gastroesophageal reflux disease)    Hyperlipidemia    PONV (postoperative nausea and vomiting)     Past Surgical History:  Procedure Laterality Date   APPENDECTOMY     APPLICATION OF A-CELL OF EXTREMITY Right 07/09/2012   Procedure: PLACEMENT OF A-CELL TO UPPER EXTREMITY/PLACEMENT OF VAC;  Surgeon: Wayland Denis, DO;  Location: [facility];  Service: Plastics;  Laterality: Right;   COLONOSCOPY WITH PROPOFOL N/A 09/30/2015   Procedure: COLONOSCOPY WITH PROPOFOL;  Surgeon: Malissa Hippo, MD;  Location: [facility];  Service: Endoscopy;  Laterality: N/A;  830   EXCISION DERMATOFIBROCARCOMA PROTUBERAN RIGHT SHOULDER  2 - 3 WKS AGO   INGUINAL HERNIA REPAIR Left 1999   KNEE ARTHROSCOPY Left 2012   POLYPECTOMY  09/30/2015   Procedure: POLYPECTOMY;  Surgeon: Malissa Hippo, MD;  Location: [facility];  Service: Endoscopy;;  sigmoid    Family History  Problem Relation Age of Onset   Dementia Mother    Cancer Father    Social History:  reports that he has been smoking cigarettes. He has a 60.00 pack-year smoking history. He has never used smokeless tobacco. He reports that he does not currently use alcohol. He reports that he does not use  drugs.  Allergies:  Allergies  Allergen Reactions   Ciprofloxacin Swelling and Other (See Comments)    Swelling in hands, numbness in arms and joint pain   Gabapentin Other (See Comments)    Double vision    Medications Prior to Admission  Medication Sig Dispense Refill   acetaminophen (TYLENOL) 500 MG tablet Take 1,000 mg by mouth every 6 (six) hours as needed (pain.).     aspirin EC 81 MG tablet Take 81 mg by mouth at bedtime. Swallow whole.     clonazePAM (KLONOPIN) 1 MG disintegrating tablet Take 1 mg by mouth in the morning and at bedtime.     meloxicam (MOBIC) 15 MG tablet Take 15 mg by mouth in the morning.     pantoprazole (PROTONIX) 40 MG tablet Take 40 mg by mouth daily.     simvastatin (ZOCOR) 20 MG tablet Take 20 mg by mouth at bedtime.  2   tamsulosin (FLOMAX) 0.4 MG CAPS capsule Take 1 capsule (0.4 mg total) by mouth daily. (Patient taking differently: Take 0.4 mg by mouth at bedtime.) 30 capsule 11    Results for orders placed or performed during the hospital encounter of 04/23/22 (from the past 48 hour(s))  CBC per protocol     Status: None   Collection Time: 04/23/22  6:02 AM  Result Value Ref Range   WBC 9.0 4.0 - 10.5 K/uL   RBC  4.54 4.22 - 5.81 MIL/uL   Hemoglobin 14.3 13.0 - 17.0 g/dL   HCT 74.9 44.9 - 67.5 %   MCV 90.3 80.0 - 100.0 fL   MCH 31.5 26.0 - 34.0 pg   MCHC 34.9 30.0 - 36.0 g/dL   RDW 91.6 38.4 - 66.5 %   Platelets 337 150 - 400 K/uL   nRBC 0.0 0.0 - 0.2 %    Comment: Performed at [facility], [address] [place], [place] [address]   No results found.  Review of Systems  Blood pressure (!) 149/75, pulse 85, temperature 98.6 F (37 C), temperature source Oral, resp. rate 18, height 5\' 3"  (1.6 m), weight 83.5 kg, SpO2 96 %. Physical Exam  Gen: Pleasant, overweight gentleman, in no distress,  normal affect  ENT: No lesions,  mouth clear,  oropharynx clear, no postnasal drip  Neck: No JVD, no stridor  Lungs: No use of accessory  muscles, somewhat distant, no crackles or wheezing on normal respiration, no wheeze on forced expiration  Cardiovascular: RRR, heart sounds normal, no murmur or gallops, no peripheral edema  Abdomen: soft and NT, no HSM,  BS normal  Musculoskeletal: No deformities, no cyanosis or clubbing  Neuro: alert, awake, non focal  Skin: Warm, no lesions or rashes    Assessment/Plan Right upper lobe mass, satellite right upper lobe nodule in a patient with heavy tobacco history, new diagnosis of prostate cancer.  Presumed primary lung cancer.  He needs a tissue diagnosis.  Planning for robotic navigational bronchoscopy today to approach the right upper lobe mass in the right upper lobe nodule if accessible.  He understands the risk, benefits, rationale and agrees to proceed.  No barriers identified.  Leslye Peer, MD 04/23/2022, 7:25 AM

## 2022-04-23 NOTE — Discharge Instructions (Signed)
Flexible Bronchoscopy, Care After This sheet gives you information about how to care for yourself after your test. Your doctor may also give you more specific instructions. If you have problems or questions, contact your doctor. Follow these instructions at home: Eating and drinking When your numbness is gone and your cough and gag reflexes have come back, you may: Eat only soft foods. Slowly drink liquids. The day after the test, go back to your normal diet. Driving Do not drive for 24 hours if you were given a medicine to help you relax (sedative). Do not drive or use heavy machinery while taking prescription pain medicine. General instructions  Take over-the-counter and prescription medicines only as told by your doctor. Return to your normal activities as told. Ask what activities are safe for you. Do not use any products that have nicotine or tobacco in them. This includes cigarettes and e-cigarettes. If you need help quitting, ask your doctor. Keep all follow-up visits as told by your doctor. This is important. It is very important if you had a tissue sample (biopsy) taken. Get help right away if: You have shortness of breath that gets worse. You get light-headed. You feel like you are going to pass out (faint). You have chest pain. You cough up: More than a little blood. More blood than before. Summary Do not eat or drink anything (not even water) for 2 hours after your test, or until your numbing medicine wears off. Do not use cigarettes. Do not use e-cigarettes. Get help right away if you have chest pain.  Please call our office for any questions or concerns.  [phone].  This information is not intended to replace advice given to you by your health care provider. Make sure you discuss any questions you have with your health care provider. Document Released: 10/22/2008 Document Revised: 12/07/2016 Document Reviewed: 01/13/2016 Elsevier Patient Education  2020 [employer]  

## 2022-04-23 NOTE — Anesthesia Procedure Notes (Signed)
Procedure Name: Intubation Date/Time: 04/23/2022 8:52 AM  Performed by: Sharyn Dross, CRNAPre-anesthesia Checklist: Patient identified, Emergency Drugs available, Suction available and Patient being monitored Patient Re-evaluated:Patient Re-evaluated prior to induction Oxygen Delivery Method: Circle system utilized Preoxygenation: Pre-oxygenation with 100% oxygen Induction Type: IV induction Ventilation: Mask ventilation without difficulty Laryngoscope Size: Mac and 4 Grade View: Grade II Tube type: Oral Tube size: 8.5 mm Number of attempts: 1 Airway Equipment and Method: Stylet and Oral airway Placement Confirmation: ETT inserted through vocal cords under direct vision, positive ETCO2 and breath sounds checked- equal and bilateral Secured at: 22 cm Tube secured with: Tape Dental Injury: Teeth and Oropharynx as per pre-operative assessment

## 2022-04-24 ENCOUNTER — Encounter (HOSPITAL_COMMUNITY): Payer: Self-pay | Admitting: Emergency Medicine

## 2022-04-25 ENCOUNTER — Telehealth: Payer: Self-pay | Admitting: Emergency Medicine

## 2022-04-25 LAB — CYTOLOGY - NON PAP

## 2022-04-25 NOTE — Telephone Encounter (Signed)
Reviewed the patient's bronchoscopy results with him by phone.  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  His 4R node was negative.  He has follow-up with Dr. Ellin Saba next week.

## 2022-04-26 ENCOUNTER — Other Ambulatory Visit (HOSPITAL_COMMUNITY): Payer: Medicare Other

## 2022-04-27 ENCOUNTER — Other Ambulatory Visit: Payer: Self-pay | Admitting: Student

## 2022-04-27 ENCOUNTER — Ambulatory Visit: Payer: Medicare Other

## 2022-04-27 DIAGNOSIS — R918 Other nonspecific abnormal finding of lung field: Secondary | ICD-10-CM

## 2022-04-27 NOTE — Progress Notes (Signed)
Patient for IR Port Insertion on Monday 04/30/2022, I called and spoke with the patient's daughter, Misty Stanley on the phone and gave pre-procedure instructions. Misty Stanley was made aware to have the pt here at 9a, NPO after MN prior to procedure as well as driver post procedure/recovery/discharge. Misty Stanley stated understanding.  Called 04/25/2022 and 04/27/2022

## 2022-04-28 NOTE — H&P (Signed)
Chief Complaint: Chemotherapy Access. Team is requesting portacath placement.   Referring Physician(s): Katragadda,Sreedhar  Supervising Physician: Malachy Moan  Patient Status: [facility] - Out-pt  History of Present Illness: Carlos [name] is a 67 y.o. male outpatient. History of HLD, GERD. 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 Team is requesting portacath placement for chemotherapy access.   Patient alert and laying in bed,calm. Sisters at bedside. Endorses neck pain that is baseline. Denies any fevers, headache, chest pain, SOB, cough, abdominal pain, nausea, vomiting or bleeding. Return precautions and treatment recommendations and follow-up discussed with the patient and sisters who are both agreeable with the plan.    Past Medical History:  Diagnosis Date   Anxiety    Cancer    Skin cancer left shoulder   Chronic low back pain    Complication of anesthesia    Dermatofibrosarcoma protubera of right shoulder    Dyspnea    GERD (gastroesophageal reflux disease)    Hyperlipidemia    PONV (postoperative nausea and vomiting)     Past Surgical History:  Procedure Laterality Date   APPENDECTOMY     APPLICATION OF A-CELL OF EXTREMITY Right 07/09/2012   Procedure: PLACEMENT OF A-CELL TO UPPER EXTREMITY/PLACEMENT OF VAC;  Surgeon: Wayland Denis, DO;  Location: [facility];  Service: Plastics;  Laterality: Right;   BRONCHIAL BRUSHINGS  04/23/2022   Procedure: BRONCHIAL BRUSHINGS;  Surgeon: Leslye Peer, MD;  Location: [facility] ENDOSCOPY;  Service: Pulmonary;;   BRONCHIAL NEEDLE ASPIRATION BIOPSY  04/23/2022   Procedure: BRONCHIAL NEEDLE ASPIRATION BIOPSIES;  Surgeon: Leslye Peer, MD;  Location: [facility] ENDOSCOPY;  Service: Pulmonary;;   COLONOSCOPY WITH PROPOFOL N/A 09/30/2015   Procedure: COLONOSCOPY WITH PROPOFOL;  Surgeon: Malissa Hippo, MD;  Location: [facility] ENDO [facility];  Service: Endoscopy;  Laterality: N/A;  830   EXCISION DERMATOFIBROCARCOMA  PROTUBERAN RIGHT SHOULDER  2 - 3 WKS AGO   HEMOSTASIS CONTROL  04/23/2022   Procedure: HEMOSTASIS CONTROL;  Surgeon: Leslye Peer, MD;  Location: [facility] ENDOSCOPY;  Service: Pulmonary;;   INGUINAL HERNIA REPAIR Left 1999   KNEE ARTHROSCOPY Left 2012   POLYPECTOMY  09/30/2015   Procedure: POLYPECTOMY;  Surgeon: Malissa Hippo, MD;  Location: [facility];  Service: Endoscopy;;  sigmoid   VIDEO BRONCHOSCOPY WITH ENDOBRONCHIAL ULTRASOUND Bilateral 04/23/2022   Procedure: VIDEO BRONCHOSCOPY WITH ENDOBRONCHIAL ULTRASOUND;  Surgeon: Leslye Peer, MD;  Location: [facility] ENDOSCOPY;  Service: Pulmonary;  Laterality: Bilateral;   VIDEO BRONCHOSCOPY WITH RADIAL ENDOBRONCHIAL ULTRASOUND  04/23/2022   Procedure: VIDEO BRONCHOSCOPY WITH RADIAL ENDOBRONCHIAL ULTRASOUND;  Surgeon: Leslye Peer, MD;  Location: [facility] ENDOSCOPY;  Service: Pulmonary;;    Allergies: Ciprofloxacin and Gabapentin  Medications: Prior to Admission medications   Medication Sig Start Date End Date Taking? Authorizing Provider  acetaminophen (TYLENOL) 500 MG tablet Take 1,000 mg by mouth every 6 (six) hours as needed (pain.).    [provider]  aspirin EC 81 MG tablet Take 81 mg by mouth at bedtime. Swallow whole.    [provider]  clonazePAM (KLONOPIN) 1 MG disintegrating tablet Take 1 mg by mouth in the morning and at bedtime.    [provider]  meloxicam (MOBIC) 15 MG tablet Take 15 mg by mouth in the morning. 09/12/20   [provider]  pantoprazole (PROTONIX) 40 MG tablet Take 40 mg by mouth daily. 09/12/20   [provider]  simvastatin (ZOCOR) 20 MG tablet Take 20 mg by mouth at bedtime. 04/25/16  [provider]  tamsulosin (FLOMAX) 0.4 MG CAPS capsule Take 1 capsule (0.4 mg total) by mouth daily. Patient taking differently: Take 0.4 mg by mouth at bedtime. 09/26/21   Stoneking, Danford Bad., MD     Family History  Problem Relation Age of Onset   Dementia Mother    Cancer  Father     Social History   Socioeconomic History   Marital status: Divorced    Spouse name: Burna Mortimer   Number of children: 1   Years of education: 10th   Highest education level: Not on file  Occupational History    Employer: [employer]    Comment: [employer]  Tobacco Use   Smoking status: Every Day    Packs/day: 1.00    Years: 40.00    Additional pack years: 0.00    Total pack years: 40.00    Types: Cigarettes   Smokeless tobacco: Never  Vaping Use   Vaping Use: Never used  Substance and Sexual Activity   Alcohol use: Yes    Comment: 2-3 cans beer/day   Drug use: No   Sexual activity: Not on file  Other Topics Concern   Not on file  Social History Narrative   Patient lives at home with his dog. 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 Patient has 10 grade education.Patient is right-handed.Caffeine Use: 1 cup of coffee; 3-4 sodas daily   Social Determinants of Health   Financial Resource Strain: Not on file  Food Insecurity: No Food Insecurity (04/18/2022)   Hunger Vital Sign    Worried About Running Out of Food in the Last Year: Never true    Ran Out of Food in the Last Year: Never true  Transportation Needs: No Transportation Needs (04/18/2022)   PRAPARE - Administrator, Civil Service (Medical): No    Lack of Transportation (Non-Medical): No  Physical Activity: Not on file  Stress: Not on file  Social Connections: Not on file    Review of Systems: A 12 point ROS discussed and pertinent positives are indicated in the HPI above.  All other systems are negative.  Review of Systems  Constitutional:  Negative for fever.  HENT:  Negative for congestion.   Respiratory:  Negative for cough and shortness of breath.   Cardiovascular:  Negative for chest pain.  Gastrointestinal:  Negative for abdominal pain.  Musculoskeletal:  Positive for arthralgias (neck pain baseline.).  Neurological:  Negative for  headaches.  Psychiatric/Behavioral:  Negative for behavioral problems and confusion.     Vital Signs: BP (!) 155/85   Pulse (!) 101   Temp 97.8 F (36.6 C) (Oral)   Resp 18   Ht 5\' 6"  (1.676 m)   Wt 179 lb 14.3 oz (81.6 kg)   SpO2 95%   BMI 29.04 kg/m    Physical Exam Vitals and nursing note reviewed.  Constitutional:      Appearance: He is well-developed.  HENT:     Head: Normocephalic.  Cardiovascular:     Rate and Rhythm: Normal rate and regular rhythm.     Heart sounds: Normal heart sounds.  Pulmonary:     Effort: Pulmonary effort is normal.     Breath sounds: Normal breath sounds.  Musculoskeletal:        General: Normal range of motion.     Cervical back: Normal range of motion.  Skin:    General: Skin is warm and dry.  Neurological:  General: No focal deficit present.     Mental Status: He is alert and oriented to person, place, and time.  Psychiatric:        Mood and Affect: Mood normal.        Behavior: Behavior normal.        Thought Content: Thought content normal.        Judgment: Judgment normal.     Imaging: CT Angio Chest PE W and/or Wo Contrast  Result Date: 04/29/2022 CLINICAL DATA:  Pulmonary embolism (PE) suspected, high prob. Hemoptysis, shortness of breath EXAM: CT ANGIOGRAPHY CHEST WITH CONTRAST TECHNIQUE: Multidetector CT imaging of the chest was performed using the standard protocol during bolus administration of intravenous contrast. Multiplanar CT image reconstructions and MIPs were obtained to evaluate the vascular anatomy. RADIATION DOSE REDUCTION: This exam was performed according to the departmental dose-optimization program which includes automated exposure control, adjustment of the mA and/or kV according to patient size and/or use of iterative reconstruction technique. CONTRAST:  75mL OMNIPAQUE IOHEXOL 350 MG/ML SOLN COMPARISON:  04/13/2022 FINDINGS: Cardiovascular: No filling defects in the pulmonary arteries to suggest pulmonary  emboli. Heart is normal size. Aorta is normal caliber. Scattered aortic calcifications. Mediastinum/Nodes: No mediastinal, hilar, or axillary adenopathy. Trachea and esophagus are unremarkable. Thyroid unremarkable. Lungs/Pleura: 9.1 cm right upper lobe mass is again noted as seen on prior study compatible with malignancy. Spiculated right upper lobe nodule measures 2.0 x 1.6 cm, similar to prior study, compatible with malignancy. No effusions. Left lung clear. Upper Abdomen: No acute findings Musculoskeletal: Chest wall soft tissues are unremarkable. No acute bony abnormality. Review of the MIP images confirms the above findings. IMPRESSION: No evidence of pulmonary embolus. Stable 9.1 cm right upper lobe mass hand spiculated 2 cm right upper lobe nodule. Findings compatible with malignancy. Aortic Atherosclerosis (ICD10-I70.0). Electronically Signed   By: Charlett Nose M.D.   On: 04/29/2022 23:27   DG Chest Port 1 View  Result Date: 04/23/2022 CLINICAL DATA:  S/P bronchoscopy with biopsy EXAM: PORTABLE CHEST 1 VIEW COMPARISON:  Chest x-ray April 10, 2022. FINDINGS: Mildly increased size of masslike consolidation in the right midlung. No visible pneumothorax on this semi erect radiograph. Left lung is clear. No visible pleural effusions. Cardiomediastinal silhouette is within normal limits. IMPRESSION: Mildly increased size of masslike consolidation in the right midlung. No visible pneumothorax on this semi erect radiograph. Electronically Signed   By: Feliberto Harts M.D.   On: 04/23/2022 11:19   DG C-ARM BRONCHOSCOPY  Result Date: 04/23/2022 C-ARM BRONCHOSCOPY: Fluoroscopy was utilized by the requesting physician.  No radiographic interpretation.   NM PET Image Initial (PI) Skull Base To Thigh  Result Date: 04/23/2022 CLINICAL DATA:  Initial treatment strategy for right lung mass. EXAM: NUCLEAR MEDICINE PET SKULL BASE TO THIGH TECHNIQUE: 9.8 mCi F-18 FDG was injected intravenously. Full-ring PET  imaging was performed from the skull base to thigh after the radiotracer. CT data was obtained and used for attenuation correction and anatomic localization. Fasting blood glucose: 95 mg/dl COMPARISON:  CT chest 16/10/9602 FINDINGS: Mediastinal blood pool activity: SUV max 2.8 Liver activity: SUV max NA NECK: No significant abnormal hypermetabolic activity in this region. Incidental CT findings: Chronic right maxillary sinusitis. Old right medial orbital wall fracture incidentally noted. CHEST: 9.0 by 5.7 cm mass centered in the right upper lobe has a maximum SUV of 11.8, compatible with malignancy. Satellite nodule measuring 1.3 by 0.9 cm in the right upper lobe on image 58 series 7 has maximum SUV  of 3.8, compatible with malignancy. There is evidence of old granulomatous disease. Small nodules under 5 mm in size are noted for example in the posterior basal segment left lower lobe, and are below sensitive PET-CT size thresholds. 0.7 cm right lower paratracheal lymph node on image 87 series 3 has maximum SUV of 2.8, equal to the blood pool. Minimal accentuated distal esophageal activity with maximum SUV of 3.9, probably physiologic. Incidental CT findings: Coronary, aortic arch, and branch vessel atherosclerotic vascular disease. ABDOMEN/PELVIS: No significant abnormal hypermetabolic activity in this region. Incidental CT findings: Diffuse hepatic steatosis. Atherosclerosis is present, including aortoiliac atherosclerotic disease. Old granulomatous disease involving the liver. Small benign left adrenal adenoma. Mild prostatomegaly. SKELETON: No significant abnormal hypermetabolic activity in this region. Incidental note made of activity along the left hand injection site. Incidental CT findings: No significant abnormal hypermetabolic activity in this region. IMPRESSION: 1. 9.0 by 5.7 cm right upper lobe mass has a maximum SUV of 11.8, compatible with malignancy. 2. 1.3 by 0.9 cm satellite nodule in the right upper  lobe has a maximum SUV of 3.8, compatible with malignancy. 3. No findings of hypermetabolic adenopathy or distant metastatic disease. 4. Small benign left adrenal adenoma. 5. Diffuse hepatic steatosis. 6. Chronic right maxillary sinusitis. 7. Mild prostatomegaly. 8. Aortic and coronary atherosclerosis. Aortic Atherosclerosis (ICD10-I70.0). Electronically Signed   By: Gaylyn Rong M.D.   On: 04/23/2022 09:19   MR Brain W Wo Contrast  Result Date: 04/20/2022 CLINICAL DATA:  Lung mass.  Evaluation for brain metastases. EXAM: MRI HEAD WITHOUT AND WITH CONTRAST TECHNIQUE: Multiplanar, multiecho pulse sequences of the brain and surrounding structures were obtained without and with intravenous contrast. CONTRAST:  8 mL Gadavist COMPARISON:  Head CT 06/17/2016 and MRI 04/26/2003 FINDINGS: Brain: There is no evidence of an acute infarct, intracranial hemorrhage, mass, midline shift, or extra-axial fluid collection. T2 hyperintensities in the cerebral white matter are new from the prior MRI and are nonspecific but compatible with mild chronic small vessel ischemic disease. Asymmetric iron deposition is noted in the right cerebral peduncle. Mild cerebral atrophy is within normal limits for age. No abnormal enhancement is identified. Vascular: Hypoplastic distal left vertebral artery. Other major intracranial vascular flow voids are preserved. Skull and upper cervical spine: Unremarkable bone marrow signal. Sinuses/Orbits: Bilateral cataract extraction. Remote medial right orbital fracture. Small mucous retention cyst in the right maxillary sinus. Trace bilateral mastoid fluid. Other: None. IMPRESSION: 1. No evidence of intracranial metastases. 2. Mild chronic small vessel ischemic disease. Electronically Signed   By: Sebastian Ache M.D.   On: 04/20/2022 17:37   CT CHEST W CONTRAST  Result Date: 04/13/2022 CLINICAL DATA:  Right lung mass.  History prostate cancer. * Tracking Code: BO * EXAM: CT CHEST WITH CONTRAST  TECHNIQUE: Multidetector CT imaging of the chest was performed during intravenous contrast administration. RADIATION DOSE REDUCTION: This exam was performed according to the departmental dose-optimization program which includes automated exposure control, adjustment of the mA and/or kV according to patient size and/or use of iterative reconstruction technique. CONTRAST:  75mL OMNIPAQUE IOHEXOL 300 MG/ML  SOLN COMPARISON:  Chest radiograph 04/10/2022 FINDINGS: Cardiovascular: Coronary, aortic arch, and branch vessel atherosclerotic vascular disease. Mediastinum/Nodes: Lower right paratracheal node 0.9 cm in short axis, image 59 series 2. Lungs/Pleura: A mass centered in the right upper lobe measures 9.2 by 5.7 by 6.6 cm, with lobulations appearing to extend across the minor fissure into the right middle lobe, and across the major fissure into the right lower lobe.  Interstitial accentuation along the margins of the mass likely from postobstructive pneumonitis. Potential satellite lesion in the right upper lobe measures 1.6 by 1.4 by 1.4 cm. Small calcified nodules in the right middle lobe and right lower lobe compatible with old granulomatous disease. Noncalcified right lower lobe nodule 0.4 cm in diameter on image 83 series 4. Centrilobular emphysema. Partially calcified 0.4 cm left lower lobe nodule adjacent to the diaphragm, probably a benign granuloma. Similar 2 mm nodule on image 119 series 4. Other small calcified granulomas in the left lung are present. No pleural effusion is identified. Upper Abdomen: Punctate calcification in the liver compatible with old granulomatous disease. Faint capsular calcification along the posterolateral spleen, likely postinflammatory. 1.2 by 1.8 cm mass of the lateral limb left adrenal gland on image 151 series 2, previously shown to have a density of 3 Hounsfield units on noncontrast CT from 07/19/2021 compatible with adrenal adenoma. Otherwise minimal fullness of both adrenal  glands without a mass worrisome for metastatic lesion. Abdominal aortic atherosclerosis noted. Musculoskeletal: The large right upper lobe mass abuts the lateral pleural margin, without evidence of chest wall invasion or bony destructive findings in the adjacent ribs. Small Morgagni hernia containing adipose tissue on image 106 series 6. IMPRESSION: 1. 9.2 by 5.7 by 6.6 cm right upper lobe mass with lobulations appearing to extend across the minor fissure into the right middle lobe, and across the major fissure into the right lower lobe. Potential satellite lesion in the right upper lobe measures 1.6 by 1.4 by 1.4 cm. Appearance highly likely to reflect lung cancer. No findings of chest wall invasion or metastatic disease to the upper abdomen. 2. Borderline prominent lower right paratracheal lymph node at 0.9 cm in short axis. 3. Old granulomatous disease. 4. Small left adrenal adenoma. 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 6. Aortic atherosclerosis. 7. Emphysema. 8. Noncalcified right lower lobe nodule 0.4 cm in diameter. Aortic Atherosclerosis (ICD10-I70.0) and Emphysema (ICD10-J43.9). Electronically Signed   By: Gaylyn Rong M.D.   On: 04/13/2022 15:02   DG Chest 2 View  Result Date: 04/10/2022 CLINICAL DATA:  Pulmonary disease, shortness of breath for 1 year, smoker, history prostate cancer EXAM: CHEST - 2 VIEW COMPARISON:  06/17/2016 FINDINGS: Normal heart size, mediastinal contours, and pulmonary vascularity. Atherosclerotic calcification aorta. Large area of opacity in mid RIGHT lung either representing pneumonia or mass/neoplasm. Central peribronchial thickening. Remaining lungs clear. No pleural effusion, pneumothorax, or acute osseous findings. IMPRESSION: Bronchitic changes with large area of opacity in the mid RIGHT lung either representing pneumonia or tumor; either serial radiographic follow-up until resolution or CT chest with contrast recommended to exclude pulmonary neoplasm.  These results will be called to the ordering clinician or representative by the Radiologist Assistant, and communication documented in the PACS or [employer]. Electronically Signed   By: Ulyses Southward M.D.   On: 04/10/2022 16:22    Labs:  CBC: Recent Labs    04/23/22 0602 04/29/22 2225  WBC 9.0 9.1  HGB 14.3 14.5  HCT 41.0 42.1  PLT 337 324    COAGS: Recent Labs    04/29/22 2225  INR 1.0    BMP: Recent Labs    04/29/22 2225  NA 133*  K 4.1  CL 99  CO2 22  GLUCOSE 86  BUN 5*  CALCIUM 8.7*  CREATININE 0.60*  GFRNONAA >60    LIVER FUNCTION TESTS: Recent Labs    04/29/22 2225  BILITOT 0.6  AST 41  ALT 21  ALKPHOS 74  PROT 7.7  ALBUMIN 3.9     Assessment and Plan:  67 y.o. male outpatient. History of HLD, GERD. 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 Team is requesting portacath placement for chemotherapy access.   Labs from 4.15.24 are within acceptable parameters. Patient is on 81 mg of ASA. Allergies include cipro. Patient has been NPO since midnight.   Risks and benefits of image guided port-a-catheter placement was discussed with the patient including, but not limited to bleeding, infection, pneumothorax, or fibrin sheath development and need for additional procedures.  All of the patient's and his sister's questions were answered, patient  and his sisters are agreeable to proceed.  Consent signed and in chart.   Thank you for this interesting consult.  I greatly enjoyed meeting Jeury Mcnab [place] and look forward to participating in their care.  A copy of this report was sent to the requesting provider on this date.  Electronically Signed: Alene Mires, NP 04/30/2022, 9:55 AM   I spent a total of  30 Minutes   in face to face in clinical consultation, greater than 50% of which was counseling/coordinating care for portacath placement

## 2022-04-29 ENCOUNTER — Emergency Department (HOSPITAL_COMMUNITY): Payer: Medicare Other

## 2022-04-29 ENCOUNTER — Encounter (HOSPITAL_COMMUNITY): Payer: Self-pay

## 2022-04-29 ENCOUNTER — Emergency Department (HOSPITAL_COMMUNITY)
Admission: EM | Admit: 2022-04-29 | Discharge: 2022-04-30 | Disposition: A | Discharge status: Home / Self Care | Payer: Medicare Other | Attending: Student | Admitting: Student

## 2022-04-29 ENCOUNTER — Other Ambulatory Visit: Payer: Self-pay

## 2022-04-29 DIAGNOSIS — R042 Hemoptysis: Secondary | ICD-10-CM | POA: Diagnosis not present

## 2022-04-29 DIAGNOSIS — J449 Chronic obstructive pulmonary disease, unspecified: Secondary | ICD-10-CM | POA: Insufficient documentation

## 2022-04-29 DIAGNOSIS — Z7982 Long term (current) use of aspirin: Secondary | ICD-10-CM | POA: Insufficient documentation

## 2022-04-29 DIAGNOSIS — F17210 Nicotine dependence, cigarettes, uncomplicated: Secondary | ICD-10-CM | POA: Diagnosis not present

## 2022-04-29 DIAGNOSIS — Z85828 Personal history of other malignant neoplasm of skin: Secondary | ICD-10-CM | POA: Insufficient documentation

## 2022-04-29 DIAGNOSIS — Z8546 Personal history of malignant neoplasm of prostate: Secondary | ICD-10-CM | POA: Insufficient documentation

## 2022-04-29 DIAGNOSIS — R0602 Shortness of breath: Secondary | ICD-10-CM | POA: Diagnosis not present

## 2022-04-29 LAB — CBC WITH DIFFERENTIAL/PLATELET
Abs Immature Granulocytes: 0.07 10*3/uL (ref 0.00–0.07)
Basophils Absolute: 0.1 10*3/uL (ref 0.0–0.1)
Basophils Relative: 1 %
Eosinophils Absolute: 0.4 10*3/uL (ref 0.0–0.5)
Eosinophils Relative: 4 %
HCT: 42.1 % (ref 39.0–52.0)
Hemoglobin: 14.5 g/dL (ref 13.0–17.0)
Immature Granulocytes: 1 %
Lymphocytes Relative: 20 %
Lymphs Abs: 1.8 10*3/uL (ref 0.7–4.0)
MCH: 31.5 pg (ref 26.0–34.0)
MCHC: 34.4 g/dL (ref 30.0–36.0)
MCV: 91.3 fL (ref 80.0–100.0)
Monocytes Absolute: 0.8 10*3/uL (ref 0.1–1.0)
Monocytes Relative: 9 %
Neutro Abs: 6 10*3/uL (ref 1.7–7.7)
Neutrophils Relative %: 65 %
Platelets: 324 10*3/uL (ref 150–400)
RBC: 4.61 MIL/uL (ref 4.22–5.81)
RDW: 14.3 % (ref 11.5–15.5)
WBC: 9.1 10*3/uL (ref 4.0–10.5)
nRBC: 0 % (ref 0.0–0.2)

## 2022-04-29 LAB — COMPREHENSIVE METABOLIC PANEL
ALT: 21 U/L (ref 0–44)
AST: 41 U/L (ref 15–41)
Albumin: 3.9 g/dL (ref 3.5–5.0)
Alkaline Phosphatase: 74 U/L (ref 38–126)
Anion gap: 12 (ref 5–15)
BUN: 5 mg/dL — ABNORMAL LOW (ref 8–23)
CO2: 22 mmol/L (ref 22–32)
Calcium: 8.7 mg/dL — ABNORMAL LOW (ref 8.9–10.3)
Chloride: 99 mmol/L (ref 98–111)
Creatinine, Ser: 0.6 mg/dL — ABNORMAL LOW (ref 0.61–1.24)
GFR, Estimated: >60 mL/min (ref >60)
Glucose, Bld: 86 mg/dL (ref 70–99)
Potassium: 4.1 mmol/L (ref 3.5–5.1)
Sodium: 133 mmol/L — ABNORMAL LOW (ref 135–145)
Total Bilirubin: 0.6 mg/dL (ref 0.3–1.2)
Total Protein: 7.7 g/dL (ref 6.5–8.1)

## 2022-04-29 LAB — PROTIME-INR
INR: 1 (ref 0.8–1.2)
Prothrombin Time: 12.8 seconds (ref 11.4–15.2)

## 2022-04-29 MED ORDER — IOHEXOL 350 MG/ML SOLN
75.0000 mL | Freq: Once | INTRAVENOUS | Status: AC | PRN
  Administered 2022-04-29: 75 mL via INTRAVENOUS

## 2022-04-29 NOTE — ED Triage Notes (Signed)
Pt presents with hemoptysis that started today. Pt reports associated ShOB. Pt had a lung biopsy for CA 1 week ago.

## 2022-04-29 NOTE — ED Provider Notes (Signed)
[place] EMERGENCY DEPARTMENT AT [facility] Provider Note  CSN: [id number] Arrival date & time: 04/29/22 2118  Chief Complaint(s) Hemoptysis  HPI Carlos [name] is a 67 y.o. male with PMH tobacco abuse, COPD, Gleason 6 prostate cancer and surveillance, new right upper lobe lung mass status post biopsy on 04/23/2022 who presents emergency department for evaluation of hemoptysis.  Patient states that after biopsy he had scant hemoptysis, but over the last 4 hours he has had an increase in the amount of hemoptysis now coughing up bright red blood with blood clots.  He currently denies chest pain, shortness of breath, abdominal pain, nausea, vomiting or other systemic symptoms.  No blood thinner use.   Past Medical History Past Medical History:  Diagnosis Date   Anxiety    Cancer    Skin cancer left shoulder   Chronic low back pain    Complication of anesthesia    Dermatofibrosarcoma protubera of right shoulder    Dyspnea    GERD (gastroesophageal reflux disease)    Hyperlipidemia    PONV (postoperative nausea and vomiting)    Patient Active Problem List   Diagnosis Date Noted   Mediastinal adenopathy 04/23/2022   Mass of upper lobe of right lung 04/18/2022   Lung mass 04/18/2022   Prostate cancer (HCC); PSA 4.9; Gleason 6; low risk 10/18/2021   BPH with obstruction/lower urinary tract symptoms 10/18/2021   Special screening for malignant neoplasms, colon 08/22/2015   Skin cancer of trunk 07/09/2012   Lumbago 01/22/2012   Home Medication(s) Prior to Admission medications   Medication Sig Start Date End Date Taking? Authorizing Provider  acetaminophen (TYLENOL) 500 MG tablet Take 1,000 mg by mouth every 6 (six) hours as needed (pain.).    [provider]  aspirin EC 81 MG tablet Take 81 mg by mouth at bedtime. Swallow whole.    [provider]  clonazePAM (KLONOPIN) 1 MG disintegrating tablet Take 1 mg by mouth in the morning and at bedtime.     [provider]  meloxicam (MOBIC) 15 MG tablet Take 15 mg by mouth in the morning. 09/12/20   [provider]  pantoprazole (PROTONIX) 40 MG tablet Take 40 mg by mouth daily. 09/12/20   [provider]  simvastatin (ZOCOR) 20 MG tablet Take 20 mg by mouth at bedtime. 04/25/16   [provider]  tamsulosin (FLOMAX) 0.4 MG CAPS capsule Take 1 capsule (0.4 mg total) by mouth daily. Patient taking differently: Take 0.4 mg by mouth at bedtime. 09/26/21   Stoneking, Danford Bad., MD                                                                                                                                    Past Surgical History Past Surgical History:  Procedure Laterality Date   APPENDECTOMY     APPLICATION OF A-CELL OF EXTREMITY Right 07/09/2012   Procedure: PLACEMENT OF  A-CELL TO UPPER EXTREMITY/PLACEMENT OF VAC;  Surgeon: Wayland Denis, DO;  Location: [facility];  Service: Plastics;  Laterality: Right;   BRONCHIAL BRUSHINGS  04/23/2022   Procedure: BRONCHIAL BRUSHINGS;  Surgeon: Leslye Peer, MD;  Location: [facility] ENDOSCOPY;  Service: Pulmonary;;   BRONCHIAL NEEDLE ASPIRATION BIOPSY  04/23/2022   Procedure: BRONCHIAL NEEDLE ASPIRATION BIOPSIES;  Surgeon: Leslye Peer, MD;  Location: [facility] ENDOSCOPY;  Service: Pulmonary;;   COLONOSCOPY WITH PROPOFOL N/A 09/30/2015   Procedure: COLONOSCOPY WITH PROPOFOL;  Surgeon: Malissa Hippo, MD;  Location: [facility] ENDO SUITE;  Service: Endoscopy;  Laterality: N/A;  830   EXCISION DERMATOFIBROCARCOMA PROTUBERAN RIGHT SHOULDER  2 - 3 WKS AGO   HEMOSTASIS CONTROL  04/23/2022   Procedure: HEMOSTASIS CONTROL;  Surgeon: Leslye Peer, MD;  Location: [facility] ENDOSCOPY;  Service: Pulmonary;;   INGUINAL HERNIA REPAIR Left 1999   KNEE ARTHROSCOPY Left 2012   POLYPECTOMY  09/30/2015   Procedure: POLYPECTOMY;  Surgeon: Malissa Hippo, MD;  Location: [facility] ENDO SUITE;  Service: Endoscopy;;  sigmoid   VIDEO BRONCHOSCOPY WITH ENDOBRONCHIAL  ULTRASOUND Bilateral 04/23/2022   Procedure: VIDEO BRONCHOSCOPY WITH ENDOBRONCHIAL ULTRASOUND;  Surgeon: Leslye Peer, MD;  Location: [facility] ENDOSCOPY;  Service: Pulmonary;  Laterality: Bilateral;   VIDEO BRONCHOSCOPY WITH RADIAL ENDOBRONCHIAL ULTRASOUND  04/23/2022   Procedure: VIDEO BRONCHOSCOPY WITH RADIAL ENDOBRONCHIAL ULTRASOUND;  Surgeon: Leslye Peer, MD;  Location: [facility] ENDOSCOPY;  Service: Pulmonary;;   Family History Family History  Problem Relation Age of Onset   Dementia Mother    Cancer Father     Social History Social History   Tobacco Use   Smoking status: Every Day    Packs/day: 1.50    Years: 40.00    Additional pack years: 0.00    Total pack years: 60.00    Types: Cigarettes   Smokeless tobacco: Never  Vaping Use   Vaping Use: Never used  Substance Use Topics   Alcohol use: Yes    Comment: 2-3 cans beer/day   Drug use: No   Allergies Ciprofloxacin and Gabapentin  Review of Systems Review of Systems  Respiratory:  Positive for cough.        Hemoptysis    Physical Exam Vital Signs  I have reviewed the triage vital signs BP 128/76 (BP Location: Right Arm)   Pulse 89   Temp 97.7 F (36.5 C) (Oral)   Resp 20   Ht 5\' 3"  (1.6 m)   Wt 81.6 kg   SpO2 95%   BMI 31.89 kg/m   Physical Exam Constitutional:      General: He is not in acute distress.    Appearance: Normal appearance.  HENT:     Head: Normocephalic and atraumatic.     Nose: No congestion or rhinorrhea.  Eyes:     General:        Right eye: No discharge.        Left eye: No discharge.     Extraocular Movements: Extraocular movements intact.     Pupils: Pupils are equal, round, and reactive to light.  Cardiovascular:     Rate and Rhythm: Normal rate and regular rhythm.     Heart sounds: No murmur heard. Pulmonary:     Effort: No respiratory distress.     Breath sounds: Wheezing present. No rales.  Abdominal:     General: There is no distension.     Tenderness: There is no  abdominal tenderness.  Musculoskeletal:  General: Normal range of motion.     Cervical back: Normal range of motion.  Skin:    General: Skin is warm and dry.  Neurological:     General: No focal deficit present.     Mental Status: He is alert.     ED Results and Treatments Labs (all labs ordered are listed, but only abnormal results are displayed) Labs Reviewed  COMPREHENSIVE METABOLIC PANEL  CBC WITH DIFFERENTIAL/PLATELET  PROTIME-INR                                                                                                                          Radiology No results found.  Pertinent labs & imaging results that were available during my care of the patient were reviewed by me and considered in my medical decision making (see MDM for details).  Medications Ordered in ED Medications - No data to display                                                                                                                                   Procedures Procedures  (including critical care time)  Medical Decision Making / ED Course   This patient presents to the ED for concern of ***, this involves an extensive number of treatment options, and is a complaint that carries with it a high risk of complications and morbidity.  The differential diagnosis includes ***  MDM: ***   Additional history obtained: -Additional history obtained from *** -External records from outside source obtained and reviewed including: Chart review including previous notes, labs, imaging, consultation notes   Lab Tests: -I ordered, reviewed, and interpreted labs.   The pertinent results include:   Labs Reviewed  COMPREHENSIVE METABOLIC PANEL  CBC WITH DIFFERENTIAL/PLATELET  PROTIME-INR      EKG ***  EKG Interpretation  Date/Time:    Ventricular Rate:    PR Interval:    QRS Duration:   QT Interval:    QTC Calculation:   R Axis:     Text Interpretation:           Imaging  Studies ordered: I ordered imaging studies including *** I independently visualized and interpreted imaging. I agree with the radiologist interpretation   Medicines ordered and prescription drug management: No orders of the defined types were placed in this encounter.   -I have reviewed the patients home medicines and have made adjustments  as needed  Critical interventions ***  Consultations Obtained: I requested consultation with the ***,  and discussed lab and imaging findings as well as pertinent plan - they recommend: ***   Cardiac Monitoring: The patient was maintained on a cardiac monitor.  I personally viewed and interpreted the cardiac monitored which showed an underlying rhythm of: ***  Social Determinants of Health:  Factors impacting patients care include: ***   Reevaluation: After the interventions noted above, I reevaluated the patient and found that they have :{resolved/improved/worsened:23923::"improved"}  Co morbidities that complicate the patient evaluation  Past Medical History:  Diagnosis Date   Anxiety    Cancer    Skin cancer left shoulder   Chronic low back pain    Complication of anesthesia    Dermatofibrosarcoma protubera of right shoulder    Dyspnea    GERD (gastroesophageal reflux disease)    Hyperlipidemia    PONV (postoperative nausea and vomiting)       Dispostion: I considered admission for this patient, ***     Final Clinical Impression(s) / ED Diagnoses Final diagnoses:  None     @PCDICTATION @

## 2022-04-30 ENCOUNTER — Ambulatory Visit
Admission: RE | Admit: 2022-04-30 | Discharge: 2022-04-30 | Disposition: A | Discharge status: Home / Self Care | Payer: Medicare Other | Source: Ambulatory Visit | Attending: Hematology | Admitting: Hematology

## 2022-04-30 ENCOUNTER — Encounter: Payer: Self-pay | Admitting: Radiology

## 2022-04-30 ENCOUNTER — Encounter: Payer: Self-pay | Admitting: Interventional Radiology

## 2022-04-30 ENCOUNTER — Other Ambulatory Visit: Payer: Self-pay

## 2022-04-30 DIAGNOSIS — C3490 Malignant neoplasm of unspecified part of unspecified bronchus or lung: Secondary | ICD-10-CM | POA: Diagnosis not present

## 2022-04-30 DIAGNOSIS — C3411 Malignant neoplasm of upper lobe, right bronchus or lung: Secondary | ICD-10-CM | POA: Diagnosis not present

## 2022-04-30 DIAGNOSIS — R918 Other nonspecific abnormal finding of lung field: Secondary | ICD-10-CM

## 2022-04-30 DIAGNOSIS — K219 Gastro-esophageal reflux disease without esophagitis: Secondary | ICD-10-CM | POA: Diagnosis not present

## 2022-04-30 DIAGNOSIS — Z452 Encounter for adjustment and management of vascular access device: Secondary | ICD-10-CM | POA: Diagnosis not present

## 2022-04-30 DIAGNOSIS — C3491 Malignant neoplasm of unspecified part of right bronchus or lung: Secondary | ICD-10-CM | POA: Diagnosis not present

## 2022-04-30 DIAGNOSIS — Z87891 Personal history of nicotine dependence: Secondary | ICD-10-CM | POA: Diagnosis not present

## 2022-04-30 HISTORY — PX: IR IMAGING GUIDED PORT INSERTION: IMG5740

## 2022-04-30 MED ORDER — LIDOCAINE-EPINEPHRINE 1 %-1:100000 IJ SOLN
17.0000 mL | Freq: Once | INTRAMUSCULAR | Status: AC
  Administered 2022-04-30: 17 mL

## 2022-04-30 MED ORDER — FENTANYL CITRATE (PF) 100 MCG/2ML IJ SOLN
INTRAMUSCULAR | Status: AC
  Filled 2022-04-30: qty 2

## 2022-04-30 MED ORDER — FENTANYL CITRATE (PF) 100 MCG/2ML IJ SOLN
INTRAMUSCULAR | Status: AC | PRN
  Administered 2022-04-30: 25 ug via INTRAVENOUS
  Administered 2022-04-30: 50 ug via INTRAVENOUS

## 2022-04-30 MED ORDER — MIDAZOLAM HCL 2 MG/2ML IJ SOLN
INTRAMUSCULAR | Status: AC | PRN
  Administered 2022-04-30: 2 mg via INTRAVENOUS
  Administered 2022-04-30: 1 mg via INTRAVENOUS

## 2022-04-30 MED ORDER — HEPARIN SOD (PORK) LOCK FLUSH 100 UNIT/ML IV SOLN
500.0000 [IU] | Freq: Once | INTRAVENOUS | Status: AC
  Administered 2022-04-30: 500 [IU] via INTRAVENOUS

## 2022-04-30 MED ORDER — LIDOCAINE-EPINEPHRINE 1 %-1:100000 IJ SOLN
INTRAMUSCULAR | Status: AC
  Filled 2022-04-30: qty 1

## 2022-04-30 MED ORDER — SODIUM CHLORIDE 0.9 % IV SOLN: INTRAVENOUS | Status: DC

## 2022-04-30 MED ORDER — LIDOCAINE-EPINEPHRINE (PF) 1 %-1:200000 IJ SOLN: 17.0000 mL | Freq: Once | INTRAMUSCULAR | Status: DC

## 2022-04-30 MED ORDER — MIDAZOLAM HCL 2 MG/2ML IJ SOLN
INTRAMUSCULAR | Status: AC
  Filled 2022-04-30: qty 4

## 2022-04-30 MED ORDER — HEPARIN SOD (PORK) LOCK FLUSH 100 UNIT/ML IV SOLN
INTRAVENOUS | Status: AC
  Filled 2022-04-30: qty 5

## 2022-04-30 NOTE — Procedures (Signed)
Interventional Radiology Procedure Note  Procedure: Placement of a LEFT IJ approach single lumen PowerPort.  Tip is positioned at the superior cavoatrial junction and catheter is ready for immediate use.  Complications: No immediate Recommendations:  - Ok to shower tomorrow - Do not submerge for 7 days - Routine line care   Signed,  Sheehan Stacey K. Torrin Frein, MD   

## 2022-04-30 NOTE — ED Provider Notes (Signed)
Care of the patient assumed at the change of shift. Here with hemoptysis after recent lung mass biopsy. Not a large volume of blood and no further episodes since arrival. CTA pending at the change of shift does not show any active bleeding. He is scheduled for Port placement in the AM. Encouraged to follow up with his pulmonologist and RTED for any return of bleeding in the meantime.    Pollyann Savoy, MD 04/30/22 Burna Mortimer

## 2022-04-30 NOTE — Progress Notes (Signed)
[facility] [address], [place] [address]    Clinic Day:  05/01/2022  Referring physician: Benita Stabile, MD  Patient Care Team: Benita Stabile, MD as PCP - General (Internal Medicine)   ASSESSMENT & PLAN:   Assessment: 1.  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  No hemoptysis/weight loss. - Chest x-ray (04/10/2022): Mid right lung mass. - CT chest (04/13/2022): 9.2 x 5.7 x 6.6 cm right upper lobe mass with lobulations appearing to extend across the minor fissure into the right middle lobe and right lower lobe.  Satellite lesion in the right upper lobe measures 1.6 x 1.4 x 1.4 cm.  No findings of chest wall invasion.  Borderline prominent lower right paratracheal lymph node 0.9 cm.  Old granulomatous disease.  Small left adrenal adenoma. - Has right posterior chest wall pain for the past 6 months, dull type.  He is not requiring any pain medication. - Brain MRI (04/19/2022): No metastatic disease. - PET scan (04/19/2022): 9 x 5.7 cm right upper lobe mass with SUV 11.8.  1.3 x 0.9 cm satellite nodule in the right upper lobe with SUV 3.8.  No adenopathy or distant metastatic disease. - Bronchoscopy and biopsy by Dr. Delton Coombes - Pathology (04/23/2022): Both right upper lobe lung nodule FNA consistent with poorly differentiated carcinoma.  IHC diffusely positive for TTF-1, Napsin, synaptophysin and CK7.  CD56 negative.  P63 and CK5/6 negative.  Given Napsin positivity, favor adenocarcinoma.  There is also diffuse synaptophysin positivity consistent with neuroendocrine differentiation.  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   2.  Social/family history: - He is seen with his 2 sisters today.  Lives by himself and is independent of ADLs and IADLs.  He retired after working at [employer] for the last 34 years.  No asbestos exposure.  No other  chemicals.  Smokes more than 1 pack/day for the last 50 years (started at age 63) quit smoking for 5 years before starting back again. - Brother died of lung cancer.  Father had bladder cancer.  Maternal uncle had cancer.    Plan: 1.  Stage IIIa (T4 N0 M0) adenocarcinoma of the right lung: - We reviewed images of the PET scan with the patient and his sisters in detail. - We also discussed pathology report which was ruling out small cell carcinoma.  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 - He already has port placed. - I will reach out to Dr. Dorris Fetch.  If he is a potentially surgical candidate, we will do chemoimmunotherapy.  Otherwise he will be offered definitive chemoradiation therapy followed by consolidation immunotherapy. - We will order PFTs.  We will also refer to radiation oncology.   2.  Anxiety/depression: - Currently taking clonazepam 1 mg twice daily.  Reported it is not helping.  Unable to sleep. - Increase clonazepam to 1 mg in the morning and 2 mg at bedtime.     Orders Placed This Encounter  Procedures   Pulmonary Function Test    Standing Status:   Future    Standing Expiration Date:   05/01/2023    Order Specific Question:   Where should this test be performed?    Answer:   [facility]    Order Specific Question:   Full PFT: includes the following: basic spirometry, spirometry pre & post bronchodilator, diffusion capacity (DLCO),  lung volumes    Answer:   Full PFT      I,Katie Daubenspeck,acting as a scribe for Doreatha Massed, MD.,have documented all relevant documentation on the behalf of Doreatha Massed, MD,as directed by  Doreatha Massed, MD while in the presence of Doreatha Massed, MD.   I, Doreatha Massed MD, have reviewed the above documentation for accuracy and completeness, and I agree with the above.   Doreatha Massed, MD   4/23/20245:30 PM  CHIEF COMPLAINT:   Diagnosis: RUL lung mass     Cancer Staging  Non-small cell cancer of right lung Staging form: Lung, AJCC 8th Edition - Clinical stage from 05/01/2022: Stage IIIA (cT4, cN0, cM0) - Unsigned    Prior Therapy: none  Current Therapy: Under workup   HISTORY OF PRESENT ILLNESS:   Oncology History   No history exists.     INTERVAL HISTORY:   [place] is a 67 y.o. male presenting to clinic today for follow up of RUL lung mass. He was last seen by me on 04/18/22 in consultation.  Since his last visit, he underwent staging scans on 04/19/22. Brain MRI showed no evidence of intracranial metastases. PET scan showed: 9 cm RUL mass has max SUV of 11.8; 1.3 cm satellite nodule in RUL with max SUV of 3.8; no findings of hypermetabolic adenopathy or distant metastatic disease.  He also underwent bronchoscopy on 04/23/22 under Dr. Delton Coombes. Cytology from both RUL targets confirmed poorly differentiated carcinoma. Per pathologist's note, subclassification is difficult given mixed immunohistochemistry results.   Following bronchoscopy, he developed hemoptysis and presented to the ED on 04/29/22. CTA chest was negative for any active bleeding.  He also had port placed yesterday, 04/30/22.  Today, he states that he is doing well overall. His appetite level is at 10%. His energy level is at 60%.  PAST MEDICAL HISTORY:   Past Medical History: Past Medical History:  Diagnosis Date   Anxiety    Cancer    Skin cancer left shoulder   Chronic low back pain    Complication of anesthesia    Dermatofibrosarcoma protubera of right shoulder    Dyspnea    GERD (gastroesophageal reflux disease)    Hyperlipidemia    PONV (postoperative nausea and vomiting)     Surgical History: Past Surgical History:  Procedure Laterality Date   APPENDECTOMY     APPLICATION OF A-CELL OF EXTREMITY Right 07/09/2012   Procedure: PLACEMENT OF A-CELL TO UPPER EXTREMITY/PLACEMENT OF VAC;  Surgeon: Wayland Denis, DO;  Location: [facility];   Service: Plastics;  Laterality: Right;   BRONCHIAL BRUSHINGS  04/23/2022   Procedure: BRONCHIAL BRUSHINGS;  Surgeon: Leslye Peer, MD;  Location[facility];  Service: Pulmonary;;   BRONCHIAL NEEDLE ASPIRATION BIOPSY  04/23/2022   Procedure: BRONCHIAL NEEDLE ASPIRATION BIOPSIES;  Surgeon: Leslye Peer, MD;  Location: [facility] ENDOSCOPY;  Service: Pulmonary;;   COLONOSCOPY WITH PROPOFOL N/A 09/30/2015   Procedure: COLONOSCOPY WITH PROPOFOL;  Surgeon: Malissa Hippo, MD;  Location: [facility];  Service: Endoscopy;  Laterality: N/A;  830   EXCISION DERMATOFIBROCARCOMA PROTUBERAN RIGHT SHOULDER  2 - 3 WKS AGO   HEMOSTASIS CONTROL  04/23/2022   Procedure: HEMOSTASIS CONTROL;  Surgeon: Leslye Peer, MD;  Location: [facility] ENDOSCOPY;  Service: Pulmonary;;   INGUINAL HERNIA REPAIR Left 1999   IR IMAGING GUIDED PORT INSERTION  04/30/2022   KNEE ARTHROSCOPY Left 2012   POLYPECTOMY  09/30/2015   Procedure: POLYPECTOMY;  Surgeon: Malissa Hippo, MD;  Location:  [facility];  Service: Endoscopy;;  sigmoid   VIDEO BRONCHOSCOPY WITH ENDOBRONCHIAL ULTRASOUND Bilateral 04/23/2022   Procedure: VIDEO BRONCHOSCOPY WITH ENDOBRONCHIAL ULTRASOUND;  Surgeon: Leslye Peer, MD;  Location: [facility] ENDOSCOPY;  Service: Pulmonary;  Laterality: Bilateral;   VIDEO BRONCHOSCOPY WITH RADIAL ENDOBRONCHIAL ULTRASOUND  04/23/2022   Procedure: VIDEO BRONCHOSCOPY WITH RADIAL ENDOBRONCHIAL ULTRASOUND;  Surgeon: Leslye Peer, MD;  Location: [facility] ENDOSCOPY;  Service: Pulmonary;;    Social History: Social History   Socioeconomic History   Marital status: Divorced    Spouse name: Burna Mortimer   Number of children: 1   Years of education: 10th   Highest education level: Not on file  Occupational History    Employer: [employer]    Comment: [employer]  Tobacco Use   Smoking status: Every Day    Packs/day: 1.00    Years: 40.00    Additional pack years: 0.00    Total pack years: 40.00    Types: Cigarettes    Smokeless tobacco: Never  Vaping Use   Vaping Use: Never used  Substance and Sexual Activity   Alcohol use: Yes    Comment: 2-3 cans beer/day   Drug use: No   Sexual activity: Not on file  Other Topics Concern   Not on file  Social History Narrative   Patient lives at home with his dog. 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 Patient has 10 grade education.Patient is right-handed.Caffeine Use: 1 cup of coffee; 3-4 sodas daily   Social Determinants of Health   Financial Resource Strain: Not on file  Food Insecurity: No Food Insecurity (04/18/2022)   Hunger Vital Sign    Worried About Running Out of Food in the Last Year: Never true    Ran Out of Food in the Last Year: Never true  Transportation Needs: No Transportation Needs (04/18/2022)   PRAPARE - Administrator, Civil Service (Medical): No    Lack of Transportation (Non-Medical): No  Physical Activity: Not on file  Stress: Not on file  Social Connections: Not on file  Intimate Partner Violence: Not At Risk (04/18/2022)   Humiliation, Afraid, Rape, and Kick questionnaire    Fear of Current or Ex-Partner: No    Emotionally Abused: No    Physically Abused: No    Sexually Abused: No    Family History: Family History  Problem Relation Age of Onset   Dementia Mother    Cancer Father     Current Medications:  Current Outpatient Medications:    acetaminophen (TYLENOL) 500 MG tablet, Take 1,000 mg by mouth every 6 (six) hours as needed (pain.)., Disp: , Rfl:    aspirin EC 81 MG tablet, Take 81 mg by mouth at bedtime. Swallow whole., Disp: , Rfl:    meloxicam (MOBIC) 15 MG tablet, Take 15 mg by mouth in the morning., Disp: , Rfl:    pantoprazole (PROTONIX) 40 MG tablet, Take 40 mg by mouth daily., Disp: , Rfl:    simvastatin (ZOCOR) 20 MG tablet, Take 20 mg by mouth at bedtime., Disp: , Rfl: 2   tamsulosin (FLOMAX) 0.4 MG CAPS capsule, Take 1 capsule (0.4 mg total) by mouth daily.  (Patient taking differently: Take 0.4 mg by mouth at bedtime.), Disp: 30 capsule, Rfl: 11   clonazePAM (KLONOPIN) 1 MG disintegrating tablet, Take one tablet by mouth every morning and two tablets by mouth at bedtime, Disp: 90 tablet, Rfl: 2   Allergies: Allergies  Allergen Reactions  Ciprofloxacin Swelling and Other (See Comments)    Swelling in hands, numbness in arms and joint pain   Gabapentin Other (See Comments)    Double vision    REVIEW OF SYSTEMS:   Review of Systems  Constitutional:  Negative for chills, fatigue and fever.  HENT:   Negative for lump/mass, mouth sores, nosebleeds, sore throat and trouble swallowing.   Eyes:  Negative for eye problems.  Respiratory:  Positive for cough and shortness of breath.   Cardiovascular:  Negative for chest pain, leg swelling and palpitations.  Gastrointestinal:  Positive for nausea and vomiting. Negative for abdominal pain, constipation and diarrhea.  Genitourinary:  Negative for bladder incontinence, difficulty urinating, dysuria, frequency, hematuria and nocturia.   Musculoskeletal:  Negative for arthralgias, back pain, flank pain, myalgias and neck pain.  Skin:  Negative for itching and rash.  Neurological:  Positive for dizziness. Negative for headaches and numbness.  Hematological:  Does not bruise/bleed easily.  Psychiatric/Behavioral:  Positive for sleep disturbance. Negative for depression and suicidal ideas. The patient is nervous/anxious.   All other systems reviewed and are negative.    VITALS:   Blood pressure (!) 151/83, pulse 78, temperature 98.3 F (36.8 C), temperature source Oral, resp. rate 17, height 5\' 6"  (1.676 m), weight 182 lb 4.8 oz (82.7 kg), SpO2 97 %.  Wt Readings from Last 3 Encounters:  05/01/22 182 lb 4.8 oz (82.7 kg)  04/30/22 179 lb 14.3 oz (81.6 kg)  04/29/22 180 lb (81.6 kg)    Body mass index is 29.42 kg/m.  Performance status (ECOG): 1 - Symptomatic but completely ambulatory  PHYSICAL  EXAM:   Physical Exam Vitals and nursing note reviewed. Exam conducted with a chaperone present.  Constitutional:      Appearance: Normal appearance.  Cardiovascular:     Rate and Rhythm: Normal rate and regular rhythm.     Pulses: Normal pulses.     Heart sounds: Normal heart sounds.  Pulmonary:     Effort: Pulmonary effort is normal.     Breath sounds: Normal breath sounds.  Abdominal:     Palpations: Abdomen is soft. There is no hepatomegaly, splenomegaly or mass.     Tenderness: There is no abdominal tenderness.  Musculoskeletal:     Right lower leg: No edema.     Left lower leg: No edema.  Lymphadenopathy:     Cervical: No cervical adenopathy.     Right cervical: No superficial, deep or posterior cervical adenopathy.    Left cervical: No superficial, deep or posterior cervical adenopathy.     Upper Body:     Right upper body: No supraclavicular or axillary adenopathy.     Left upper body: No supraclavicular or axillary adenopathy.  Neurological:     General: No focal deficit present.     Mental Status: He is alert and oriented to person, place, and time.  Psychiatric:        Mood and Affect: Mood normal.        Behavior: Behavior normal.     LABS:      Latest Ref Rng & Units 04/29/2022   10:25 PM 04/23/2022    6:02 AM 06/17/2016   11:10 AM  CBC  WBC 4.0 - 10.5 K/uL 9.1  9.0  13.6   Hemoglobin 13.0 - 17.0 g/dL 16.1  09.6  04.5   Hematocrit 39.0 - 52.0 % 42.1  41.0  42.6   Platelets 150 - 400 K/uL 324  337  342  Latest Ref Rng & Units 04/29/2022   10:25 PM 06/17/2016   11:10 AM 09/23/2015   12:28 PM  CMP  Glucose 70 - 99 mg/dL 86  161  95   BUN 8 - 23 mg/dL 5  6  10    Creatinine 0.61 - 1.24 mg/dL 0.96  0.45  4.09   Sodium 135 - 145 mmol/L 133  132  137   Potassium 3.5 - 5.1 mmol/L 4.1  3.8  4.4   Chloride 98 - 111 mmol/L 99  99  102   CO2 22 - 32 mmol/L 22  23  28    Calcium 8.9 - 10.3 mg/dL 8.7  8.8  9.2   Total Protein 6.5 - 8.1 g/dL 7.7  7.0    Total  Bilirubin 0.3 - 1.2 mg/dL 0.6  0.7    Alkaline Phos 38 - 126 U/L 74  49    AST 15 - 41 U/L 41  21    ALT 0 - 44 U/L 21  21       No results found for: "CEA1", "CEA" / No results found for: "CEA1", "CEA" Lab Results  Component Value Date   PSA1 4.7 (H) 04/12/2022   No results found for: "WJX914" No results found for: "CAN125"  No results found for: "TOTALPROTELP", "ALBUMINELP", "A1GS", "A2GS", "BETS", "BETA2SER", "GAMS", "MSPIKE", "SPEI" No results found for: "TIBC", "FERRITIN", "IRONPCTSAT" No results found for: "LDH"   STUDIES:   IR IMAGING GUIDED PORT INSERTION  Result Date: 04/30/2022 INDICATION: 67 year old male with right-sided lung cancer. He presents for placement of a port catheter to establish durable venous access. EXAM: IMPLANTED PORT A CATH PLACEMENT WITH ULTRASOUND AND FLUOROSCOPIC GUIDANCE MEDICATIONS: None. ANESTHESIA/SEDATION: Versed 3 mg IV; Fentanyl 75 mcg IV; Moderate Sedation Time:  16 minutes The patient's vital signs and level of consciousness were continuously monitored during the procedure by the interventional radiology nurse under my direct supervision. FLUOROSCOPY: Radiation exposure index: 1 mGy COMPLICATIONS: None immediate. PROCEDURE: The right neck and chest was prepped with chlorhexidine, and draped in the usual sterile fashion using maximum barrier technique (cap and mask, sterile gown, sterile gloves, large sterile sheet, hand hygiene and cutaneous antiseptic). Local anesthesia was attained by infiltration with 1% lidocaine with epinephrine. Ultrasound demonstrated patency of the left internal jugular vein, and this was documented with an image. Under real-time ultrasound guidance, this vein was accessed with a 21 gauge micropuncture needle and image documentation was performed. A small dermatotomy was made at the access site with an 11 scalpel. A 0.018" wire was advanced into the SVC and the access needle exchanged for a 51F micropuncture vascular sheath. The  0.018" wire was then removed and a 0.035" wire advanced into the IVC. An appropriate location for the subcutaneous reservoir was selected below the clavicle and an incision was made through the skin and underlying soft tissues. The subcutaneous tissues were then dissected using a combination of blunt and sharp surgical technique and a pocket was formed. A single lumen power injectable portacatheter was then tunneled through the subcutaneous tissues from the pocket to the dermatotomy and the port reservoir placed within the subcutaneous pocket. The venous access site was then serially dilated and a peel away vascular sheath placed over the wire. The wire was removed and the port catheter advanced into position under fluoroscopic guidance. The catheter tip is positioned in the upper right atrium. This was documented with a spot image. The portacatheter was then tested and found to flush and aspirate well.  The port was flushed with saline followed by 100 units/mL heparinized saline. The pocket was then closed in two layers using first subdermal inverted interrupted absorbable sutures followed by a running subcuticular suture. The epidermis was then sealed with Dermabond. The dermatotomy at the venous access site was also closed with Dermabond. IMPRESSION: Successful placement of a left IJ approach Power Port with ultrasound and fluoroscopic guidance. The catheter is ready for use. Electronically Signed   By: Malachy Moan M.D.   On: 04/30/2022 16:13   CT Angio Chest PE W and/or Wo Contrast  Result Date: 04/29/2022 CLINICAL DATA:  Pulmonary embolism (PE) suspected, high prob. Hemoptysis, shortness of breath EXAM: CT ANGIOGRAPHY CHEST WITH CONTRAST TECHNIQUE: Multidetector CT imaging of the chest was performed using the standard protocol during bolus administration of intravenous contrast. Multiplanar CT image reconstructions and MIPs were obtained to evaluate the vascular anatomy. RADIATION DOSE REDUCTION: This  exam was performed according to the departmental dose-optimization program which includes automated exposure control, adjustment of the mA and/or kV according to patient size and/or use of iterative reconstruction technique. CONTRAST:  75mL OMNIPAQUE IOHEXOL 350 MG/ML SOLN COMPARISON:  04/13/2022 FINDINGS: Cardiovascular: No filling defects in the pulmonary arteries to suggest pulmonary emboli. Heart is normal size. Aorta is normal caliber. Scattered aortic calcifications. Mediastinum/Nodes: No mediastinal, hilar, or axillary adenopathy. Trachea and esophagus are unremarkable. Thyroid unremarkable. Lungs/Pleura: 9.1 cm right upper lobe mass is again noted as seen on prior study compatible with malignancy. Spiculated right upper lobe nodule measures 2.0 x 1.6 cm, similar to prior study, compatible with malignancy. No effusions. Left lung clear. Upper Abdomen: No acute findings Musculoskeletal: Chest wall soft tissues are unremarkable. No acute bony abnormality. Review of the MIP images confirms the above findings. IMPRESSION: No evidence of pulmonary embolus. Stable 9.1 cm right upper lobe mass hand spiculated 2 cm right upper lobe nodule. Findings compatible with malignancy. Aortic Atherosclerosis (ICD10-I70.0). Electronically Signed   By: Charlett Nose M.D.   On: 04/29/2022 23:27   DG Chest Port 1 View  Result Date: 04/23/2022 CLINICAL DATA:  S/P bronchoscopy with biopsy EXAM: PORTABLE CHEST 1 VIEW COMPARISON:  Chest x-ray April 10, 2022. FINDINGS: Mildly increased size of masslike consolidation in the right midlung. No visible pneumothorax on this semi erect radiograph. Left lung is clear. No visible pleural effusions. Cardiomediastinal silhouette is within normal limits. IMPRESSION: Mildly increased size of masslike consolidation in the right midlung. No visible pneumothorax on this semi erect radiograph. Electronically Signed   By: Feliberto Harts M.D.   On: 04/23/2022 11:19   DG C-ARM  BRONCHOSCOPY  Result Date: 04/23/2022 C-ARM BRONCHOSCOPY: Fluoroscopy was utilized by the requesting physician.  No radiographic interpretation.   NM PET Image Initial (PI) Skull Base To Thigh  Result Date: 04/23/2022 CLINICAL DATA:  Initial treatment strategy for right lung mass. EXAM: NUCLEAR MEDICINE PET SKULL BASE TO THIGH TECHNIQUE: 9.8 mCi F-18 FDG was injected intravenously. Full-ring PET imaging was performed from the skull base to thigh after the radiotracer. CT data was obtained and used for attenuation correction and anatomic localization. Fasting blood glucose: 95 mg/dl COMPARISON:  CT chest 16/10/9602 FINDINGS: Mediastinal blood pool activity: SUV max 2.8 Liver activity: SUV max NA NECK: No significant abnormal hypermetabolic activity in this region. Incidental CT findings: Chronic right maxillary sinusitis. Old right medial orbital wall fracture incidentally noted. CHEST: 9.0 by 5.7 cm mass centered in the right upper lobe has a maximum SUV of 11.8, compatible with malignancy. Satellite nodule measuring  1.3 by 0.9 cm in the right upper lobe on image 58 series 7 has maximum SUV of 3.8, compatible with malignancy. There is evidence of old granulomatous disease. Small nodules under 5 mm in size are noted for example in the posterior basal segment left lower lobe, and are below sensitive PET-CT size thresholds. 0.7 cm right lower paratracheal lymph node on image 87 series 3 has maximum SUV of 2.8, equal to the blood pool. Minimal accentuated distal esophageal activity with maximum SUV of 3.9, probably physiologic. Incidental CT findings: Coronary, aortic arch, and branch vessel atherosclerotic vascular disease. ABDOMEN/PELVIS: No significant abnormal hypermetabolic activity in this region. Incidental CT findings: Diffuse hepatic steatosis. Atherosclerosis is present, including aortoiliac atherosclerotic disease. Old granulomatous disease involving the liver. Small benign left adrenal adenoma. Mild  prostatomegaly. SKELETON: No significant abnormal hypermetabolic activity in this region. Incidental note made of activity along the left hand injection site. Incidental CT findings: No significant abnormal hypermetabolic activity in this region. IMPRESSION: 1. 9.0 by 5.7 cm right upper lobe mass has a maximum SUV of 11.8, compatible with malignancy. 2. 1.3 by 0.9 cm satellite nodule in the right upper lobe has a maximum SUV of 3.8, compatible with malignancy. 3. No findings of hypermetabolic adenopathy or distant metastatic disease. 4. Small benign left adrenal adenoma. 5. Diffuse hepatic steatosis. 6. Chronic right maxillary sinusitis. 7. Mild prostatomegaly. 8. Aortic and coronary atherosclerosis. Aortic Atherosclerosis (ICD10-I70.0). Electronically Signed   By: Gaylyn Rong M.D.   On: 04/23/2022 09:19   MR Brain W Wo Contrast  Result Date: 04/20/2022 CLINICAL DATA:  Lung mass.  Evaluation for brain metastases. EXAM: MRI HEAD WITHOUT AND WITH CONTRAST TECHNIQUE: Multiplanar, multiecho pulse sequences of the brain and surrounding structures were obtained without and with intravenous contrast. CONTRAST:  8 mL Gadavist COMPARISON:  Head CT 06/17/2016 and MRI 04/26/2003 FINDINGS: Brain: There is no evidence of an acute infarct, intracranial hemorrhage, mass, midline shift, or extra-axial fluid collection. T2 hyperintensities in the cerebral white matter are new from the prior MRI and are nonspecific but compatible with mild chronic small vessel ischemic disease. Asymmetric iron deposition is noted in the right cerebral peduncle. Mild cerebral atrophy is within normal limits for age. No abnormal enhancement is identified. Vascular: Hypoplastic distal left vertebral artery. Other major intracranial vascular flow voids are preserved. Skull and upper cervical spine: Unremarkable bone marrow signal. Sinuses/Orbits: Bilateral cataract extraction. Remote medial right orbital fracture. Small mucous retention cyst  in the right maxillary sinus. Trace bilateral mastoid fluid. Other: None. IMPRESSION: 1. No evidence of intracranial metastases. 2. Mild chronic small vessel ischemic disease. Electronically Signed   By: Sebastian Ache M.D.   On: 04/20/2022 17:37   CT CHEST W CONTRAST  Result Date: 04/13/2022 CLINICAL DATA:  Right lung mass.  History prostate cancer. * Tracking Code: BO * EXAM: CT CHEST WITH CONTRAST TECHNIQUE: Multidetector CT imaging of the chest was performed during intravenous contrast administration. RADIATION DOSE REDUCTION: This exam was performed according to the departmental dose-optimization program which includes automated exposure control, adjustment of the mA and/or kV according to patient size and/or use of iterative reconstruction technique. CONTRAST:  75mL OMNIPAQUE IOHEXOL 300 MG/ML  SOLN COMPARISON:  Chest radiograph 04/10/2022 FINDINGS: Cardiovascular: Coronary, aortic arch, and branch vessel atherosclerotic vascular disease. Mediastinum/Nodes: Lower right paratracheal node 0.9 cm in short axis, image 59 series 2. Lungs/Pleura: A mass centered in the right upper lobe measures 9.2 by 5.7 by 6.6 cm, with lobulations appearing to extend across the  minor fissure into the right middle lobe, and across the major fissure into the right lower lobe. Interstitial accentuation along the margins of the mass likely from postobstructive pneumonitis. Potential satellite lesion in the right upper lobe measures 1.6 by 1.4 by 1.4 cm. Small calcified nodules in the right middle lobe and right lower lobe compatible with old granulomatous disease. Noncalcified right lower lobe nodule 0.4 cm in diameter on image 83 series 4. Centrilobular emphysema. Partially calcified 0.4 cm left lower lobe nodule adjacent to the diaphragm, probably a benign granuloma. Similar 2 mm nodule on image 119 series 4. Other small calcified granulomas in the left lung are present. No pleural effusion is identified. Upper Abdomen: Punctate  calcification in the liver compatible with old granulomatous disease. Faint capsular calcification along the posterolateral spleen, likely postinflammatory. 1.2 by 1.8 cm mass of the lateral limb left adrenal gland on image 151 series 2, previously shown to have a density of 3 Hounsfield units on noncontrast CT from 07/19/2021 compatible with adrenal adenoma. Otherwise minimal fullness of both adrenal glands without a mass worrisome for metastatic lesion. Abdominal aortic atherosclerosis noted. Musculoskeletal: The large right upper lobe mass abuts the lateral pleural margin, without evidence of chest wall invasion or bony destructive findings in the adjacent ribs. Small Morgagni hernia containing adipose tissue on image 106 series 6. IMPRESSION: 1. 9.2 by 5.7 by 6.6 cm right upper lobe mass with lobulations appearing to extend across the minor fissure into the right middle lobe, and across the major fissure into the right lower lobe. Potential satellite lesion in the right upper lobe measures 1.6 by 1.4 by 1.4 cm. Appearance highly likely to reflect lung cancer. No findings of chest wall invasion or metastatic disease to the upper abdomen. 2. Borderline prominent lower right paratracheal lymph node at 0.9 cm in short axis. 3. Old granulomatous disease. 4. Small left adrenal adenoma. 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 6. Aortic atherosclerosis. 7. Emphysema. 8. Noncalcified right lower lobe nodule 0.4 cm in diameter. Aortic Atherosclerosis (ICD10-I70.0) and Emphysema (ICD10-J43.9). Electronically Signed   By: Gaylyn Rong M.D.   On: 04/13/2022 15:02   DG Chest 2 View  Result Date: 04/10/2022 CLINICAL DATA:  Pulmonary disease, shortness of breath for 1 year, smoker, history prostate cancer EXAM: CHEST - 2 VIEW COMPARISON:  06/17/2016 FINDINGS: Normal heart size, mediastinal contours, and pulmonary vascularity. Atherosclerotic calcification aorta. Large area of opacity in mid RIGHT lung  either representing pneumonia or mass/neoplasm. Central peribronchial thickening. Remaining lungs clear. No pleural effusion, pneumothorax, or acute osseous findings. IMPRESSION: Bronchitic changes with large area of opacity in the mid RIGHT lung either representing pneumonia or tumor; either serial radiographic follow-up until resolution or CT chest with contrast recommended to exclude pulmonary neoplasm. These results will be called to the ordering clinician or representative by the Radiologist Assistant, and communication documented in the PACS or [employer]. Electronically Signed   By: Ulyses Southward M.D.   On: 04/10/2022 16:22

## 2022-05-01 ENCOUNTER — Telehealth: Payer: Self-pay | Admitting: Radiation Oncology

## 2022-05-01 ENCOUNTER — Inpatient Hospital Stay: Payer: Medicare Other | Admitting: Hematology

## 2022-05-01 ENCOUNTER — Other Ambulatory Visit: Payer: Self-pay | Admitting: *Deleted

## 2022-05-01 VITALS — BP 151/83 | HR 78 | Temp 98.3°F | Resp 17 | Ht 66.0 in | Wt 182.3 lb

## 2022-05-01 DIAGNOSIS — R918 Other nonspecific abnormal finding of lung field: Secondary | ICD-10-CM | POA: Diagnosis not present

## 2022-05-01 DIAGNOSIS — Z85828 Personal history of other malignant neoplasm of skin: Secondary | ICD-10-CM | POA: Diagnosis not present

## 2022-05-01 DIAGNOSIS — F17210 Nicotine dependence, cigarettes, uncomplicated: Secondary | ICD-10-CM | POA: Diagnosis not present

## 2022-05-01 DIAGNOSIS — C3491 Malignant neoplasm of unspecified part of right bronchus or lung: Secondary | ICD-10-CM | POA: Diagnosis not present

## 2022-05-01 LAB — CYTOLOGY - NON PAP

## 2022-05-01 MED ORDER — CLONAZEPAM 1 MG PO TBDP: ORAL_TABLET | ORAL | 2 refills | Status: DC

## 2022-05-01 NOTE — Telephone Encounter (Signed)
4/23 @ 3:06 pm Left voicemail for patient to call our office to be schedule for consult.

## 2022-05-01 NOTE — Patient Instructions (Addendum)
[facility] at [facility] Discharge Instructions   You were seen and examined today by Dr. Ellin Saba.  He reviewed the results of your PET scan which shows the known areas of cancer in your right lung. There is no evidence of cancer spread to any other parts of your body based on this scan.   He reviewed the results of your biopsy, which demonstrates a non-small cell lung cancer. Based on the cancer being in two different areas of the right lung, this is likely not able to be taken care of with surgery. Dr. Kirtland Bouchard will reach out to the surgeon (Dr. Dorris Fetch) to get his opinion regarding surgery.   If surgery is not an option, the next best treatment is to proceed with chemoradiation treatments. Chemo would be given in the clinic once a week. Radiations treatments are given 5 days a week, Monday-Friday. Treatment duration is about 6 weeks.   Return as scheduled.    Thank you for choosing [facility] at [facility] to provide your oncology and hematology care.  To afford each patient quality time with our provider, please arrive at least 15 minutes before your scheduled appointment time.   If you have a lab appointment with the Cancer Center please come in thru the Main Entrance and check in at the main information desk.  You need to re-schedule your appointment should you arrive 10 or more minutes late.  We strive to give you quality time with our providers, and arriving late affects you and other patients whose appointments are after yours.  Also, if you no show three or more times for appointments you may be dismissed from the clinic at the providers discretion.     Again, thank you for choosing [facility].  Our hope is that these requests will decrease the amount of time that you wait before being seen by our physicians.       _____________________________________________________________  Should you have questions after your visit to  [facility], please contact our office at [phone] and follow the prompts.  Our office hours are 8:00 a.m. and 4:30 p.m. Monday - Friday.  Please note that voicemails left after 4:00 p.m. may not be returned until the following business day.  We are closed weekends and major holidays.  You do have access to a nurse 24-7, just call the main number to the clinic [phone] and do not press any options, hold on the line and a nurse will answer the phone.    For prescription refill requests, have your pharmacy contact our office and allow 72 hours.    Due to Covid, you will need to wear a mask upon entering the hospital. If you do not have a mask, a mask will be given to you at the Main Entrance upon arrival. For doctor visits, patients may have 1 support person age 21 or older with them. For treatment visits, patients can not have anyone with them due to social distancing guidelines and our immunocompromised population.

## 2022-05-02 ENCOUNTER — Ambulatory Visit: Payer: Medicare Other | Admitting: Urology

## 2022-05-02 NOTE — Progress Notes (Signed)
Radiation Oncology         [phone] ________________________________  Name: Carlos [name]        MRN: [id number]  Date of Service: 05/04/2022 DOB: 10/20/1955  [name]:NUUV, Carlos Hazel, MD  Doreatha Massed, MD     REFERRING PHYSICIAN: Doreatha Massed, MD   DIAGNOSIS: The encounter diagnosis was Malignant neoplasm of right upper lobe of lung (HCC).   HISTORY OF PRESENT ILLNESS: Carlos [name] is a 67 y.o. male seen at the request of Dr. Ellin Saba. Patient originally presented with worsening shortness of breath. CXR on 04/10/22 showed a RML lung mass. Subsequent CT of the chest on 04/13/22 showed: a 9.2 x 5.7 x 6.6 cm RUL mass with lobulation appearing to extend across the minor fissure in the RML and RLL; a satellite lesion in the RUL measured 1.6 cm x 1.4 cm x 1.4 cm; a prominent lower right paratracheal lymph node at 0.9 cm; old granulomatous disease; a small left adrenal adenoma; small Morgagni hernia containing adipose tissue; aortic atherosclerosis; emphysema; and a noncalcified RLL nodule 0.4 cm in diameter.   Brain MRI on 04/19/22 showed no metastatic disease. PET scan on the same day showed a FDG avid 9.0 x 5.7 cm RUL mass and a 1.3 x 0.9 satellite nodule in the RUL. No findings of metastatic disease were seen.   Patient then underwent bronchoscopy and biopsy by Dr. Delton Coombes on 04/23/22. Pathology showed both RUL nodule FNA consistent with poorly differentiated carcinoma. 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  Patient smokes more than 1 pack/day for the last 50 years (started at age 26) quit smoking for 5 years before starting back again.   Patient met with Dr. Ellin Saba on 05/01/22 to discuss pathology results. Patient was discussed at tumor board on 05/03/22. It was deemed that he was not a good surgical candidate. Dr. Ellin Saba recommended concurrent chemoradiation followed by immunotherapy.   Today he presents to discuss  radiation therapy. He is present with his two supportive sisters. He states he experiences shortness of breath with activity and has a productive cough with clear sputum. He denies hemoptysis since the biopsy or chest pain.   PREVIOUS RADIATION THERAPY: No   PAST MEDICAL HISTORY:  Past Medical History:  Diagnosis Date   Anxiety    Cancer (HCC)    Skin cancer left shoulder   Chronic low back pain    Complication of anesthesia    Dermatofibrosarcoma protubera of right shoulder    Dyspnea    GERD (gastroesophageal reflux disease)    Hyperlipidemia    Lung cancer (HCC) 04/23/2022   PONV (postoperative nausea and vomiting)    Prostate cancer (HCC)        PAST SURGICAL HISTORY: Past Surgical History:  Procedure Laterality Date   APPENDECTOMY     APPLICATION OF A-CELL OF EXTREMITY Right 07/09/2012   Procedure: PLACEMENT OF A-CELL TO UPPER EXTREMITY/PLACEMENT OF VAC;  Surgeon: Wayland Denis, DO;  Location: [facility];  Service: Plastics;  Laterality: Right;   BRONCHIAL BRUSHINGS  04/23/2022   Procedure: BRONCHIAL BRUSHINGS;  Surgeon: Leslye Peer, MD;  Location: [facility];  Service: Pulmonary;;   BRONCHIAL NEEDLE ASPIRATION BIOPSY  04/23/2022   Procedure: BRONCHIAL NEEDLE ASPIRATION BIOPSIES;  Surgeon: Leslye Peer, MD;  Location: [facility];  Service: Pulmonary;;   COLONOSCOPY WITH PROPOFOL N/A 09/30/2015   Procedure: COLONOSCOPY WITH PROPOFOL;  Surgeon: Malissa Hippo, MD;  Location: [facility];  Service: Endoscopy;  Laterality: N/A;  830   EXCISION DERMATOFIBROCARCOMA PROTUBERAN RIGHT SHOULDER  2 - 3 WKS AGO   HEMOSTASIS CONTROL  04/23/2022   Procedure: HEMOSTASIS CONTROL;  Surgeon: Leslye Peer, MD;  Location: [facility] ENDOSCOPY;  Service: Pulmonary;;   INGUINAL HERNIA REPAIR Left 1999   IR IMAGING GUIDED PORT INSERTION  04/30/2022   KNEE ARTHROSCOPY Left 2012   POLYPECTOMY  09/30/2015   Procedure: POLYPECTOMY;  Surgeon: Malissa Hippo, MD;  Location: [facility];  Service: Endoscopy;;  sigmoid   VIDEO BRONCHOSCOPY WITH ENDOBRONCHIAL ULTRASOUND Bilateral 04/23/2022   Procedure: VIDEO BRONCHOSCOPY WITH ENDOBRONCHIAL ULTRASOUND;  Surgeon: Leslye Peer, MD;  Location: [facility] ENDOSCOPY;  Service: Pulmonary;  Laterality: Bilateral;   VIDEO BRONCHOSCOPY WITH RADIAL ENDOBRONCHIAL ULTRASOUND  04/23/2022   Procedure: VIDEO BRONCHOSCOPY WITH RADIAL ENDOBRONCHIAL ULTRASOUND;  Surgeon: Leslye Peer, MD;  Location: [facility];  Service: Pulmonary;;     FAMILY HISTORY:  Family History  Problem Relation Age of Onset   Dementia Mother    Cancer Father      SOCIAL HISTORY:  reports that he has been smoking cigarettes. He has a 40.00 pack-year smoking history. He has never used smokeless tobacco. He reports current alcohol use. He reports that he does not use drugs.   ALLERGIES: Ciprofloxacin and Gabapentin   MEDICATIONS:  Current Outpatient Medications  Medication Sig Dispense Refill   acetaminophen (TYLENOL) 500 MG tablet Take 1,000 mg by mouth every 6 (six) hours as needed (pain.).     aspirin EC 81 MG tablet Take 81 mg by mouth at bedtime. Swallow whole.     clonazePAM (KLONOPIN) 1 MG disintegrating tablet Take one tablet by mouth every morning and two tablets by mouth at bedtime 90 tablet 2   meloxicam (MOBIC) 15 MG tablet Take 15 mg by mouth in the morning.     pantoprazole (PROTONIX) 40 MG tablet Take 40 mg by mouth daily.     simvastatin (ZOCOR) 20 MG tablet Take 20 mg by mouth at bedtime.  2   tamsulosin (FLOMAX) 0.4 MG CAPS capsule Take 1 capsule (0.4 mg total) by mouth daily. (Patient taking differently: Take 0.4 mg by mouth at bedtime.) 30 capsule 11   No current facility-administered medications for this encounter.     REVIEW OF SYSTEMS: As per HPI     PHYSICAL EXAM:  Wt Readings from Last 3 Encounters:  05/04/22 184 lb 12.8 oz (83.8 kg)  05/01/22 182 lb 4.8 oz (82.7 kg)  04/30/22 179 lb 14.3 oz (81.6 kg)   Temp Readings  from Last 3 Encounters:  05/04/22 97.8 F (36.6 C)  05/01/22 98.3 F (36.8 C) (Oral)  04/30/22 97.8 F (36.6 C) (Oral)   BP Readings from Last 3 Encounters:  05/04/22 (!) 155/73  05/01/22 (!) 151/83  04/30/22 138/63   Pulse Readings from Last 3 Encounters:  05/04/22 93  05/01/22 78  04/30/22 (!) 0   Pain Assessment Pain Score: 4  Pain Loc: Back/10  In general this is a well appearing male in no acute distress. He's alert and oriented x4 and appropriate throughout the examination. Cardiopulmonary assessment is negative for acute distress and he exhibits normal effort.     ECOG = 1  0 - Asymptomatic (Fully active, able to carry on all predisease activities without restriction)  1 - Symptomatic but completely ambulatory (Restricted in physically strenuous activity but ambulatory and able to carry out work of a light or sedentary nature. For example, light housework, office  work)  2 - Symptomatic, <50% in bed during the day (Ambulatory and capable of all self care but unable to carry out any work activities. Up and about more than 50% of waking hours)  3 - Symptomatic, >50% in bed, but not bedbound (Capable of only limited self-care, confined to bed or chair 50% or more of waking hours)  4 - Bedbound (Completely disabled. Cannot carry on any self-care. Totally confined to bed or chair)  5 - Death   Santiago Glad MM, Creech RH, Tormey DC, et al. [phone]). "Toxicity and response criteria of the [facility], [facility], [facility]". Am. Evlyn Clines. Oncol. 5 (6): 649-55    LABORATORY DATA:  Lab Results  Component Value Date   WBC 9.1 04/29/2022   HGB 14.5 04/29/2022   HCT 42.1 04/29/2022   MCV 91.3 04/29/2022   PLT 324 04/29/2022   Lab Results  Component Value Date   NA 133 (L) 04/29/2022   K 4.1 04/29/2022   CL 99 04/29/2022   CO2 22 04/29/2022   Lab Results  Component Value Date   ALT 21 04/29/2022   AST 41 04/29/2022   ALKPHOS 74 04/29/2022   BILITOT 0.6 04/29/2022       RADIOGRAPHY: IR IMAGING GUIDED PORT INSERTION  Result Date: 04/30/2022 INDICATION: 67 year old male with right-sided lung cancer. He presents for placement of a port catheter to establish durable venous access. EXAM: IMPLANTED PORT A CATH PLACEMENT WITH ULTRASOUND AND FLUOROSCOPIC GUIDANCE MEDICATIONS: None. ANESTHESIA/SEDATION: Versed 3 mg IV; Fentanyl 75 mcg IV; Moderate Sedation Time:  16 minutes The patient's vital signs and level of consciousness were continuously monitored during the procedure by the interventional radiology nurse under my direct supervision. FLUOROSCOPY: Radiation exposure index: 1 mGy COMPLICATIONS: None immediate. PROCEDURE: The right neck and chest was prepped with chlorhexidine, and draped in the usual sterile fashion using maximum barrier technique (cap and mask, sterile gown, sterile gloves, large sterile sheet, hand hygiene and cutaneous antiseptic). Local anesthesia was attained by infiltration with 1% lidocaine with epinephrine. Ultrasound demonstrated patency of the left internal jugular vein, and this was documented with an image. Under real-time ultrasound guidance, this vein was accessed with a 21 gauge micropuncture needle and image documentation was performed. A small dermatotomy was made at the access site with an 11 scalpel. A 0.018" wire was advanced into the SVC and the access needle exchanged for a 81F micropuncture vascular sheath. The 0.018" wire was then removed and a 0.035" wire advanced into the IVC. An appropriate location for the subcutaneous reservoir was selected below the clavicle and an incision was made through the skin and underlying soft tissues. The subcutaneous tissues were then dissected using a combination of blunt and sharp surgical technique and a pocket was formed. A single lumen power injectable portacatheter was then tunneled through the subcutaneous tissues from the pocket to the dermatotomy and the port reservoir placed within the subcutaneous  pocket. The venous access site was then serially dilated and a peel away vascular sheath placed over the wire. The wire was removed and the port catheter advanced into position under fluoroscopic guidance. The catheter tip is positioned in the upper right atrium. This was documented with a spot image. The portacatheter was then tested and found to flush and aspirate well. The port was flushed with saline followed by 100 units/mL heparinized saline. The pocket was then closed in two layers using first subdermal inverted interrupted absorbable sutures followed by a running subcuticular suture. The epidermis was then sealed  with Dermabond. The dermatotomy at the venous access site was also closed with Dermabond. IMPRESSION: Successful placement of a left IJ approach Power Port with ultrasound and fluoroscopic guidance. The catheter is ready for use. Electronically Signed   By: Malachy Moan M.D.   On: 04/30/2022 16:13   CT Angio Chest PE W and/or Wo Contrast  Result Date: 04/29/2022 CLINICAL DATA:  Pulmonary embolism (PE) suspected, high prob. Hemoptysis, shortness of breath EXAM: CT ANGIOGRAPHY CHEST WITH CONTRAST TECHNIQUE: Multidetector CT imaging of the chest was performed using the standard protocol during bolus administration of intravenous contrast. Multiplanar CT image reconstructions and MIPs were obtained to evaluate the vascular anatomy. RADIATION DOSE REDUCTION: This exam was performed according to the departmental dose-optimization program which includes automated exposure control, adjustment of the mA and/or kV according to patient size and/or use of iterative reconstruction technique. CONTRAST:  75mL OMNIPAQUE IOHEXOL 350 MG/ML SOLN COMPARISON:  04/13/2022 FINDINGS: Cardiovascular: No filling defects in the pulmonary arteries to suggest pulmonary emboli. Heart is normal size. Aorta is normal caliber. Scattered aortic calcifications. Mediastinum/Nodes: No mediastinal, hilar, or axillary  adenopathy. Trachea and esophagus are unremarkable. Thyroid unremarkable. Lungs/Pleura: 9.1 cm right upper lobe mass is again noted as seen on prior study compatible with malignancy. Spiculated right upper lobe nodule measures 2.0 x 1.6 cm, similar to prior study, compatible with malignancy. No effusions. Left lung clear. Upper Abdomen: No acute findings Musculoskeletal: Chest wall soft tissues are unremarkable. No acute bony abnormality. Review of the MIP images confirms the above findings. IMPRESSION: No evidence of pulmonary embolus. Stable 9.1 cm right upper lobe mass hand spiculated 2 cm right upper lobe nodule. Findings compatible with malignancy. Aortic Atherosclerosis (ICD10-I70.0). Electronically Signed   By: Charlett Nose M.D.   On: 04/29/2022 23:27   DG Chest Port 1 View  Result Date: 04/23/2022 CLINICAL DATA:  S/P bronchoscopy with biopsy EXAM: PORTABLE CHEST 1 VIEW COMPARISON:  Chest x-ray April 10, 2022. FINDINGS: Mildly increased size of masslike consolidation in the right midlung. No visible pneumothorax on this semi erect radiograph. Left lung is clear. No visible pleural effusions. Cardiomediastinal silhouette is within normal limits. IMPRESSION: Mildly increased size of masslike consolidation in the right midlung. No visible pneumothorax on this semi erect radiograph. Electronically Signed   By: Feliberto Harts M.D.   On: 04/23/2022 11:19   DG C-ARM BRONCHOSCOPY  Result Date: 04/23/2022 C-ARM BRONCHOSCOPY: Fluoroscopy was utilized by the requesting physician.  No radiographic interpretation.   NM PET Image Initial (PI) Skull Base To Thigh  Result Date: 04/23/2022 CLINICAL DATA:  Initial treatment strategy for right lung mass. EXAM: NUCLEAR MEDICINE PET SKULL BASE TO THIGH TECHNIQUE: 9.8 mCi F-18 FDG was injected intravenously. Full-ring PET imaging was performed from the skull base to thigh after the radiotracer. CT data was obtained and used for attenuation correction and anatomic  localization. Fasting blood glucose: 95 mg/dl COMPARISON:  CT chest 16/10/9602 FINDINGS: Mediastinal blood pool activity: SUV max 2.8 Liver activity: SUV max NA NECK: No significant abnormal hypermetabolic activity in this region. Incidental CT findings: Chronic right maxillary sinusitis. Old right medial orbital wall fracture incidentally noted. CHEST: 9.0 by 5.7 cm mass centered in the right upper lobe has a maximum SUV of 11.8, compatible with malignancy. Satellite nodule measuring 1.3 by 0.9 cm in the right upper lobe on image 58 series 7 has maximum SUV of 3.8, compatible with malignancy. There is evidence of old granulomatous disease. Small nodules under 5 mm in size are noted  for example in the posterior basal segment left lower lobe, and are below sensitive PET-CT size thresholds. 0.7 cm right lower paratracheal lymph node on image 87 series 3 has maximum SUV of 2.8, equal to the blood pool. Minimal accentuated distal esophageal activity with maximum SUV of 3.9, probably physiologic. Incidental CT findings: Coronary, aortic arch, and branch vessel atherosclerotic vascular disease. ABDOMEN/PELVIS: No significant abnormal hypermetabolic activity in this region. Incidental CT findings: Diffuse hepatic steatosis. Atherosclerosis is present, including aortoiliac atherosclerotic disease. Old granulomatous disease involving the liver. Small benign left adrenal adenoma. Mild prostatomegaly. SKELETON: No significant abnormal hypermetabolic activity in this region. Incidental note made of activity along the left hand injection site. Incidental CT findings: No significant abnormal hypermetabolic activity in this region. IMPRESSION: 1. 9.0 by 5.7 cm right upper lobe mass has a maximum SUV of 11.8, compatible with malignancy. 2. 1.3 by 0.9 cm satellite nodule in the right upper lobe has a maximum SUV of 3.8, compatible with malignancy. 3. No findings of hypermetabolic adenopathy or distant metastatic disease. 4. Small  benign left adrenal adenoma. 5. Diffuse hepatic steatosis. 6. Chronic right maxillary sinusitis. 7. Mild prostatomegaly. 8. Aortic and coronary atherosclerosis. Aortic Atherosclerosis (ICD10-I70.0). Electronically Signed   By: Gaylyn Rong M.D.   On: 04/23/2022 09:19   MR Brain W Wo Contrast  Result Date: 04/20/2022 CLINICAL DATA:  Lung mass.  Evaluation for brain metastases. EXAM: MRI HEAD WITHOUT AND WITH CONTRAST TECHNIQUE: Multiplanar, multiecho pulse sequences of the brain and surrounding structures were obtained without and with intravenous contrast. CONTRAST:  8 mL Gadavist COMPARISON:  Head CT 06/17/2016 and MRI 04/26/2003 FINDINGS: Brain: There is no evidence of an acute infarct, intracranial hemorrhage, mass, midline shift, or extra-axial fluid collection. T2 hyperintensities in the cerebral white matter are new from the prior MRI and are nonspecific but compatible with mild chronic small vessel ischemic disease. Asymmetric iron deposition is noted in the right cerebral peduncle. Mild cerebral atrophy is within normal limits for age. No abnormal enhancement is identified. Vascular: Hypoplastic distal left vertebral artery. Other major intracranial vascular flow voids are preserved. Skull and upper cervical spine: Unremarkable bone marrow signal. Sinuses/Orbits: Bilateral cataract extraction. Remote medial right orbital fracture. Small mucous retention cyst in the right maxillary sinus. Trace bilateral mastoid fluid. Other: None. IMPRESSION: 1. No evidence of intracranial metastases. 2. Mild chronic small vessel ischemic disease. Electronically Signed   By: Sebastian Ache M.D.   On: 04/20/2022 17:37   CT CHEST W CONTRAST  Result Date: 04/13/2022 CLINICAL DATA:  Right lung mass.  History prostate cancer. * Tracking Code: BO * EXAM: CT CHEST WITH CONTRAST TECHNIQUE: Multidetector CT imaging of the chest was performed during intravenous contrast administration. RADIATION DOSE REDUCTION: This exam  was performed according to the departmental dose-optimization program which includes automated exposure control, adjustment of the mA and/or kV according to patient size and/or use of iterative reconstruction technique. CONTRAST:  75mL OMNIPAQUE IOHEXOL 300 MG/ML  SOLN COMPARISON:  Chest radiograph 04/10/2022 FINDINGS: Cardiovascular: Coronary, aortic arch, and branch vessel atherosclerotic vascular disease. Mediastinum/Nodes: Lower right paratracheal node 0.9 cm in short axis, image 59 series 2. Lungs/Pleura: A mass centered in the right upper lobe measures 9.2 by 5.7 by 6.6 cm, with lobulations appearing to extend across the minor fissure into the right middle lobe, and across the major fissure into the right lower lobe. Interstitial accentuation along the margins of the mass likely from postobstructive pneumonitis. Potential satellite lesion in the right upper lobe measures  1.6 by 1.4 by 1.4 cm. Small calcified nodules in the right middle lobe and right lower lobe compatible with old granulomatous disease. Noncalcified right lower lobe nodule 0.4 cm in diameter on image 83 series 4. Centrilobular emphysema. Partially calcified 0.4 cm left lower lobe nodule adjacent to the diaphragm, probably a benign granuloma. Similar 2 mm nodule on image 119 series 4. Other small calcified granulomas in the left lung are present. No pleural effusion is identified. Upper Abdomen: Punctate calcification in the liver compatible with old granulomatous disease. Faint capsular calcification along the posterolateral spleen, likely postinflammatory. 1.2 by 1.8 cm mass of the lateral limb left adrenal gland on image 151 series 2, previously shown to have a density of 3 Hounsfield units on noncontrast CT from 07/19/2021 compatible with adrenal adenoma. Otherwise minimal fullness of both adrenal glands without a mass worrisome for metastatic lesion. Abdominal aortic atherosclerosis noted. Musculoskeletal: The large right upper lobe mass  abuts the lateral pleural margin, without evidence of chest wall invasion or bony destructive findings in the adjacent ribs. Small Morgagni hernia containing adipose tissue on image 106 series 6. IMPRESSION: 1. 9.2 by 5.7 by 6.6 cm right upper lobe mass with lobulations appearing to extend across the minor fissure into the right middle lobe, and across the major fissure into the right lower lobe. Potential satellite lesion in the right upper lobe measures 1.6 by 1.4 by 1.4 cm. Appearance highly likely to reflect lung cancer. No findings of chest wall invasion or metastatic disease to the upper abdomen. 2. Borderline prominent lower right paratracheal lymph node at 0.9 cm in short axis. 3. Old granulomatous disease. 4. Small left adrenal adenoma. 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 6. Aortic atherosclerosis. 7. Emphysema. 8. Noncalcified right lower lobe nodule 0.4 cm in diameter. Aortic Atherosclerosis (ICD10-I70.0) and Emphysema (ICD10-J43.9). Electronically Signed   By: Gaylyn Rong M.D.   On: 04/13/2022 15:02   DG Chest 2 View  Result Date: 04/10/2022 CLINICAL DATA:  Pulmonary disease, shortness of breath for 1 year, smoker, history prostate cancer EXAM: CHEST - 2 VIEW COMPARISON:  06/17/2016 FINDINGS: Normal heart size, mediastinal contours, and pulmonary vascularity. Atherosclerotic calcification aorta. Large area of opacity in mid RIGHT lung either representing pneumonia or mass/neoplasm. Central peribronchial thickening. Remaining lungs clear. No pleural effusion, pneumothorax, or acute osseous findings. IMPRESSION: Bronchitic changes with large area of opacity in the mid RIGHT lung either representing pneumonia or tumor; either serial radiographic follow-up until resolution or CT chest with contrast recommended to exclude pulmonary neoplasm. These results will be called to the ordering clinician or representative by the Radiologist Assistant, and communication documented in the PACS  or [employer]. Electronically Signed   By: Ulyses Southward M.D.   On: 04/10/2022 16:22       IMPRESSION/PLAN: 1. Stage IIIa (T4, N0, M0) adenocarcinoma of the right lung:  Today, we discussed the pathology results and the nature of stage IIIa lung cancer. His case was discussed at tumor board and it was not thought that he would be a good surgical candidate. Dr. Mitzi Hansen is in agreement with plans for concurrent chemoradiation. First cycle of chemotherapy is scheduled for May 14th.    We discussed the risks, benefits, short, and long term effects of radiotherapy, as well as the curative intent, and the patient is interested in proceeding. Dr. Mitzi Hansen discussed the delivery and logistics of radiotherapy and anticipates a course of 6.5 weeks of radiotherapy to the right lung.  Patient has a good  understanding of the treatment plan and is enthusiastic about beginning treatment. All questions were answered. A consent form was signed and placed in patient's chart today. We will schedule CT simulation and plan to begin radiation treatment at the start of his first chemotherapy cycle.  In a visit lasting 45 minutes, greater than 50% of the time was spent face to face discussing the patient's condition, in preparation for the discussion, and coordinating the patient's care.   The above documentation reflects my direct findings during this shared patient visit. Please see the separate note by Dr. Mitzi Hansen on this date for the remainder of the patient's plan of care.    Joyice Faster, PA-C    **Disclaimer: This note was dictated with voice recognition software. Similar sounding words can inadvertently be transcribed and this note may contain transcription errors which may not have been corrected upon publication of note.**

## 2022-05-03 ENCOUNTER — Ambulatory Visit: Payer: Medicare Other | Admitting: Hematology

## 2022-05-03 ENCOUNTER — Other Ambulatory Visit: Payer: Self-pay

## 2022-05-03 ENCOUNTER — Telehealth: Payer: Self-pay

## 2022-05-03 NOTE — Progress Notes (Signed)
Thoracic Location of Tumor / Histology: RUL Lung  Patient presented worsening shortness of breath.  PET 04/19/2022: 9 x 5.7 cm right upper lobe mass.  1.3 x 0.9 cm satellite nodule in the right upper lobe.  No adenopathy or distant metastatic disease.  MRI Brain 04/19/2022: No metastatic disease.  CT Chest 04/13/2022: 9.2 x 5.7 x 6.6 cm right upper lobe mass with lobulations appearing to extend across the minor fissure into the right middle lobe and right lower lobe.  Satellite lesion in the right upper lobe measures 16. X 1.4 x 1.4 cm.  No findings of chest wall invasion. Borderline prominent lower right paratracheal lymph node 0.9 cm. Old granulomatous disease. Small left adrenal adenoma.    Biopsies of RUL Lung 04/23/2022     Tobacco/Marijuana/Snuff/ETOH use: Current Smoker  Past/Anticipated interventions by cardiothoracic surgery, if any:  Dr. Dorris Fetch -appointment unscheduled at this time.    Past/Anticipated interventions by medical oncology, if any:  Dr. Ellin Saba 05/01/2022 - We also discussed pathology report which was ruling out small cell carcinoma.  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 - He already has port placed. - I will reach out to Dr. Dorris Fetch.  If he is a potentially surgical candidate, we will do chemoimmunotherapy.  Otherwise he will be offered definitive chemoradiation therapy followed by consolidation immunotherapy. - We will order PFTs.  We will also refer to radiation oncology. -Follow-up 05/10/2022  Signs/Symptoms Weight changes, if any: Denies weight fluctuations. Respiratory complaints, if any: He reports increased SOB with activities. Hemoptysis, if any: He reports productive cough with clear sputum.  Denies hemoptysis since biopsy. Pain issues, if any: Denies chest pain, pressure, or tightness.  He reports lower back pain 4/10.   SAFETY ISSUES: Prior radiation? No Pacemaker/ICD?  No Possible current pregnancy?  N/a Is the patient on methotrexate? No  Current Complaints / other details:   -Port

## 2022-05-03 NOTE — Progress Notes (Signed)
The proposed treatment discussed in conference is for discussion purpose only and is not a binding recommendation.  The patients have not been physically examined, or presented with their treatment options.  Therefore, final treatment plans cannot be decided.  

## 2022-05-03 NOTE — Telephone Encounter (Signed)
     Patient  visit on 04/30/2022  at [facility] was for Hemoptysis.  Have you been able to follow up with your primary care physician? Patient followed up with Oncologist.  The patient was or was not able to obtain any needed medicine or equipment. No medication prescribed.  Are there diet recommendations that you are having difficulty following? No  Patient expresses understanding of discharge instructions and education provided has no other needs at this time. Yes   Mell Guia [facility]  [facility] Population Health Community Resource Care Guide   [email or web address]  ?? [phone]   Website: [email or web address]  [email or web address]

## 2022-05-04 ENCOUNTER — Ambulatory Visit
Admission: RE | Admit: 2022-05-04 | Discharge: 2022-05-04 | Disposition: A | Discharge status: Home / Self Care | Payer: Medicare Other | Source: Ambulatory Visit | Attending: Radiation Oncology | Admitting: Radiation Oncology

## 2022-05-04 ENCOUNTER — Encounter: Payer: Self-pay | Admitting: Hematology

## 2022-05-04 ENCOUNTER — Encounter: Payer: Self-pay | Admitting: Radiation Oncology

## 2022-05-04 ENCOUNTER — Other Ambulatory Visit: Payer: Self-pay | Admitting: Hematology

## 2022-05-04 VITALS — BP 155/73 | HR 93 | Temp 97.8°F | Resp 19 | Ht 66.0 in | Wt 184.8 lb

## 2022-05-04 DIAGNOSIS — G8929 Other chronic pain: Secondary | ICD-10-CM | POA: Insufficient documentation

## 2022-05-04 DIAGNOSIS — Z85828 Personal history of other malignant neoplasm of skin: Secondary | ICD-10-CM | POA: Insufficient documentation

## 2022-05-04 DIAGNOSIS — K219 Gastro-esophageal reflux disease without esophagitis: Secondary | ICD-10-CM | POA: Insufficient documentation

## 2022-05-04 DIAGNOSIS — I700 Atherosclerosis of aorta: Secondary | ICD-10-CM | POA: Diagnosis not present

## 2022-05-04 DIAGNOSIS — I2510 Atherosclerotic heart disease of native coronary artery without angina pectoris: Secondary | ICD-10-CM | POA: Insufficient documentation

## 2022-05-04 DIAGNOSIS — E785 Hyperlipidemia, unspecified: Secondary | ICD-10-CM | POA: Diagnosis not present

## 2022-05-04 DIAGNOSIS — N400 Enlarged prostate without lower urinary tract symptoms: Secondary | ICD-10-CM | POA: Insufficient documentation

## 2022-05-04 DIAGNOSIS — C3491 Malignant neoplasm of unspecified part of right bronchus or lung: Secondary | ICD-10-CM

## 2022-05-04 DIAGNOSIS — C3411 Malignant neoplasm of upper lobe, right bronchus or lung: Secondary | ICD-10-CM

## 2022-05-04 DIAGNOSIS — F17210 Nicotine dependence, cigarettes, uncomplicated: Secondary | ICD-10-CM | POA: Diagnosis not present

## 2022-05-04 DIAGNOSIS — K760 Fatty (change of) liver, not elsewhere classified: Secondary | ICD-10-CM | POA: Diagnosis not present

## 2022-05-04 DIAGNOSIS — Z7982 Long term (current) use of aspirin: Secondary | ICD-10-CM | POA: Diagnosis not present

## 2022-05-04 DIAGNOSIS — J320 Chronic maxillary sinusitis: Secondary | ICD-10-CM | POA: Diagnosis not present

## 2022-05-04 DIAGNOSIS — Z95828 Presence of other vascular implants and grafts: Secondary | ICD-10-CM

## 2022-05-04 DIAGNOSIS — Z79899 Other long term (current) drug therapy: Secondary | ICD-10-CM | POA: Diagnosis not present

## 2022-05-04 DIAGNOSIS — I6782 Cerebral ischemia: Secondary | ICD-10-CM | POA: Insufficient documentation

## 2022-05-04 HISTORY — DX: Malignant neoplasm of prostate: C61

## 2022-05-04 NOTE — Progress Notes (Signed)
START ON PATHWAY REGIMEN - Non-Small Cell Lung     A cycle is every 7 days, concurrent with RT:     Paclitaxel      Carboplatin   **Always confirm dose/schedule in your pharmacy ordering system**  Patient Characteristics: Preoperative or Nonsurgical Candidate (Clinical Staging), Stage III - Nonsurgical Candidate (Nonsquamous and Squamous), PS = 0, 1 Therapeutic Status: Preoperative or Nonsurgical Candidate (Clinical Staging) AJCC T Category: cT4 AJCC N Category: cN0 AJCC M Category: cM0 AJCC 8 Stage Grouping: IIIA ECOG Performance Status: 0 Intent of Therapy: Curative Intent, Discussed with Patient

## 2022-05-05 ENCOUNTER — Other Ambulatory Visit: Payer: Self-pay

## 2022-05-08 ENCOUNTER — Ambulatory Visit (HOSPITAL_COMMUNITY)
Admission: RE | Admit: 2022-05-08 | Discharge: 2022-05-08 | Disposition: A | Discharge status: Home / Self Care | Payer: Medicare Other | Source: Ambulatory Visit | Attending: Hematology | Admitting: Hematology

## 2022-05-08 DIAGNOSIS — R918 Other nonspecific abnormal finding of lung field: Secondary | ICD-10-CM

## 2022-05-08 LAB — PULMONARY FUNCTION TEST
DL/VA % pred: 63 %
DL/VA: 2.64 ml/min/mmHg/L
DLCO cor % pred: 50 %
DLCO cor: 11.57 ml/min/mmHg
DLCO unc % pred: 49 %
DLCO unc: 11.54 ml/min/mmHg
FEF 25-75 Pre: 0.86 L/sec
FEF2575-%Pred-Pre: 38 %
FEV1-%Pred-Pre: 67 %
FEV1-Pre: 1.93 L
FEV1FVC-%Pred-Pre: 88 %
FEV6-%Pred-Pre: 73 %
FEV6-Pre: 2.7 L
FEV6FVC-%Pred-Pre: 97 %
FVC-%Pred-Pre: 75 %
FVC-Pre: 2.93 L
Pre FEV1/FVC ratio: 66 %
Pre FEV6/FVC Ratio: 92 %
RV % pred: 236 %
RV: 5.12 L
TLC % pred: 128 %
TLC: 7.96 L

## 2022-05-10 ENCOUNTER — Inpatient Hospital Stay: Payer: Medicare Other

## 2022-05-10 ENCOUNTER — Inpatient Hospital Stay: Payer: Medicare Other | Admitting: Licensed Clinical Social Worker

## 2022-05-10 DIAGNOSIS — D3502 Benign neoplasm of left adrenal gland: Secondary | ICD-10-CM | POA: Insufficient documentation

## 2022-05-10 DIAGNOSIS — F32A Depression, unspecified: Secondary | ICD-10-CM | POA: Insufficient documentation

## 2022-05-10 DIAGNOSIS — F419 Anxiety disorder, unspecified: Secondary | ICD-10-CM | POA: Insufficient documentation

## 2022-05-10 DIAGNOSIS — C3411 Malignant neoplasm of upper lobe, right bronchus or lung: Secondary | ICD-10-CM | POA: Insufficient documentation

## 2022-05-10 DIAGNOSIS — Z85828 Personal history of other malignant neoplasm of skin: Secondary | ICD-10-CM | POA: Insufficient documentation

## 2022-05-10 DIAGNOSIS — Z5111 Encounter for antineoplastic chemotherapy: Secondary | ICD-10-CM | POA: Insufficient documentation

## 2022-05-10 DIAGNOSIS — Z8546 Personal history of malignant neoplasm of prostate: Secondary | ICD-10-CM | POA: Insufficient documentation

## 2022-05-10 DIAGNOSIS — J449 Chronic obstructive pulmonary disease, unspecified: Secondary | ICD-10-CM | POA: Insufficient documentation

## 2022-05-10 DIAGNOSIS — Z79899 Other long term (current) drug therapy: Secondary | ICD-10-CM | POA: Insufficient documentation

## 2022-05-10 DIAGNOSIS — Z801 Family history of malignant neoplasm of trachea, bronchus and lung: Secondary | ICD-10-CM | POA: Insufficient documentation

## 2022-05-10 DIAGNOSIS — F17210 Nicotine dependence, cigarettes, uncomplicated: Secondary | ICD-10-CM | POA: Insufficient documentation

## 2022-05-10 DIAGNOSIS — Z510 Encounter for antineoplastic radiation therapy: Secondary | ICD-10-CM | POA: Insufficient documentation

## 2022-05-10 DIAGNOSIS — C342 Malignant neoplasm of middle lobe, bronchus or lung: Secondary | ICD-10-CM | POA: Insufficient documentation

## 2022-05-10 DIAGNOSIS — C3491 Malignant neoplasm of unspecified part of right bronchus or lung: Secondary | ICD-10-CM

## 2022-05-10 NOTE — Progress Notes (Signed)
[facility] Clinical Social Work  Initial Assessment   Carlos [name] is a 67 y.o. year old male contacted by phone. Clinical Social Work was referred by medical provider for assessment of psychosocial needs.   SDOH (Social Determinants of Health) assessments performed: Yes SDOH Interventions    Flowsheet Row Office Visit from [facility]/18/20[facility] in [facility]  SDOH Interventions   Food Insecurity Interventions Intervention Not Indicated  Housing Interventions Intervention Not Indicated  Transportation Interventions Intervention Not Indicated  Utilities Interventions Intervention Not Indicated       SDOH Screenings   Food Insecurity: Food Insecurity Present (05/04/2022)  Housing: Low Risk  (05/04/2022)  Transportation Needs: No Transportation Needs (05/04/2022)  Utilities: Not At Risk (05/04/2022)  Depression (PHQ2-9): Low Risk  (05/04/2022)  Tobacco Use: High Risk (05/04/2022)     Distress Screen completed: No     No data to display            Family/Social Information:  Housing Arrangement: patient lives alone with his dog Family members/support persons in your life? Pt's sister was present for the call and informed CSW that pt has 2 brothers living close enough for pt to be able to see them from his home and pt's sister resides 3 minutes away.  Pt has additional siblings that live locally as well.  All are available to  provide support as needed. Transportation concerns: no  Employment: Retired from an [employer] where pt worked for 34 years.  Income source: Actor concerns: Yes, current concerns Type of concern: Utilities, Rent/ mortgage, and Medical bills Food access concerns: yes Religious or spiritual practice: Not known Services Currently in place:  none  Coping/ Adjustment to diagnosis: Patient understands treatment plan and what happens next? yes Concerns about diagnosis and/or treatment: Overwhelmed by information, How  I will pay for the services I need, How will I care for myself, and Quality of life Patient reported stressors: Finances, Anxiety/ nervousness, and Adjusting to my illness Hopes and/or priorities: Pt's priority is to start treatment w/ the hope of positive results Patient enjoys  not addressed Current coping skills/ strengths: Capable of independent living , Motivation for treatment/growth , Physical Health , and Supportive family/friends     SUMMARY: Current SDOH Barriers:  Financial constraints related to fixed income  Clinical Social Work Clinical Goal(s):  Explore community resource options for unmet needs related to:  Financial Strain   Interventions: Discussed common feeling and emotions when being diagnosed with cancer, and the importance of support during treatment Informed patient of the support team roles and support services at [facility] Provided CSW contact information and encouraged patient to call with any questions or concerns Referred patient to financial counselor to apply for the [employer], encouraged to apply for Medicaid and given instructions to do so.  Informed a nutrition consult has been placed which will assist with supplements.   Follow Up Plan: Patient will contact CSW with any support or resource needs Patient verbalizes understanding of plan: Yes    Carlos Moulds, LCSW Clinical Social Worker [facility]

## 2022-05-14 NOTE — Patient Instructions (Addendum)
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 You will be treated weekly in the clinic in conjunction with radiation therapy. You will receive two chemotherapy drugs in the clinic weekly. Those drugs are paclitaxel (Taxol) and carboplatin. The intent of treatment is cure. You will see the doctor regularly throughout treatment.  We will obtain blood work from you prior to every treatment and monitor your results to make sure it is safe to give your treatment. The doctor monitors your response to treatment by the way you are feeling, your blood work, and by obtaining scans periodically. There will be wait times while you are here for treatment.  It will take about 30 minutes to 1 hour for your lab work to result. Then there will be wait times while pharmacy mixes your medications.    Medications you will receive in the clinic prior to your chemotherapy medications:   Aloxi:  ALOXI is used in adults to help prevent the nausea and vomiting that happens with certain chemotherapy drugs.  Aloxi is a long acting medication, and will remain in your system for about 2 days.    Dexamethasone:  This is a steroid given prior to chemotherapy to help prevent allergic reactions; it may also help prevent and control nausea and diarrhea.    Pepcid:  This medication is a histamine blocker that helps prevent and allergic reaction to your chemotherapy.    Benadryl:  This is a histamine blocker (different from the Pepcid) that helps prevent allergic/infusion reactions to your chemotherapy. This medication may cause dizziness/drowsiness.     Paclitaxel (Taxol)  About This Drug Paclitaxel is a drug used to treat cancer. It is given in the vein (IV).  This will take 1 hour to infuse.  This first infusion will take longer to infuse because it is increased slowly to monitor for reactions.  The nurse will be in the room with you for the first  15 minutes of the first infusion.  Possible Side Effects   Hair loss. Hair loss is often temporary, although with certain medicine, hair loss can sometimes be permanent. Hair loss may happen suddenly or gradually. If you lose hair, you may lose it from your head, face, armpits, pubic area, chest, and/or legs. You may also notice your hair getting thin.   Swelling of your legs, ankles and/or feet (edema)   Flushing   Nausea and throwing up (vomiting)   Loose bowel movements (diarrhea)   Bone marrow depression. This is a decrease in the number of white blood cells, red blood cells, and platelets. This may raise your risk of infection, make you tired and weak (fatigue), and raise your risk of bleeding.   Effects on the nerves are called peripheral neuropathy. You may feel numbness, tingling, or pain in your hands and feet. It may be hard for you to button your clothes, open jars, or walk as usual. The effect on the nerves may get worse with more doses of the drug. These effects get better in some people after the drug is stopped but it does not get better in all people.   Changes in your liver function   Bone, joint and muscle pain   Abnormal EKG   Allergic reaction: Allergic reactions, including anaphylaxis are rare but may happen in some patients. Signs of allergic reaction to this drug may be swelling of the face, feeling like your tongue or throat are swelling, trouble  breathing, rash, itching, fever, chills, feeling dizzy, and/or feeling that your heart is beating in a fast or not normal way. If this happens, do not take another dose of this drug. You should get urgent medical treatment.   Infection   Changes in your kidney function.  Note: Each of the side effects above was reported in 20% or greater of patients treated with paclitaxel. Not all possible side effects are included above.  Warnings and Precautions   Severe allergic reactions   Severe bone marrow  depression  Treating Side Effects   To help with hair loss, wash with a mild shampoo and avoid washing your hair every day.   Avoid rubbing your scalp, instead, pat your hair or scalp dry   Avoid coloring your hair   Limit your use of hair spray, electric curlers, blow dryers, and curling irons.   If you are interested in getting a wig, talk to your nurse. You can also call the [employer] at 800-ACS-2345 to find out information about the "Look Good, Feel Better" program close to where you live. It is a free program where women getting chemotherapy can learn about wigs, turbans and scarves as well as makeup techniques and skin and nail care.   Ask your doctor or nurse about medicines that are available to help stop or lessen diarrhea and/or nausea.   To help with nausea and vomiting, eat small, frequent meals instead of three large meals a day. Choose foods and drinks that are at room temperature. Ask your nurse or doctor about other helpful tips and medicine that is available to help or stop lessen these symptoms.   If you get diarrhea, eat low-fiber foods that are high in protein and calories and avoid foods that can irritate your digestive tracts or lead to cramping. Ask your nurse or doctor about medicine that can lessen or stop your diarrhea.   Mouth care is very important. Your mouth care should consist of routine, gentle cleaning of your teeth or dentures and rinsing your mouth with a mixture of 1/2 teaspoon of salt in 8 ounces of water or  teaspoon of baking soda in 8 ounces of water. This should be done at least after each meal and at bedtime.   If you have mouth sores, avoid mouthwash that has alcohol. Also avoid alcohol and smoking because they can bother your mouth and throat.   Drink plenty of fluids (a minimum of eight glasses per day is recommended).   Take your temperature as your doctor or nurse tells you, and whenever you feel like you may have a fever.    Talk to your doctor or nurse about precautions you can take to avoid infections and bleeding.   Be careful when cooking, walking, and handling sharp objects and hot liquids.  Food and Drug Interactions   There are no known interactions of paclitaxel with food.   This drug may interact with other medicines. Tell your doctor and pharmacist about all the medicines and dietary supplements (vitamins, minerals, herbs and others) that you are taking at this time.   The safety and use of dietary supplements and alternative diets are often not known. Using these might affect your cancer or interfere with your treatment. Until more is known, you should not use dietary supplements or alternative diets without your cancer doctor's help.  When to Call the Doctor  Call your doctor or nurse if you have any of the following symptoms and/or any new or  unusual symptoms:   Fever of 100.4 F (38 C) or above   Chills   Redness, pain, warmth, or swelling at the IV site during the infusion   Signs of allergic reaction: swelling of the face, feeling like your tongue or throat are swelling, trouble breathing, rash, itching, fever, chills, feeling dizzy, and/or feeling that your heart is beating in a fast or not normal way   Feeling that your heart is beating in a fast or not normal way (palpitations)   Weight gain of 5 pounds in one week (fluid retention)   Decreased urine or very dark urine   Signs of liver problems: dark urine, pale bowel movements, bad stomach pain, feeling very tired and weak, unusual  itching, or yellowing of the eyes or skin   Heavy menstrual period that lasts longer than normal   Easy bruising or bleeding   Nausea that stops you from eating or drinking, and/or that is not relieved by prescribed medicines.   Loose bowel movements (diarrhea) more than 4 times a day or diarrhea with weakness or lightheadedness   Pain in your mouth or throat that makes it hard to eat or drink    Lasting loss of appetite or rapid weight loss of five pounds in a week   Signs of peripheral neuropathy: numbness, tingling, or decreased feeling in fingers or toes; trouble walking or changes in the way you walk; or feeling clumsy when buttoning clothes, opening jars, or other routine activities   Joint and muscle pain that is not relieved by prescribed medicines   Extreme fatigue that interferes with normal activities   While you are getting this drug, please tell your nurse right away if you have any pain, redness, or swelling at the site of the IV infusion.   If you think you are pregnant.  Reproduction Warnings   Pregnancy warning: This drug may have harmful effects on the unborn child, it is recommended that effective methods of birth control should be used during your cancer treatment. Let your doctor know right away if you think you may be pregnant.   Breast feeding warning: Women should not breast feed during treatment because this drug could enter the breastmilk and cause harm to a breast feeding baby.   Carboplatin (Paraplatin, CBDCA)  About This Drug  Carboplatin is used to treat cancer. It is given in the vein (IV).  It will take 30 minutes to infuse.   Possible Side Effects   Bone marrow suppression. This is a decrease in the number of white blood cells, red blood cells, and platelets. This may raise your risk of infection, make you tired and weak (fatigue), and raise your risk of bleeding.   Nausea and vomiting (throwing up)   Weakness   Changes in your liver function   Changes in your kidney function   Electrolyte changes   Pain  Note: Each of the side effects above was reported in 20% or greater of patients treated with carboplatin. Not all possible side effects are included above.   Warnings and Precautions   Severe bone marrow suppression   Allergic reactions, including anaphylaxis are rare but may happen in some patients. Signs of allergic reaction  to this drug may be swelling of the face, feeling like your tongue or throat are swelling, trouble breathing, rash, itching, fever, chills, feeling dizzy, and/or feeling that your heart is beating in a fast or not normal way. If this happens, do not take another dose of this  drug. You should get urgent medical treatment.   Severe nausea and vomiting   Effects on the nerves are called peripheral neuropathy. This risk is increased if you are over the age of 84 or if you have received other medicine with risk of peripheral neuropathy. You may feel numbness, tingling, or pain in your hands and feet. It may be hard for you to button your clothes, open jars, or walk as usual. The effect on the nerves may get worse with more doses of the drug. These effects get better in some people after the drug is stopped but it does not get better in all people.   Blurred vision, loss of vision or other changes in eyesight   Decreased hearing   - Skin and tissue irritation including redness, pain, warmth, or swelling at the IV site if the drug leaks out of the vein and into nearby tissue.   Severe changes in your kidney function, which can cause kidney failure   Severe changes in your liver function, which can cause liver failure  Note: Some of the side effects above are very rare. If you have concerns and/or questions, please discuss them with your medical team.   Important Information   This drug may be present in the saliva, tears, sweat, urine, stool, vomit, semen, and vaginal secretions. Talk to your doctor and/or your nurse about the necessary precautions to take during this time.   Treating Side Effects   Manage tiredness by pacing your activities for the day.   Be sure to include periods of rest between energy-draining activities.   To decrease the risk of infection, wash your hands regularly.   Avoid close contact with people who have a cold, the flu, or other infections.   Take your  temperature as your doctor or nurse tells you, and whenever you feel like you may have a fever.   To help decrease the risk of bleeding, use a soft toothbrush. Check with your nurse before using dental floss.   Be very careful when using knives or tools.   Use an electric shaver instead of a razor.   Drink plenty of fluids (a minimum of eight glasses per day is recommended).   If you throw up or have loose bowel movements, you should drink more fluids so that you do not become dehydrated (lack of water in the body from losing too much fluid).   To help with nausea and vomiting, eat small, frequent meals instead of three large meals a day. Choose foods and drinks that are at room temperature. Ask your nurse or doctor about other helpful tips and medicine that is available to help stop or lessen these symptoms.   If you have numbness and tingling in your hands and feet, be careful when cooking, walking, and handling sharp objects and hot liquids.   Keeping your pain under control is important to your well-being. Please tell your doctor or nurse if you are experiencing pain.   Food and Drug Interactions   There are no known interactions of carboplatin with food.   This drug may interact with other medicines. Tell your doctor and pharmacist about all the prescription and over-the-counter medicines and dietary supplements (vitamins, minerals, herbs and others) that you are taking at this time. Also, check with your doctor or pharmacist before starting any new prescription or over-the-counter medicines, or dietary supplements to make sure that there are no interactions.   When to Call the Doctor  Call your doctor  or nurse if you have any of these symptoms and/or any new or unusual symptoms:   Fever of 100.4 F (38 C) or higher   Chills   Tiredness that interferes with your daily activities   Feeling dizzy or lightheaded   Easy bleeding or bruising   Nausea that stops you from  eating or drinking and/or is not relieved by prescribed medicines   Throwing up   Blurred vision or other changes in eyesight   Decrease in hearing or ringing in the ear   Signs of allergic reaction: swelling of the face, feeling like your tongue or throat are swelling, trouble breathing, rash, itching, fever, chills, feeling dizzy, and/or feeling that your heart is beating in a fast or not normal way. If this happens, call 911 for emergency care.   Signs of possible liver problems: dark urine, pale bowel movements, bad stomach pain, feeling very tired and weak, unusual itching, or yellowing of the eyes or skin   Decreased urine, or very dark urine   Numbness, tingling, or pain in your hands and feet   Pain that does not go away or is not relieved by prescribed medicine   While you are getting this drug, please tell your nurse right away if you have any pain, redness, or swelling at the site of the IV infusion, or if you have any new onset of symptoms, or if you just feel "different" from before when the infusion was started.   Reproduction Warnings   Pregnancy warning: This drug may have harmful effects on the unborn baby. Women of child bearing potential should use effective methods of birth control during your cancer treatment. Let your doctor know right away if you think you may be pregnant.   Breastfeeding warning: It is not known if this drug passes into breast milk. For this reason, women should not breastfeed during treatment because this drug could enter the breast milk and cause harm to a breastfeeding baby.   Fertility warning: Human fertility studies have not been done with this drug. Talk with your doctor or nurse if you plan to have children. Ask for information on sperm or egg banking.    SELF CARE ACTIVITIES WHILE ON CHEMOTHERAPY/IMMUNOTHERAPY:  Hydration Increase your fluid intake and drink at least 64 ounces (2 liters) of water/decaffeinated beverages per day  after treatment. You can still have your cup of coffee or soda but these beverages do not count as part of the 64 ounces that you need to drink daily. Limit alcohol intake.  Medications Continue taking your normal prescription medication as prescribed.  If you start any new herbal or new supplements please let us know first to make sure it is safe.  Mouth Care Have teeth cleaned professionally before starting treatment. Keep dentures and partial plates clean. Use soft toothbrush and do not use mouthwashes that contain alcohol. Biotene is a good mouthwash that is available at most pharmacies or may be ordered by calling [phone]. Use warm salt water gargles (1 teaspoon salt per 1 quart warm water) before and after meals and at bedtime. If you are still having problems with your mouth or sores in your mouth please call the clinic. If you need dental work, please let the doctor know before you go for your appointment so that we can coordinate the best possible time for you in regards to your chemo regimen. You need to also let your dentist know that you are actively taking chemo. We may need to  do labs prior to your dental appointment.  Skin Care Always use sunscreen that has not expired and with SPF (Sun Protection Factor) of 50 or higher. Wear hats to protect your head from the sun. Remember to use sunscreen on your hands, ears, face, & feet.  Use good moisturizing lotions such as udder cream, eucerin, or even Vaseline. Some chemotherapies can cause dry skin, color changes in your skin and nails.    Avoid long, hot showers or baths. Use gentle, fragrance-free soaps and laundry detergent. Use moisturizers, preferably creams or ointments rather than lotions because the thicker consistency is better at preventing skin dehydration. Apply the cream or ointment within 15 minutes of showering. Reapply moisturizer at night, and moisturize your hands every time after you wash them.   Infection  Prevention Please wash your hands for at least 30 seconds using warm soapy water. Handwashing is the #1 way to prevent the spread of germs. Stay away from sick people or people who are getting over a cold. If you develop respiratory systems such as green/yellow mucus production or productive cough or persistent cough let us know and we will see if you need an antibiotic. It is a good idea to keep a pair of gloves on when going into grocery stores/[employer] to decrease your risk of coming into contact with germs on the carts, etc. Carry alcohol hand gel with you at all times and use it frequently if out in public. If your temperature reaches 100.5 or higher please call the clinic and let us know.  If it is after hours or on the weekend please go to the ER if your temperature is over 100.4.  Please have your own personal thermometer at home to use.    Sex and bodily fluids If you are going to have sex, a condom must be used to protect the person that isn't taking immunotherapy. For a few days after treatment, immunotherapy can be excreted through your bodily fluids.  When using the toilet please close the lid and flush the toilet twice.  Do this for a few day after you have had immunotherapy.   Contraception It is not known for sure whether or not immunotherapy drugs can be passed on through semen or secretions from the vagina. Because of this some doctors advise people to use a barrier method if you have sex during treatment. This applies to vaginal, anal or oral sex.  Generally, doctors advise a barrier method only for the time you are actually having the treatment and for about a week after your treatment.  Advice like this can be worrying, but this does not mean that you have to avoid being intimate with your partner. You can still have close contact with your partner and continue to enjoy sex.  Animals If you have cats or birds we ask that you not change the litter or change the cage.  Please have  someone else do this for you while you are on immunotherapy.   Food Safety During and After Cancer Treatment Food safety is important for people both during and after cancer treatment. Cancer and cancer treatments, such as chemotherapy, radiation therapy, and stem cell/bone marrow transplantation, often weaken the immune system. This makes it harder for your body to protect itself from foodborne illness, also called food poisoning. Foodborne illness is caused by eating food that contains harmful bacteria, parasites, or viruses.  Foods to avoid Some foods have a higher risk of becoming tainted with bacteria. These include: Unwashed  fresh fruit and vegetables, especially leafy vegetables that can hide dirt and other contaminants Raw sprouts, such as alfalfa sprouts Raw or undercooked beef, especially ground beef, or other raw or undercooked meat and poultry Fatty, fried, or spicy foods immediately before or after treatment.  These can sit heavy on your stomach and make you feel nauseous. Raw or undercooked shellfish, such as oysters. Sushi and sashimi, which often contain raw fish.  Unpasteurized beverages, such as unpasteurized fruit juices, raw milk, raw yogurt, or cider Undercooked eggs, such as soft boiled, over easy, and poached; raw, unpasteurized eggs; or foods made with raw egg, such as homemade raw cookie dough and homemade mayonnaise  Simple steps for food safety  Shop smart. Do not buy food stored or displayed in an unclean area. Do not buy bruised or damaged fruits or vegetables. Do not buy cans that have cracks, dents, or bulges. Pick up foods that can spoil at the end of your shopping trip and store them in a cooler on the way home.  Prepare and clean up foods carefully. Rinse all fresh fruits and vegetables under running water, and dry them with a clean towel or paper towel. Clean the top of cans before opening them. After preparing food, wash your hands for 20 seconds with  hot water and soap. Pay special attention to areas between fingers and under nails. Clean your utensils and dishes with hot water and soap. Disinfect your kitchen and cutting boards using 1 teaspoon of liquid, unscented bleach mixed into 1 quart of water.    Dispose of old food. Eat canned and packaged food before its expiration date (the "use by" or "best before" date). Consume refrigerated leftovers within 3 to 4 days. After that time, throw out the food. Even if the food does not smell or look spoiled, it still may be unsafe. Some bacteria, such as Listeria, can grow even on foods stored in the refrigerator if they are kept for too long.  Take precautions when eating out. At restaurants, avoid buffets and salad bars where food sits out for a long time and comes in contact with many people. Food can become contaminated when someone with a virus, often a norovirus, or another "bug" handles it. Put any leftover food in a "to-go" container yourself, rather than having the server do it. And, refrigerate leftovers as soon as you get home. Choose restaurants that are clean and that are willing to prepare your food as you order it cooked.    SYMPTOMS TO REPORT AS SOON AS POSSIBLE AFTER TREATMENT:  FEVER GREATER THAN 100.4 F CHILLS WITH OR WITHOUT FEVER NAUSEA AND VOMITING THAT IS NOT CONTROLLED WITH YOUR NAUSEA MEDICATION UNUSUAL SHORTNESS OF BREATH UNUSUAL BRUISING OR BLEEDING TENDERNESS IN MOUTH AND THROAT WITH OR WITHOUT PRESENCE OF ULCERS URINARY PROBLEMS BOWEL PROBLEMS UNUSUAL RASH     Wear comfortable clothing and clothing appropriate for easy access to any Portacath or PICC line. Let us know if there is anything that we can do to make your therapy better!   What to do if you need assistance after hours or on the weekends: CALL [phone].  HOLD on the line, do not hang up.  You will hear multiple messages but at the end you will be connected with a nurse triage line.  They will  contact the doctor if necessary.  Most of the time they will be able to assist you.  Do not call the hospital operator.    I have been  informed and understand all of the instructions given to me and have received a copy. I have been instructed to call the clinic [phone] or my family physician as soon as possible for continued medical care, if indicated. I do not have any more questions at this time but understand that I may call the Cancer Center or the Patient Navigator at [phone] during office hours should I have questions or need assistance in obtaining follow-up care.

## 2022-05-15 ENCOUNTER — Ambulatory Visit: Payer: Medicare Other

## 2022-05-15 ENCOUNTER — Ambulatory Visit
Admission: RE | Admit: 2022-05-15 | Discharge: 2022-05-15 | Disposition: A | Discharge status: Home / Self Care | Payer: Medicare Other | Source: Ambulatory Visit | Attending: Radiation Oncology | Admitting: Radiation Oncology

## 2022-05-15 ENCOUNTER — Ambulatory Visit: Payer: Medicare Other | Admitting: Hematology

## 2022-05-15 ENCOUNTER — Other Ambulatory Visit: Payer: Medicare Other

## 2022-05-15 DIAGNOSIS — F32A Depression, unspecified: Secondary | ICD-10-CM | POA: Diagnosis not present

## 2022-05-15 DIAGNOSIS — D3502 Benign neoplasm of left adrenal gland: Secondary | ICD-10-CM | POA: Diagnosis not present

## 2022-05-15 DIAGNOSIS — C3411 Malignant neoplasm of upper lobe, right bronchus or lung: Secondary | ICD-10-CM | POA: Diagnosis not present

## 2022-05-15 DIAGNOSIS — J449 Chronic obstructive pulmonary disease, unspecified: Secondary | ICD-10-CM | POA: Diagnosis not present

## 2022-05-15 DIAGNOSIS — F419 Anxiety disorder, unspecified: Secondary | ICD-10-CM | POA: Diagnosis not present

## 2022-05-15 DIAGNOSIS — Z79899 Other long term (current) drug therapy: Secondary | ICD-10-CM | POA: Diagnosis not present

## 2022-05-15 DIAGNOSIS — C342 Malignant neoplasm of middle lobe, bronchus or lung: Secondary | ICD-10-CM | POA: Diagnosis not present

## 2022-05-15 DIAGNOSIS — Z801 Family history of malignant neoplasm of trachea, bronchus and lung: Secondary | ICD-10-CM | POA: Diagnosis not present

## 2022-05-15 DIAGNOSIS — Z510 Encounter for antineoplastic radiation therapy: Secondary | ICD-10-CM | POA: Diagnosis not present

## 2022-05-15 DIAGNOSIS — F17210 Nicotine dependence, cigarettes, uncomplicated: Secondary | ICD-10-CM | POA: Diagnosis not present

## 2022-05-15 DIAGNOSIS — Z5111 Encounter for antineoplastic chemotherapy: Secondary | ICD-10-CM | POA: Diagnosis not present

## 2022-05-15 DIAGNOSIS — Z8546 Personal history of malignant neoplasm of prostate: Secondary | ICD-10-CM | POA: Diagnosis not present

## 2022-05-15 DIAGNOSIS — Z85828 Personal history of other malignant neoplasm of skin: Secondary | ICD-10-CM | POA: Diagnosis not present

## 2022-05-16 ENCOUNTER — Inpatient Hospital Stay: Payer: Medicare Other

## 2022-05-17 ENCOUNTER — Inpatient Hospital Stay: Payer: Medicare Other

## 2022-05-17 ENCOUNTER — Encounter: Payer: Self-pay | Admitting: Hematology

## 2022-05-17 DIAGNOSIS — C3491 Malignant neoplasm of unspecified part of right bronchus or lung: Secondary | ICD-10-CM

## 2022-05-17 DIAGNOSIS — Z95828 Presence of other vascular implants and grafts: Secondary | ICD-10-CM | POA: Insufficient documentation

## 2022-05-17 HISTORY — DX: Presence of other vascular implants and grafts: Z95.828

## 2022-05-17 MED ORDER — PROCHLORPERAZINE MALEATE 10 MG PO TABS
10.0000 mg | ORAL_TABLET | Freq: Four times a day (QID) | ORAL | 3 refills | Status: DC | PRN
Start: 2022-05-17 — End: 2022-05-22

## 2022-05-17 MED ORDER — LIDOCAINE-PRILOCAINE 2.5-2.5 % EX CREA
TOPICAL_CREAM | CUTANEOUS | 3 refills | Status: DC
Start: 2022-05-17 — End: 2022-05-22

## 2022-05-17 NOTE — Progress Notes (Signed)

## 2022-05-19 DIAGNOSIS — F17210 Nicotine dependence, cigarettes, uncomplicated: Secondary | ICD-10-CM | POA: Diagnosis not present

## 2022-05-19 DIAGNOSIS — C3411 Malignant neoplasm of upper lobe, right bronchus or lung: Secondary | ICD-10-CM | POA: Diagnosis not present

## 2022-05-19 DIAGNOSIS — Z510 Encounter for antineoplastic radiation therapy: Secondary | ICD-10-CM | POA: Diagnosis not present

## 2022-05-19 DIAGNOSIS — J449 Chronic obstructive pulmonary disease, unspecified: Secondary | ICD-10-CM | POA: Diagnosis not present

## 2022-05-19 DIAGNOSIS — Z85828 Personal history of other malignant neoplasm of skin: Secondary | ICD-10-CM | POA: Diagnosis not present

## 2022-05-19 DIAGNOSIS — C342 Malignant neoplasm of middle lobe, bronchus or lung: Secondary | ICD-10-CM | POA: Diagnosis not present

## 2022-05-19 DIAGNOSIS — F32A Depression, unspecified: Secondary | ICD-10-CM | POA: Diagnosis not present

## 2022-05-19 DIAGNOSIS — Z5111 Encounter for antineoplastic chemotherapy: Secondary | ICD-10-CM | POA: Diagnosis not present

## 2022-05-19 DIAGNOSIS — D3502 Benign neoplasm of left adrenal gland: Secondary | ICD-10-CM | POA: Diagnosis not present

## 2022-05-19 DIAGNOSIS — Z801 Family history of malignant neoplasm of trachea, bronchus and lung: Secondary | ICD-10-CM | POA: Diagnosis not present

## 2022-05-19 DIAGNOSIS — Z79899 Other long term (current) drug therapy: Secondary | ICD-10-CM | POA: Diagnosis not present

## 2022-05-21 NOTE — Progress Notes (Signed)
[facility] [address] [place] [address]    Clinic Day:  05/22/2022  Referring physician: Benita Stabile, MD  Patient Care Team: Benita Stabile, MD as PCP - General (Internal Medicine) Doreatha Massed, MD as Medical Oncologist (Medical Oncology) Therese Sarah, RN as Oncology Nurse Navigator (Medical Oncology)   ASSESSMENT & PLAN:   Assessment: 1.  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  No hemoptysis/weight loss. - Chest x-ray (04/10/2022): Mid right lung mass. - CT chest (04/13/2022): 9.2 x 5.7 x 6.6 cm right upper lobe mass with lobulations appearing to extend across the minor fissure into the right middle lobe and right lower lobe.  Satellite lesion in the right upper lobe measures 1.6 x 1.4 x 1.4 cm.  No findings of chest wall invasion.  Borderline prominent lower right paratracheal lymph node 0.9 cm.  Old granulomatous disease.  Small left adrenal adenoma. - Has right posterior chest wall pain for the past 6 months, dull type.  He is not requiring any pain medication. - Brain MRI (04/19/2022): No metastatic disease. - PET scan (04/19/2022): 9 x 5.7 cm right upper lobe mass with SUV 11.8.  1.3 x 0.9 cm satellite nodule in the right upper lobe with SUV 3.8.  No adenopathy or distant metastatic disease. - Bronchoscopy and biopsy by Dr. Delton Coombes - Pathology (04/23/2022): Both right upper lobe lung nodule FNA consistent with poorly differentiated carcinoma.  IHC diffusely positive for TTF-1, Napsin, synaptophysin and CK7.  CD56 negative.  P63 and CK5/6 negative.  Given Napsin positivity, favor adenocarcinoma.  There is also diffuse synaptophysin positivity consistent with neuroendocrine differentiation.  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 - Discussed with Dr. Dorris Fetch.  Not a surgical candidate as he requires pneumonectomy and has  suboptimal DLCO. - Chemoradiation with carboplatin and paclitaxel started on 05/22/2022   2.  Social/family history: - He is seen with his 2 sisters today.  Lives by himself and is independent of ADLs and IADLs.  He retired after working at [employer] for the last 34 years.  No asbestos exposure.  No other chemicals.  Smokes more than 1 pack/day for the last 50 years (started at age 4) quit smoking for 5 years before starting back again. - Brother died of lung cancer.  Father had bladder cancer.  Maternal uncle had cancer.    Plan: 1.  Stage IIIa (T4 N0 M0) adenocarcinoma of the right lung: - He started XRT today. - He is continuing to smoke cigarettes.  I have recommended smoking cessation.  He reportedly quit in the past for 5 years by using Chantix.  He will let us know when he is ready to start Chantix again. - Reviewed labs today: AST and ALT elevated at 71 and 55.  Other LFTs are normal.  CBC grossly normal. - We discussed weekly chemotherapy with combination of carboplatin and paclitaxel.  We discussed side effects in detail. - He will proceed with first dose of chemotherapy today. - RTC 1 week for follow-up.   2.  Anxiety/depression: - Continue clonazepam 1 mg twice daily.  3.  COPD: - He reports dyspnea in the mornings.  Will start him on albuterol inhaler.    No orders of the defined types were placed in this encounter.     I,Katie Daubenspeck,acting as a Neurosurgeon for Doreatha Massed, MD.,have documented  all relevant documentation on the behalf of Doreatha Massed, MD,as directed by  Doreatha Massed, MD while in the presence of Doreatha Massed, MD.   I, Doreatha Massed MD, have reviewed the above documentation for accuracy and completeness, and I agree with the above.   Doreatha Massed, MD   5/14/20245:23 PM  CHIEF COMPLAINT:   Diagnosis: RUL lung mass    Cancer Staging  Non-small cell cancer of right lung [facility]) Staging form: Lung, AJCC  8th Edition - Clinical stage from 05/01/2022: Stage IIIA (cT4, cN0, cM0) - Unsigned    Prior Therapy: none  Current Therapy:  concurrent chemoradiation with carboplatin + paclitaxel   HISTORY OF PRESENT ILLNESS:   Oncology History  Non-small cell cancer of right lung (HCC)  05/01/2022 Initial Diagnosis   Non-small cell cancer of right lung (HCC)   05/22/2022 -  Chemotherapy   Patient is on Treatment Plan : LUNG Carboplatin + Paclitaxel + XRT q7d        INTERVAL HISTORY:   Carlos [name] is a 67 y.o. male presenting to clinic today for follow up of RUL lung mass . He was last seen by me on 05/01/22.  Since his last visit, his case was discussed in our multidisciplinary tumor board conference. The recommendation was to proceed with concurrent chemoradiation. He was seen by Dr. Mitzi Hansen in radiation oncology on 05/04/22 and underwent CT simulation on 05/15/22.  Today, he states that he is doing well overall. His appetite level is at 40%. His energy level is at 40%.  PAST MEDICAL HISTORY:   Past Medical History: Past Medical History:  Diagnosis Date   Anxiety    Cancer (HCC)    Skin cancer left shoulder   Chronic low back pain    Complication of anesthesia    Dermatofibrosarcoma protubera of right shoulder    Dyspnea    GERD (gastroesophageal reflux disease)    Hyperlipidemia    Lung cancer (HCC) 04/23/2022   PONV (postoperative nausea and vomiting)    Port-A-Cath in place 05/17/2022   Prostate cancer [facility])     Surgical History: Past Surgical History:  Procedure Laterality Date   APPENDECTOMY     APPLICATION OF A-CELL OF EXTREMITY Right 07/09/2012   Procedure: PLACEMENT OF A-CELL TO UPPER EXTREMITY/PLACEMENT OF VAC;  Surgeon: Wayland Denis, DO;  Location:  SURGERY CENTER;  Service: Plastics;  Laterality: Right;   BRONCHIAL BRUSHINGS  04/23/2022   Procedure: BRONCHIAL BRUSHINGS;  Surgeon: Leslye Peer, MD;  Location: [facility] ENDOSCOPY;  Service: Pulmonary;;   BRONCHIAL NEEDLE  ASPIRATION BIOPSY  04/23/2022   Procedure: BRONCHIAL NEEDLE ASPIRATION BIOPSIES;  Surgeon: Leslye Peer, MD;  Location: [facility] ENDOSCOPY;  Service: Pulmonary;;   COLONOSCOPY WITH PROPOFOL N/A 09/30/2015   Procedure: COLONOSCOPY WITH PROPOFOL;  Surgeon: Malissa Hippo, MD;  Location: [facility] ENDO SUITE;  Service: Endoscopy;  Laterality: N/A;  830   EXCISION DERMATOFIBROCARCOMA PROTUBERAN RIGHT SHOULDER  2 - 3 WKS AGO   HEMOSTASIS CONTROL  04/23/2022   Procedure: HEMOSTASIS CONTROL;  Surgeon: Leslye Peer, MD;  Location: [facility] ENDOSCOPY;  Service: Pulmonary;;   INGUINAL HERNIA REPAIR Left 1999   IR IMAGING GUIDED PORT INSERTION  04/30/2022   KNEE ARTHROSCOPY Left 2012   POLYPECTOMY  09/30/2015   Procedure: POLYPECTOMY;  Surgeon: Malissa Hippo, MD;  Location: [facility] ENDO SUITE;  Service: Endoscopy;;  sigmoid   VIDEO BRONCHOSCOPY WITH ENDOBRONCHIAL ULTRASOUND Bilateral 04/23/2022   Procedure: VIDEO BRONCHOSCOPY WITH ENDOBRONCHIAL ULTRASOUND;  Surgeon: Leslye Peer, MD;  Location: [facility] ENDOSCOPY;  Service: Pulmonary;  Laterality: Bilateral;   VIDEO BRONCHOSCOPY WITH RADIAL ENDOBRONCHIAL ULTRASOUND  04/23/2022   Procedure: VIDEO BRONCHOSCOPY WITH RADIAL ENDOBRONCHIAL ULTRASOUND;  Surgeon: Leslye Peer, MD;  Location: [facility] ENDOSCOPY;  Service: Pulmonary;;    Social History: Social History   Socioeconomic History   Marital status: Divorced    Spouse name: Burna Mortimer   Number of children: 1   Years of education: 10th   Highest education level: Not on file  Occupational History    Employer: [employer]    Comment: [employer]  Tobacco Use   Smoking status: Every Day    Packs/day: 1.00    Years: 40.00    Additional pack years: 0.00    Total pack years: 40.00    Types: Cigarettes   Smokeless tobacco: Never  Vaping Use   Vaping Use: Never used  Substance and Sexual Activity   Alcohol use: Yes    Comment: 2-3 cans beer/day   Drug use: No   Sexual activity: Not on file  Other Topics  Concern   Not on file  Social History Narrative   Patient lives at home with his dog. 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 Patient has 10 grade education.Patient is right-handed.Caffeine Use: 1 cup of coffee; 3-4 sodas daily   Social Determinants of Health   Financial Resource Strain: High Risk (05/10/2022)   Overall Financial Resource Strain (CARDIA)    Difficulty of Paying Living Expenses: Very hard  Food Insecurity: Food Insecurity Present (05/04/2022)   Hunger Vital Sign    Worried About Running Out of Food in the Last Year: Sometimes true    Ran Out of Food in the Last Year: Sometimes true  Transportation Needs: No Transportation Needs (05/04/2022)   PRAPARE - Administrator, Civil Service (Medical): No    Lack of Transportation (Non-Medical): No  Physical Activity: Not on file  Stress: Not on file  Social Connections: Not on file  Intimate Partner Violence: Not At Risk (05/04/2022)   Humiliation, Afraid, Rape, and Kick questionnaire    Fear of Current or Ex-Partner: No    Emotionally Abused: No    Physically Abused: No    Sexually Abused: No    Family History: Family History  Problem Relation Age of Onset   Dementia Mother    Cancer Father     Current Medications:  Current Outpatient Medications:    acetaminophen (TYLENOL) 500 MG tablet, Take 1,000 mg by mouth every 6 (six) hours as needed (pain.)., Disp: , Rfl:    albuterol (VENTOLIN HFA) 108 (90 Base) MCG/ACT inhaler, Inhale 2 puffs into the lungs every 6 (six) hours as needed for wheezing or shortness of breath., Disp: 8 g, Rfl: 6   aspirin EC 81 MG tablet, Take 81 mg by mouth at bedtime. Swallow whole., Disp: , Rfl:    CARBOPLATIN IV, Inject into the vein once a week., Disp: , Rfl:    clonazePAM (KLONOPIN) 1 MG disintegrating tablet, Take one tablet by mouth every morning and two tablets by mouth at bedtime, Disp: 90 tablet, Rfl: 2   meloxicam (MOBIC) 15 MG tablet, Take 15  mg by mouth in the morning., Disp: , Rfl:    PACLitaxel (TAXOL IV), Inject into the vein once a week., Disp: , Rfl:    pantoprazole (PROTONIX) 40 MG tablet, Take 40 mg by mouth daily., Disp: , Rfl:    simvastatin (ZOCOR) 20 MG tablet, Take  20 mg by mouth at bedtime., Disp: , Rfl: 2   tamsulosin (FLOMAX) 0.4 MG CAPS capsule, Take 1 capsule (0.4 mg total) by mouth daily. (Patient taking differently: Take 0.4 mg by mouth at bedtime.), Disp: 30 capsule, Rfl: 11   lidocaine-prilocaine (EMLA) cream, Apply a quarter-sized amount to port a cath site and cover with plastic wrap 1 hour prior to infusion appointments, Disp: 30 g, Rfl: 3   prochlorperazine (COMPAZINE) 10 MG tablet, Take 1 tablet (10 mg total) by mouth every 6 (six) hours as needed for nausea or vomiting., Disp: 60 tablet, Rfl: 3   Allergies: Allergies  Allergen Reactions   Ciprofloxacin Swelling and Other (See Comments)    Swelling in hands, numbness in arms and joint pain   Gabapentin Other (See Comments)    Double vision    REVIEW OF SYSTEMS:   Review of Systems  Constitutional:  Negative for chills, fatigue and fever.  HENT:   Negative for lump/mass, mouth sores, nosebleeds, sore throat and trouble swallowing.   Eyes:  Negative for eye problems.  Respiratory:  Positive for cough and shortness of breath.   Cardiovascular:  Negative for chest pain, leg swelling and palpitations.  Gastrointestinal:  Positive for nausea. Negative for abdominal pain, constipation, diarrhea and vomiting.  Genitourinary:  Negative for bladder incontinence, difficulty urinating, dysuria, frequency, hematuria and nocturia.   Musculoskeletal:  Negative for arthralgias, back pain, flank pain, myalgias and neck pain.  Skin:  Negative for itching and rash.  Neurological:  Positive for dizziness. Negative for headaches and numbness.  Hematological:  Does not bruise/bleed easily.  Psychiatric/Behavioral:  Positive for sleep disturbance. Negative for  depression and suicidal ideas. The patient is not nervous/anxious.   All other systems reviewed and are negative.    VITALS:   Blood pressure (!) 143/83.  Wt Readings from Last 3 Encounters:  05/22/22 181 lb 11.2 oz (82.4 kg)  05/04/22 184 lb 12.8 oz (83.8 kg)  05/01/22 182 lb 4.8 oz (82.7 kg)    There is no height or weight on file to calculate BMI.  Performance status (ECOG): 1 - Symptomatic but completely ambulatory  PHYSICAL EXAM:   Physical Exam Vitals and nursing note reviewed. Exam conducted with a chaperone present.  Constitutional:      Appearance: Normal appearance.  Cardiovascular:     Rate and Rhythm: Normal rate and regular rhythm.     Pulses: Normal pulses.     Heart sounds: Normal heart sounds.  Pulmonary:     Effort: Pulmonary effort is normal.     Breath sounds: Normal breath sounds.  Abdominal:     Palpations: Abdomen is soft. There is no hepatomegaly, splenomegaly or mass.     Tenderness: There is no abdominal tenderness.  Musculoskeletal:     Right lower leg: No edema.     Left lower leg: No edema.  Lymphadenopathy:     Cervical: No cervical adenopathy.     Right cervical: No superficial, deep or posterior cervical adenopathy.    Left cervical: No superficial, deep or posterior cervical adenopathy.     Upper Body:     Right upper body: No supraclavicular or axillary adenopathy.     Left upper body: No supraclavicular or axillary adenopathy.  Neurological:     General: No focal deficit present.     Mental Status: He is alert and oriented to person, place, and time.  Psychiatric:        Mood and Affect: Mood normal.  Behavior: Behavior normal.     LABS:      Latest Ref Rng & Units 05/22/2022    8:06 AM 04/29/2022   10:25 PM 04/23/2022    6:02 AM  CBC  WBC 4.0 - 10.5 K/uL 10.7  9.1  9.0   Hemoglobin 13.0 - 17.0 g/dL 16.1  09.6  04.5   Hematocrit 39.0 - 52.0 % 44.3  42.1  41.0   Platelets 150 - 400 K/uL 290  324  337       Latest Ref  Rng & Units 05/22/2022    8:06 AM 04/29/2022   10:25 PM 06/17/2016   11:10 AM  CMP  Glucose 70 - 99 mg/dL 409  86  811   BUN 8 - 23 mg/dL 10  5  6    Creatinine 0.61 - 1.24 mg/dL 9.14  7.82  9.56   Sodium 135 - 145 mmol/L 132  133  132   Potassium 3.5 - 5.1 mmol/L 3.7  4.1  3.8   Chloride 98 - 111 mmol/L 99  99  99   CO2 22 - 32 mmol/L 24  22  23    Calcium 8.9 - 10.3 mg/dL 9.1  8.7  8.8   Total Protein 6.5 - 8.1 g/dL 7.4  7.7  7.0   Total Bilirubin 0.3 - 1.2 mg/dL 0.8  0.6  0.7   Alkaline Phos 38 - 126 U/L 80  74  49   AST 15 - 41 U/L 71  41  21   ALT 0 - 44 U/L 55  21  21      No results found for: "CEA1", "CEA" / No results found for: "CEA1", "CEA" Lab Results  Component Value Date   PSA1 4.7 (H) 04/12/2022   No results found for: "OZH086" No results found for: "CAN125"  No results found for: "TOTALPROTELP", "ALBUMINELP", "A1GS", "A2GS", "BETS", "BETA2SER", "GAMS", "MSPIKE", "SPEI" No results found for: "TIBC", "FERRITIN", "IRONPCTSAT" No results found for: "LDH"   STUDIES:   IR IMAGING GUIDED PORT INSERTION  Result Date: 04/30/2022 INDICATION: 67 year old male with right-sided lung cancer. He presents for placement of a port catheter to establish durable venous access. EXAM: IMPLANTED PORT A CATH PLACEMENT WITH ULTRASOUND AND FLUOROSCOPIC GUIDANCE MEDICATIONS: None. ANESTHESIA/SEDATION: Versed 3 mg IV; Fentanyl 75 mcg IV; Moderate Sedation Time:  16 minutes The patient's vital signs and level of consciousness were continuously monitored during the procedure by the interventional radiology nurse under my direct supervision. FLUOROSCOPY: Radiation exposure index: 1 mGy COMPLICATIONS: None immediate. PROCEDURE: The right neck and chest was prepped with chlorhexidine, and draped in the usual sterile fashion using maximum barrier technique (cap and mask, sterile gown, sterile gloves, large sterile sheet, hand hygiene and cutaneous antiseptic). Local anesthesia was attained by  infiltration with 1% lidocaine with epinephrine. Ultrasound demonstrated patency of the left internal jugular vein, and this was documented with an image. Under real-time ultrasound guidance, this vein was accessed with a 21 gauge micropuncture needle and image documentation was performed. A small dermatotomy was made at the access site with an 11 scalpel. A 0.018" wire was advanced into the SVC and the access needle exchanged for a 37F micropuncture vascular sheath. The 0.018" wire was then removed and a 0.035" wire advanced into the IVC. An appropriate location for the subcutaneous reservoir was selected below the clavicle and an incision was made through the skin and underlying soft tissues. The subcutaneous tissues were then dissected using a combination of blunt and sharp  surgical technique and a pocket was formed. A single lumen power injectable portacatheter was then tunneled through the subcutaneous tissues from the pocket to the dermatotomy and the port reservoir placed within the subcutaneous pocket. The venous access site was then serially dilated and a peel away vascular sheath placed over the wire. The wire was removed and the port catheter advanced into position under fluoroscopic guidance. The catheter tip is positioned in the upper right atrium. This was documented with a spot image. The portacatheter was then tested and found to flush and aspirate well. The port was flushed with saline followed by 100 units/mL heparinized saline. The pocket was then closed in two layers using first subdermal inverted interrupted absorbable sutures followed by a running subcuticular suture. The epidermis was then sealed with Dermabond. The dermatotomy at the venous access site was also closed with Dermabond. IMPRESSION: Successful placement of a left IJ approach Power Port with ultrasound and fluoroscopic guidance. The catheter is ready for use. Electronically Signed   By: Malachy Moan M.D.   On: 04/30/2022 16:13    CT Angio Chest PE W and/or Wo Contrast  Result Date: 04/29/2022 CLINICAL DATA:  Pulmonary embolism (PE) suspected, high prob. Hemoptysis, shortness of breath EXAM: CT ANGIOGRAPHY CHEST WITH CONTRAST TECHNIQUE: Multidetector CT imaging of the chest was performed using the standard protocol during bolus administration of intravenous contrast. Multiplanar CT image reconstructions and MIPs were obtained to evaluate the vascular anatomy. RADIATION DOSE REDUCTION: This exam was performed according to the departmental dose-optimization program which includes automated exposure control, adjustment of the mA and/or kV according to patient size and/or use of iterative reconstruction technique. CONTRAST:  75mL OMNIPAQUE IOHEXOL 350 MG/ML SOLN COMPARISON:  04/13/2022 FINDINGS: Cardiovascular: No filling defects in the pulmonary arteries to suggest pulmonary emboli. Heart is normal size. Aorta is normal caliber. Scattered aortic calcifications. Mediastinum/Nodes: No mediastinal, hilar, or axillary adenopathy. Trachea and esophagus are unremarkable. Thyroid unremarkable. Lungs/Pleura: 9.1 cm right upper lobe mass is again noted as seen on prior study compatible with malignancy. Spiculated right upper lobe nodule measures 2.0 x 1.6 cm, similar to prior study, compatible with malignancy. No effusions. Left lung clear. Upper Abdomen: No acute findings Musculoskeletal: Chest wall soft tissues are unremarkable. No acute bony abnormality. Review of the MIP images confirms the above findings. IMPRESSION: No evidence of pulmonary embolus. Stable 9.1 cm right upper lobe mass hand spiculated 2 cm right upper lobe nodule. Findings compatible with malignancy. Aortic Atherosclerosis (ICD10-I70.0). Electronically Signed   By: Charlett Nose M.D.   On: 04/29/2022 23:27   DG Chest Port 1 View  Result Date: 04/23/2022 CLINICAL DATA:  S/P bronchoscopy with biopsy EXAM: PORTABLE CHEST 1 VIEW COMPARISON:  Chest x-ray April 10, 2022. FINDINGS:  Mildly increased size of masslike consolidation in the right midlung. No visible pneumothorax on this semi erect radiograph. Left lung is clear. No visible pleural effusions. Cardiomediastinal silhouette is within normal limits. IMPRESSION: Mildly increased size of masslike consolidation in the right midlung. No visible pneumothorax on this semi erect radiograph. Electronically Signed   By: Feliberto Harts M.D.   On: 04/23/2022 11:19   DG C-ARM BRONCHOSCOPY  Result Date: 04/23/2022 C-ARM BRONCHOSCOPY: Fluoroscopy was utilized by the requesting physician.  No radiographic interpretation.

## 2022-05-22 ENCOUNTER — Ambulatory Visit
Admission: RE | Admit: 2022-05-22 | Discharge: 2022-05-22 | Disposition: A | Discharge status: Home / Self Care | Payer: Medicare Other | Source: Ambulatory Visit | Attending: Radiation Oncology | Admitting: Radiation Oncology

## 2022-05-22 ENCOUNTER — Other Ambulatory Visit: Payer: Self-pay

## 2022-05-22 ENCOUNTER — Inpatient Hospital Stay (HOSPITAL_BASED_OUTPATIENT_CLINIC_OR_DEPARTMENT_OTHER): Payer: Medicare Other | Admitting: Hematology

## 2022-05-22 ENCOUNTER — Inpatient Hospital Stay: Payer: Medicare Other

## 2022-05-22 VITALS — BP 135/73 | HR 73 | Temp 98.1°F | Resp 18

## 2022-05-22 DIAGNOSIS — Z95828 Presence of other vascular implants and grafts: Secondary | ICD-10-CM

## 2022-05-22 DIAGNOSIS — F17210 Nicotine dependence, cigarettes, uncomplicated: Secondary | ICD-10-CM | POA: Diagnosis not present

## 2022-05-22 DIAGNOSIS — C3491 Malignant neoplasm of unspecified part of right bronchus or lung: Secondary | ICD-10-CM

## 2022-05-22 DIAGNOSIS — Z801 Family history of malignant neoplasm of trachea, bronchus and lung: Secondary | ICD-10-CM | POA: Diagnosis not present

## 2022-05-22 DIAGNOSIS — Z510 Encounter for antineoplastic radiation therapy: Secondary | ICD-10-CM | POA: Diagnosis not present

## 2022-05-22 DIAGNOSIS — Z79899 Other long term (current) drug therapy: Secondary | ICD-10-CM | POA: Diagnosis not present

## 2022-05-22 DIAGNOSIS — C342 Malignant neoplasm of middle lobe, bronchus or lung: Secondary | ICD-10-CM | POA: Diagnosis not present

## 2022-05-22 DIAGNOSIS — Z85828 Personal history of other malignant neoplasm of skin: Secondary | ICD-10-CM | POA: Diagnosis not present

## 2022-05-22 DIAGNOSIS — D3502 Benign neoplasm of left adrenal gland: Secondary | ICD-10-CM | POA: Diagnosis not present

## 2022-05-22 DIAGNOSIS — J449 Chronic obstructive pulmonary disease, unspecified: Secondary | ICD-10-CM | POA: Diagnosis not present

## 2022-05-22 DIAGNOSIS — Z5111 Encounter for antineoplastic chemotherapy: Secondary | ICD-10-CM | POA: Diagnosis not present

## 2022-05-22 DIAGNOSIS — C3411 Malignant neoplasm of upper lobe, right bronchus or lung: Secondary | ICD-10-CM | POA: Diagnosis not present

## 2022-05-22 DIAGNOSIS — F32A Depression, unspecified: Secondary | ICD-10-CM | POA: Diagnosis not present

## 2022-05-22 LAB — CBC WITH DIFFERENTIAL/PLATELET
Abs Immature Granulocytes: 0.04 10*3/uL (ref 0.00–0.07)
Basophils Absolute: 0.1 10*3/uL (ref 0.0–0.1)
Basophils Relative: 1 %
Eosinophils Absolute: 0.3 10*3/uL (ref 0.0–0.5)
Eosinophils Relative: 2 %
HCT: 44.3 % (ref 39.0–52.0)
Hemoglobin: 15.1 g/dL (ref 13.0–17.0)
Immature Granulocytes: 0 %
Lymphocytes Relative: 11 %
Lymphs Abs: 1.2 10*3/uL (ref 0.7–4.0)
MCH: 31.5 pg (ref 26.0–34.0)
MCHC: 34.1 g/dL (ref 30.0–36.0)
MCV: 92.3 fL (ref 80.0–100.0)
Monocytes Absolute: 1.1 10*3/uL — ABNORMAL HIGH (ref 0.1–1.0)
Monocytes Relative: 11 %
Neutro Abs: 8 10*3/uL — ABNORMAL HIGH (ref 1.7–7.7)
Neutrophils Relative %: 75 %
Platelets: 290 10*3/uL (ref 150–400)
RBC: 4.8 MIL/uL (ref 4.22–5.81)
RDW: 14.8 % (ref 11.5–15.5)
WBC: 10.7 10*3/uL — ABNORMAL HIGH (ref 4.0–10.5)
nRBC: 0 % (ref 0.0–0.2)

## 2022-05-22 LAB — COMPREHENSIVE METABOLIC PANEL
ALT: 55 U/L — ABNORMAL HIGH (ref 0–44)
AST: 71 U/L — ABNORMAL HIGH (ref 15–41)
Albumin: 3.9 g/dL (ref 3.5–5.0)
Alkaline Phosphatase: 80 U/L (ref 38–126)
Anion gap: 9 (ref 5–15)
BUN: 10 mg/dL (ref 8–23)
CO2: 24 mmol/L (ref 22–32)
Calcium: 9.1 mg/dL (ref 8.9–10.3)
Chloride: 99 mmol/L (ref 98–111)
Creatinine, Ser: 0.76 mg/dL (ref 0.61–1.24)
GFR, Estimated: >60 mL/min (ref >60)
Glucose, Bld: 104 mg/dL — ABNORMAL HIGH (ref 70–99)
Potassium: 3.7 mmol/L (ref 3.5–5.1)
Sodium: 132 mmol/L — ABNORMAL LOW (ref 135–145)
Total Bilirubin: 0.8 mg/dL (ref 0.3–1.2)
Total Protein: 7.4 g/dL (ref 6.5–8.1)

## 2022-05-22 LAB — RAD ONC ARIA SESSION SUMMARY
Course Elapsed Days: 0
Plan Fractions Treated to Date: 1
Plan Prescribed Dose Per Fraction: 2 Gy
Plan Total Fractions Prescribed: 30
Plan Total Prescribed Dose: 60 Gy
Reference Point Dosage Given to Date: 2 Gy
Reference Point Session Dosage Given: 2 Gy
Session Number: 1

## 2022-05-22 LAB — MAGNESIUM: Magnesium: 2 mg/dL (ref 1.7–2.4)

## 2022-05-22 MED ORDER — ALBUTEROL SULFATE HFA 108 (90 BASE) MCG/ACT IN AERS: 2.0000 | INHALATION_SPRAY | Freq: Four times a day (QID) | RESPIRATORY_TRACT | 6 refills | Status: DC | PRN

## 2022-05-22 MED ORDER — HEPARIN SOD (PORK) LOCK FLUSH 100 UNIT/ML IV SOLN
500.0000 [IU] | Freq: Once | INTRAVENOUS | Status: AC | PRN
  Administered 2022-05-22: 500 [IU]

## 2022-05-22 MED ORDER — PROCHLORPERAZINE MALEATE 10 MG PO TABS
10.0000 mg | ORAL_TABLET | Freq: Four times a day (QID) | ORAL | 3 refills | Status: DC | PRN
Start: 2022-05-22 — End: 2022-07-26

## 2022-05-22 MED ORDER — SODIUM CHLORIDE 0.9% FLUSH
10.0000 mL | INTRAVENOUS | Status: DC | PRN
  Administered 2022-05-22: 10 mL

## 2022-05-22 MED ORDER — SODIUM CHLORIDE 0.9 % IV SOLN: Freq: Once | INTRAVENOUS | Status: AC

## 2022-05-22 MED ORDER — SODIUM CHLORIDE 0.9 % IV SOLN
10.0000 mg | Freq: Once | INTRAVENOUS | Status: AC
  Administered 2022-05-22: 10 mg via INTRAVENOUS
  Filled 2022-05-22: qty 10

## 2022-05-22 MED ORDER — SODIUM CHLORIDE 0.9 % IV SOLN
45.0000 mg/m2 | Freq: Once | INTRAVENOUS | Status: AC
  Administered 2022-05-22: 90 mg via INTRAVENOUS
  Filled 2022-05-22: qty 15

## 2022-05-22 MED ORDER — CETIRIZINE HCL 10 MG/ML IV SOLN
10.0000 mg | Freq: Once | INTRAVENOUS | Status: AC
  Administered 2022-05-22: 10 mg via INTRAVENOUS
  Filled 2022-05-22: qty 1

## 2022-05-22 MED ORDER — SODIUM CHLORIDE 0.9% FLUSH
10.0000 mL | Freq: Once | INTRAVENOUS | Status: AC
  Administered 2022-05-22: 10 mL via INTRAVENOUS

## 2022-05-22 MED ORDER — DIPHENHYDRAMINE HCL 50 MG/ML IJ SOLN: 50.0000 mg | Freq: Once | INTRAMUSCULAR | Status: DC

## 2022-05-22 MED ORDER — FAMOTIDINE IN NACL 20-0.9 MG/50ML-% IV SOLN: 20.0000 mg | Freq: Once | INTRAVENOUS | Status: DC

## 2022-05-22 MED ORDER — PALONOSETRON HCL INJECTION 0.25 MG/5ML
0.2500 mg | Freq: Once | INTRAVENOUS | Status: AC
  Administered 2022-05-22: 0.25 mg via INTRAVENOUS
  Filled 2022-05-22: qty 5

## 2022-05-22 MED ORDER — SODIUM CHLORIDE 0.9 % IV SOLN
220.0000 mg | Freq: Once | INTRAVENOUS | Status: AC
  Administered 2022-05-22: 220 mg via INTRAVENOUS
  Filled 2022-05-22: qty 22

## 2022-05-22 MED ORDER — FAMOTIDINE 20 MG IN NS 100 ML IVPB
20.0000 mg | Freq: Once | INTRAVENOUS | Status: AC
  Administered 2022-05-22: 20 mg via INTRAVENOUS
  Filled 2022-05-22: qty 100

## 2022-05-22 MED ORDER — LIDOCAINE-PRILOCAINE 2.5-2.5 % EX CREA
TOPICAL_CREAM | CUTANEOUS | 3 refills | Status: DC
Start: 2022-05-22 — End: 2022-07-26

## 2022-05-22 NOTE — Patient Instructions (Signed)
[facility] at [facility] Discharge Instructions   You were seen and examined today by Dr. Katragadda.  He reviewed the results of your lab work which are normal/stable.   We will proceed with your treatment today.  Return as scheduled.    Thank you for choosing [facility] at [facility] to provide your oncology and hematology care.  To afford each patient quality time with our provider, please arrive at least 15 minutes before your scheduled appointment time.   If you have a lab appointment with the Cancer Center please come in thru the Main Entrance and check in at the main information desk.  You need to re-schedule your appointment should you arrive 10 or more minutes late.  We strive to give you quality time with our providers, and arriving late affects you and other patients whose appointments are after yours.  Also, if you no show three or more times for appointments you may be dismissed from the clinic at the providers discretion.     Again, thank you for choosing [facility].  Our hope is that these requests will decrease the amount of time that you wait before being seen by our physicians.       _____________________________________________________________  Should you have questions after your visit to [facility], please contact our office at [phone] and follow the prompts.  Our office hours are 8:00 a.m. and 4:30 p.m. Monday - Friday.  Please note that voicemails left after 4:00 p.m. may not be returned until the following business day.  We are closed weekends and major holidays.  You do have access to a nurse 24-7, just call the main number to the clinic [phone] and do not press any options, hold on the line and a nurse will answer the phone.    For prescription refill requests, have your pharmacy contact our office and allow 72 hours.    Due to Covid, you will need to wear a mask upon entering  the hospital. If you do not have a mask, a mask will be given to you at the Main Entrance upon arrival. For doctor visits, patients may have 1 support person age 18 or older with them. For treatment visits, patients can not have anyone with them due to social distancing guidelines and our immunocompromised population.      

## 2022-05-22 NOTE — Progress Notes (Signed)
Pharmacist Chemotherapy Monitoring - Initial Assessment    Anticipated start date: 05/22/22    The following has been reviewed per standard work regarding the patient's treatment regimen: The patient's diagnosis, treatment plan and drug doses, and organ/hematologic function Lab orders and baseline tests specific to treatment regimen  The treatment plan start date, drug sequencing, and pre-medications Prior authorization status  Patient's documented medication list, including drug-drug interaction screen and prescriptions for anti-emetics and supportive care specific to the treatment regimen The drug concentrations, fluid compatibility, administration routes, and timing of the medications to be used The patient's access for treatment and lifetime cumulative dose history, if applicable  The patient's medication allergies and previous infusion related reactions, if applicable   Changes made to treatment plan:  pre-medications   Discontinue diphenhydramine from oncology treatment plan --> Add Quzyttir (cetirizine) 10 mg IVPush x 1 as premedication for oncology treatment plan.    Follow up needed:     Stephens Shire, [facility], 05/22/2022  8:58 AM

## 2022-05-22 NOTE — Progress Notes (Signed)
Patients port flushed without difficulty.  Good blood return noted with no bruising or swelling noted at site.  Stable during access and blood draw.  Patient to remain accessed for treatment. 

## 2022-05-22 NOTE — Patient Instructions (Signed)
[facility]  Discharge Instructions: Thank you for choosing [facility] to provide your oncology and hematology care.  If you have a lab appointment with the Cancer Center - please note that after April 8th, 2024, all labs will be drawn in the cancer center.  You do not have to check in or register with the main entrance as you have in the past but will complete your check-in in the cancer center.  Wear comfortable clothing and clothing appropriate for easy access to any Portacath or PICC line.   We strive to give you quality time with your provider. You may need to reschedule your appointment if you arrive late (15 or more minutes).  Arriving late affects you and other patients whose appointments are after yours.  Also, if you miss three or more appointments without notifying the office, you may be dismissed from the clinic at the provider's discretion.      For prescription refill requests, have your pharmacy contact our office and allow 72 hours for refills to be completed.    Today you received the following chemotherapy and/or immunotherapy agents Paclitaxel and Carboplatin.  Paclitaxel Injection What is this medication? PACLITAXEL (PAK li TAX el) treats some types of cancer. It works by slowing down the growth of cancer cells. This medicine may be used for other purposes; ask your health care provider or pharmacist if you have questions. COMMON BRAND NAME(S): Onxol, Taxol What should I tell my care team before I take this medication? They need to know if you have any of these conditions: Heart disease Liver disease Low white blood cell levels An unusual or allergic reaction to paclitaxel, other medications, foods, dyes, or preservatives If you or your partner are pregnant or trying to get pregnant Breast-feeding How should I use this medication? This medication is injected into a vein. It is given by your care team in a hospital or clinic  setting. Talk to your care team about the use of this medication in children. While it may be given to children for selected conditions, precautions do apply. Overdosage: If you think you have taken too much of this medicine contact a poison control center or emergency room at once. NOTE: This medicine is only for you. Do not share this medicine with others. What if I miss a dose? Keep appointments for follow-up doses. It is important not to miss your dose. Call your care team if you are unable to keep an appointment. What may interact with this medication? Do not take this medication with any of the following: Live virus vaccines Other medications may affect the way this medication works. Talk with your care team about all of the medications you take. They may suggest changes to your treatment plan to lower the risk of side effects and to make sure your medications work as intended. This list may not describe all possible interactions. Give your health care provider a list of all the medicines, herbs, non-prescription drugs, or dietary supplements you use. Also tell them if you smoke, drink alcohol, or use illegal drugs. Some items may interact with your medicine. What should I watch for while using this medication? Your condition will be monitored carefully while you are receiving this medication. You may need blood work while taking this medication. This medication may make you feel generally unwell. This is not uncommon as chemotherapy can affect healthy cells as well as cancer cells. Report any side effects. Continue your course of treatment  even though you feel ill unless your care team tells you to stop. This medication can cause serious allergic reactions. To reduce the risk, your care team may give you other medications to take before receiving this one. Be sure to follow the directions from your care team. This medication may increase your risk of getting an infection. Call your care team  for advice if you get a fever, chills, sore throat, or other symptoms of a cold or flu. Do not treat yourself. Try to avoid being around people who are sick. This medication may increase your risk to bruise or bleed. Call your care team if you notice any unusual bleeding. Be careful brushing or flossing your teeth or using a toothpick because you may get an infection or bleed more easily. If you have any dental work done, tell your dentist you are receiving this medication. Talk to your care team if you may be pregnant. Serious birth defects can occur if you take this medication during pregnancy. Talk to your care team before breastfeeding. Changes to your treatment plan may be needed. What side effects may I notice from receiving this medication? Side effects that you should report to your care team as soon as possible: Allergic reactions--skin rash, itching, hives, swelling of the face, lips, tongue, or throat Heart rhythm changes--fast or irregular heartbeat, dizziness, feeling faint or lightheaded, chest pain, trouble breathing Increase in blood pressure Infection--fever, chills, cough, sore throat, wounds that don't heal, pain or trouble when passing urine, general feeling of discomfort or being unwell Low blood pressure--dizziness, feeling faint or lightheaded, blurry vision Low red blood cell level--unusual weakness or fatigue, dizziness, headache, trouble breathing Painful swelling, warmth, or redness of the skin, blisters or sores at the infusion site Pain, tingling, or numbness in the hands or feet Slow heartbeat--dizziness, feeling faint or lightheaded, confusion, trouble breathing, unusual weakness or fatigue Unusual bruising or bleeding Side effects that usually do not require medical attention (report to your care team if they continue or are bothersome): Diarrhea Hair loss Joint pain Loss of appetite Muscle pain Nausea Vomiting This list may not describe all possible side  effects. Call your doctor for medical advice about side effects. You may report side effects to FDA at 1-800-FDA-1088. Where should I keep my medication? This medication is given in a hospital or clinic. It will not be stored at home. NOTE: This sheet is a summary. It may not cover all possible information. If you have questions about this medicine, talk to your doctor, pharmacist, or health care provider.  2023 Elsevier/Gold Standard (2021-05-11 00:00:00)   Carboplatin Injection What is this medication? CARBOPLATIN (KAR boe pla tin) treats some types of cancer. It works by slowing down the growth of cancer cells. This medicine may be used for other purposes; ask your health care provider or pharmacist if you have questions. COMMON BRAND NAME(S): Paraplatin What should I tell my care team before I take this medication? They need to know if you have any of these conditions: Blood disorders Hearing problems Kidney disease Recent or ongoing radiation therapy An unusual or allergic reaction to carboplatin, cisplatin, other medications, foods, dyes, or preservatives Pregnant or trying to get pregnant Breast-feeding How should I use this medication? This medication is injected into a vein. It is given by your care team in a hospital or clinic setting. Talk to your care team about the use of this medication in children. Special care may be needed. Overdosage: If you think you have  taken too much of this medicine contact a poison control center or emergency room at once. NOTE: This medicine is only for you. Do not share this medicine with others. What if I miss a dose? Keep appointments for follow-up doses. It is important not to miss your dose. Call your care team if you are unable to keep an appointment. What may interact with this medication? Medications for seizures Some antibiotics, such as amikacin, gentamicin, neomycin, streptomycin, tobramycin Vaccines This list may not describe all  possible interactions. Give your health care provider a list of all the medicines, herbs, non-prescription drugs, or dietary supplements you use. Also tell them if you smoke, drink alcohol, or use illegal drugs. Some items may interact with your medicine. What should I watch for while using this medication? Your condition will be monitored carefully while you are receiving this medication. You may need blood work while taking this medication. This medication may make you feel generally unwell. This is not uncommon, as chemotherapy can affect healthy cells as well as cancer cells. Report any side effects. Continue your course of treatment even though you feel ill unless your care team tells you to stop. In some cases, you may be given additional medications to help with side effects. Follow all directions for their use. This medication may increase your risk of getting an infection. Call your care team for advice if you get a fever, chills, sore throat, or other symptoms of a cold or flu. Do not treat yourself. Try to avoid being around people who are sick. Avoid taking medications that contain aspirin, acetaminophen, ibuprofen, naproxen, or ketoprofen unless instructed by your care team. These medications may hide a fever. Be careful brushing or flossing your teeth or using a toothpick because you may get an infection or bleed more easily. If you have any dental work done, tell your dentist you are receiving this medication. Talk to your care team if you wish to become pregnant or think you might be pregnant. This medication can cause serious birth defects. Talk to your care team about effective forms of contraception. Do not breast-feed while taking this medication. What side effects may I notice from receiving this medication? Side effects that you should report to your care team as soon as possible: Allergic reactions--skin rash, itching, hives, swelling of the face, lips, tongue, or  throat Infection--fever, chills, cough, sore throat, wounds that don't heal, pain or trouble when passing urine, general feeling of discomfort or being unwell Low red blood cell level--unusual weakness or fatigue, dizziness, headache, trouble breathing Pain, tingling, or numbness in the hands or feet, muscle weakness, change in vision, confusion or trouble speaking, loss of balance or coordination, trouble walking, seizures Unusual bruising or bleeding Side effects that usually do not require medical attention (report to your care team if they continue or are bothersome): Hair loss Nausea Unusual weakness or fatigue Vomiting This list may not describe all possible side effects. Call your doctor for medical advice about side effects. You may report side effects to FDA at 1-800-FDA-1088. Where should I keep my medication? This medication is given in a hospital or clinic. It will not be stored at home. NOTE: This sheet is a summary. It may not cover all possible information. If you have questions about this medicine, talk to your doctor, pharmacist, or health care provider.  2023 Elsevier/Gold Standard (2007-02-15 00:00:00)       To help prevent nausea and vomiting after your treatment, we encourage you to  take your nausea medication as directed.  BELOW ARE SYMPTOMS THAT SHOULD BE REPORTED IMMEDIATELY: *FEVER GREATER THAN 100.4 F (38 C) OR HIGHER *CHILLS OR SWEATING *NAUSEA AND VOMITING THAT IS NOT CONTROLLED WITH YOUR NAUSEA MEDICATION *UNUSUAL SHORTNESS OF BREATH *UNUSUAL BRUISING OR BLEEDING *URINARY PROBLEMS (pain or burning when urinating, or frequent urination) *BOWEL PROBLEMS (unusual diarrhea, constipation, pain near the anus) TENDERNESS IN MOUTH AND THROAT WITH OR WITHOUT PRESENCE OF ULCERS (sore throat, sores in mouth, or a toothache) UNUSUAL RASH, SWELLING OR PAIN  UNUSUAL VAGINAL DISCHARGE OR ITCHING   Items with * indicate a potential emergency and should be followed up as  soon as possible or go to the Emergency Department if any problems should occur.  Please show the CHEMOTHERAPY ALERT CARD or IMMUNOTHERAPY ALERT CARD at check-in to the Emergency Department and triage nurse.  Should you have questions after your visit or need to cancel or reschedule your appointment, please contact [facility] [phone]  and follow the prompts.  Office hours are 8:00 a.m. to 4:30 p.m. Monday - Friday. Please note that voicemails left after 4:00 p.m. may not be returned until the following business day.  We are closed weekends and major holidays. You have access to a nurse at all times for urgent questions. Please call the main number to the clinic [phone] and follow the prompts.  For any non-urgent questions, you may also contact your provider using MyChart. We now offer e-Visits for anyone 33 and older to request care online for non-urgent symptoms. For details visit [email or web address].   Also download the MyChart app! Go to the app store, search "MyChart", open the app, select [facility], and log in with your MyChart username and password.

## 2022-05-22 NOTE — Progress Notes (Signed)
Patient has been examined by Dr. Katragadda. Vital signs and labs have been reviewed by MD - ANC, Creatinine, LFTs, hemoglobin, and platelets are within treatment parameters per M.D. - pt may proceed with treatment.  Primary RN and pharmacy notified.  

## 2022-05-22 NOTE — Progress Notes (Signed)
Patient presents today for chemotherapy infusion of Paclitaxel and Carboplatin. Patient is in satisfactory condition with no new complaints voiced.  Vital signs are stable.  Labs reviewed by Dr. Ellin Saba during the office visit and all labs are within treatment parameters.  We will proceed with treatment per MD orders.   Patient tolerated treatment well with no complaints voiced.  Patient left ambulatory in stable condition.  Vital signs stable at discharge.  Follow up as scheduled.

## 2022-05-23 ENCOUNTER — Ambulatory Visit
Admission: RE | Admit: 2022-05-23 | Discharge: 2022-05-23 | Disposition: A | Discharge status: Home / Self Care | Payer: Medicare Other | Source: Ambulatory Visit | Attending: Radiation Oncology | Admitting: Radiation Oncology

## 2022-05-23 ENCOUNTER — Other Ambulatory Visit: Payer: Self-pay

## 2022-05-23 DIAGNOSIS — F32A Depression, unspecified: Secondary | ICD-10-CM | POA: Diagnosis not present

## 2022-05-23 DIAGNOSIS — D3502 Benign neoplasm of left adrenal gland: Secondary | ICD-10-CM | POA: Diagnosis not present

## 2022-05-23 DIAGNOSIS — Z5111 Encounter for antineoplastic chemotherapy: Secondary | ICD-10-CM | POA: Diagnosis not present

## 2022-05-23 DIAGNOSIS — J449 Chronic obstructive pulmonary disease, unspecified: Secondary | ICD-10-CM | POA: Diagnosis not present

## 2022-05-23 DIAGNOSIS — Z801 Family history of malignant neoplasm of trachea, bronchus and lung: Secondary | ICD-10-CM | POA: Diagnosis not present

## 2022-05-23 DIAGNOSIS — Z85828 Personal history of other malignant neoplasm of skin: Secondary | ICD-10-CM | POA: Diagnosis not present

## 2022-05-23 DIAGNOSIS — Z510 Encounter for antineoplastic radiation therapy: Secondary | ICD-10-CM | POA: Diagnosis not present

## 2022-05-23 DIAGNOSIS — F17210 Nicotine dependence, cigarettes, uncomplicated: Secondary | ICD-10-CM | POA: Diagnosis not present

## 2022-05-23 DIAGNOSIS — C3411 Malignant neoplasm of upper lobe, right bronchus or lung: Secondary | ICD-10-CM | POA: Diagnosis not present

## 2022-05-23 DIAGNOSIS — Z79899 Other long term (current) drug therapy: Secondary | ICD-10-CM | POA: Diagnosis not present

## 2022-05-23 DIAGNOSIS — C342 Malignant neoplasm of middle lobe, bronchus or lung: Secondary | ICD-10-CM | POA: Diagnosis not present

## 2022-05-23 LAB — RAD ONC ARIA SESSION SUMMARY
Course Elapsed Days: 1
Plan Fractions Treated to Date: 2
Plan Prescribed Dose Per Fraction: 2 Gy
Plan Total Fractions Prescribed: 30
Plan Total Prescribed Dose: 60 Gy
Reference Point Dosage Given to Date: 4 Gy
Reference Point Session Dosage Given: 2 Gy
Session Number: 2

## 2022-05-24 ENCOUNTER — Ambulatory Visit
Admission: RE | Admit: 2022-05-24 | Discharge: 2022-05-24 | Disposition: A | Discharge status: Home / Self Care | Payer: Medicare Other | Source: Ambulatory Visit | Attending: Radiation Oncology | Admitting: Radiation Oncology

## 2022-05-24 ENCOUNTER — Other Ambulatory Visit: Payer: Self-pay

## 2022-05-24 DIAGNOSIS — Z5111 Encounter for antineoplastic chemotherapy: Secondary | ICD-10-CM | POA: Diagnosis not present

## 2022-05-24 DIAGNOSIS — Z79899 Other long term (current) drug therapy: Secondary | ICD-10-CM | POA: Diagnosis not present

## 2022-05-24 DIAGNOSIS — Z510 Encounter for antineoplastic radiation therapy: Secondary | ICD-10-CM | POA: Diagnosis not present

## 2022-05-24 DIAGNOSIS — F17210 Nicotine dependence, cigarettes, uncomplicated: Secondary | ICD-10-CM | POA: Diagnosis not present

## 2022-05-24 DIAGNOSIS — C3411 Malignant neoplasm of upper lobe, right bronchus or lung: Secondary | ICD-10-CM | POA: Diagnosis not present

## 2022-05-24 DIAGNOSIS — F32A Depression, unspecified: Secondary | ICD-10-CM | POA: Diagnosis not present

## 2022-05-24 DIAGNOSIS — C342 Malignant neoplasm of middle lobe, bronchus or lung: Secondary | ICD-10-CM | POA: Diagnosis not present

## 2022-05-24 DIAGNOSIS — J449 Chronic obstructive pulmonary disease, unspecified: Secondary | ICD-10-CM | POA: Diagnosis not present

## 2022-05-24 DIAGNOSIS — D3502 Benign neoplasm of left adrenal gland: Secondary | ICD-10-CM | POA: Diagnosis not present

## 2022-05-24 DIAGNOSIS — Z801 Family history of malignant neoplasm of trachea, bronchus and lung: Secondary | ICD-10-CM | POA: Diagnosis not present

## 2022-05-24 DIAGNOSIS — Z85828 Personal history of other malignant neoplasm of skin: Secondary | ICD-10-CM | POA: Diagnosis not present

## 2022-05-24 LAB — RAD ONC ARIA SESSION SUMMARY
Course Elapsed Days: 2
Plan Fractions Treated to Date: 3
Plan Prescribed Dose Per Fraction: 2 Gy
Plan Total Fractions Prescribed: 30
Plan Total Prescribed Dose: 60 Gy
Reference Point Dosage Given to Date: 6 Gy
Reference Point Session Dosage Given: 2 Gy
Session Number: 3

## 2022-05-25 ENCOUNTER — Ambulatory Visit
Admission: RE | Admit: 2022-05-25 | Discharge: 2022-05-25 | Disposition: A | Discharge status: Home / Self Care | Payer: Medicare Other | Source: Ambulatory Visit | Attending: Radiation Oncology | Admitting: Radiation Oncology

## 2022-05-25 ENCOUNTER — Other Ambulatory Visit: Payer: Self-pay

## 2022-05-25 DIAGNOSIS — F17210 Nicotine dependence, cigarettes, uncomplicated: Secondary | ICD-10-CM | POA: Diagnosis not present

## 2022-05-25 DIAGNOSIS — Z85828 Personal history of other malignant neoplasm of skin: Secondary | ICD-10-CM | POA: Diagnosis not present

## 2022-05-25 DIAGNOSIS — F32A Depression, unspecified: Secondary | ICD-10-CM | POA: Diagnosis not present

## 2022-05-25 DIAGNOSIS — Z510 Encounter for antineoplastic radiation therapy: Secondary | ICD-10-CM | POA: Diagnosis not present

## 2022-05-25 DIAGNOSIS — C342 Malignant neoplasm of middle lobe, bronchus or lung: Secondary | ICD-10-CM | POA: Diagnosis not present

## 2022-05-25 DIAGNOSIS — C3411 Malignant neoplasm of upper lobe, right bronchus or lung: Secondary | ICD-10-CM | POA: Diagnosis not present

## 2022-05-25 DIAGNOSIS — D3502 Benign neoplasm of left adrenal gland: Secondary | ICD-10-CM | POA: Diagnosis not present

## 2022-05-25 DIAGNOSIS — Z5111 Encounter for antineoplastic chemotherapy: Secondary | ICD-10-CM | POA: Diagnosis not present

## 2022-05-25 DIAGNOSIS — Z79899 Other long term (current) drug therapy: Secondary | ICD-10-CM | POA: Diagnosis not present

## 2022-05-25 DIAGNOSIS — Z801 Family history of malignant neoplasm of trachea, bronchus and lung: Secondary | ICD-10-CM | POA: Diagnosis not present

## 2022-05-25 DIAGNOSIS — J449 Chronic obstructive pulmonary disease, unspecified: Secondary | ICD-10-CM | POA: Diagnosis not present

## 2022-05-25 LAB — RAD ONC ARIA SESSION SUMMARY
Course Elapsed Days: 3
Plan Fractions Treated to Date: 4
Plan Prescribed Dose Per Fraction: 2 Gy
Plan Total Fractions Prescribed: 30
Plan Total Prescribed Dose: 60 Gy
Reference Point Dosage Given to Date: 8 Gy
Reference Point Session Dosage Given: 2 Gy
Session Number: 4

## 2022-05-28 ENCOUNTER — Ambulatory Visit
Admission: RE | Admit: 2022-05-28 | Discharge: 2022-05-28 | Disposition: A | Discharge status: Home / Self Care | Payer: Medicare Other | Source: Ambulatory Visit | Attending: Radiation Oncology | Admitting: Radiation Oncology

## 2022-05-28 ENCOUNTER — Other Ambulatory Visit: Payer: Self-pay

## 2022-05-28 DIAGNOSIS — Z801 Family history of malignant neoplasm of trachea, bronchus and lung: Secondary | ICD-10-CM | POA: Diagnosis not present

## 2022-05-28 DIAGNOSIS — Z5111 Encounter for antineoplastic chemotherapy: Secondary | ICD-10-CM | POA: Diagnosis not present

## 2022-05-28 DIAGNOSIS — Z85828 Personal history of other malignant neoplasm of skin: Secondary | ICD-10-CM | POA: Diagnosis not present

## 2022-05-28 DIAGNOSIS — F17210 Nicotine dependence, cigarettes, uncomplicated: Secondary | ICD-10-CM | POA: Diagnosis not present

## 2022-05-28 DIAGNOSIS — D3502 Benign neoplasm of left adrenal gland: Secondary | ICD-10-CM | POA: Diagnosis not present

## 2022-05-28 DIAGNOSIS — F32A Depression, unspecified: Secondary | ICD-10-CM | POA: Diagnosis not present

## 2022-05-28 DIAGNOSIS — C3411 Malignant neoplasm of upper lobe, right bronchus or lung: Secondary | ICD-10-CM | POA: Diagnosis not present

## 2022-05-28 DIAGNOSIS — Z79899 Other long term (current) drug therapy: Secondary | ICD-10-CM | POA: Diagnosis not present

## 2022-05-28 DIAGNOSIS — J449 Chronic obstructive pulmonary disease, unspecified: Secondary | ICD-10-CM | POA: Diagnosis not present

## 2022-05-28 DIAGNOSIS — Z510 Encounter for antineoplastic radiation therapy: Secondary | ICD-10-CM | POA: Diagnosis not present

## 2022-05-28 DIAGNOSIS — C342 Malignant neoplasm of middle lobe, bronchus or lung: Secondary | ICD-10-CM | POA: Diagnosis not present

## 2022-05-28 LAB — RAD ONC ARIA SESSION SUMMARY
Course Elapsed Days: 6
Plan Fractions Treated to Date: 5
Plan Prescribed Dose Per Fraction: 2 Gy
Plan Total Fractions Prescribed: 30
Plan Total Prescribed Dose: 60 Gy
Reference Point Dosage Given to Date: 10 Gy
Reference Point Session Dosage Given: 2 Gy
Session Number: 5

## 2022-05-29 ENCOUNTER — Ambulatory Visit
Admission: RE | Admit: 2022-05-29 | Discharge: 2022-05-29 | Disposition: A | Discharge status: Home / Self Care | Payer: Medicare Other | Source: Ambulatory Visit | Attending: Radiation Oncology | Admitting: Radiation Oncology

## 2022-05-29 ENCOUNTER — Inpatient Hospital Stay: Payer: Medicare Other

## 2022-05-29 ENCOUNTER — Inpatient Hospital Stay: Payer: Medicare Other | Admitting: Hematology

## 2022-05-29 ENCOUNTER — Other Ambulatory Visit: Payer: Self-pay

## 2022-05-29 VITALS — BP 161/85 | HR 75 | Temp 97.5°F | Resp 18

## 2022-05-29 DIAGNOSIS — Z510 Encounter for antineoplastic radiation therapy: Secondary | ICD-10-CM | POA: Diagnosis not present

## 2022-05-29 DIAGNOSIS — Z95828 Presence of other vascular implants and grafts: Secondary | ICD-10-CM

## 2022-05-29 DIAGNOSIS — J449 Chronic obstructive pulmonary disease, unspecified: Secondary | ICD-10-CM | POA: Diagnosis not present

## 2022-05-29 DIAGNOSIS — Z5111 Encounter for antineoplastic chemotherapy: Secondary | ICD-10-CM | POA: Diagnosis not present

## 2022-05-29 DIAGNOSIS — C3411 Malignant neoplasm of upper lobe, right bronchus or lung: Secondary | ICD-10-CM | POA: Diagnosis not present

## 2022-05-29 DIAGNOSIS — Z801 Family history of malignant neoplasm of trachea, bronchus and lung: Secondary | ICD-10-CM | POA: Diagnosis not present

## 2022-05-29 DIAGNOSIS — F17210 Nicotine dependence, cigarettes, uncomplicated: Secondary | ICD-10-CM | POA: Diagnosis not present

## 2022-05-29 DIAGNOSIS — C3491 Malignant neoplasm of unspecified part of right bronchus or lung: Secondary | ICD-10-CM

## 2022-05-29 DIAGNOSIS — F32A Depression, unspecified: Secondary | ICD-10-CM | POA: Diagnosis not present

## 2022-05-29 DIAGNOSIS — Z85828 Personal history of other malignant neoplasm of skin: Secondary | ICD-10-CM | POA: Diagnosis not present

## 2022-05-29 DIAGNOSIS — Z79899 Other long term (current) drug therapy: Secondary | ICD-10-CM | POA: Diagnosis not present

## 2022-05-29 DIAGNOSIS — D3502 Benign neoplasm of left adrenal gland: Secondary | ICD-10-CM | POA: Diagnosis not present

## 2022-05-29 DIAGNOSIS — C342 Malignant neoplasm of middle lobe, bronchus or lung: Secondary | ICD-10-CM | POA: Diagnosis not present

## 2022-05-29 LAB — RAD ONC ARIA SESSION SUMMARY
Course Elapsed Days: 7
Plan Fractions Treated to Date: 6
Plan Prescribed Dose Per Fraction: 2 Gy
Plan Total Fractions Prescribed: 30
Plan Total Prescribed Dose: 60 Gy
Reference Point Dosage Given to Date: 12 Gy
Reference Point Session Dosage Given: 2 Gy
Session Number: 6

## 2022-05-29 LAB — COMPREHENSIVE METABOLIC PANEL WITH GFR
ALT: 31 U/L (ref 0–44)
AST: 27 U/L (ref 15–41)
Albumin: 3.7 g/dL (ref 3.5–5.0)
Alkaline Phosphatase: 62 U/L (ref 38–126)
Anion gap: 8 (ref 5–15)
BUN: 15 mg/dL (ref 8–23)
CO2: 23 mmol/L (ref 22–32)
Calcium: 8.6 mg/dL — ABNORMAL LOW (ref 8.9–10.3)
Chloride: 102 mmol/L (ref 98–111)
Creatinine, Ser: 0.82 mg/dL (ref 0.61–1.24)
GFR, Estimated: >60 mL/min (ref >60)
Glucose, Bld: 122 mg/dL — ABNORMAL HIGH (ref 70–99)
Potassium: 3.7 mmol/L (ref 3.5–5.1)
Sodium: 133 mmol/L — ABNORMAL LOW (ref 135–145)
Total Bilirubin: 0.5 mg/dL (ref 0.3–1.2)
Total Protein: 7.2 g/dL (ref 6.5–8.1)

## 2022-05-29 LAB — CBC WITH DIFFERENTIAL/PLATELET
Abs Immature Granulocytes: 0.06 K/uL (ref 0.00–0.07)
Basophils Absolute: 0.1 K/uL (ref 0.0–0.1)
Basophils Relative: 1 %
Eosinophils Absolute: 0.2 K/uL (ref 0.0–0.5)
Eosinophils Relative: 4 %
HCT: 40.8 % (ref 39.0–52.0)
Hemoglobin: 13.9 g/dL (ref 13.0–17.0)
Immature Granulocytes: 1 %
Lymphocytes Relative: 11 %
Lymphs Abs: 0.7 K/uL (ref 0.7–4.0)
MCH: 32 pg (ref 26.0–34.0)
MCHC: 34.1 g/dL (ref 30.0–36.0)
MCV: 93.8 fL (ref 80.0–100.0)
Monocytes Absolute: 0.6 K/uL (ref 0.1–1.0)
Monocytes Relative: 9 %
Neutro Abs: 4.8 K/uL (ref 1.7–7.7)
Neutrophils Relative %: 74 %
Platelets: 381 K/uL (ref 150–400)
RBC: 4.35 MIL/uL (ref 4.22–5.81)
RDW: 14.6 % (ref 11.5–15.5)
WBC: 6.5 K/uL (ref 4.0–10.5)
nRBC: 0 % (ref 0.0–0.2)

## 2022-05-29 LAB — MAGNESIUM: Magnesium: 2 mg/dL (ref 1.7–2.4)

## 2022-05-29 MED ORDER — SODIUM CHLORIDE 0.9% FLUSH
10.0000 mL | INTRAVENOUS | Status: DC | PRN
  Administered 2022-05-29: 10 mL

## 2022-05-29 MED ORDER — SODIUM CHLORIDE 0.9% FLUSH
10.0000 mL | Freq: Once | INTRAVENOUS | Status: AC
  Administered 2022-05-29: 10 mL via INTRAVENOUS

## 2022-05-29 MED ORDER — SODIUM CHLORIDE 0.9 % IV SOLN
45.0000 mg/m2 | Freq: Once | INTRAVENOUS | Status: AC
  Administered 2022-05-29: 90 mg via INTRAVENOUS
  Filled 2022-05-29: qty 15

## 2022-05-29 MED ORDER — SODIUM CHLORIDE 0.9 % IV SOLN
10.0000 mg | Freq: Once | INTRAVENOUS | Status: AC
  Administered 2022-05-29: 10 mg via INTRAVENOUS
  Filled 2022-05-29: qty 10

## 2022-05-29 MED ORDER — CETIRIZINE HCL 10 MG/ML IV SOLN
10.0000 mg | Freq: Once | INTRAVENOUS | Status: AC
  Administered 2022-05-29: 10 mg via INTRAVENOUS
  Filled 2022-05-29: qty 1

## 2022-05-29 MED ORDER — PALONOSETRON HCL INJECTION 0.25 MG/5ML
0.2500 mg | Freq: Once | INTRAVENOUS | Status: AC
  Administered 2022-05-29: 0.25 mg via INTRAVENOUS
  Filled 2022-05-29: qty 5

## 2022-05-29 MED ORDER — ALBUTEROL SULFATE HFA 108 (90 BASE) MCG/ACT IN AERS: 2.0000 | INHALATION_SPRAY | Freq: Four times a day (QID) | RESPIRATORY_TRACT | 6 refills | Status: DC | PRN

## 2022-05-29 MED ORDER — SODIUM CHLORIDE 0.9 % IV SOLN
220.0000 mg | Freq: Once | INTRAVENOUS | Status: AC
  Administered 2022-05-29: 220 mg via INTRAVENOUS
  Filled 2022-05-29: qty 22

## 2022-05-29 MED ORDER — FAMOTIDINE IN NACL 20-0.9 MG/50ML-% IV SOLN
20.0000 mg | Freq: Once | INTRAVENOUS | Status: AC
  Administered 2022-05-29: 20 mg via INTRAVENOUS
  Filled 2022-05-29: qty 50

## 2022-05-29 MED ORDER — HEPARIN SOD (PORK) LOCK FLUSH 100 UNIT/ML IV SOLN
500.0000 [IU] | Freq: Once | INTRAVENOUS | Status: AC | PRN
  Administered 2022-05-29: 500 [IU]

## 2022-05-29 MED ORDER — SODIUM CHLORIDE 0.9 % IV SOLN: Freq: Once | INTRAVENOUS | Status: AC

## 2022-05-29 NOTE — Progress Notes (Signed)
Message received from A. Dareen Piano RN / Dr. Ellin Saba to proceed with treatment. Labs and vital signs within parameters for treatment.   Treatment given today per MD orders. Tolerated infusion without adverse affects. Vital signs stable. No complaints at this time. Discharged from clinic ambulatory in stable condition. Alert and oriented x 3. F/U with [facility] as scheduled.

## 2022-05-29 NOTE — Patient Instructions (Signed)
[facility]  Discharge Instructions: Thank you for choosing [facility] to provide your oncology and hematology care.  If you have a lab appointment with the Cancer Center - please note that after April 8th, 2024, all labs will be drawn in the cancer center.  You do not have to check in or register with the main entrance as you have in the past but will complete your check-in in the cancer center.  Wear comfortable clothing and clothing appropriate for easy access to any Portacath or PICC line.   We strive to give you quality time with your provider. You may need to reschedule your appointment if you arrive late (15 or more minutes).  Arriving late affects you and other patients whose appointments are after yours.  Also, if you miss three or more appointments without notifying the office, you may be dismissed from the clinic at the provider's discretion.      For prescription refill requests, have your pharmacy contact our office and allow 72 hours for refills to be completed.    Today you received the following chemotherapy and/or immunotherapy agents Taxol, Carboplatin. Carboplatin Injection What is this medication? CARBOPLATIN (KAR boe pla tin) treats some types of cancer. It works by slowing down the growth of cancer cells. This medicine may be used for other purposes; ask your health care provider or pharmacist if you have questions. COMMON BRAND NAME(S): Paraplatin What should I tell my care team before I take this medication? They need to know if you have any of these conditions: Blood disorders Hearing problems Kidney disease Recent or ongoing radiation therapy An unusual or allergic reaction to carboplatin, cisplatin, other medications, foods, dyes, or preservatives Pregnant or trying to get pregnant Breast-feeding How should I use this medication? This medication is injected into a vein. It is given by your care team in a hospital or clinic  setting. Talk to your care team about the use of this medication in children. Special care may be needed. Overdosage: If you think you have taken too much of this medicine contact a poison control center or emergency room at once. NOTE: This medicine is only for you. Do not share this medicine with others. What if I miss a dose? Keep appointments for follow-up doses. It is important not to miss your dose. Call your care team if you are unable to keep an appointment. What may interact with this medication? Medications for seizures Some antibiotics, such as amikacin, gentamicin, neomycin, streptomycin, tobramycin Vaccines This list may not describe all possible interactions. Give your health care provider a list of all the medicines, herbs, non-prescription drugs, or dietary supplements you use. Also tell them if you smoke, drink alcohol, or use illegal drugs. Some items may interact with your medicine. What should I watch for while using this medication? Your condition will be monitored carefully while you are receiving this medication. You may need blood work while taking this medication. This medication may make you feel generally unwell. This is not uncommon, as chemotherapy can affect healthy cells as well as cancer cells. Report any side effects. Continue your course of treatment even though you feel ill unless your care team tells you to stop. In some cases, you may be given additional medications to help with side effects. Follow all directions for their use. This medication may increase your risk of getting an infection. Call your care team for advice if you get a fever, chills, sore throat, or other   symptoms of a cold or flu. Do not treat yourself. Try to avoid being around people who are sick. Avoid taking medications that contain aspirin, acetaminophen, ibuprofen, naproxen, or ketoprofen unless instructed by your care team. These medications may hide a fever. Be careful brushing or  flossing your teeth or using a toothpick because you may get an infection or bleed more easily. If you have any dental work done, tell your dentist you are receiving this medication. Talk to your care team if you wish to become pregnant or think you might be pregnant. This medication can cause serious birth defects. Talk to your care team about effective forms of contraception. Do not breast-feed while taking this medication. What side effects may I notice from receiving this medication? Side effects that you should report to your care team as soon as possible: Allergic reactions--skin rash, itching, hives, swelling of the face, lips, tongue, or throat Infection--fever, chills, cough, sore throat, wounds that don't heal, pain or trouble when passing urine, general feeling of discomfort or being unwell Low red blood cell level--unusual weakness or fatigue, dizziness, headache, trouble breathing Pain, tingling, or numbness in the hands or feet, muscle weakness, change in vision, confusion or trouble speaking, loss of balance or coordination, trouble walking, seizures Unusual bruising or bleeding Side effects that usually do not require medical attention (report to your care team if they continue or are bothersome): Hair loss Nausea Unusual weakness or fatigue Vomiting This list may not describe all possible side effects. Call your doctor for medical advice about side effects. You may report side effects to FDA at 1-800-FDA-1088. Where should I keep my medication? This medication is given in a hospital or clinic. It will not be stored at home. NOTE: This sheet is a summary. It may not cover all possible information. If you have questions about this medicine, talk to your doctor, pharmacist, or health care provider.  2023 Elsevier/Gold Standard (2007-02-15 00:00:00) Paclitaxel Injection What is this medication? PACLITAXEL (PAK li TAX el) treats some types of cancer. It works by slowing down the  growth of cancer cells. This medicine may be used for other purposes; ask your health care provider or pharmacist if you have questions. COMMON BRAND NAME(S): Onxol, Taxol What should I tell my care team before I take this medication? They need to know if you have any of these conditions: Heart disease Liver disease Low white blood cell levels An unusual or allergic reaction to paclitaxel, other medications, foods, dyes, or preservatives If you or your partner are pregnant or trying to get pregnant Breast-feeding How should I use this medication? This medication is injected into a vein. It is given by your care team in a hospital or clinic setting. Talk to your care team about the use of this medication in children. While it may be given to children for selected conditions, precautions do apply. Overdosage: If you think you have taken too much of this medicine contact a poison control center or emergency room at once. NOTE: This medicine is only for you. Do not share this medicine with others. What if I miss a dose? Keep appointments for follow-up doses. It is important not to miss your dose. Call your care team if you are unable to keep an appointment. What may interact with this medication? Do not take this medication with any of the following: Live virus vaccines Other medications may affect the way this medication works. Talk with your care team about all of the medications you   take. They may suggest changes to your treatment plan to lower the risk of side effects and to make sure your medications work as intended. This list may not describe all possible interactions. Give your health care provider a list of all the medicines, herbs, non-prescription drugs, or dietary supplements you use. Also tell them if you smoke, drink alcohol, or use illegal drugs. Some items may interact with your medicine. What should I watch for while using this medication? Your condition will be monitored  carefully while you are receiving this medication. You may need blood work while taking this medication. This medication may make you feel generally unwell. This is not uncommon as chemotherapy can affect healthy cells as well as cancer cells. Report any side effects. Continue your course of treatment even though you feel ill unless your care team tells you to stop. This medication can cause serious allergic reactions. To reduce the risk, your care team may give you other medications to take before receiving this one. Be sure to follow the directions from your care team. This medication may increase your risk of getting an infection. Call your care team for advice if you get a fever, chills, sore throat, or other symptoms of a cold or flu. Do not treat yourself. Try to avoid being around people who are sick. This medication may increase your risk to bruise or bleed. Call your care team if you notice any unusual bleeding. Be careful brushing or flossing your teeth or using a toothpick because you may get an infection or bleed more easily. If you have any dental work done, tell your dentist you are receiving this medication. Talk to your care team if you may be pregnant. Serious birth defects can occur if you take this medication during pregnancy. Talk to your care team before breastfeeding. Changes to your treatment plan may be needed. What side effects may I notice from receiving this medication? Side effects that you should report to your care team as soon as possible: Allergic reactions--skin rash, itching, hives, swelling of the face, lips, tongue, or throat Heart rhythm changes--fast or irregular heartbeat, dizziness, feeling faint or lightheaded, chest pain, trouble breathing Increase in blood pressure Infection--fever, chills, cough, sore throat, wounds that don't heal, pain or trouble when passing urine, general feeling of discomfort or being unwell Low blood pressure--dizziness, feeling faint  or lightheaded, blurry vision Low red blood cell level--unusual weakness or fatigue, dizziness, headache, trouble breathing Painful swelling, warmth, or redness of the skin, blisters or sores at the infusion site Pain, tingling, or numbness in the hands or feet Slow heartbeat--dizziness, feeling faint or lightheaded, confusion, trouble breathing, unusual weakness or fatigue Unusual bruising or bleeding Side effects that usually do not require medical attention (report to your care team if they continue or are bothersome): Diarrhea Hair loss Joint pain Loss of appetite Muscle pain Nausea Vomiting This list may not describe all possible side effects. Call your doctor for medical advice about side effects. You may report side effects to FDA at 1-800-FDA-1088. Where should I keep my medication? This medication is given in a hospital or clinic. It will not be stored at home. NOTE: This sheet is a summary. It may not cover all possible information. If you have questions about this medicine, talk to your doctor, pharmacist, or health care provider.  2023 Elsevier/Gold Standard (2021-05-11 00:00:00)       To help prevent nausea and vomiting after your treatment, we encourage you to take your nausea medication   as directed.  BELOW ARE SYMPTOMS THAT SHOULD BE REPORTED IMMEDIATELY: *FEVER GREATER THAN 100.4 F (38 C) OR HIGHER *CHILLS OR SWEATING *NAUSEA AND VOMITING THAT IS NOT CONTROLLED WITH YOUR NAUSEA MEDICATION *UNUSUAL SHORTNESS OF BREATH *UNUSUAL BRUISING OR BLEEDING *URINARY PROBLEMS (pain or burning when urinating, or frequent urination) *BOWEL PROBLEMS (unusual diarrhea, constipation, pain near the anus) TENDERNESS IN MOUTH AND THROAT WITH OR WITHOUT PRESENCE OF ULCERS (sore throat, sores in mouth, or a toothache) UNUSUAL RASH, SWELLING OR PAIN  UNUSUAL VAGINAL DISCHARGE OR ITCHING   Items with * indicate a potential emergency and should be followed up as soon as possible or go to  the Emergency Department if any problems should occur.  Please show the CHEMOTHERAPY ALERT CARD or IMMUNOTHERAPY ALERT CARD at check-in to the Emergency Department and triage nurse.  Should you have questions after your visit or need to cancel or reschedule your appointment, please contact [facility] [phone]  and follow the prompts.  Office hours are 8:00 a.m. to 4:30 p.m. Monday - Friday. Please note that voicemails left after 4:00 p.m. may not be returned until the following business day.  We are closed weekends and major holidays. You have access to a nurse at all times for urgent questions. Please call the main number to the clinic [phone] and follow the prompts.  For any non-urgent questions, you may also contact your provider using MyChart. We now offer e-Visits for anyone 18 and older to request care online for non-urgent symptoms. For details visit [email or web address].   Also download the MyChart app! Go to the app store, search "MyChart", open the app, select [place], and log in with your MyChart username and password.   

## 2022-05-29 NOTE — Progress Notes (Signed)
[facility] [address], [place] [address]    Clinic Day:  05/29/2022  Referring physician: Benita Stabile, MD  Patient Care Team: Benita Stabile, MD as PCP - General (Internal Medicine) Doreatha Massed, MD as Medical Oncologist (Medical Oncology) Therese Sarah, RN as Oncology Nurse Navigator (Medical Oncology)   ASSESSMENT & PLAN:   Assessment: 1.  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  No hemoptysis/weight loss. - Chest x-ray (04/10/2022): Mid right lung mass. - CT chest (04/13/2022): 9.2 x 5.7 x 6.6 cm right upper lobe mass with lobulations appearing to extend across the minor fissure into the right middle lobe and right lower lobe.  Satellite lesion in the right upper lobe measures 1.6 x 1.4 x 1.4 cm.  No findings of chest wall invasion.  Borderline prominent lower right paratracheal lymph node 0.9 cm.  Old granulomatous disease.  Small left adrenal adenoma. - Has right posterior chest wall pain for the past 6 months, dull type.  He is not requiring any pain medication. - Brain MRI (04/19/2022): No metastatic disease. - PET scan (04/19/2022): 9 x 5.7 cm right upper lobe mass with SUV 11.8.  1.3 x 0.9 cm satellite nodule in the right upper lobe with SUV 3.8.  No adenopathy or distant metastatic disease. - Bronchoscopy and biopsy by Dr. Delton Coombes - Pathology (04/23/2022): Both right upper lobe lung nodule FNA consistent with poorly differentiated carcinoma.  IHC diffusely positive for TTF-1, Napsin, synaptophysin and CK7.  CD56 negative.  P63 and CK5/6 negative.  Given Napsin positivity, favor adenocarcinoma.  There is also diffuse synaptophysin positivity consistent with neuroendocrine differentiation.  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 - Discussed with Dr. Dorris Fetch.  Not a surgical candidate as he requires pneumonectomy and has  suboptimal DLCO. - Chemoradiation with carboplatin and paclitaxel started on 05/22/2022   2.  Social/family history: - He is seen with his 2 sisters today.  Lives by himself and is independent of ADLs and IADLs.  He retired after working at [employer] for the last 34 years.  No asbestos exposure.  No other chemicals.  Smokes more than 1 pack/day for the last 50 years (started at age 59) quit smoking for 5 years before starting back again. - Brother died of lung cancer.  Father had bladder cancer.  Maternal uncle had cancer.    Plan: 1.  Stage IIIa (T4 N0 M0) adenocarcinoma of the right lung: - Tolerated first weekly dose of carboplatin and paclitaxel reasonably well.  Denied any GI side effects. - Labs today: AST and ALT have normalized.  Creatinine is normal.  CBC was grossly normal. - We talked about smoking cessation.  He is not interested in Chantix during treatments. - Proceed with week #2 treatment today without any dose modifications.  RTC 1 week for follow-up.   2.  Anxiety/depression: - Continue clonazepam 1 mg twice daily.  This is well-controlled.   3.  COPD: - Dyspnea in the mornings.  He will start albuterol inhaler.    No orders of the defined types were placed in this encounter.     I,Katie Daubenspeck,acting as a Neurosurgeon for Doreatha Massed, MD.,have documented all relevant documentation on the behalf of Doreatha Massed, MD,as directed by  Doreatha Massed, MD while in the presence of Doreatha Massed, MD.   I, Doreatha Massed MD, have reviewed  the above documentation for accuracy and completeness, and I agree with the above.   Doreatha Massed, MD   5/21/20246:29 PM  CHIEF COMPLAINT:   Diagnosis: RUL lung adenocarcinoma   Cancer Staging  Non-small cell cancer of right lung [facility]) Staging form: Lung, AJCC 8th Edition - Clinical stage from 05/01/2022: Stage IIIA (cT4, cN0, cM0) - Unsigned    Prior Therapy: none  Current Therapy:   concurrent chemoradiation with carboplatin + paclitaxel    HISTORY OF PRESENT ILLNESS:   Oncology History  Non-small cell cancer of right lung (HCC)  05/01/2022 Initial Diagnosis   Non-small cell cancer of right lung (HCC)   05/22/2022 -  Chemotherapy   Patient is on Treatment Plan : LUNG Carboplatin + Paclitaxel + XRT q7d        INTERVAL HISTORY:   Carlos [name] is a 67 y.o. male presenting to clinic today for follow up of RUL lung adenocarcinoma. He was last seen by me on 05/22/22.  Today, he states that he is doing well overall. His appetite level is at 100%. His energy level is at 65%.  PAST MEDICAL HISTORY:   Past Medical History: Past Medical History:  Diagnosis Date   Anxiety    Cancer (HCC)    Skin cancer left shoulder   Chronic low back pain    Complication of anesthesia    Dermatofibrosarcoma protubera of right shoulder    Dyspnea    GERD (gastroesophageal reflux disease)    Hyperlipidemia    Lung cancer (HCC) 04/23/2022   PONV (postoperative nausea and vomiting)    Port-A-Cath in place 05/17/2022   Prostate cancer [facility])     Surgical History: Past Surgical History:  Procedure Laterality Date   APPENDECTOMY     APPLICATION OF A-CELL OF EXTREMITY Right 07/09/2012   Procedure: PLACEMENT OF A-CELL TO UPPER EXTREMITY/PLACEMENT OF VAC;  Surgeon: Wayland Denis, DO;  Location: [facility];  Service: Plastics;  Laterality: Right;   BRONCHIAL BRUSHINGS  04/23/2022   Procedure: BRONCHIAL BRUSHINGS;  Surgeon: Leslye Peer, MD;  Location: [facility] ENDOSCOPY;  Service: Pulmonary;;   BRONCHIAL NEEDLE ASPIRATION BIOPSY  04/23/2022   Procedure: BRONCHIAL NEEDLE ASPIRATION BIOPSIES;  Surgeon: Leslye Peer, MD;  Location: [facility] ENDOSCOPY;  Service: Pulmonary;;   COLONOSCOPY WITH PROPOFOL N/A 09/30/2015   Procedure: COLONOSCOPY WITH PROPOFOL;  Surgeon: Malissa Hippo, MD;  Location: [facility] ENDO [facility];  Service: Endoscopy;  Laterality: N/A;  830   EXCISION DERMATOFIBROCARCOMA  PROTUBERAN RIGHT SHOULDER  2 - 3 WKS AGO   HEMOSTASIS CONTROL  04/23/2022   Procedure: HEMOSTASIS CONTROL;  Surgeon: Leslye Peer, MD;  Location: [facility] ENDOSCOPY;  Service: Pulmonary;;   INGUINAL HERNIA REPAIR Left 1999   IR IMAGING GUIDED PORT INSERTION  04/30/2022   KNEE ARTHROSCOPY Left 2012   POLYPECTOMY  09/30/2015   Procedure: POLYPECTOMY;  Surgeon: Malissa Hippo, MD;  Location: [facility];  Service: Endoscopy;;  sigmoid   VIDEO BRONCHOSCOPY WITH ENDOBRONCHIAL ULTRASOUND Bilateral 04/23/2022   Procedure: VIDEO BRONCHOSCOPY WITH ENDOBRONCHIAL ULTRASOUND;  Surgeon: Leslye Peer, MD;  Location: [facility] ENDOSCOPY;  Service: Pulmonary;  Laterality: Bilateral;   VIDEO BRONCHOSCOPY WITH RADIAL ENDOBRONCHIAL ULTRASOUND  04/23/2022   Procedure: VIDEO BRONCHOSCOPY WITH RADIAL ENDOBRONCHIAL ULTRASOUND;  Surgeon: Leslye Peer, MD;  Location: [facility] ENDOSCOPY;  Service: Pulmonary;;    Social History: Social History   Socioeconomic History   Marital status: Divorced    Spouse name: Burna Mortimer   Number of children: 1   Years  of education: 10th   Highest education level: Not on file  Occupational History    Employer: [employer]    Comment: [employer]  Tobacco Use   Smoking status: Every Day    Packs/day: 1.00    Years: 40.00    Additional pack years: 0.00    Total pack years: 40.00    Types: Cigarettes   Smokeless tobacco: Never  Vaping Use   Vaping Use: Never used  Substance and Sexual Activity   Alcohol use: Yes    Comment: 2-3 cans beer/day   Drug use: No   Sexual activity: Not on file  Other Topics Concern   Not on file  Social History Narrative   Patient lives at home with his dog. 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 Patient has 10 grade education.Patient is right-handed.Caffeine Use: 1 cup of coffee; 3-4 sodas daily   Social Determinants of Health   Financial Resource Strain: High Risk (05/10/2022)   Overall Financial Resource  Strain (CARDIA)    Difficulty of Paying Living Expenses: Very hard  Food Insecurity: Food Insecurity Present (05/04/2022)   Hunger Vital Sign    Worried About Running Out of Food in the Last Year: Sometimes true    Ran Out of Food in the Last Year: Sometimes true  Transportation Needs: No Transportation Needs (05/04/2022)   PRAPARE - Administrator, Civil Service (Medical): No    Lack of Transportation (Non-Medical): No  Physical Activity: Not on file  Stress: Not on file  Social Connections: Not on file  Intimate Partner Violence: Not At Risk (05/04/2022)   Humiliation, Afraid, Rape, and Kick questionnaire    Fear of Current or Ex-Partner: No    Emotionally Abused: No    Physically Abused: No    Sexually Abused: No    Family History: Family History  Problem Relation Age of Onset   Dementia Mother    Cancer Father     Current Medications:  Current Outpatient Medications:    acetaminophen (TYLENOL) 500 MG tablet, Take 1,000 mg by mouth every 6 (six) hours as needed (pain.)., Disp: , Rfl:    aspirin EC 81 MG tablet, Take 81 mg by mouth at bedtime. Swallow whole., Disp: , Rfl:    CARBOPLATIN IV, Inject into the vein once a week., Disp: , Rfl:    clonazePAM (KLONOPIN) 1 MG disintegrating tablet, Take one tablet by mouth every morning and two tablets by mouth at bedtime, Disp: 90 tablet, Rfl: 2   lidocaine-prilocaine (EMLA) cream, Apply a quarter-sized amount to port a cath site and cover with plastic wrap 1 hour prior to infusion appointments, Disp: 30 g, Rfl: 3   meloxicam (MOBIC) 15 MG tablet, Take 15 mg by mouth in the morning., Disp: , Rfl:    PACLitaxel (TAXOL IV), Inject into the vein once a week., Disp: , Rfl:    pantoprazole (PROTONIX) 40 MG tablet, Take 40 mg by mouth daily., Disp: , Rfl:    prochlorperazine (COMPAZINE) 10 MG tablet, Take 1 tablet (10 mg total) by mouth every 6 (six) hours as needed for nausea or vomiting., Disp: 60 tablet, Rfl: 3   simvastatin  (ZOCOR) 20 MG tablet, Take 20 mg by mouth at bedtime., Disp: , Rfl: 2   tamsulosin (FLOMAX) 0.4 MG CAPS capsule, Take 1 capsule (0.4 mg total) by mouth daily. (Patient taking differently: Take 0.4 mg by mouth at bedtime.), Disp: 30 capsule, Rfl: 11   albuterol (  VENTOLIN HFA) 108 (90 Base) MCG/ACT inhaler, Inhale 2 puffs into the lungs every 6 (six) hours as needed for wheezing or shortness of breath., Disp: 8 g, Rfl: 6 No current facility-administered medications for this visit.  Facility-Administered Medications Ordered in Other Visits:    sodium chloride flush (NS) 0.9 % injection 10 mL, 10 mL, Intracatheter, PRN, Doreatha Massed, MD, 10 mL at 05/29/22 1403   Allergies: Allergies  Allergen Reactions   Ciprofloxacin Swelling and Other (See Comments)    Swelling in hands, numbness in arms and joint pain   Gabapentin Other (See Comments)    Double vision    REVIEW OF SYSTEMS:   Review of Systems  Constitutional:  Negative for chills, fatigue and fever.  HENT:   Negative for lump/mass, mouth sores, nosebleeds, sore throat and trouble swallowing.   Eyes:  Negative for eye problems.  Respiratory:  Positive for cough and shortness of breath.   Cardiovascular:  Negative for chest pain, leg swelling and palpitations.  Gastrointestinal:  Negative for abdominal pain, constipation, diarrhea, nausea and vomiting.  Genitourinary:  Negative for bladder incontinence, difficulty urinating, dysuria, frequency, hematuria and nocturia.   Musculoskeletal:  Negative for arthralgias, back pain, flank pain, myalgias and neck pain.  Skin:  Negative for itching and rash.  Neurological:  Positive for dizziness. Negative for headaches and numbness.  Hematological:  Does not bruise/bleed easily.  Psychiatric/Behavioral:  Positive for sleep disturbance. Negative for depression and suicidal ideas. The patient is nervous/anxious.   All other systems reviewed and are negative.    VITALS:   There were no  vitals taken for this visit.  Wt Readings from Last 3 Encounters:  05/29/22 184 lb 9.6 oz (83.7 kg)  05/22/22 181 lb 11.2 oz (82.4 kg)  05/04/22 184 lb 12.8 oz (83.8 kg)    There is no height or weight on file to calculate BMI.  Performance status (ECOG): 1 - Symptomatic but completely ambulatory  PHYSICAL EXAM:   Physical Exam Vitals and nursing note reviewed. Exam conducted with a chaperone present.  Constitutional:      Appearance: Normal appearance.  Cardiovascular:     Rate and Rhythm: Normal rate and regular rhythm.     Pulses: Normal pulses.     Heart sounds: Normal heart sounds.  Pulmonary:     Effort: Pulmonary effort is normal.     Breath sounds: Normal breath sounds.  Abdominal:     Palpations: Abdomen is soft. There is no hepatomegaly, splenomegaly or mass.     Tenderness: There is no abdominal tenderness.  Musculoskeletal:     Right lower leg: No edema.     Left lower leg: No edema.  Lymphadenopathy:     Cervical: No cervical adenopathy.     Right cervical: No superficial, deep or posterior cervical adenopathy.    Left cervical: No superficial, deep or posterior cervical adenopathy.     Upper Body:     Right upper body: No supraclavicular or axillary adenopathy.     Left upper body: No supraclavicular or axillary adenopathy.  Neurological:     General: No focal deficit present.     Mental Status: He is alert and oriented to person, place, and time.  Psychiatric:        Mood and Affect: Mood normal.        Behavior: Behavior normal.     LABS:      Latest Ref Rng & Units 05/29/2022    9:40 AM 05/22/2022  8:06 AM 04/29/2022   10:25 PM  CBC  WBC 4.0 - 10.5 K/uL 6.5  10.7  9.1   Hemoglobin 13.0 - 17.0 g/dL 16.1  09.6  04.5   Hematocrit 39.0 - 52.0 % 40.8  44.3  42.1   Platelets 150 - 400 K/uL 381  290  324       Latest Ref Rng & Units 05/29/2022    9:40 AM 05/22/2022    8:06 AM 04/29/2022   10:25 PM  CMP  Glucose 70 - 99 mg/dL 409  811  86   BUN 8  - 23 mg/dL 15  10  5    Creatinine 0.61 - 1.24 mg/dL 9.14  7.82  9.56   Sodium 135 - 145 mmol/L 133  132  133   Potassium 3.5 - 5.1 mmol/L 3.7  3.7  4.1   Chloride 98 - 111 mmol/L 102  99  99   CO2 22 - 32 mmol/L 23  24  22    Calcium 8.9 - 10.3 mg/dL 8.6  9.1  8.7   Total Protein 6.5 - 8.1 g/dL 7.2  7.4  7.7   Total Bilirubin 0.3 - 1.2 mg/dL 0.5  0.8  0.6   Alkaline Phos 38 - 126 U/L 62  80  74   AST 15 - 41 U/L 27  71  41   ALT 0 - 44 U/L 31  55  21      No results found for: "CEA1", "CEA" / No results found for: "CEA1", "CEA" Lab Results  Component Value Date   PSA1 4.7 (H) 04/12/2022   No results found for: "OZH086" No results found for: "CAN125"  No results found for: "TOTALPROTELP", "ALBUMINELP", "A1GS", "A2GS", "BETS", "BETA2SER", "GAMS", "MSPIKE", "SPEI" No results found for: "TIBC", "FERRITIN", "IRONPCTSAT" No results found for: "LDH"   STUDIES:   IR IMAGING GUIDED PORT INSERTION  Result Date: 04/30/2022 INDICATION: 67 year old male with right-sided lung cancer. He presents for placement of a port catheter to establish durable venous access. EXAM: IMPLANTED PORT A CATH PLACEMENT WITH ULTRASOUND AND FLUOROSCOPIC GUIDANCE MEDICATIONS: None. ANESTHESIA/SEDATION: Versed 3 mg IV; Fentanyl 75 mcg IV; Moderate Sedation Time:  16 minutes The patient's vital signs and level of consciousness were continuously monitored during the procedure by the interventional radiology nurse under my direct supervision. FLUOROSCOPY: Radiation exposure index: 1 mGy COMPLICATIONS: None immediate. PROCEDURE: The right neck and chest was prepped with chlorhexidine, and draped in the usual sterile fashion using maximum barrier technique (cap and mask, sterile gown, sterile gloves, large sterile sheet, hand hygiene and cutaneous antiseptic). Local anesthesia was attained by infiltration with 1% lidocaine with epinephrine. Ultrasound demonstrated patency of the left internal jugular vein, and this was  documented with an image. Under real-time ultrasound guidance, this vein was accessed with a 21 gauge micropuncture needle and image documentation was performed. A small dermatotomy was made at the access site with an 11 scalpel. A 0.018" wire was advanced into the SVC and the access needle exchanged for a 57F micropuncture vascular sheath. The 0.018" wire was then removed and a 0.035" wire advanced into the IVC. An appropriate location for the subcutaneous reservoir was selected below the clavicle and an incision was made through the skin and underlying soft tissues. The subcutaneous tissues were then dissected using a combination of blunt and sharp surgical technique and a pocket was formed. A single lumen power injectable portacatheter was then tunneled through the subcutaneous tissues from the pocket to the dermatotomy and  the port reservoir placed within the subcutaneous pocket. The venous access site was then serially dilated and a peel away vascular sheath placed over the wire. The wire was removed and the port catheter advanced into position under fluoroscopic guidance. The catheter tip is positioned in the upper right atrium. This was documented with a spot image. The portacatheter was then tested and found to flush and aspirate well. The port was flushed with saline followed by 100 units/mL heparinized saline. The pocket was then closed in two layers using first subdermal inverted interrupted absorbable sutures followed by a running subcuticular suture. The epidermis was then sealed with Dermabond. The dermatotomy at the venous access site was also closed with Dermabond. IMPRESSION: Successful placement of a left IJ approach Power Port with ultrasound and fluoroscopic guidance. The catheter is ready for use. Electronically Signed   By: Malachy Moan M.D.   On: 04/30/2022 16:13   CT Angio Chest PE W and/or Wo Contrast  Result Date: 04/29/2022 CLINICAL DATA:  Pulmonary embolism (PE) suspected, high  prob. Hemoptysis, shortness of breath EXAM: CT ANGIOGRAPHY CHEST WITH CONTRAST TECHNIQUE: Multidetector CT imaging of the chest was performed using the standard protocol during bolus administration of intravenous contrast. Multiplanar CT image reconstructions and MIPs were obtained to evaluate the vascular anatomy. RADIATION DOSE REDUCTION: This exam was performed according to the departmental dose-optimization program which includes automated exposure control, adjustment of the mA and/or kV according to patient size and/or use of iterative reconstruction technique. CONTRAST:  75mL OMNIPAQUE IOHEXOL 350 MG/ML SOLN COMPARISON:  04/13/2022 FINDINGS: Cardiovascular: No filling defects in the pulmonary arteries to suggest pulmonary emboli. Heart is normal size. Aorta is normal caliber. Scattered aortic calcifications. Mediastinum/Nodes: No mediastinal, hilar, or axillary adenopathy. Trachea and esophagus are unremarkable. Thyroid unremarkable. Lungs/Pleura: 9.1 cm right upper lobe mass is again noted as seen on prior study compatible with malignancy. Spiculated right upper lobe nodule measures 2.0 x 1.6 cm, similar to prior study, compatible with malignancy. No effusions. Left lung clear. Upper Abdomen: No acute findings Musculoskeletal: Chest wall soft tissues are unremarkable. No acute bony abnormality. Review of the MIP images confirms the above findings. IMPRESSION: No evidence of pulmonary embolus. Stable 9.1 cm right upper lobe mass hand spiculated 2 cm right upper lobe nodule. Findings compatible with malignancy. Aortic Atherosclerosis (ICD10-I70.0). Electronically Signed   By: Charlett Nose M.D.   On: 04/29/2022 23:27

## 2022-05-29 NOTE — Patient Instructions (Signed)
[facility] Discharge Instructions   You were seen and examined today by Dr. Katragadda.  He reviewed the results of your lab work which are normal/stable.   We will proceed with your treatment today.  Return as scheduled.    Thank you for choosing [facility] at [facility] to provide your oncology and hematology care.  To afford each patient quality time with our provider, please arrive at least 15 minutes before your scheduled appointment time.   If you have a lab appointment with the Cancer Center please come in thru the Main Entrance and check in at the main information desk.  You need to re-schedule your appointment should you arrive 10 or more minutes late.  We strive to give you quality time with our providers, and arriving late affects you and other patients whose appointments are after yours.  Also, if you no show three or more times for appointments you may be dismissed from the clinic at the providers discretion.     Again, thank you for choosing [facility].  Our hope is that these requests will decrease the amount of time that you wait before being seen by our physicians.       _____________________________________________________________  Should you have questions after your visit to [facility], please contact our office at [phone] and follow the prompts.  Our office hours are 8:00 a.m. and 4:30 p.m. Monday - Friday.  Please note that voicemails left after 4:00 p.m. may not be returned until the following business day.  We are closed weekends and major holidays.  You do have access to a nurse 24-7, just call the main number to the clinic [phone] and do not press any options, hold on the line and a nurse will answer the phone.    For prescription refill requests, have your pharmacy contact our office and allow 72 hours.    Due to Covid, you will need to wear a mask upon entering  the hospital. If you do not have a mask, a mask will be given to you at the Main Entrance upon arrival. For doctor visits, patients may have 1 support person age 18 or older with them. For treatment visits, patients can not have anyone with them due to social distancing guidelines and our immunocompromised population.      

## 2022-05-29 NOTE — Progress Notes (Signed)
Patient has been examined by Dr. Katragadda. Vital signs and labs have been reviewed by MD - ANC, Creatinine, LFTs, hemoglobin, and platelets are within treatment parameters per M.D. - pt may proceed with treatment.  Primary RN and pharmacy notified.  

## 2022-05-30 ENCOUNTER — Ambulatory Visit
Admission: RE | Admit: 2022-05-30 | Discharge: 2022-05-30 | Disposition: A | Discharge status: Home / Self Care | Payer: Medicare Other | Source: Ambulatory Visit | Attending: Radiation Oncology | Admitting: Radiation Oncology

## 2022-05-30 ENCOUNTER — Other Ambulatory Visit: Payer: Self-pay

## 2022-05-30 DIAGNOSIS — C342 Malignant neoplasm of middle lobe, bronchus or lung: Secondary | ICD-10-CM | POA: Diagnosis not present

## 2022-05-30 DIAGNOSIS — J449 Chronic obstructive pulmonary disease, unspecified: Secondary | ICD-10-CM | POA: Diagnosis not present

## 2022-05-30 DIAGNOSIS — F17210 Nicotine dependence, cigarettes, uncomplicated: Secondary | ICD-10-CM | POA: Diagnosis not present

## 2022-05-30 DIAGNOSIS — Z510 Encounter for antineoplastic radiation therapy: Secondary | ICD-10-CM | POA: Diagnosis not present

## 2022-05-30 DIAGNOSIS — C3411 Malignant neoplasm of upper lobe, right bronchus or lung: Secondary | ICD-10-CM | POA: Diagnosis not present

## 2022-05-30 DIAGNOSIS — Z5111 Encounter for antineoplastic chemotherapy: Secondary | ICD-10-CM | POA: Diagnosis not present

## 2022-05-30 DIAGNOSIS — Z801 Family history of malignant neoplasm of trachea, bronchus and lung: Secondary | ICD-10-CM | POA: Diagnosis not present

## 2022-05-30 DIAGNOSIS — D3502 Benign neoplasm of left adrenal gland: Secondary | ICD-10-CM | POA: Diagnosis not present

## 2022-05-30 DIAGNOSIS — F32A Depression, unspecified: Secondary | ICD-10-CM | POA: Diagnosis not present

## 2022-05-30 DIAGNOSIS — Z85828 Personal history of other malignant neoplasm of skin: Secondary | ICD-10-CM | POA: Diagnosis not present

## 2022-05-30 DIAGNOSIS — Z79899 Other long term (current) drug therapy: Secondary | ICD-10-CM | POA: Diagnosis not present

## 2022-05-30 LAB — RAD ONC ARIA SESSION SUMMARY
Course Elapsed Days: 8
Plan Fractions Treated to Date: 7
Plan Prescribed Dose Per Fraction: 2 Gy
Plan Total Fractions Prescribed: 30
Plan Total Prescribed Dose: 60 Gy
Reference Point Dosage Given to Date: 14 Gy
Reference Point Session Dosage Given: 2 Gy
Session Number: 7

## 2022-05-31 ENCOUNTER — Ambulatory Visit
Admission: RE | Admit: 2022-05-31 | Discharge: 2022-05-31 | Disposition: A | Discharge status: Home / Self Care | Payer: Medicare Other | Source: Ambulatory Visit | Attending: Radiation Oncology | Admitting: Radiation Oncology

## 2022-05-31 ENCOUNTER — Other Ambulatory Visit: Payer: Self-pay

## 2022-05-31 DIAGNOSIS — D3502 Benign neoplasm of left adrenal gland: Secondary | ICD-10-CM | POA: Diagnosis not present

## 2022-05-31 DIAGNOSIS — J449 Chronic obstructive pulmonary disease, unspecified: Secondary | ICD-10-CM | POA: Diagnosis not present

## 2022-05-31 DIAGNOSIS — F17210 Nicotine dependence, cigarettes, uncomplicated: Secondary | ICD-10-CM | POA: Diagnosis not present

## 2022-05-31 DIAGNOSIS — C3411 Malignant neoplasm of upper lobe, right bronchus or lung: Secondary | ICD-10-CM | POA: Diagnosis not present

## 2022-05-31 DIAGNOSIS — F32A Depression, unspecified: Secondary | ICD-10-CM | POA: Diagnosis not present

## 2022-05-31 DIAGNOSIS — C342 Malignant neoplasm of middle lobe, bronchus or lung: Secondary | ICD-10-CM | POA: Diagnosis not present

## 2022-05-31 DIAGNOSIS — Z85828 Personal history of other malignant neoplasm of skin: Secondary | ICD-10-CM | POA: Diagnosis not present

## 2022-05-31 DIAGNOSIS — Z510 Encounter for antineoplastic radiation therapy: Secondary | ICD-10-CM | POA: Diagnosis not present

## 2022-05-31 DIAGNOSIS — Z801 Family history of malignant neoplasm of trachea, bronchus and lung: Secondary | ICD-10-CM | POA: Diagnosis not present

## 2022-05-31 DIAGNOSIS — Z5111 Encounter for antineoplastic chemotherapy: Secondary | ICD-10-CM | POA: Diagnosis not present

## 2022-05-31 DIAGNOSIS — Z79899 Other long term (current) drug therapy: Secondary | ICD-10-CM | POA: Diagnosis not present

## 2022-05-31 LAB — RAD ONC ARIA SESSION SUMMARY
Course Elapsed Days: 9
Plan Fractions Treated to Date: 8
Plan Prescribed Dose Per Fraction: 2 Gy
Plan Total Fractions Prescribed: 30
Plan Total Prescribed Dose: 60 Gy
Reference Point Dosage Given to Date: 16 Gy
Reference Point Session Dosage Given: 2 Gy
Session Number: 8

## 2022-06-01 ENCOUNTER — Ambulatory Visit
Admission: RE | Admit: 2022-06-01 | Discharge: 2022-06-01 | Disposition: A | Discharge status: Home / Self Care | Payer: Medicare Other | Source: Ambulatory Visit | Attending: Radiation Oncology | Admitting: Radiation Oncology

## 2022-06-01 ENCOUNTER — Other Ambulatory Visit: Payer: Self-pay

## 2022-06-01 DIAGNOSIS — Z801 Family history of malignant neoplasm of trachea, bronchus and lung: Secondary | ICD-10-CM | POA: Diagnosis not present

## 2022-06-01 DIAGNOSIS — Z5111 Encounter for antineoplastic chemotherapy: Secondary | ICD-10-CM | POA: Diagnosis not present

## 2022-06-01 DIAGNOSIS — F17210 Nicotine dependence, cigarettes, uncomplicated: Secondary | ICD-10-CM | POA: Diagnosis not present

## 2022-06-01 DIAGNOSIS — F32A Depression, unspecified: Secondary | ICD-10-CM | POA: Diagnosis not present

## 2022-06-01 DIAGNOSIS — Z85828 Personal history of other malignant neoplasm of skin: Secondary | ICD-10-CM | POA: Diagnosis not present

## 2022-06-01 DIAGNOSIS — J449 Chronic obstructive pulmonary disease, unspecified: Secondary | ICD-10-CM | POA: Diagnosis not present

## 2022-06-01 DIAGNOSIS — Z510 Encounter for antineoplastic radiation therapy: Secondary | ICD-10-CM | POA: Diagnosis not present

## 2022-06-01 DIAGNOSIS — C342 Malignant neoplasm of middle lobe, bronchus or lung: Secondary | ICD-10-CM | POA: Diagnosis not present

## 2022-06-01 DIAGNOSIS — D3502 Benign neoplasm of left adrenal gland: Secondary | ICD-10-CM | POA: Diagnosis not present

## 2022-06-01 DIAGNOSIS — Z79899 Other long term (current) drug therapy: Secondary | ICD-10-CM | POA: Diagnosis not present

## 2022-06-01 DIAGNOSIS — C3411 Malignant neoplasm of upper lobe, right bronchus or lung: Secondary | ICD-10-CM | POA: Diagnosis not present

## 2022-06-01 LAB — RAD ONC ARIA SESSION SUMMARY
Course Elapsed Days: 10
Plan Fractions Treated to Date: 9
Plan Prescribed Dose Per Fraction: 2 Gy
Plan Total Fractions Prescribed: 30
Plan Total Prescribed Dose: 60 Gy
Reference Point Dosage Given to Date: 18 Gy
Reference Point Session Dosage Given: 2 Gy
Session Number: 9

## 2022-06-05 ENCOUNTER — Other Ambulatory Visit: Payer: Self-pay

## 2022-06-05 ENCOUNTER — Ambulatory Visit
Admission: RE | Admit: 2022-06-05 | Discharge: 2022-06-05 | Disposition: A | Discharge status: Home / Self Care | Payer: Medicare Other | Source: Ambulatory Visit | Attending: Radiation Oncology | Admitting: Radiation Oncology

## 2022-06-05 DIAGNOSIS — D3502 Benign neoplasm of left adrenal gland: Secondary | ICD-10-CM | POA: Diagnosis not present

## 2022-06-05 DIAGNOSIS — Z5111 Encounter for antineoplastic chemotherapy: Secondary | ICD-10-CM | POA: Diagnosis not present

## 2022-06-05 DIAGNOSIS — C3491 Malignant neoplasm of unspecified part of right bronchus or lung: Secondary | ICD-10-CM

## 2022-06-05 DIAGNOSIS — J449 Chronic obstructive pulmonary disease, unspecified: Secondary | ICD-10-CM | POA: Diagnosis not present

## 2022-06-05 DIAGNOSIS — Z510 Encounter for antineoplastic radiation therapy: Secondary | ICD-10-CM | POA: Diagnosis not present

## 2022-06-05 DIAGNOSIS — C3411 Malignant neoplasm of upper lobe, right bronchus or lung: Secondary | ICD-10-CM | POA: Diagnosis not present

## 2022-06-05 DIAGNOSIS — Z79899 Other long term (current) drug therapy: Secondary | ICD-10-CM | POA: Diagnosis not present

## 2022-06-05 DIAGNOSIS — C342 Malignant neoplasm of middle lobe, bronchus or lung: Secondary | ICD-10-CM | POA: Diagnosis not present

## 2022-06-05 DIAGNOSIS — F17210 Nicotine dependence, cigarettes, uncomplicated: Secondary | ICD-10-CM | POA: Diagnosis not present

## 2022-06-05 DIAGNOSIS — Z801 Family history of malignant neoplasm of trachea, bronchus and lung: Secondary | ICD-10-CM | POA: Diagnosis not present

## 2022-06-05 DIAGNOSIS — Z85828 Personal history of other malignant neoplasm of skin: Secondary | ICD-10-CM | POA: Diagnosis not present

## 2022-06-05 DIAGNOSIS — F32A Depression, unspecified: Secondary | ICD-10-CM | POA: Diagnosis not present

## 2022-06-05 LAB — RAD ONC ARIA SESSION SUMMARY
Course Elapsed Days: 14
Plan Fractions Treated to Date: 10
Plan Prescribed Dose Per Fraction: 2 Gy
Plan Total Fractions Prescribed: 30
Plan Total Prescribed Dose: 60 Gy
Reference Point Dosage Given to Date: 20 Gy
Reference Point Session Dosage Given: 2 Gy
Session Number: 10

## 2022-06-06 ENCOUNTER — Other Ambulatory Visit: Payer: Self-pay

## 2022-06-06 ENCOUNTER — Ambulatory Visit
Admission: RE | Admit: 2022-06-06 | Discharge: 2022-06-06 | Disposition: A | Discharge status: Home / Self Care | Payer: Medicare Other | Source: Ambulatory Visit | Attending: Radiation Oncology | Admitting: Radiation Oncology

## 2022-06-06 DIAGNOSIS — Z79899 Other long term (current) drug therapy: Secondary | ICD-10-CM | POA: Diagnosis not present

## 2022-06-06 DIAGNOSIS — Z85828 Personal history of other malignant neoplasm of skin: Secondary | ICD-10-CM | POA: Diagnosis not present

## 2022-06-06 DIAGNOSIS — Z510 Encounter for antineoplastic radiation therapy: Secondary | ICD-10-CM | POA: Diagnosis not present

## 2022-06-06 DIAGNOSIS — F32A Depression, unspecified: Secondary | ICD-10-CM | POA: Diagnosis not present

## 2022-06-06 DIAGNOSIS — C3411 Malignant neoplasm of upper lobe, right bronchus or lung: Secondary | ICD-10-CM | POA: Diagnosis not present

## 2022-06-06 DIAGNOSIS — D3502 Benign neoplasm of left adrenal gland: Secondary | ICD-10-CM | POA: Diagnosis not present

## 2022-06-06 DIAGNOSIS — Z801 Family history of malignant neoplasm of trachea, bronchus and lung: Secondary | ICD-10-CM | POA: Diagnosis not present

## 2022-06-06 DIAGNOSIS — C342 Malignant neoplasm of middle lobe, bronchus or lung: Secondary | ICD-10-CM | POA: Diagnosis not present

## 2022-06-06 DIAGNOSIS — F17210 Nicotine dependence, cigarettes, uncomplicated: Secondary | ICD-10-CM | POA: Diagnosis not present

## 2022-06-06 DIAGNOSIS — J449 Chronic obstructive pulmonary disease, unspecified: Secondary | ICD-10-CM | POA: Diagnosis not present

## 2022-06-06 DIAGNOSIS — Z5111 Encounter for antineoplastic chemotherapy: Secondary | ICD-10-CM | POA: Diagnosis not present

## 2022-06-06 LAB — RAD ONC ARIA SESSION SUMMARY
Course Elapsed Days: 15
Plan Fractions Treated to Date: 11
Plan Prescribed Dose Per Fraction: 2 Gy
Plan Total Fractions Prescribed: 30
Plan Total Prescribed Dose: 60 Gy
Reference Point Dosage Given to Date: 22 Gy
Reference Point Session Dosage Given: 2 Gy
Session Number: 11

## 2022-06-06 NOTE — Progress Notes (Signed)
[facility] [address], [place] [address]    Clinic Day:  06/07/2022  Referring physician: Benita Stabile, MD  Patient Care Team: Benita Stabile, MD as PCP - General (Internal Medicine) Doreatha Massed, MD as Medical Oncologist (Medical Oncology) Therese Sarah, RN as Oncology Nurse Navigator (Medical Oncology)   ASSESSMENT & PLAN:   Assessment: 1.  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  No hemoptysis/weight loss. - Chest x-ray (04/10/2022): Mid right lung mass. - CT chest (04/13/2022): 9.2 x 5.7 x 6.6 cm right upper lobe mass with lobulations appearing to extend across the minor fissure into the right middle lobe and right lower lobe.  Satellite lesion in the right upper lobe measures 1.6 x 1.4 x 1.4 cm.  No findings of chest wall invasion.  Borderline prominent lower right paratracheal lymph node 0.9 cm.  Old granulomatous disease.  Small left adrenal adenoma. - Has right posterior chest wall pain for the past 6 months, dull type.  He is not requiring any pain medication. - Brain MRI (04/19/2022): No metastatic disease. - PET scan (04/19/2022): 9 x 5.7 cm right upper lobe mass with SUV 11.8.  1.3 x 0.9 cm satellite nodule in the right upper lobe with SUV 3.8.  No adenopathy or distant metastatic disease. - Bronchoscopy and biopsy by Dr. Delton Coombes - Pathology (04/23/2022): Both right upper lobe lung nodule FNA consistent with poorly differentiated carcinoma.  IHC diffusely positive for TTF-1, Napsin, synaptophysin and CK7.  CD56 negative.  P63 and CK5/6 negative.  Given Napsin positivity, favor adenocarcinoma.  There is also diffuse synaptophysin positivity consistent with neuroendocrine differentiation.  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 - Discussed with Dr. Dorris Fetch.  Not a surgical candidate as he requires pneumonectomy and has  suboptimal DLCO. - Chemoradiation with carboplatin and paclitaxel started on 05/22/2022   2.  Social/family history: - He is seen with his 2 sisters today.  Lives by himself and is independent of ADLs and IADLs.  He retired after working at [employer] for the last 34 years.  No asbestos exposure.  No other chemicals.  Smokes more than 1 pack/day for the last 50 years (started at age 46) quit smoking for 5 years before starting back again. - Brother died of lung cancer.  Father had bladder cancer.  Maternal uncle had cancer.    Plan: 1.  Stage IIIa (T4 N0 M0) adenocarcinoma of the right lung: - He has tolerated a week #2 of carboplatin and paclitaxel reasonably well. - Denied any major GI side effects. - Reviewed labs today: Normal LFTs and creatinine.  Mild hyponatremia stable.  CBC grossly normal with elevated platelet count. - Proceed with week 3 of carboplatin and paclitaxel today.  RTC 1 week for follow-up.   2.  Anxiety/depression: - Continue clonazepam 1 mg twice daily.  This is well-controlled.   3.  COPD: - He reports dyspnea in the mornings.  He has not started albuterol inhaler as he did not receive it. - We talked about smoking cessation.  He does not want to consider it at this time.  He had previously quit for 5 years with Chantix.    No orders of the defined types were placed in this encounter.     I,Katie Daubenspeck,acting as a Neurosurgeon for Doreatha Massed, MD.,have documented all relevant documentation on the behalf  of Doreatha Massed, MD,as directed by  Doreatha Massed, MD while in the presence of Doreatha Massed, MD.   I, Doreatha Massed MD, have reviewed the above documentation for accuracy and completeness, and I agree with the above.   Doreatha Massed, MD   5/30/20245:51 PM  CHIEF COMPLAINT:   Diagnosis: RUL lung adenocarcinoma    Cancer Staging  Non-small cell cancer of right lung [facility]) Staging form: Lung, AJCC 8th  Edition - Clinical stage from 05/01/2022: Stage IIIA (cT4, cN0, cM0) - Unsigned    Prior Therapy: none  Current Therapy:  concurrent chemoradiation with carboplatin + paclitaxel    HISTORY OF PRESENT ILLNESS:   Oncology History  Non-small cell cancer of right lung (HCC)  05/01/2022 Initial Diagnosis   Non-small cell cancer of right lung (HCC)   05/22/2022 -  Chemotherapy   Patient is on Treatment Plan : LUNG Carboplatin + Paclitaxel + XRT q7d        INTERVAL HISTORY:   Carlos [name] is a 67 y.o. male presenting to clinic today for follow up of RUL lung adenocarcinoma. He was last seen by me on 05/29/22.  Today, he states that he is doing well overall. His appetite level is at 100%. His energy level is at 70%.  PAST MEDICAL HISTORY:   Past Medical History: Past Medical History:  Diagnosis Date   Anxiety    Cancer (HCC)    Skin cancer left shoulder   Chronic low back pain    Complication of anesthesia    Dermatofibrosarcoma protubera of right shoulder    Dyspnea    GERD (gastroesophageal reflux disease)    Hyperlipidemia    Lung cancer (HCC) 04/23/2022   PONV (postoperative nausea and vomiting)    Port-A-Cath in place 05/17/2022   Prostate cancer [facility])     Surgical History: Past Surgical History:  Procedure Laterality Date   APPENDECTOMY     APPLICATION OF A-CELL OF EXTREMITY Right 07/09/2012   Procedure: PLACEMENT OF A-CELL TO UPPER EXTREMITY/PLACEMENT OF VAC;  Surgeon: Wayland Denis, DO;  Location: [facility];  Service: Plastics;  Laterality: Right;   BRONCHIAL BRUSHINGS  04/23/2022   Procedure: BRONCHIAL BRUSHINGS;  Surgeon: Leslye Peer, MD;  Location: [facility] ENDOSCOPY;  Service: Pulmonary;;   BRONCHIAL NEEDLE ASPIRATION BIOPSY  04/23/2022   Procedure: BRONCHIAL NEEDLE ASPIRATION BIOPSIES;  Surgeon: Leslye Peer, MD;  Location: [facility] ENDOSCOPY;  Service: Pulmonary;;   COLONOSCOPY WITH PROPOFOL N/A 09/30/2015   Procedure: COLONOSCOPY WITH PROPOFOL;  Surgeon:  Malissa Hippo, MD;  Location: [facility];  Service: Endoscopy;  Laterality: N/A;  830   EXCISION DERMATOFIBROCARCOMA PROTUBERAN RIGHT SHOULDER  2 - 3 WKS AGO   HEMOSTASIS CONTROL  04/23/2022   Procedure: HEMOSTASIS CONTROL;  Surgeon: Leslye Peer, MD;  Location: [facility] ENDOSCOPY;  Service: Pulmonary;;   INGUINAL HERNIA REPAIR Left 1999   IR IMAGING GUIDED PORT INSERTION  04/30/2022   KNEE ARTHROSCOPY Left 2012   POLYPECTOMY  09/30/2015   Procedure: POLYPECTOMY;  Surgeon: Malissa Hippo, MD;  Location: [facility];  Service: Endoscopy;;  sigmoid   VIDEO BRONCHOSCOPY WITH ENDOBRONCHIAL ULTRASOUND Bilateral 04/23/2022   Procedure: VIDEO BRONCHOSCOPY WITH ENDOBRONCHIAL ULTRASOUND;  Surgeon: Leslye Peer, MD;  Location: [facility] ENDOSCOPY;  Service: Pulmonary;  Laterality: Bilateral;   VIDEO BRONCHOSCOPY WITH RADIAL ENDOBRONCHIAL ULTRASOUND  04/23/2022   Procedure: VIDEO BRONCHOSCOPY WITH RADIAL ENDOBRONCHIAL ULTRASOUND;  Surgeon: Leslye Peer, MD;  Location: [facility] ENDOSCOPY;  Service: Pulmonary;;    Social  History: Social History   Socioeconomic History   Marital status: Divorced    Spouse name: Burna Mortimer   Number of children: 1   Years of education: 10th   Highest education level: Not on file  Occupational History    Employer: [employer]    Comment: [employer]  Tobacco Use   Smoking status: Every Day    Packs/day: 1.00    Years: 40.00    Additional pack years: 0.00    Total pack years: 40.00    Types: Cigarettes   Smokeless tobacco: Never  Vaping Use   Vaping Use: Never used  Substance and Sexual Activity   Alcohol use: Yes    Comment: 2-3 cans beer/day   Drug use: No   Sexual activity: Not on file  Other Topics Concern   Not on file  Social History Narrative   Patient lives at home with his dog. 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 Patient has 10 grade education.Patient is right-handed.Caffeine Use: 1 cup of coffee; 3-4 sodas  daily   Social Determinants of Health   Financial Resource Strain: High Risk (05/10/2022)   Overall Financial Resource Strain (CARDIA)    Difficulty of Paying Living Expenses: Very hard  Food Insecurity: Food Insecurity Present (05/04/2022)   Hunger Vital Sign    Worried About Running Out of Food in the Last Year: Sometimes true    Ran Out of Food in the Last Year: Sometimes true  Transportation Needs: No Transportation Needs (05/04/2022)   PRAPARE - Administrator, Civil Service (Medical): No    Lack of Transportation (Non-Medical): No  Physical Activity: Not on file  Stress: Not on file  Social Connections: Not on file  Intimate Partner Violence: Not At Risk (05/04/2022)   Humiliation, Afraid, Rape, and Kick questionnaire    Fear of Current or Ex-Partner: No    Emotionally Abused: No    Physically Abused: No    Sexually Abused: No    Family History: Family History  Problem Relation Age of Onset   Dementia Mother    Cancer Father     Current Medications:  Current Outpatient Medications:    acetaminophen (TYLENOL) 500 MG tablet, Take 1,000 mg by mouth every 6 (six) hours as needed (pain.)., Disp: , Rfl:    albuterol (VENTOLIN HFA) 108 (90 Base) MCG/ACT inhaler, Inhale 2 puffs into the lungs every 6 (six) hours as needed for wheezing or shortness of breath., Disp: 8 g, Rfl: 6   aspirin EC 81 MG tablet, Take 81 mg by mouth at bedtime. Swallow whole., Disp: , Rfl:    CARBOPLATIN IV, Inject into the vein once a week., Disp: , Rfl:    clonazePAM (KLONOPIN) 1 MG disintegrating tablet, Take one tablet by mouth every morning and two tablets by mouth at bedtime, Disp: 90 tablet, Rfl: 2   lidocaine-prilocaine (EMLA) cream, Apply a quarter-sized amount to port a cath site and cover with plastic wrap 1 hour prior to infusion appointments, Disp: 30 g, Rfl: 3   meloxicam (MOBIC) 15 MG tablet, Take 15 mg by mouth in the morning., Disp: , Rfl:    PACLitaxel (TAXOL IV), Inject into  the vein once a week., Disp: , Rfl:    pantoprazole (PROTONIX) 40 MG tablet, Take 40 mg by mouth daily., Disp: , Rfl:    prochlorperazine (COMPAZINE) 10 MG tablet, Take 1 tablet (10 mg total) by mouth every 6 (six) hours as needed for nausea  or vomiting., Disp: 60 tablet, Rfl: 3   simvastatin (ZOCOR) 20 MG tablet, Take 20 mg by mouth at bedtime., Disp: , Rfl: 2   tamsulosin (FLOMAX) 0.4 MG CAPS capsule, Take 1 capsule (0.4 mg total) by mouth daily. (Patient taking differently: Take 0.4 mg by mouth at bedtime.), Disp: 30 capsule, Rfl: 11   Allergies: Allergies  Allergen Reactions   Ciprofloxacin Swelling and Other (See Comments)    Swelling in hands, numbness in arms and joint pain   Gabapentin Other (See Comments)    Double vision    REVIEW OF SYSTEMS:   Review of Systems  Constitutional:  Negative for chills, fatigue and fever.  HENT:   Negative for lump/mass, mouth sores, nosebleeds, sore throat and trouble swallowing.   Eyes:  Negative for eye problems.  Respiratory:  Positive for cough and shortness of breath.   Cardiovascular:  Negative for chest pain, leg swelling and palpitations.  Gastrointestinal:  Negative for abdominal pain, constipation, diarrhea, nausea and vomiting.  Genitourinary:  Negative for bladder incontinence, difficulty urinating, dysuria, frequency, hematuria and nocturia.   Musculoskeletal:  Negative for arthralgias, back pain, flank pain, myalgias and neck pain.  Skin:  Negative for itching and rash.  Neurological:  Positive for dizziness. Negative for headaches and numbness.  Hematological:  Does not bruise/bleed easily.  Psychiatric/Behavioral:  Positive for sleep disturbance. Negative for depression and suicidal ideas. The patient is not nervous/anxious.   All other systems reviewed and are negative.    VITALS:   Blood pressure 134/80, pulse 94, temperature (!) 97.5 F (36.4 C), temperature source Tympanic, resp. rate 18, weight 183 lb (83 kg), SpO2 99  %.  Wt Readings from Last 3 Encounters:  06/07/22 183 lb (83 kg)  05/29/22 184 lb 9.6 oz (83.7 kg)  05/22/22 181 lb 11.2 oz (82.4 kg)    Body mass index is 29.54 kg/m.  Performance status (ECOG): 1 - Symptomatic but completely ambulatory  PHYSICAL EXAM:   Physical Exam Vitals and nursing note reviewed. Exam conducted with a chaperone present.  Constitutional:      Appearance: Normal appearance.  Cardiovascular:     Rate and Rhythm: Normal rate and regular rhythm.     Pulses: Normal pulses.     Heart sounds: Normal heart sounds.  Pulmonary:     Effort: Pulmonary effort is normal.     Breath sounds: Normal breath sounds.  Abdominal:     Palpations: Abdomen is soft. There is no hepatomegaly, splenomegaly or mass.     Tenderness: There is no abdominal tenderness.  Musculoskeletal:     Right lower leg: No edema.     Left lower leg: No edema.  Lymphadenopathy:     Cervical: No cervical adenopathy.     Right cervical: No superficial, deep or posterior cervical adenopathy.    Left cervical: No superficial, deep or posterior cervical adenopathy.     Upper Body:     Right upper body: No supraclavicular or axillary adenopathy.     Left upper body: No supraclavicular or axillary adenopathy.  Neurological:     General: No focal deficit present.     Mental Status: He is alert and oriented to person, place, and time.  Psychiatric:        Mood and Affect: Mood normal.        Behavior: Behavior normal.     LABS:      Latest Ref Rng & Units 06/07/2022    8:01 AM 05/29/2022  9:40 AM 05/22/2022    8:06 AM  CBC  WBC 4.0 - 10.5 K/uL 6.8  6.5  10.7   Hemoglobin 13.0 - 17.0 g/dL 16.1  09.6  04.5   Hematocrit 39.0 - 52.0 % 41.5  40.8  44.3   Platelets 150 - 400 K/uL 437  381  290       Latest Ref Rng & Units 06/07/2022    8:01 AM 05/29/2022    9:40 AM 05/22/2022    8:06 AM  CMP  Glucose 70 - 99 mg/dL 409  811  914   BUN 8 - 23 mg/dL 12  15  10    Creatinine 0.61 - 1.24 mg/dL 7.82   9.56  2.13   Sodium 135 - 145 mmol/L 132  133  132   Potassium 3.5 - 5.1 mmol/L 3.9  3.7  3.7   Chloride 98 - 111 mmol/L 101  102  99   CO2 22 - 32 mmol/L 22  23  24    Calcium 8.9 - 10.3 mg/dL 8.9  8.6  9.1   Total Protein 6.5 - 8.1 g/dL 7.2  7.2  7.4   Total Bilirubin 0.3 - 1.2 mg/dL 0.4  0.5  0.8   Alkaline Phos 38 - 126 U/L 60  62  80   AST 15 - 41 U/L 25  27  71   ALT 0 - 44 U/L 26  31  55      No results found for: "CEA1", "CEA" / No results found for: "CEA1", "CEA" Lab Results  Component Value Date   PSA1 4.7 (H) 04/12/2022   No results found for: "YQM578" No results found for: "CAN125"  No results found for: "TOTALPROTELP", "ALBUMINELP", "A1GS", "A2GS", "BETS", "BETA2SER", "GAMS", "MSPIKE", "SPEI" No results found for: "TIBC", "FERRITIN", "IRONPCTSAT" No results found for: "LDH"   STUDIES:   No results found.

## 2022-06-07 ENCOUNTER — Inpatient Hospital Stay: Payer: Medicare Other

## 2022-06-07 ENCOUNTER — Other Ambulatory Visit: Payer: Self-pay

## 2022-06-07 ENCOUNTER — Ambulatory Visit
Admission: RE | Admit: 2022-06-07 | Discharge: 2022-06-07 | Disposition: A | Discharge status: Home / Self Care | Payer: Medicare Other | Source: Ambulatory Visit | Attending: Radiation Oncology | Admitting: Radiation Oncology

## 2022-06-07 ENCOUNTER — Inpatient Hospital Stay: Payer: Medicare Other | Admitting: Hematology

## 2022-06-07 VITALS — BP 143/83 | HR 85 | Temp 97.9°F | Resp 18

## 2022-06-07 DIAGNOSIS — C3411 Malignant neoplasm of upper lobe, right bronchus or lung: Secondary | ICD-10-CM | POA: Diagnosis not present

## 2022-06-07 DIAGNOSIS — Z79899 Other long term (current) drug therapy: Secondary | ICD-10-CM | POA: Diagnosis not present

## 2022-06-07 DIAGNOSIS — C342 Malignant neoplasm of middle lobe, bronchus or lung: Secondary | ICD-10-CM | POA: Diagnosis not present

## 2022-06-07 DIAGNOSIS — C3491 Malignant neoplasm of unspecified part of right bronchus or lung: Secondary | ICD-10-CM | POA: Diagnosis not present

## 2022-06-07 DIAGNOSIS — Z510 Encounter for antineoplastic radiation therapy: Secondary | ICD-10-CM | POA: Diagnosis not present

## 2022-06-07 DIAGNOSIS — Z5111 Encounter for antineoplastic chemotherapy: Secondary | ICD-10-CM | POA: Diagnosis not present

## 2022-06-07 DIAGNOSIS — Z95828 Presence of other vascular implants and grafts: Secondary | ICD-10-CM

## 2022-06-07 DIAGNOSIS — F17210 Nicotine dependence, cigarettes, uncomplicated: Secondary | ICD-10-CM | POA: Diagnosis not present

## 2022-06-07 DIAGNOSIS — D3502 Benign neoplasm of left adrenal gland: Secondary | ICD-10-CM | POA: Diagnosis not present

## 2022-06-07 DIAGNOSIS — Z801 Family history of malignant neoplasm of trachea, bronchus and lung: Secondary | ICD-10-CM | POA: Diagnosis not present

## 2022-06-07 DIAGNOSIS — Z85828 Personal history of other malignant neoplasm of skin: Secondary | ICD-10-CM | POA: Diagnosis not present

## 2022-06-07 DIAGNOSIS — F32A Depression, unspecified: Secondary | ICD-10-CM | POA: Diagnosis not present

## 2022-06-07 DIAGNOSIS — J449 Chronic obstructive pulmonary disease, unspecified: Secondary | ICD-10-CM | POA: Diagnosis not present

## 2022-06-07 LAB — CBC WITH DIFFERENTIAL/PLATELET
Abs Immature Granulocytes: 0.03 10*3/uL (ref 0.00–0.07)
Basophils Absolute: 0.1 10*3/uL (ref 0.0–0.1)
Basophils Relative: 1 %
Eosinophils Absolute: 0.2 10*3/uL (ref 0.0–0.5)
Eosinophils Relative: 2 %
HCT: 41.5 % (ref 39.0–52.0)
Hemoglobin: 14 g/dL (ref 13.0–17.0)
Immature Granulocytes: 0 %
Lymphocytes Relative: 7 %
Lymphs Abs: 0.5 10*3/uL — ABNORMAL LOW (ref 0.7–4.0)
MCH: 31.1 pg (ref 26.0–34.0)
MCHC: 33.7 g/dL (ref 30.0–36.0)
MCV: 92.2 fL (ref 80.0–100.0)
Monocytes Absolute: 0.8 10*3/uL (ref 0.1–1.0)
Monocytes Relative: 12 %
Neutro Abs: 5.3 10*3/uL (ref 1.7–7.7)
Neutrophils Relative %: 78 %
Platelets: 437 10*3/uL — ABNORMAL HIGH (ref 150–400)
RBC: 4.5 MIL/uL (ref 4.22–5.81)
RDW: 14.6 % (ref 11.5–15.5)
WBC: 6.8 10*3/uL (ref 4.0–10.5)
nRBC: 0 % (ref 0.0–0.2)

## 2022-06-07 LAB — RAD ONC ARIA SESSION SUMMARY
Course Elapsed Days: 16
Plan Fractions Treated to Date: 12
Plan Prescribed Dose Per Fraction: 2 Gy
Plan Total Fractions Prescribed: 30
Plan Total Prescribed Dose: 60 Gy
Reference Point Dosage Given to Date: 24 Gy
Reference Point Session Dosage Given: 2 Gy
Session Number: 12

## 2022-06-07 LAB — COMPREHENSIVE METABOLIC PANEL
ALT: 26 U/L (ref 0–44)
AST: 25 U/L (ref 15–41)
Albumin: 3.8 g/dL (ref 3.5–5.0)
Alkaline Phosphatase: 60 U/L (ref 38–126)
Anion gap: 9 (ref 5–15)
BUN: 12 mg/dL (ref 8–23)
CO2: 22 mmol/L (ref 22–32)
Calcium: 8.9 mg/dL (ref 8.9–10.3)
Chloride: 101 mmol/L (ref 98–111)
Creatinine, Ser: 0.72 mg/dL (ref 0.61–1.24)
GFR, Estimated: >60 mL/min (ref >60)
Glucose, Bld: 127 mg/dL — ABNORMAL HIGH (ref 70–99)
Potassium: 3.9 mmol/L (ref 3.5–5.1)
Sodium: 132 mmol/L — ABNORMAL LOW (ref 135–145)
Total Bilirubin: 0.4 mg/dL (ref 0.3–1.2)
Total Protein: 7.2 g/dL (ref 6.5–8.1)

## 2022-06-07 LAB — MAGNESIUM: Magnesium: 1.9 mg/dL (ref 1.7–2.4)

## 2022-06-07 MED ORDER — SODIUM CHLORIDE 0.9 % IV SOLN
45.0000 mg/m2 | Freq: Once | INTRAVENOUS | Status: AC
  Administered 2022-06-07: 90 mg via INTRAVENOUS
  Filled 2022-06-07: qty 15

## 2022-06-07 MED ORDER — HEPARIN SOD (PORK) LOCK FLUSH 100 UNIT/ML IV SOLN
500.0000 [IU] | Freq: Once | INTRAVENOUS | Status: AC | PRN
  Administered 2022-06-07: 500 [IU]

## 2022-06-07 MED ORDER — PALONOSETRON HCL INJECTION 0.25 MG/5ML
0.2500 mg | Freq: Once | INTRAVENOUS | Status: AC
  Administered 2022-06-07: 0.25 mg via INTRAVENOUS
  Filled 2022-06-07: qty 5

## 2022-06-07 MED ORDER — SODIUM CHLORIDE 0.9 % IV SOLN
220.0000 mg | Freq: Once | INTRAVENOUS | Status: AC
  Administered 2022-06-07: 220 mg via INTRAVENOUS
  Filled 2022-06-07: qty 22

## 2022-06-07 MED ORDER — SODIUM CHLORIDE 0.9% FLUSH
10.0000 mL | Freq: Once | INTRAVENOUS | Status: AC
  Administered 2022-06-07: 10 mL via INTRAVENOUS

## 2022-06-07 MED ORDER — CETIRIZINE HCL 10 MG/ML IV SOLN
10.0000 mg | Freq: Once | INTRAVENOUS | Status: AC
  Administered 2022-06-07: 10 mg via INTRAVENOUS
  Filled 2022-06-07: qty 1

## 2022-06-07 MED ORDER — SODIUM CHLORIDE 0.9% FLUSH
10.0000 mL | INTRAVENOUS | Status: DC | PRN
  Administered 2022-06-07: 10 mL

## 2022-06-07 MED ORDER — FAMOTIDINE IN NACL 20-0.9 MG/50ML-% IV SOLN
20.0000 mg | Freq: Once | INTRAVENOUS | Status: AC
  Administered 2022-06-07: 20 mg via INTRAVENOUS
  Filled 2022-06-07: qty 50

## 2022-06-07 MED ORDER — SODIUM CHLORIDE 0.9 % IV SOLN
10.0000 mg | Freq: Once | INTRAVENOUS | Status: AC
  Administered 2022-06-07: 10 mg via INTRAVENOUS
  Filled 2022-06-07: qty 10

## 2022-06-07 MED ORDER — SODIUM CHLORIDE 0.9 % IV SOLN: Freq: Once | INTRAVENOUS | Status: AC

## 2022-06-07 NOTE — Patient Instructions (Signed)
[facility]  Discharge Instructions: Thank you for choosing [facility] to provide your oncology and hematology care.  If you have a lab appointment with the Cancer Center - please note that after April 8th, 2024, all labs will be drawn in the cancer center.  You do not have to check in or register with the main entrance as you have in the past but will complete your check-in in the cancer center.  Wear comfortable clothing and clothing appropriate for easy access to any Portacath or PICC line.   We strive to give you quality time with your provider. You may need to reschedule your appointment if you arrive late (15 or more minutes).  Arriving late affects you and other patients whose appointments are after yours.  Also, if you miss three or more appointments without notifying the office, you may be dismissed from the clinic at the provider's discretion.      For prescription refill requests, have your pharmacy contact our office and allow 72 hours for refills to be completed.    Today you received the following chemotherapy and/or immunotherapy agents Taxol, carboplatin. Carboplatin Injection What is this medication? CARBOPLATIN (KAR boe pla tin) treats some types of cancer. It works by slowing down the growth of cancer cells. This medicine may be used for other purposes; ask your health care provider or pharmacist if you have questions. COMMON BRAND NAME(S): Paraplatin What should I tell my care team before I take this medication? They need to know if you have any of these conditions: Blood disorders Hearing problems Kidney disease Recent or ongoing radiation therapy An unusual or allergic reaction to carboplatin, cisplatin, other medications, foods, dyes, or preservatives Pregnant or trying to get pregnant Breast-feeding How should I use this medication? This medication is injected into a vein. It is given by your care team in a hospital or clinic  setting. Talk to your care team about the use of this medication in children. Special care may be needed. Overdosage: If you think you have taken too much of this medicine contact a poison control center or emergency room at once. NOTE: This medicine is only for you. Do not share this medicine with others. What if I miss a dose? Keep appointments for follow-up doses. It is important not to miss your dose. Call your care team if you are unable to keep an appointment. What may interact with this medication? Medications for seizures Some antibiotics, such as amikacin, gentamicin, neomycin, streptomycin, tobramycin Vaccines This list may not describe all possible interactions. Give your health care provider a list of all the medicines, herbs, non-prescription drugs, or dietary supplements you use. Also tell them if you smoke, drink alcohol, or use illegal drugs. Some items may interact with your medicine. What should I watch for while using this medication? Your condition will be monitored carefully while you are receiving this medication. You may need blood work while taking this medication. This medication may make you feel generally unwell. This is not uncommon, as chemotherapy can affect healthy cells as well as cancer cells. Report any side effects. Continue your course of treatment even though you feel ill unless your care team tells you to stop. In some cases, you may be given additional medications to help with side effects. Follow all directions for their use. This medication may increase your risk of getting an infection. Call your care team for advice if you get a fever, chills, sore throat, or other  symptoms of a cold or flu. Do not treat yourself. Try to avoid being around people who are sick. Avoid taking medications that contain aspirin, acetaminophen, ibuprofen, naproxen, or ketoprofen unless instructed by your care team. These medications may hide a fever. Be careful brushing or  flossing your teeth or using a toothpick because you may get an infection or bleed more easily. If you have any dental work done, tell your dentist you are receiving this medication. Talk to your care team if you wish to become pregnant or think you might be pregnant. This medication can cause serious birth defects. Talk to your care team about effective forms of contraception. Do not breast-feed while taking this medication. What side effects may I notice from receiving this medication? Side effects that you should report to your care team as soon as possible: Allergic reactions--skin rash, itching, hives, swelling of the face, lips, tongue, or throat Infection--fever, chills, cough, sore throat, wounds that don't heal, pain or trouble when passing urine, general feeling of discomfort or being unwell Low red blood cell level--unusual weakness or fatigue, dizziness, headache, trouble breathing Pain, tingling, or numbness in the hands or feet, muscle weakness, change in vision, confusion or trouble speaking, loss of balance or coordination, trouble walking, seizures Unusual bruising or bleeding Side effects that usually do not require medical attention (report to your care team if they continue or are bothersome): Hair loss Nausea Unusual weakness or fatigue Vomiting This list may not describe all possible side effects. Call your doctor for medical advice about side effects. You may report side effects to FDA at 1-800-FDA-1088. Where should I keep my medication? This medication is given in a hospital or clinic. It will not be stored at home. NOTE: This sheet is a summary. It may not cover all possible information. If you have questions about this medicine, talk to your doctor, pharmacist, or health care provider.  2024 Elsevier/Gold Standard (2021-04-18 00:00:00) Paclitaxel Injection What is this medication? PACLITAXEL (PAK li TAX el) treats some types of cancer. It works by slowing down the  growth of cancer cells. This medicine may be used for other purposes; ask your health care provider or pharmacist if you have questions. COMMON BRAND NAME(S): Onxol, Taxol What should I tell my care team before I take this medication? They need to know if you have any of these conditions: Heart disease Liver disease Low white blood cell levels An unusual or allergic reaction to paclitaxel, other medications, foods, dyes, or preservatives If you or your partner are pregnant or trying to get pregnant Breast-feeding How should I use this medication? This medication is injected into a vein. It is given by your care team in a hospital or clinic setting. Talk to your care team about the use of this medication in children. While it may be given to children for selected conditions, precautions do apply. Overdosage: If you think you have taken too much of this medicine contact a poison control center or emergency room at once. NOTE: This medicine is only for you. Do not share this medicine with others. What if I miss a dose? Keep appointments for follow-up doses. It is important not to miss your dose. Call your care team if you are unable to keep an appointment. What may interact with this medication? Do not take this medication with any of the following: Live virus vaccines Other medications may affect the way this medication works. Talk with your care team about all of the medications you  take. They may suggest changes to your treatment plan to lower the risk of side effects and to make sure your medications work as intended. This list may not describe all possible interactions. Give your health care provider a list of all the medicines, herbs, non-prescription drugs, or dietary supplements you use. Also tell them if you smoke, drink alcohol, or use illegal drugs. Some items may interact with your medicine. What should I watch for while using this medication? Your condition will be monitored  carefully while you are receiving this medication. You may need blood work while taking this medication. This medication may make you feel generally unwell. This is not uncommon as chemotherapy can affect healthy cells as well as cancer cells. Report any side effects. Continue your course of treatment even though you feel ill unless your care team tells you to stop. This medication can cause serious allergic reactions. To reduce the risk, your care team may give you other medications to take before receiving this one. Be sure to follow the directions from your care team. This medication may increase your risk of getting an infection. Call your care team for advice if you get a fever, chills, sore throat, or other symptoms of a cold or flu. Do not treat yourself. Try to avoid being around people who are sick. This medication may increase your risk to bruise or bleed. Call your care team if you notice any unusual bleeding. Be careful brushing or flossing your teeth or using a toothpick because you may get an infection or bleed more easily. If you have any dental work done, tell your dentist you are receiving this medication. Talk to your care team if you may be pregnant. Serious birth defects can occur if you take this medication during pregnancy. Talk to your care team before breastfeeding. Changes to your treatment plan may be needed. What side effects may I notice from receiving this medication? Side effects that you should report to your care team as soon as possible: Allergic reactions--skin rash, itching, hives, swelling of the face, lips, tongue, or throat Heart rhythm changes--fast or irregular heartbeat, dizziness, feeling faint or lightheaded, chest pain, trouble breathing Increase in blood pressure Infection--fever, chills, cough, sore throat, wounds that don't heal, pain or trouble when passing urine, general feeling of discomfort or being unwell Low blood pressure--dizziness, feeling faint  or lightheaded, blurry vision Low red blood cell level--unusual weakness or fatigue, dizziness, headache, trouble breathing Painful swelling, warmth, or redness of the skin, blisters or sores at the infusion site Pain, tingling, or numbness in the hands or feet Slow heartbeat--dizziness, feeling faint or lightheaded, confusion, trouble breathing, unusual weakness or fatigue Unusual bruising or bleeding Side effects that usually do not require medical attention (report to your care team if they continue or are bothersome): Diarrhea Hair loss Joint pain Loss of appetite Muscle pain Nausea Vomiting This list may not describe all possible side effects. Call your doctor for medical advice about side effects. You may report side effects to FDA at 1-800-FDA-1088. Where should I keep my medication? This medication is given in a hospital or clinic. It will not be stored at home. NOTE: This sheet is a summary. It may not cover all possible information. If you have questions about this medicine, talk to your doctor, pharmacist, or health care provider.  2024 Elsevier/Gold Standard (2021-05-16 00:00:00)       To help prevent nausea and vomiting after your treatment, we encourage you to take your nausea medication  as directed.  BELOW ARE SYMPTOMS THAT SHOULD BE REPORTED IMMEDIATELY: *FEVER GREATER THAN 100.4 F (38 C) OR HIGHER *CHILLS OR SWEATING *NAUSEA AND VOMITING THAT IS NOT CONTROLLED WITH YOUR NAUSEA MEDICATION *UNUSUAL SHORTNESS OF BREATH *UNUSUAL BRUISING OR BLEEDING *URINARY PROBLEMS (pain or burning when urinating, or frequent urination) *BOWEL PROBLEMS (unusual diarrhea, constipation, pain near the anus) TENDERNESS IN MOUTH AND THROAT WITH OR WITHOUT PRESENCE OF ULCERS (sore throat, sores in mouth, or a toothache) UNUSUAL RASH, SWELLING OR PAIN  UNUSUAL VAGINAL DISCHARGE OR ITCHING   Items with * indicate a potential emergency and should be followed up as soon as possible or go to  the Emergency Department if any problems should occur.  Please show the CHEMOTHERAPY ALERT CARD or IMMUNOTHERAPY ALERT CARD at check-in to the Emergency Department and triage nurse.  Should you have questions after your visit or need to cancel or reschedule your appointment, please contact [facility]ENTER AT [facility] [phone]  and follow the prompts.  Office hours are 8:00 a.m. to 4:30 p.m. Monday - Friday. Please note that voicemails left after 4:00 p.m. may not be returned until the following business day.  We are closed weekends and major holidays. You have access to a nurse at all times for urgent questions. Please call the main number to the clinic [phone] and follow the prompts.  For any non-urgent questions, you may also contact your provider using MyChart. We now offer e-Visits for anyone 62 and older to request care online for non-urgent symptoms. For details visit [email or web address].   Also download the MyChart app! Go to the app store, search "MyChart", open the app, select [facility], and log in with your MyChart username and password.

## 2022-06-07 NOTE — Progress Notes (Signed)
Patient presents today for Taxol and Carboplatin D1C3 with follow up visit with Dr. Ellin Saba. Labs and vital signs within parameters for treatment.   Message received from A. Dareen Piano RN / Dr.Katragadda to proceed with today's treatment.   Treatment given today per MD orders. Tolerated infusion without adverse affects. Vital signs stable. No complaints at this time. Discharged from clinic ambulatory in stable condition. Alert and oriented x 3. F/U with [facility] as scheduled.

## 2022-06-07 NOTE — Progress Notes (Signed)
Patient has been examined by Dr. Katragadda. Vital signs and labs have been reviewed by MD - ANC, Creatinine, LFTs, hemoglobin, and platelets are within treatment parameters per M.D. - pt may proceed with treatment.  Primary RN and pharmacy notified.  

## 2022-06-07 NOTE — Patient Instructions (Signed)
[facility] at [facility] Discharge Instructions   You were seen and examined today by Dr. Katragadda.  He reviewed the results of your lab work which are normal/stable.   We will proceed with your treatment today.  Return as scheduled.    Thank you for choosing [facility] at [facility] to provide your oncology and hematology care.  To afford each patient quality time with our provider, please arrive at least 15 minutes before your scheduled appointment time.   If you have a lab appointment with the Cancer Center please come in thru the Main Entrance and check in at the main information desk.  You need to re-schedule your appointment should you arrive 10 or more minutes late.  We strive to give you quality time with our providers, and arriving late affects you and other patients whose appointments are after yours.  Also, if you no show three or more times for appointments you may be dismissed from the clinic at the providers discretion.     Again, thank you for choosing [facility].  Our hope is that these requests will decrease the amount of time that you wait before being seen by our physicians.       _____________________________________________________________  Should you have questions after your visit to [facility], please contact our office at [phone] and follow the prompts.  Our office hours are 8:00 a.m. and 4:30 p.m. Monday - Friday.  Please note that voicemails left after 4:00 p.m. may not be returned until the following business day.  We are closed weekends and major holidays.  You do have access to a nurse 24-7, just call the main number to the clinic [phone] and do not press any options, hold on the line and a nurse will answer the phone.    For prescription refill requests, have your pharmacy contact our office and allow 72 hours.    Due to Covid, you will need to wear a mask upon entering  the hospital. If you do not have a mask, a mask will be given to you at the Main Entrance upon arrival. For doctor visits, patients may have 1 support person age 18 or older with them. For treatment visits, patients can not have anyone with them due to social distancing guidelines and our immunocompromised population.      

## 2022-06-08 ENCOUNTER — Other Ambulatory Visit: Payer: Self-pay

## 2022-06-08 ENCOUNTER — Ambulatory Visit
Admission: RE | Admit: 2022-06-08 | Discharge: 2022-06-08 | Disposition: A | Discharge status: Home / Self Care | Payer: Medicare Other | Source: Ambulatory Visit | Attending: Radiation Oncology | Admitting: Radiation Oncology

## 2022-06-08 DIAGNOSIS — Z801 Family history of malignant neoplasm of trachea, bronchus and lung: Secondary | ICD-10-CM | POA: Diagnosis not present

## 2022-06-08 DIAGNOSIS — D3502 Benign neoplasm of left adrenal gland: Secondary | ICD-10-CM | POA: Diagnosis not present

## 2022-06-08 DIAGNOSIS — Z85828 Personal history of other malignant neoplasm of skin: Secondary | ICD-10-CM | POA: Diagnosis not present

## 2022-06-08 DIAGNOSIS — J449 Chronic obstructive pulmonary disease, unspecified: Secondary | ICD-10-CM | POA: Diagnosis not present

## 2022-06-08 DIAGNOSIS — Z5111 Encounter for antineoplastic chemotherapy: Secondary | ICD-10-CM | POA: Diagnosis not present

## 2022-06-08 DIAGNOSIS — C342 Malignant neoplasm of middle lobe, bronchus or lung: Secondary | ICD-10-CM | POA: Diagnosis not present

## 2022-06-08 DIAGNOSIS — Z79899 Other long term (current) drug therapy: Secondary | ICD-10-CM | POA: Diagnosis not present

## 2022-06-08 DIAGNOSIS — F32A Depression, unspecified: Secondary | ICD-10-CM | POA: Diagnosis not present

## 2022-06-08 DIAGNOSIS — Z510 Encounter for antineoplastic radiation therapy: Secondary | ICD-10-CM | POA: Diagnosis not present

## 2022-06-08 DIAGNOSIS — F17210 Nicotine dependence, cigarettes, uncomplicated: Secondary | ICD-10-CM | POA: Diagnosis not present

## 2022-06-08 DIAGNOSIS — C3411 Malignant neoplasm of upper lobe, right bronchus or lung: Secondary | ICD-10-CM | POA: Diagnosis not present

## 2022-06-08 LAB — RAD ONC ARIA SESSION SUMMARY
Course Elapsed Days: 17
Plan Fractions Treated to Date: 13
Plan Prescribed Dose Per Fraction: 2 Gy
Plan Total Fractions Prescribed: 30
Plan Total Prescribed Dose: 60 Gy
Reference Point Dosage Given to Date: 26 Gy
Reference Point Session Dosage Given: 2 Gy
Session Number: 13

## 2022-06-11 ENCOUNTER — Other Ambulatory Visit: Payer: Self-pay

## 2022-06-11 ENCOUNTER — Ambulatory Visit
Admission: RE | Admit: 2022-06-11 | Discharge: 2022-06-11 | Disposition: A | Discharge status: Home / Self Care | Payer: Medicare Other | Source: Ambulatory Visit | Attending: Radiation Oncology | Admitting: Radiation Oncology

## 2022-06-11 DIAGNOSIS — F17210 Nicotine dependence, cigarettes, uncomplicated: Secondary | ICD-10-CM | POA: Diagnosis not present

## 2022-06-11 DIAGNOSIS — C3411 Malignant neoplasm of upper lobe, right bronchus or lung: Secondary | ICD-10-CM | POA: Diagnosis not present

## 2022-06-11 DIAGNOSIS — Z79899 Other long term (current) drug therapy: Secondary | ICD-10-CM | POA: Insufficient documentation

## 2022-06-11 DIAGNOSIS — Z5111 Encounter for antineoplastic chemotherapy: Secondary | ICD-10-CM | POA: Diagnosis not present

## 2022-06-11 DIAGNOSIS — Z510 Encounter for antineoplastic radiation therapy: Secondary | ICD-10-CM | POA: Diagnosis not present

## 2022-06-11 DIAGNOSIS — D3502 Benign neoplasm of left adrenal gland: Secondary | ICD-10-CM | POA: Diagnosis not present

## 2022-06-11 DIAGNOSIS — C3491 Malignant neoplasm of unspecified part of right bronchus or lung: Secondary | ICD-10-CM | POA: Insufficient documentation

## 2022-06-11 LAB — RAD ONC ARIA SESSION SUMMARY
Course Elapsed Days: 20
Plan Fractions Treated to Date: 14
Plan Prescribed Dose Per Fraction: 2 Gy
Plan Total Fractions Prescribed: 30
Plan Total Prescribed Dose: 60 Gy
Reference Point Dosage Given to Date: 28 Gy
Reference Point Session Dosage Given: 0.1826 Gy
Session Number: 14

## 2022-06-12 ENCOUNTER — Other Ambulatory Visit: Payer: Self-pay

## 2022-06-12 ENCOUNTER — Ambulatory Visit
Admission: RE | Admit: 2022-06-12 | Discharge: 2022-06-12 | Disposition: A | Discharge status: Home / Self Care | Payer: Medicare Other | Source: Ambulatory Visit | Attending: Radiation Oncology | Admitting: Radiation Oncology

## 2022-06-12 DIAGNOSIS — Z510 Encounter for antineoplastic radiation therapy: Secondary | ICD-10-CM | POA: Diagnosis not present

## 2022-06-12 DIAGNOSIS — C3411 Malignant neoplasm of upper lobe, right bronchus or lung: Secondary | ICD-10-CM | POA: Diagnosis not present

## 2022-06-12 DIAGNOSIS — Z79899 Other long term (current) drug therapy: Secondary | ICD-10-CM | POA: Diagnosis not present

## 2022-06-12 DIAGNOSIS — F17210 Nicotine dependence, cigarettes, uncomplicated: Secondary | ICD-10-CM | POA: Diagnosis not present

## 2022-06-12 DIAGNOSIS — D3502 Benign neoplasm of left adrenal gland: Secondary | ICD-10-CM | POA: Diagnosis not present

## 2022-06-12 DIAGNOSIS — Z5111 Encounter for antineoplastic chemotherapy: Secondary | ICD-10-CM | POA: Diagnosis not present

## 2022-06-12 LAB — RAD ONC ARIA SESSION SUMMARY
Course Elapsed Days: 21
Plan Fractions Treated to Date: 15
Plan Prescribed Dose Per Fraction: 2 Gy
Plan Total Fractions Prescribed: 30
Plan Total Prescribed Dose: 60 Gy
Reference Point Dosage Given to Date: 30 Gy
Reference Point Session Dosage Given: 2 Gy
Session Number: 15

## 2022-06-13 ENCOUNTER — Other Ambulatory Visit: Payer: Self-pay

## 2022-06-13 ENCOUNTER — Ambulatory Visit
Admission: RE | Admit: 2022-06-13 | Discharge: 2022-06-13 | Disposition: A | Discharge status: Home / Self Care | Payer: Medicare Other | Source: Ambulatory Visit | Attending: Radiation Oncology | Admitting: Radiation Oncology

## 2022-06-13 DIAGNOSIS — Z5111 Encounter for antineoplastic chemotherapy: Secondary | ICD-10-CM | POA: Diagnosis not present

## 2022-06-13 DIAGNOSIS — Z79899 Other long term (current) drug therapy: Secondary | ICD-10-CM | POA: Diagnosis not present

## 2022-06-13 DIAGNOSIS — Z510 Encounter for antineoplastic radiation therapy: Secondary | ICD-10-CM | POA: Diagnosis not present

## 2022-06-13 DIAGNOSIS — C3411 Malignant neoplasm of upper lobe, right bronchus or lung: Secondary | ICD-10-CM | POA: Diagnosis not present

## 2022-06-13 DIAGNOSIS — D3502 Benign neoplasm of left adrenal gland: Secondary | ICD-10-CM | POA: Diagnosis not present

## 2022-06-13 DIAGNOSIS — F17210 Nicotine dependence, cigarettes, uncomplicated: Secondary | ICD-10-CM | POA: Diagnosis not present

## 2022-06-13 LAB — RAD ONC ARIA SESSION SUMMARY
Course Elapsed Days: 22
Plan Fractions Treated to Date: 16
Plan Prescribed Dose Per Fraction: 2 Gy
Plan Total Fractions Prescribed: 30
Plan Total Prescribed Dose: 60 Gy
Reference Point Dosage Given to Date: 32 Gy
Reference Point Session Dosage Given: 2 Gy
Session Number: 16

## 2022-06-13 NOTE — Progress Notes (Signed)
[facility] [address] [place] [address]    Clinic Day:  06/14/2022  Referring physician: Benita Stabile, MD  Patient Care Team: Benita Stabile, MD as PCP - General (Internal Medicine) Doreatha Massed, MD as Medical Oncologist (Medical Oncology) Therese Sarah, RN as Oncology Nurse Navigator (Medical Oncology)   ASSESSMENT & PLAN:   Assessment: 1.  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  No hemoptysis/weight loss. - Chest x-ray (04/10/2022): Mid right lung mass. - CT chest (04/13/2022): 9.2 x 5.7 x 6.6 cm right upper lobe mass with lobulations appearing to extend across the minor fissure into the right middle lobe and right lower lobe.  Satellite lesion in the right upper lobe measures 1.6 x 1.4 x 1.4 cm.  No findings of chest wall invasion.  Borderline prominent lower right paratracheal lymph node 0.9 cm.  Old granulomatous disease.  Small left adrenal adenoma. - Has right posterior chest wall pain for the past 6 months, dull type.  He is not requiring any pain medication. - Brain MRI (04/19/2022): No metastatic disease. - PET scan (04/19/2022): 9 x 5.7 cm right upper lobe mass with SUV 11.8.  1.3 x 0.9 cm satellite nodule in the right upper lobe with SUV 3.8.  No adenopathy or distant metastatic disease. - Bronchoscopy and biopsy by Dr. Delton Coombes - Pathology (04/23/2022): Both right upper lobe lung nodule FNA consistent with poorly differentiated carcinoma.  IHC diffusely positive for TTF-1, Napsin, synaptophysin and CK7.  CD56 negative.  P63 and CK5/6 negative.  Given Napsin positivity, favor adenocarcinoma.  There is also diffuse synaptophysin positivity consistent with neuroendocrine differentiation.  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 - Discussed with Dr. Dorris Fetch.  Not a surgical candidate as he requires pneumonectomy and has  suboptimal DLCO. - Chemoradiation with carboplatin and paclitaxel started on 05/22/2022   2.  Social/family history: - He is seen with his 2 sisters today.  Lives by himself and is independent of ADLs and IADLs.  He retired after working at [employer] for the last 34 years.  No asbestos exposure.  No other chemicals.  Smokes more than 1 pack/day for the last 50 years (started at age 62) quit smoking for 5 years before starting back again. - Brother died of lung cancer.  Father had bladder cancer.  Maternal uncle had cancer.    Plan: 1.  Stage IIIa (T4 N0 M0) adenocarcinoma of the right lung: - He did not have any major GI side effects after last cycle of chemotherapy. - He has developed erythematous maculopapular itching rash on the left leg more than the right leg in the last few days. - She reports occasional numbness in the hands. - Reviewed labs today which showed normal LFTs and CBC.  Mild hyponatremia stable. - Proceed with cycle 4 today.  RTC 1 week for follow-up. - Recommend hydrocortisone 1% cream twice daily for the rash on the legs.   2.  Anxiety/depression: - Continue clonazepam 1 mg twice daily.  Well-controlled.   3.  COPD: - He reports dyspnea in the mornings.  Continue albuterol inhaler as needed. - He reports thick phlegm in the mornings when he coughs.  This becomes more liquidy as the day goes.  He will call us if it turns greenish or yellow.    No orders of the defined types were placed  in this encounter.     I,Katie Daubenspeck,acting as a Neurosurgeon for Doreatha Massed, MD.,have documented all relevant documentation on the behalf of Doreatha Massed, MD,as directed by  Doreatha Massed, MD while in the presence of Doreatha Massed, MD.   I, Doreatha Massed MD, have reviewed the above documentation for accuracy and completeness, and I agree with the above.   Doreatha Massed, MD   6/6/20245:49 PM  CHIEF COMPLAINT:   Diagnosis: RUL  lung adenocarcinoma    Cancer Staging  Non-small cell cancer of right lung [facility]) Staging form: Lung, AJCC 8th Edition - Clinical stage from 05/01/2022: Stage IIIA (cT4, cN0, cM0) - Unsigned    Prior Therapy: none  Current Therapy:  concurrent chemoradiation with carboplatin + paclitaxel    HISTORY OF PRESENT ILLNESS:   Oncology History  Non-small cell cancer of right lung (HCC)  05/01/2022 Initial Diagnosis   Non-small cell cancer of right lung (HCC)   05/22/2022 -  Chemotherapy   Patient is on Treatment Plan : LUNG Carboplatin + Paclitaxel + XRT q7d        INTERVAL HISTORY:   Carlos [name] is a 67 y.o. male presenting to clinic today for follow up of RUL lung adenocarcinoma . He was last seen by me on 06/07/22.  Today, he states that he is doing well overall. His appetite level is at 100%. His energy level is at 80%.  PAST MEDICAL HISTORY:   Past Medical History: Past Medical History:  Diagnosis Date   Anxiety    Cancer (HCC)    Skin cancer left shoulder   Chronic low back pain    Complication of anesthesia    Dermatofibrosarcoma protubera of right shoulder    Dyspnea    GERD (gastroesophageal reflux disease)    Hyperlipidemia    Lung cancer (HCC) 04/23/2022   PONV (postoperative nausea and vomiting)    Port-A-Cath in place 05/17/2022   Prostate cancer [facility])     Surgical History: Past Surgical History:  Procedure Laterality Date   APPENDECTOMY     APPLICATION OF A-CELL OF EXTREMITY Right 07/09/2012   Procedure: PLACEMENT OF A-CELL TO UPPER EXTREMITY/PLACEMENT OF VAC;  Surgeon: Wayland Denis, DO;  Location: [facility];  Service: Plastics;  Laterality: Right;   BRONCHIAL BRUSHINGS  04/23/2022   Procedure: BRONCHIAL BRUSHINGS;  Surgeon: Leslye Peer, MD;  Location: [facility] ENDOSCOPY;  Service: Pulmonary;;   BRONCHIAL NEEDLE ASPIRATION BIOPSY  04/23/2022   Procedure: BRONCHIAL NEEDLE ASPIRATION BIOPSIES;  Surgeon: Leslye Peer, MD;  Location: [facility] ENDOSCOPY;   Service: Pulmonary;;   COLONOSCOPY WITH PROPOFOL N/A 09/30/2015   Procedure: COLONOSCOPY WITH PROPOFOL;  Surgeon: Malissa Hippo, MD;  Location: [facility];  Service: Endoscopy;  Laterality: N/A;  830   EXCISION DERMATOFIBROCARCOMA PROTUBERAN RIGHT SHOULDER  2 - 3 WKS AGO   HEMOSTASIS CONTROL  04/23/2022   Procedure: HEMOSTASIS CONTROL;  Surgeon: Leslye Peer, MD;  Location: [facility] ENDOSCOPY;  Service: Pulmonary;;   INGUINAL HERNIA REPAIR Left 1999   IR IMAGING GUIDED PORT INSERTION  04/30/2022   KNEE ARTHROSCOPY Left 2012   POLYPECTOMY  09/30/2015   Procedure: POLYPECTOMY;  Surgeon: Malissa Hippo, MD;  Location: [facility];  Service: Endoscopy;;  sigmoid   VIDEO BRONCHOSCOPY WITH ENDOBRONCHIAL ULTRASOUND Bilateral 04/23/2022   Procedure: VIDEO BRONCHOSCOPY WITH ENDOBRONCHIAL ULTRASOUND;  Surgeon: Leslye Peer, MD;  Location[facility] ENDOSCOPY;  Service: Pulmonary;  Laterality: Bilateral;   VIDEO BRONCHOSCOPY WITH RADIAL ENDOBRONCHIAL ULTRASOUND  04/23/2022  Procedure: VIDEO BRONCHOSCOPY WITH RADIAL ENDOBRONCHIAL ULTRASOUND;  Surgeon: Leslye Peer, MD;  Location: [facility] ENDOSCOPY;  Service: Pulmonary;;    Social History: Social History   Socioeconomic History   Marital status: Divorced    Spouse name: Burna Mortimer   Number of children: 1   Years of education: 10th   Highest education level: Not on file  Occupational History    Employer: [employer]    Comment: [employer]  Tobacco Use   Smoking status: Every Day    Packs/day: 1.00    Years: 40.00    Additional pack years: 0.00    Total pack years: 40.00    Types: Cigarettes   Smokeless tobacco: Never  Vaping Use   Vaping Use: Never used  Substance and Sexual Activity   Alcohol use: Yes    Comment: 2-3 cans beer/day   Drug use: No   Sexual activity: Not on file  Other Topics Concern   Not on file  Social History Narrative   Patient lives at home with his dog. Patient has one child.Patient is not working: just  on [employer] surgeon. Patient has 10 grade education.Patient is right-handed.Caffeine Use: 1 cup of coffee; 3-4 sodas daily   Social Determinants of Health   Financial Resource Strain: High Risk (05/10/2022)   Overall Financial Resource Strain (CARDIA)    Difficulty of Paying Living Expenses: Very hard  Food Insecurity: Food Insecurity Present (05/04/2022)   Hunger Vital Sign    Worried About Running Out of Food in the Last Year: Sometimes true    Ran Out of Food in the Last Year: Sometimes true  Transportation Needs: No Transportation Needs (05/04/2022)   PRAPARE - Administrator, Civil Service (Medical): No    Lack of Transportation (Non-Medical): No  Physical Activity: Not on file  Stress: Not on file  Social Connections: Not on file  Intimate Partner Violence: Not At Risk (05/04/2022)   Humiliation, Afraid, Rape, and Kick questionnaire    Fear of Current or Ex-Partner: No    Emotionally Abused: No    Physically Abused: No    Sexually Abused: No    Family History: Family History  Problem Relation Age of Onset   Dementia Mother    Cancer Father     Current Medications:  Current Outpatient Medications:    acetaminophen (TYLENOL) 500 MG tablet, Take 1,000 mg by mouth every 6 (six) hours as needed (pain.)., Disp: , Rfl:    albuterol (VENTOLIN HFA) 108 (90 Base) MCG/ACT inhaler, Inhale 2 puffs into the lungs every 6 (six) hours as needed for wheezing or shortness of breath., Disp: 8 g, Rfl: 6   aspirin EC 81 MG tablet, Take 81 mg by mouth at bedtime. Swallow whole., Disp: , Rfl:    CARBOPLATIN IV, Inject into the vein once a week., Disp: , Rfl:    clonazePAM (KLONOPIN) 1 MG disintegrating tablet, Take one tablet by mouth every morning and two tablets by mouth at bedtime, Disp: 90 tablet, Rfl: 2   lidocaine-prilocaine (EMLA) cream, Apply a quarter-sized amount to port a cath site and cover with plastic wrap 1 hour prior to infusion appointments, Disp: 30 g, Rfl: 3    meloxicam (MOBIC) 15 MG tablet, Take 15 mg by mouth in the morning., Disp: , Rfl:    PACLitaxel (TAXOL IV), Inject into the vein once a week., Disp: , Rfl:    pantoprazole (PROTONIX) 40 MG tablet, Take 40 mg by mouth daily., Disp: , Rfl:  prochlorperazine (COMPAZINE) 10 MG tablet, Take 1 tablet (10 mg total) by mouth every 6 (six) hours as needed for nausea or vomiting., Disp: 60 tablet, Rfl: 3   simvastatin (ZOCOR) 20 MG tablet, Take 20 mg by mouth at bedtime., Disp: , Rfl: 2   tamsulosin (FLOMAX) 0.4 MG CAPS capsule, Take 1 capsule (0.4 mg total) by mouth daily. (Patient taking differently: Take 0.4 mg by mouth at bedtime.), Disp: 30 capsule, Rfl: 11   Allergies: Allergies  Allergen Reactions   Ciprofloxacin Swelling and Other (See Comments)    Swelling in hands, numbness in arms and joint pain   Gabapentin Other (See Comments)    Double vision    REVIEW OF SYSTEMS:   Review of Systems  Constitutional:  Negative for chills, fatigue and fever.  HENT:   Negative for lump/mass, mouth sores, nosebleeds, sore throat and trouble swallowing.   Eyes:  Negative for eye problems.  Respiratory:  Positive for cough and shortness of breath.   Cardiovascular:  Negative for chest pain, leg swelling and palpitations.  Gastrointestinal:  Negative for abdominal pain, constipation, diarrhea, nausea and vomiting.  Genitourinary:  Negative for bladder incontinence, difficulty urinating, dysuria, frequency, hematuria and nocturia.   Musculoskeletal:  Negative for arthralgias, back pain, flank pain, myalgias and neck pain.  Skin:  Negative for itching and rash.  Neurological:  Positive for dizziness. Negative for headaches and numbness.  Hematological:  Does not bruise/bleed easily.  Psychiatric/Behavioral:  Positive for sleep disturbance. Negative for depression and suicidal ideas. The patient is not nervous/anxious.   All other systems reviewed and are negative.    VITALS:   There were no vitals  taken for this visit.  Wt Readings from Last 3 Encounters:  06/14/22 184 lb (83.5 kg)  06/07/22 183 lb (83 kg)  05/29/22 184 lb 9.6 oz (83.7 kg)    There is no height or weight on file to calculate BMI.  Performance status (ECOG): 1 - Symptomatic but completely ambulatory  PHYSICAL EXAM:   Physical Exam Vitals and nursing note reviewed. Exam conducted with a chaperone present.  Constitutional:      Appearance: Normal appearance.  Cardiovascular:     Rate and Rhythm: Normal rate and regular rhythm.     Pulses: Normal pulses.     Heart sounds: Normal heart sounds.  Pulmonary:     Effort: Pulmonary effort is normal.     Breath sounds: Normal breath sounds.  Abdominal:     Palpations: Abdomen is soft. There is no hepatomegaly, splenomegaly or mass.     Tenderness: There is no abdominal tenderness.  Musculoskeletal:     Right lower leg: No edema.     Left lower leg: No edema.  Lymphadenopathy:     Cervical: No cervical adenopathy.     Right cervical: No superficial, deep or posterior cervical adenopathy.    Left cervical: No superficial, deep or posterior cervical adenopathy.     Upper Body:     Right upper body: No supraclavicular or axillary adenopathy.     Left upper body: No supraclavicular or axillary adenopathy.  Neurological:     General: No focal deficit present.     Mental Status: He is alert and oriented to person, place, and time.  Psychiatric:        Mood and Affect: Mood normal.        Behavior: Behavior normal.     LABS:      Latest Ref Rng & Units 06/14/2022  7:46 AM 06/07/2022    8:01 AM 05/29/2022    9:40 AM  CBC  WBC 4.0 - 10.5 K/uL 5.4  6.8  6.5   Hemoglobin 13.0 - 17.0 g/dL 40.9  81.1  91.4   Hematocrit 39.0 - 52.0 % 40.9  41.5  40.8   Platelets 150 - 400 K/uL 338  437  381       Latest Ref Rng & Units 06/14/2022    7:46 AM 06/07/2022    8:01 AM 05/29/2022    9:40 AM  CMP  Glucose 70 - 99 mg/dL 782  956  213   BUN 8 - 23 mg/dL 10  12  15     Creatinine 0.61 - 1.24 mg/dL 0.86  5.78  4.69   Sodium 135 - 145 mmol/L 132  132  133   Potassium 3.5 - 5.1 mmol/L 4.2  3.9  3.7   Chloride 98 - 111 mmol/L 100  101  102   CO2 22 - 32 mmol/L 22  22  23    Calcium 8.9 - 10.3 mg/dL 9.1  8.9  8.6   Total Protein 6.5 - 8.1 g/dL 7.0  7.2  7.2   Total Bilirubin 0.3 - 1.2 mg/dL 0.5  0.4  0.5   Alkaline Phos 38 - 126 U/L 58  60  62   AST 15 - 41 U/L 22  25  27    ALT 0 - 44 U/L 25  26  31       No results found for: "CEA1", "CEA" / No results found for: "CEA1", "CEA" Lab Results  Component Value Date   PSA1 4.7 (H) 04/12/2022   No results found for: "GEX528" No results found for: "CAN125"  No results found for: "TOTALPROTELP", "ALBUMINELP", "A1GS", "A2GS", "BETS", "BETA2SER", "GAMS", "MSPIKE", "SPEI" No results found for: "TIBC", "FERRITIN", "IRONPCTSAT" No results found for: "LDH"   STUDIES:   No results found.

## 2022-06-14 ENCOUNTER — Other Ambulatory Visit: Payer: Self-pay

## 2022-06-14 ENCOUNTER — Inpatient Hospital Stay: Payer: Medicare Other

## 2022-06-14 ENCOUNTER — Inpatient Hospital Stay: Payer: Medicare Other | Admitting: Hematology

## 2022-06-14 ENCOUNTER — Inpatient Hospital Stay: Payer: Medicare Other | Attending: Hematology

## 2022-06-14 ENCOUNTER — Ambulatory Visit
Admission: RE | Admit: 2022-06-14 | Discharge: 2022-06-14 | Disposition: A | Discharge status: Home / Self Care | Payer: Medicare Other | Source: Ambulatory Visit | Attending: Radiation Oncology | Admitting: Radiation Oncology

## 2022-06-14 VITALS — BP 146/84 | HR 81 | Temp 97.5°F | Resp 18

## 2022-06-14 DIAGNOSIS — D3502 Benign neoplasm of left adrenal gland: Secondary | ICD-10-CM | POA: Diagnosis not present

## 2022-06-14 DIAGNOSIS — Z95828 Presence of other vascular implants and grafts: Secondary | ICD-10-CM | POA: Diagnosis not present

## 2022-06-14 DIAGNOSIS — F17210 Nicotine dependence, cigarettes, uncomplicated: Secondary | ICD-10-CM | POA: Diagnosis not present

## 2022-06-14 DIAGNOSIS — Z79899 Other long term (current) drug therapy: Secondary | ICD-10-CM | POA: Diagnosis not present

## 2022-06-14 DIAGNOSIS — C3411 Malignant neoplasm of upper lobe, right bronchus or lung: Secondary | ICD-10-CM | POA: Insufficient documentation

## 2022-06-14 DIAGNOSIS — C3491 Malignant neoplasm of unspecified part of right bronchus or lung: Secondary | ICD-10-CM | POA: Diagnosis not present

## 2022-06-14 DIAGNOSIS — Z5111 Encounter for antineoplastic chemotherapy: Secondary | ICD-10-CM | POA: Diagnosis not present

## 2022-06-14 DIAGNOSIS — Z510 Encounter for antineoplastic radiation therapy: Secondary | ICD-10-CM | POA: Diagnosis not present

## 2022-06-14 LAB — CBC WITH DIFFERENTIAL/PLATELET
Abs Immature Granulocytes: 0.03 10*3/uL (ref 0.00–0.07)
Basophils Absolute: 0.1 10*3/uL (ref 0.0–0.1)
Basophils Relative: 1 %
Eosinophils Absolute: 0.2 10*3/uL (ref 0.0–0.5)
Eosinophils Relative: 3 %
HCT: 40.9 % (ref 39.0–52.0)
Hemoglobin: 14.1 g/dL (ref 13.0–17.0)
Immature Granulocytes: 1 %
Lymphocytes Relative: 10 %
Lymphs Abs: 0.5 10*3/uL — ABNORMAL LOW (ref 0.7–4.0)
MCH: 31.6 pg (ref 26.0–34.0)
MCHC: 34.5 g/dL (ref 30.0–36.0)
MCV: 91.7 fL (ref 80.0–100.0)
Monocytes Absolute: 0.6 10*3/uL (ref 0.1–1.0)
Monocytes Relative: 11 %
Neutro Abs: 4 10*3/uL (ref 1.7–7.7)
Neutrophils Relative %: 74 %
Platelets: 338 10*3/uL (ref 150–400)
RBC: 4.46 MIL/uL (ref 4.22–5.81)
RDW: 14.4 % (ref 11.5–15.5)
WBC: 5.4 10*3/uL (ref 4.0–10.5)
nRBC: 0 % (ref 0.0–0.2)

## 2022-06-14 LAB — COMPREHENSIVE METABOLIC PANEL
ALT: 25 U/L (ref 0–44)
AST: 22 U/L (ref 15–41)
Albumin: 3.8 g/dL (ref 3.5–5.0)
Alkaline Phosphatase: 58 U/L (ref 38–126)
Anion gap: 10 (ref 5–15)
BUN: 10 mg/dL (ref 8–23)
CO2: 22 mmol/L (ref 22–32)
Calcium: 9.1 mg/dL (ref 8.9–10.3)
Chloride: 100 mmol/L (ref 98–111)
Creatinine, Ser: 0.65 mg/dL (ref 0.61–1.24)
GFR, Estimated: >60 mL/min (ref >60)
Glucose, Bld: 102 mg/dL — ABNORMAL HIGH (ref 70–99)
Potassium: 4.2 mmol/L (ref 3.5–5.1)
Sodium: 132 mmol/L — ABNORMAL LOW (ref 135–145)
Total Bilirubin: 0.5 mg/dL (ref 0.3–1.2)
Total Protein: 7 g/dL (ref 6.5–8.1)

## 2022-06-14 LAB — RAD ONC ARIA SESSION SUMMARY
Course Elapsed Days: 23
Plan Fractions Treated to Date: 17
Plan Prescribed Dose Per Fraction: 2 Gy
Plan Total Fractions Prescribed: 30
Plan Total Prescribed Dose: 60 Gy
Reference Point Dosage Given to Date: 34 Gy
Reference Point Session Dosage Given: 2 Gy
Session Number: 17

## 2022-06-14 LAB — MAGNESIUM: Magnesium: 1.8 mg/dL (ref 1.7–2.4)

## 2022-06-14 MED ORDER — SODIUM CHLORIDE 0.9 % IV SOLN
10.0000 mg | Freq: Once | INTRAVENOUS | Status: AC
  Administered 2022-06-14: 10 mg via INTRAVENOUS
  Filled 2022-06-14: qty 1

## 2022-06-14 MED ORDER — PALONOSETRON HCL INJECTION 0.25 MG/5ML
0.2500 mg | Freq: Once | INTRAVENOUS | Status: AC
  Administered 2022-06-14: 0.25 mg via INTRAVENOUS
  Filled 2022-06-14: qty 5

## 2022-06-14 MED ORDER — SODIUM CHLORIDE 0.9 % IV SOLN
220.0000 mg | Freq: Once | INTRAVENOUS | Status: AC
  Administered 2022-06-14: 220 mg via INTRAVENOUS
  Filled 2022-06-14: qty 22

## 2022-06-14 MED ORDER — SODIUM CHLORIDE 0.9% FLUSH
10.0000 mL | INTRAVENOUS | Status: DC | PRN
  Administered 2022-06-14: 10 mL

## 2022-06-14 MED ORDER — FAMOTIDINE IN NACL 20-0.9 MG/50ML-% IV SOLN
20.0000 mg | Freq: Once | INTRAVENOUS | Status: AC
  Administered 2022-06-14: 20 mg via INTRAVENOUS
  Filled 2022-06-14: qty 50

## 2022-06-14 MED ORDER — CETIRIZINE HCL 10 MG/ML IV SOLN
10.0000 mg | Freq: Once | INTRAVENOUS | Status: AC
  Administered 2022-06-14: 10 mg via INTRAVENOUS
  Filled 2022-06-14: qty 1

## 2022-06-14 MED ORDER — SODIUM CHLORIDE 0.9 % IV SOLN
45.0000 mg/m2 | Freq: Once | INTRAVENOUS | Status: AC
  Administered 2022-06-14: 90 mg via INTRAVENOUS
  Filled 2022-06-14: qty 15

## 2022-06-14 MED ORDER — HEPARIN SOD (PORK) LOCK FLUSH 100 UNIT/ML IV SOLN
500.0000 [IU] | Freq: Once | INTRAVENOUS | Status: AC | PRN
  Administered 2022-06-14: 500 [IU]

## 2022-06-14 MED ORDER — SODIUM CHLORIDE 0.9 % IV SOLN: Freq: Once | INTRAVENOUS | Status: AC

## 2022-06-14 NOTE — Patient Instructions (Addendum)
[facility]  Discharge Instructions: Thank you for choosing [facility] to provide your oncology and hematology care.  If you have a lab appointment with the Cancer Center - please note that after April 8th, 2024, all labs will be drawn in the cancer center.  You do not have to check in or register with the main entrance as you have in the past but will complete your check-in in the cancer center.  Wear comfortable clothing and clothing appropriate for easy access to any Portacath or PICC line.   We strive to give you quality time with your provider. You may need to reschedule your appointment if you arrive late (15 or more minutes).  Arriving late affects you and other patients whose appointments are after yours.  Also, if you miss three or more appointments without notifying the office, you may be dismissed from the clinic at the provider's discretion.      For prescription refill requests, have your pharmacy contact our office and allow 72 hours for refills to be completed.    Today you received the following chemotherapy and/or immunotherapy agents taxol and carboplatin.  Paclitaxel Injection What is this medication? PACLITAXEL (PAK li TAX el) treats some types of cancer. It works by slowing down the growth of cancer cells. This medicine may be used for other purposes; ask your health care provider or pharmacist if you have questions. COMMON BRAND NAME(S): Onxol, Taxol What should I tell my care team before I take this medication? They need to know if you have any of these conditions: Heart disease Liver disease Low white blood cell levels An unusual or allergic reaction to paclitaxel, other medications, foods, dyes, or preservatives If you or your partner are pregnant or trying to get pregnant Breast-feeding How should I use this medication? This medication is injected into a vein. It is given by your care team in a hospital or clinic setting. Talk  to your care team about the use of this medication in children. While it may be given to children for selected conditions, precautions do apply. Overdosage: If you think you have taken too much of this medicine contact a poison control center or emergency room at once. NOTE: This medicine is only for you. Do not share this medicine with others. What if I miss a dose? Keep appointments for follow-up doses. It is important not to miss your dose. Call your care team if you are unable to keep an appointment. What may interact with this medication? Do not take this medication with any of the following: Live virus vaccines Other medications may affect the way this medication works. Talk with your care team about all of the medications you take. They may suggest changes to your treatment plan to lower the risk of side effects and to make sure your medications work as intended. This list may not describe all possible interactions. Give your health care provider a list of all the medicines, herbs, non-prescription drugs, or dietary supplements you use. Also tell them if you smoke, drink alcohol, or use illegal drugs. Some items may interact with your medicine. What should I watch for while using this medication? Your condition will be monitored carefully while you are receiving this medication. You may need blood work while taking this medication. This medication may make you feel generally unwell. This is not uncommon as chemotherapy can affect healthy cells as well as cancer cells. Report any side effects. Continue your course of treatment  even though you feel ill unless your care team tells you to stop. This medication can cause serious allergic reactions. To reduce the risk, your care team may give you other medications to take before receiving this one. Be sure to follow the directions from your care team. This medication may increase your risk of getting an infection. Call your care team for advice if you  get a fever, chills, sore throat, or other symptoms of a cold or flu. Do not treat yourself. Try to avoid being around people who are sick. This medication may increase your risk to bruise or bleed. Call your care team if you notice any unusual bleeding. Be careful brushing or flossing your teeth or using a toothpick because you may get an infection or bleed more easily. If you have any dental work done, tell your dentist you are receiving this medication. Talk to your care team if you may be pregnant. Serious birth defects can occur if you take this medication during pregnancy. Talk to your care team before breastfeeding. Changes to your treatment plan may be needed. What side effects may I notice from receiving this medication? Side effects that you should report to your care team as soon as possible: Allergic reactions--skin rash, itching, hives, swelling of the face, lips, tongue, or throat Heart rhythm changes--fast or irregular heartbeat, dizziness, feeling faint or lightheaded, chest pain, trouble breathing Increase in blood pressure Infection--fever, chills, cough, sore throat, wounds that don't heal, pain or trouble when passing urine, general feeling of discomfort or being unwell Low blood pressure--dizziness, feeling faint or lightheaded, blurry vision Low red blood cell level--unusual weakness or fatigue, dizziness, headache, trouble breathing Painful swelling, warmth, or redness of the skin, blisters or sores at the infusion site Pain, tingling, or numbness in the hands or feet Slow heartbeat--dizziness, feeling faint or lightheaded, confusion, trouble breathing, unusual weakness or fatigue Unusual bruising or bleeding Side effects that usually do not require medical attention (report to your care team if they continue or are bothersome): Diarrhea Hair loss Joint pain Loss of appetite Muscle pain Nausea Vomiting This list may not describe all possible side effects. Call your  doctor for medical advice about side effects. You may report side effects to FDA at 1-800-FDA-1088. Where should I keep my medication? This medication is given in a hospital or clinic. It will not be stored at home. NOTE: This sheet is a summary. It may not cover all possible information. If you have questions about this medicine, talk to your doctor, pharmacist, or health care provider.  2024 Elsevier/Gold Standard (2021-05-16 00:00:00)    Carboplatin Injection What is this medication? CARBOPLATIN (KAR boe pla tin) treats some types of cancer. It works by slowing down the growth of cancer cells. This medicine may be used for other purposes; ask your health care provider or pharmacist if you have questions. COMMON BRAND NAME(S): Paraplatin What should I tell my care team before I take this medication? They need to know if you have any of these conditions: Blood disorders Hearing problems Kidney disease Recent or ongoing radiation therapy An unusual or allergic reaction to carboplatin, cisplatin, other medications, foods, dyes, or preservatives Pregnant or trying to get pregnant Breast-feeding How should I use this medication? This medication is injected into a vein. It is given by your care team in a hospital or clinic setting. Talk to your care team about the use of this medication in children. Special care may be needed. Overdosage: If you think you  have taken too much of this medicine contact a poison control center or emergency room at once. NOTE: This medicine is only for you. Do not share this medicine with others. What if I miss a dose? Keep appointments for follow-up doses. It is important not to miss your dose. Call your care team if you are unable to keep an appointment. What may interact with this medication? Medications for seizures Some antibiotics, such as amikacin, gentamicin, neomycin, streptomycin, tobramycin Vaccines This list may not describe all possible  interactions. Give your health care provider a list of all the medicines, herbs, non-prescription drugs, or dietary supplements you use. Also tell them if you smoke, drink alcohol, or use illegal drugs. Some items may interact with your medicine. What should I watch for while using this medication? Your condition will be monitored carefully while you are receiving this medication. You may need blood work while taking this medication. This medication may make you feel generally unwell. This is not uncommon, as chemotherapy can affect healthy cells as well as cancer cells. Report any side effects. Continue your course of treatment even though you feel ill unless your care team tells you to stop. In some cases, you may be given additional medications to help with side effects. Follow all directions for their use. This medication may increase your risk of getting an infection. Call your care team for advice if you get a fever, chills, sore throat, or other symptoms of a cold or flu. Do not treat yourself. Try to avoid being around people who are sick. Avoid taking medications that contain aspirin, acetaminophen, ibuprofen, naproxen, or ketoprofen unless instructed by your care team. These medications may hide a fever. Be careful brushing or flossing your teeth or using a toothpick because you may get an infection or bleed more easily. If you have any dental work done, tell your dentist you are receiving this medication. Talk to your care team if you wish to become pregnant or think you might be pregnant. This medication can cause serious birth defects. Talk to your care team about effective forms of contraception. Do not breast-feed while taking this medication. What side effects may I notice from receiving this medication? Side effects that you should report to your care team as soon as possible: Allergic reactions--skin rash, itching, hives, swelling of the face, lips, tongue, or throat Infection--fever,  chills, cough, sore throat, wounds that don't heal, pain or trouble when passing urine, general feeling of discomfort or being unwell Low red blood cell level--unusual weakness or fatigue, dizziness, headache, trouble breathing Pain, tingling, or numbness in the hands or feet, muscle weakness, change in vision, confusion or trouble speaking, loss of balance or coordination, trouble walking, seizures Unusual bruising or bleeding Side effects that usually do not require medical attention (report to your care team if they continue or are bothersome): Hair loss Nausea Unusual weakness or fatigue Vomiting This list may not describe all possible side effects. Call your doctor for medical advice about side effects. You may report side effects to FDA at 1-800-FDA-1088. Where should I keep my medication? This medication is given in a hospital or clinic. It will not be stored at home. NOTE: This sheet is a summary. It may not cover all possible information. If you have questions about this medicine, talk to your doctor, pharmacist, or health care provider.  2024 Elsevier/Gold Standard (2021-04-18 00:00:00)        To help prevent nausea and vomiting after your treatment, we encourage  you to take your nausea medication as directed.  BELOW ARE SYMPTOMS THAT SHOULD BE REPORTED IMMEDIATELY: *FEVER GREATER THAN 100.4 F (38 C) OR HIGHER *CHILLS OR SWEATING *NAUSEA AND VOMITING THAT IS NOT CONTROLLED WITH YOUR NAUSEA MEDICATION *UNUSUAL SHORTNESS OF BREATH *UNUSUAL BRUISING OR BLEEDING *URINARY PROBLEMS (pain or burning when urinating, or frequent urination) *BOWEL PROBLEMS (unusual diarrhea, constipation, pain near the anus) TENDERNESS IN MOUTH AND THROAT WITH OR WITHOUT PRESENCE OF ULCERS (sore throat, sores in mouth, or a toothache) UNUSUAL RASH, SWELLING OR PAIN  UNUSUAL VAGINAL DISCHARGE OR ITCHING   Items with * indicate a potential emergency and should be followed up as soon as possible or go  to the Emergency Department if any problems should occur.  Please show the CHEMOTHERAPY ALERT CARD or IMMUNOTHERAPY ALERT CARD at check-in to the Emergency Department and triage nurse.  Should you have questions after your visit or need to cancel or reschedule your appointment, please contact [facility] [phone]  and follow the prompts.  Office hours are 8:00 a.m. to 4:30 p.m. Monday - Friday. Please note that voicemails left after 4:00 p.m. may not be returned until the following business day.  We are closed weekends and major holidays. You have access to a nurse at all times for urgent questions. Please call the main number to the clinic [phone] and follow the prompts.  For any non-urgent questions, you may also contact your provider using MyChart. We now offer e-Visits for anyone 58 and older to request care online for non-urgent symptoms. For details visit [email or web address].   Also download the MyChart app! Go to the app store, search "MyChart", open the app, select [place], and log in with your MyChart username and password.

## 2022-06-14 NOTE — Progress Notes (Signed)
Paclitaxel diluted in NS , Excel Bag, Lot: [id number], Exp:  10/08/23   Richardean Sale, RPH, BCPS, BCOP 06/14/2022 12:53 PM

## 2022-06-14 NOTE — Patient Instructions (Signed)
[facility] at [facility] Discharge Instructions   You were seen and examined today by Dr. Katragadda.  He reviewed the results of your lab work which are normal/stable.   We will proceed with your treatment today.  Return as scheduled.    Thank you for choosing [facility] at [facility] to provide your oncology and hematology care.  To afford each patient quality time with our provider, please arrive at least 15 minutes before your scheduled appointment time.   If you have a lab appointment with the Cancer Center please come in thru the Main Entrance and check in at the main information desk.  You need to re-schedule your appointment should you arrive 10 or more minutes late.  We strive to give you quality time with our providers, and arriving late affects you and other patients whose appointments are after yours.  Also, if you no show three or more times for appointments you may be dismissed from the clinic at the providers discretion.     Again, thank you for choosing [facility].  Our hope is that these requests will decrease the amount of time that you wait before being seen by our physicians.       _____________________________________________________________  Should you have questions after your visit to [facility], please contact our office at [phone] and follow the prompts.  Our office hours are 8:00 a.m. and 4:30 p.m. Monday - Friday.  Please note that voicemails left after 4:00 p.m. may not be returned until the following business day.  We are closed weekends and major holidays.  You do have access to a nurse 24-7, just call the main number to the clinic [phone] and do not press any options, hold on the line and a nurse will answer the phone.    For prescription refill requests, have your pharmacy contact our office and allow 72 hours.    Due to Covid, you will need to wear a mask upon entering  the hospital. If you do not have a mask, a mask will be given to you at the Main Entrance upon arrival. For doctor visits, patients may have 1 support person age 18 or older with them. For treatment visits, patients can not have anyone with them due to social distancing guidelines and our immunocompromised population.      

## 2022-06-14 NOTE — Progress Notes (Signed)
Patient presents today for chemotherapy infusion. Patient is in satisfactory condition with no new complaints voiced.  Vital signs are stable.  Labs reviewed by Dr. Ellin Saba during the office visit and all labs are within treatment parameters.  We will proceed with treatment per MD orders.   Patient remained stable during treatment and tolerated well.  He was discharged ambulatory and in stable condition with his wife. He is aware of his next follow up appointment.  He was encouraged to call us if he has any issues this week. He verbalized understanding.

## 2022-06-14 NOTE — Progress Notes (Signed)
Patient has been examined by Dr. Katragadda. Vital signs and labs have been reviewed by MD - ANC, Creatinine, LFTs, hemoglobin, and platelets are within treatment parameters per M.D. - pt may proceed with treatment.  Primary RN and pharmacy notified.  

## 2022-06-15 ENCOUNTER — Other Ambulatory Visit: Payer: Self-pay

## 2022-06-15 ENCOUNTER — Ambulatory Visit
Admission: RE | Admit: 2022-06-15 | Discharge: 2022-06-15 | Disposition: A | Discharge status: Home / Self Care | Payer: Medicare Other | Source: Ambulatory Visit | Attending: Radiation Oncology | Admitting: Radiation Oncology

## 2022-06-15 DIAGNOSIS — Z510 Encounter for antineoplastic radiation therapy: Secondary | ICD-10-CM | POA: Diagnosis not present

## 2022-06-15 DIAGNOSIS — Z5111 Encounter for antineoplastic chemotherapy: Secondary | ICD-10-CM | POA: Diagnosis not present

## 2022-06-15 DIAGNOSIS — C3411 Malignant neoplasm of upper lobe, right bronchus or lung: Secondary | ICD-10-CM | POA: Diagnosis not present

## 2022-06-15 DIAGNOSIS — D3502 Benign neoplasm of left adrenal gland: Secondary | ICD-10-CM | POA: Diagnosis not present

## 2022-06-15 DIAGNOSIS — Z79899 Other long term (current) drug therapy: Secondary | ICD-10-CM | POA: Diagnosis not present

## 2022-06-15 DIAGNOSIS — F17210 Nicotine dependence, cigarettes, uncomplicated: Secondary | ICD-10-CM | POA: Diagnosis not present

## 2022-06-15 LAB — RAD ONC ARIA SESSION SUMMARY
Course Elapsed Days: 24
Plan Fractions Treated to Date: 18
Plan Prescribed Dose Per Fraction: 2 Gy
Plan Total Fractions Prescribed: 30
Plan Total Prescribed Dose: 60 Gy
Reference Point Dosage Given to Date: 36 Gy
Reference Point Session Dosage Given: 2 Gy
Session Number: 18

## 2022-06-18 ENCOUNTER — Other Ambulatory Visit: Payer: Self-pay

## 2022-06-18 ENCOUNTER — Ambulatory Visit
Admission: RE | Admit: 2022-06-18 | Discharge: 2022-06-18 | Disposition: A | Discharge status: Home / Self Care | Payer: Medicare Other | Source: Ambulatory Visit | Attending: Radiation Oncology | Admitting: Radiation Oncology

## 2022-06-18 DIAGNOSIS — Z5111 Encounter for antineoplastic chemotherapy: Secondary | ICD-10-CM | POA: Diagnosis not present

## 2022-06-18 DIAGNOSIS — D3502 Benign neoplasm of left adrenal gland: Secondary | ICD-10-CM | POA: Diagnosis not present

## 2022-06-18 DIAGNOSIS — Z510 Encounter for antineoplastic radiation therapy: Secondary | ICD-10-CM | POA: Diagnosis not present

## 2022-06-18 DIAGNOSIS — C3411 Malignant neoplasm of upper lobe, right bronchus or lung: Secondary | ICD-10-CM | POA: Diagnosis not present

## 2022-06-18 DIAGNOSIS — F17210 Nicotine dependence, cigarettes, uncomplicated: Secondary | ICD-10-CM | POA: Diagnosis not present

## 2022-06-18 DIAGNOSIS — Z79899 Other long term (current) drug therapy: Secondary | ICD-10-CM | POA: Diagnosis not present

## 2022-06-18 LAB — RAD ONC ARIA SESSION SUMMARY
Course Elapsed Days: 27
Plan Fractions Treated to Date: 19
Plan Prescribed Dose Per Fraction: 2 Gy
Plan Total Fractions Prescribed: 30
Plan Total Prescribed Dose: 60 Gy
Reference Point Dosage Given to Date: 38 Gy
Reference Point Session Dosage Given: 2 Gy
Session Number: 19

## 2022-06-19 ENCOUNTER — Other Ambulatory Visit: Payer: Self-pay | Admitting: Radiation Oncology

## 2022-06-19 ENCOUNTER — Other Ambulatory Visit: Payer: Self-pay

## 2022-06-19 ENCOUNTER — Ambulatory Visit
Admission: RE | Admit: 2022-06-19 | Discharge: 2022-06-19 | Disposition: A | Discharge status: Home / Self Care | Payer: Medicare Other | Source: Ambulatory Visit | Attending: Radiation Oncology | Admitting: Radiation Oncology

## 2022-06-19 DIAGNOSIS — Z5111 Encounter for antineoplastic chemotherapy: Secondary | ICD-10-CM | POA: Diagnosis not present

## 2022-06-19 DIAGNOSIS — C3411 Malignant neoplasm of upper lobe, right bronchus or lung: Secondary | ICD-10-CM | POA: Diagnosis not present

## 2022-06-19 DIAGNOSIS — Z79899 Other long term (current) drug therapy: Secondary | ICD-10-CM | POA: Diagnosis not present

## 2022-06-19 DIAGNOSIS — D3502 Benign neoplasm of left adrenal gland: Secondary | ICD-10-CM | POA: Diagnosis not present

## 2022-06-19 DIAGNOSIS — F17210 Nicotine dependence, cigarettes, uncomplicated: Secondary | ICD-10-CM | POA: Diagnosis not present

## 2022-06-19 DIAGNOSIS — Z510 Encounter for antineoplastic radiation therapy: Secondary | ICD-10-CM | POA: Diagnosis not present

## 2022-06-19 LAB — RAD ONC ARIA SESSION SUMMARY
Course Elapsed Days: 28
Plan Fractions Treated to Date: 20
Plan Prescribed Dose Per Fraction: 2 Gy
Plan Total Fractions Prescribed: 30
Plan Total Prescribed Dose: 60 Gy
Reference Point Dosage Given to Date: 40 Gy
Reference Point Session Dosage Given: 2 Gy
Session Number: 20

## 2022-06-19 MED ORDER — SUCRALFATE 1 G PO TABS: 1.0000 g | ORAL_TABLET | Freq: Four times a day (QID) | ORAL | 2 refills | Status: DC

## 2022-06-20 ENCOUNTER — Ambulatory Visit
Admission: RE | Admit: 2022-06-20 | Discharge: 2022-06-20 | Disposition: A | Discharge status: Home / Self Care | Payer: Medicare Other | Source: Ambulatory Visit | Attending: Radiation Oncology | Admitting: Radiation Oncology

## 2022-06-20 ENCOUNTER — Other Ambulatory Visit: Payer: Self-pay

## 2022-06-20 ENCOUNTER — Encounter: Payer: Self-pay | Admitting: Hematology

## 2022-06-20 DIAGNOSIS — D3502 Benign neoplasm of left adrenal gland: Secondary | ICD-10-CM | POA: Diagnosis not present

## 2022-06-20 DIAGNOSIS — Z5111 Encounter for antineoplastic chemotherapy: Secondary | ICD-10-CM | POA: Diagnosis not present

## 2022-06-20 DIAGNOSIS — C3411 Malignant neoplasm of upper lobe, right bronchus or lung: Secondary | ICD-10-CM | POA: Diagnosis not present

## 2022-06-20 DIAGNOSIS — F17210 Nicotine dependence, cigarettes, uncomplicated: Secondary | ICD-10-CM | POA: Diagnosis not present

## 2022-06-20 DIAGNOSIS — Z79899 Other long term (current) drug therapy: Secondary | ICD-10-CM | POA: Diagnosis not present

## 2022-06-20 DIAGNOSIS — Z510 Encounter for antineoplastic radiation therapy: Secondary | ICD-10-CM | POA: Diagnosis not present

## 2022-06-20 LAB — RAD ONC ARIA SESSION SUMMARY
Course Elapsed Days: 29
Plan Fractions Treated to Date: 21
Plan Prescribed Dose Per Fraction: 2 Gy
Plan Total Fractions Prescribed: 30
Plan Total Prescribed Dose: 60 Gy
Reference Point Dosage Given to Date: 42 Gy
Reference Point Session Dosage Given: 2 Gy
Session Number: 21

## 2022-06-20 NOTE — Progress Notes (Signed)
[facility] [address], [place] [address]    Clinic Day:  06/21/2022  Referring physician: Benita Stabile, MD  Patient Care Team: Benita Stabile, MD as PCP - General (Internal Medicine) Doreatha Massed, MD as Medical Oncologist (Medical Oncology) Therese Sarah, RN as Oncology Nurse Navigator (Medical Oncology)   ASSESSMENT & PLAN:   Assessment: 1.  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  No hemoptysis/weight loss. - Chest x-ray (04/10/2022): Mid right lung mass. - CT chest (04/13/2022): 9.2 x 5.7 x 6.6 cm right upper lobe mass with lobulations appearing to extend across the minor fissure into the right middle lobe and right lower lobe.  Satellite lesion in the right upper lobe measures 1.6 x 1.4 x 1.4 cm.  No findings of chest wall invasion.  Borderline prominent lower right paratracheal lymph node 0.9 cm.  Old granulomatous disease.  Small left adrenal adenoma. - Has right posterior chest wall pain for the past 6 months, dull type.  He is not requiring any pain medication. - Brain MRI (04/19/2022): No metastatic disease. - PET scan (04/19/2022): 9 x 5.7 cm right upper lobe mass with SUV 11.8.  1.3 x 0.9 cm satellite nodule in the right upper lobe with SUV 3.8.  No adenopathy or distant metastatic disease. - Bronchoscopy and biopsy by Dr. Delton Coombes - Pathology (04/23/2022): Both right upper lobe lung nodule FNA consistent with poorly differentiated carcinoma.  IHC diffusely positive for TTF-1, Napsin, synaptophysin and CK7.  CD56 negative.  P63 and CK5/6 negative.  Given Napsin positivity, favor adenocarcinoma.  There is also diffuse synaptophysin positivity consistent with neuroendocrine differentiation.  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 - Discussed with Dr. Dorris Fetch.  Not a surgical candidate as he requires pneumonectomy and has  suboptimal DLCO. - Chemoradiation with carboplatin and paclitaxel started on 05/22/2022   2.  Social/family history: - He is seen with his 2 sisters today.  Lives by himself and is independent of ADLs and IADLs.  He retired after working at [employer] for the last 34 years.  No asbestos exposure.  No other chemicals.  Smokes more than 1 pack/day for the last 50 years (started at age 44) quit smoking for 5 years before starting back again. - Brother died of lung cancer.  Father had bladder cancer.  Maternal uncle had cancer.    Plan: 1.  Stage IIIa (T4 N0 M0) adenocarcinoma of the right lung: - He did not have any GI side effects. - Continue hydrocortisone 1% cream to the leg rash which has improved. - Reviewed labs today: Normal LFTs and creatinine.  CBC grossly normal. - Proceed with cycle 5 today.  RTC 1 week for follow-up. - XRT will end on 07/06/2022.   2.  Anxiety/depression: - Continue clonazepam 1 mg twice daily.  This is well-controlled.   3.  COPD: - He has cough with expectoration in the mornings.  Use albuterol inhaler as needed.    Orders Placed This Encounter  Procedures   Magnesium    Standing Status:   Future    Standing Expiration Date:   07/02/2023   CBC with Differential    Standing Status:   Future    Standing Expiration Date:   07/02/2023   Comprehensive metabolic panel    Standing Status:   Future    Standing Expiration Date:  07/02/2023      I,Katie Daubenspeck,acting as a scribe for Doreatha Massed, MD.,have documented all relevant documentation on the behalf of Doreatha Massed, MD,as directed by  Doreatha Massed, MD while in the presence of Doreatha Massed, MD.   I, Doreatha Massed MD, have reviewed the above documentation for accuracy and completeness, and I agree with the above.   Doreatha Massed, MD   6/13/20246:12 PM  CHIEF COMPLAINT:   Diagnosis: RUL lung adenocarcinoma    Cancer Staging  Non-small cell cancer  of right lung [facility]) Staging form: Lung, AJCC 8th Edition - Clinical stage from 05/01/2022: Stage IIIA (cT4, cN0, cM0) - Unsigned    Prior Therapy: none  Current Therapy:  concurrent chemoradiation with carboplatin + paclitaxel    HISTORY OF PRESENT ILLNESS:   Oncology History  Non-small cell cancer of right lung (HCC)  05/01/2022 Initial Diagnosis   Non-small cell cancer of right lung (HCC)   05/22/2022 -  Chemotherapy   Patient is on Treatment Plan : LUNG Carboplatin + Paclitaxel + XRT q7d        INTERVAL HISTORY:   Carlos [name] is a 67 y.o. male presenting to clinic today for follow up of RUL lung adenocarcinoma. He was last seen by me on 06/14/22.  Today, he states that he is doing well overall. His appetite level is at 100%. His energy level is at 75%.  PAST MEDICAL HISTORY:   Past Medical History: Past Medical History:  Diagnosis Date   Anxiety    Cancer (HCC)    Skin cancer left shoulder   Chronic low back pain    Complication of anesthesia    Dermatofibrosarcoma protubera of right shoulder    Dyspnea    GERD (gastroesophageal reflux disease)    Hyperlipidemia    Lung cancer (HCC) 04/23/2022   PONV (postoperative nausea and vomiting)    Port-A-Cath in place 05/17/2022   Prostate cancer [facility])     Surgical History: Past Surgical History:  Procedure Laterality Date   APPENDECTOMY     APPLICATION OF A-CELL OF EXTREMITY Right 07/09/2012   Procedure: PLACEMENT OF A-CELL TO UPPER EXTREMITY/PLACEMENT OF VAC;  Surgeon: Wayland Denis, DO;  Location: [facility];  Service: Plastics;  Laterality: Right;   BRONCHIAL BRUSHINGS  04/23/2022   Procedure: BRONCHIAL BRUSHINGS;  Surgeon: Leslye Peer, MD;  Location: [facility] ENDOSCOPY;  Service: Pulmonary;;   BRONCHIAL NEEDLE ASPIRATION BIOPSY  04/23/2022   Procedure: BRONCHIAL NEEDLE ASPIRATION BIOPSIES;  Surgeon: Leslye Peer, MD;  Location: [facility] ENDOSCOPY;  Service: Pulmonary;;   COLONOSCOPY WITH PROPOFOL N/A 09/30/2015    Procedure: COLONOSCOPY WITH PROPOFOL;  Surgeon: Malissa Hippo, MD;  Location: [facility];  Service: Endoscopy;  Laterality: N/A;  830   EXCISION DERMATOFIBROCARCOMA PROTUBERAN RIGHT SHOULDER  2 - 3 WKS AGO   HEMOSTASIS CONTROL  04/23/2022   Procedure: HEMOSTASIS CONTROL;  Surgeon: Leslye Peer, MD;  Location: [facility] ENDOSCOPY;  Service: Pulmonary;;   INGUINAL HERNIA REPAIR Left 1999   IR IMAGING GUIDED PORT INSERTION  04/30/2022   KNEE ARTHROSCOPY Left 2012   POLYPECTOMY  09/30/2015   Procedure: POLYPECTOMY;  Surgeon: Malissa Hippo, MD;  Location: [facility];  Service: Endoscopy;;  sigmoid   VIDEO BRONCHOSCOPY WITH ENDOBRONCHIAL ULTRASOUND Bilateral 04/23/2022   Procedure: VIDEO BRONCHOSCOPY WITH ENDOBRONCHIAL ULTRASOUND;  Surgeon: Leslye Peer, MD;  Location: [facility] ENDOSCOPY;  Service: Pulmonary;  Laterality: Bilateral;   VIDEO BRONCHOSCOPY WITH RADIAL ENDOBRONCHIAL ULTRASOUND  04/23/2022   Procedure: VIDEO  BRONCHOSCOPY WITH RADIAL ENDOBRONCHIAL ULTRASOUND;  Surgeon: Leslye Peer, MD;  Location: [facility] ENDOSCOPY;  Service: Pulmonary;;    Social History: Social History   Socioeconomic History   Marital status: Divorced    Spouse name: Burna Mortimer   Number of children: 1   Years of education: 10th   Highest education level: Not on file  Occupational History    Employer: [employer]    Comment: [employer]  Tobacco Use   Smoking status: Every Day    Packs/day: 1.00    Years: 40.00    Additional pack years: 0.00    Total pack years: 40.00    Types: Cigarettes   Smokeless tobacco: Never  Vaping Use   Vaping Use: Never used  Substance and Sexual Activity   Alcohol use: Yes    Comment: 2-3 cans beer/day   Drug use: No   Sexual activity: Not on file  Other Topics Concern   Not on file  Social History Narrative   Patient lives at home with his dog. 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 Patient has 10 grade education.Patient is  right-handed.Caffeine Use: 1 cup of coffee; 3-4 sodas daily   Social Determinants of Health   Financial Resource Strain: High Risk (05/10/2022)   Overall Financial Resource Strain (CARDIA)    Difficulty of Paying Living Expenses: Very hard  Food Insecurity: Food Insecurity Present (05/04/2022)   Hunger Vital Sign    Worried About Running Out of Food in the Last Year: Sometimes true    Ran Out of Food in the Last Year: Sometimes true  Transportation Needs: No Transportation Needs (05/04/2022)   PRAPARE - Administrator, Civil Service (Medical): No    Lack of Transportation (Non-Medical): No  Physical Activity: Not on file  Stress: Not on file  Social Connections: Not on file  Intimate Partner Violence: Not At Risk (05/04/2022)   Humiliation, Afraid, Rape, and Kick questionnaire    Fear of Current or Ex-Partner: No    Emotionally Abused: No    Physically Abused: No    Sexually Abused: No    Family History: Family History  Problem Relation Age of Onset   Dementia Mother    Cancer Father     Current Medications:  Current Outpatient Medications:    acetaminophen (TYLENOL) 500 MG tablet, Take 1,000 mg by mouth every 6 (six) hours as needed (pain.)., Disp: , Rfl:    albuterol (VENTOLIN HFA) 108 (90 Base) MCG/ACT inhaler, Inhale 2 puffs into the lungs every 6 (six) hours as needed for wheezing or shortness of breath., Disp: 8 g, Rfl: 6   aspirin EC 81 MG tablet, Take 81 mg by mouth at bedtime. Swallow whole., Disp: , Rfl:    CARBOPLATIN IV, Inject into the vein once a week., Disp: , Rfl:    clonazePAM (KLONOPIN) 1 MG disintegrating tablet, Take one tablet by mouth every morning and two tablets by mouth at bedtime, Disp: 90 tablet, Rfl: 2   meloxicam (MOBIC) 15 MG tablet, Take 15 mg by mouth in the morning., Disp: , Rfl:    PACLitaxel (TAXOL IV), Inject into the vein once a week., Disp: , Rfl:    pantoprazole (PROTONIX) 40 MG tablet, Take 40 mg by mouth daily., Disp: , Rfl:     prochlorperazine (COMPAZINE) 10 MG tablet, Take 1 tablet (10 mg total) by mouth every 6 (six) hours as needed for nausea or vomiting., Disp: 60 tablet, Rfl: 3  simvastatin (ZOCOR) 20 MG tablet, Take 20 mg by mouth at bedtime., Disp: , Rfl: 2   sucralfate (CARAFATE) 1 g tablet, Take 1 tablet (1 g total) by mouth 4 (four) times daily. Dissolve each tablet in 15 cc water before use., Disp: 120 tablet, Rfl: 2   tamsulosin (FLOMAX) 0.4 MG CAPS capsule, Take 1 capsule (0.4 mg total) by mouth daily. (Patient taking differently: Take 0.4 mg by mouth at bedtime.), Disp: 30 capsule, Rfl: 11   lidocaine-prilocaine (EMLA) cream, Apply a quarter-sized amount to port a cath site and cover with plastic wrap 1 hour prior to infusion appointments, Disp: 30 g, Rfl: 3   Allergies: Allergies  Allergen Reactions   Ciprofloxacin Swelling and Other (See Comments)    Swelling in hands, numbness in arms and joint pain   Gabapentin Other (See Comments)    Double vision    REVIEW OF SYSTEMS:   Review of Systems  Constitutional:  Negative for chills, fatigue and fever.  HENT:   Negative for lump/mass, mouth sores, nosebleeds, sore throat and trouble swallowing.   Eyes:  Negative for eye problems.  Respiratory:  Positive for cough and shortness of breath.   Cardiovascular:  Negative for chest pain, leg swelling and palpitations.  Gastrointestinal:  Positive for constipation. Negative for abdominal pain, diarrhea, nausea and vomiting.  Genitourinary:  Negative for bladder incontinence, difficulty urinating, dysuria, frequency, hematuria and nocturia.   Musculoskeletal:  Negative for arthralgias, back pain, flank pain, myalgias and neck pain.  Skin:  Negative for itching and rash.  Neurological:  Positive for dizziness. Negative for headaches and numbness.  Hematological:  Does not bruise/bleed easily.  Psychiatric/Behavioral:  Positive for sleep disturbance. Negative for depression and suicidal ideas. The patient  is not nervous/anxious.   All other systems reviewed and are negative.    VITALS:   There were no vitals taken for this visit.  Wt Readings from Last 3 Encounters:  06/21/22 183 lb 9.6 oz (83.3 kg)  06/14/22 184 lb (83.5 kg)  06/07/22 183 lb (83 kg)    There is no height or weight on file to calculate BMI.  Performance status (ECOG): 1 - Symptomatic but completely ambulatory  PHYSICAL EXAM:   Physical Exam Vitals and nursing note reviewed. Exam conducted with a chaperone present.  Constitutional:      Appearance: Normal appearance.  Cardiovascular:     Rate and Rhythm: Normal rate and regular rhythm.     Pulses: Normal pulses.     Heart sounds: Normal heart sounds.  Pulmonary:     Effort: Pulmonary effort is normal.     Breath sounds: Normal breath sounds.  Abdominal:     Palpations: Abdomen is soft. There is no hepatomegaly, splenomegaly or mass.     Tenderness: There is no abdominal tenderness.  Musculoskeletal:     Right lower leg: No edema.     Left lower leg: No edema.  Lymphadenopathy:     Cervical: No cervical adenopathy.     Right cervical: No superficial, deep or posterior cervical adenopathy.    Left cervical: No superficial, deep or posterior cervical adenopathy.     Upper Body:     Right upper body: No supraclavicular or axillary adenopathy.     Left upper body: No supraclavicular or axillary adenopathy.  Neurological:     General: No focal deficit present.     Mental Status: He is alert and oriented to person, place, and time.  Psychiatric:  Mood and Affect: Mood normal.        Behavior: Behavior normal.     LABS:      Latest Ref Rng & Units 06/21/2022    7:55 AM 06/14/2022    7:46 AM 06/07/2022    8:01 AM  CBC  WBC 4.0 - 10.5 K/uL 5.5  5.4  6.8   Hemoglobin 13.0 - 17.0 g/dL 74.2  59.5  63.8   Hematocrit 39.0 - 52.0 % 40.5  40.9  41.5   Platelets 150 - 400 K/uL 246  338  437       Latest Ref Rng & Units 06/21/2022    7:55 AM 06/14/2022     7:46 AM 06/07/2022    8:01 AM  CMP  Glucose 70 - 99 mg/dL 756  433  295   BUN 8 - 23 mg/dL 10  10  12    Creatinine 0.61 - 1.24 mg/dL 1.88  4.16  6.06   Sodium 135 - 145 mmol/L 132  132  132   Potassium 3.5 - 5.1 mmol/L 3.8  4.2  3.9   Chloride 98 - 111 mmol/L 100  100  101   CO2 22 - 32 mmol/L 24  22  22    Calcium 8.9 - 10.3 mg/dL 9.0  9.1  8.9   Total Protein 6.5 - 8.1 g/dL 6.9  7.0  7.2   Total Bilirubin 0.3 - 1.2 mg/dL 0.4  0.5  0.4   Alkaline Phos 38 - 126 U/L 56  58  60   AST 15 - 41 U/L 23  22  25    ALT 0 - 44 U/L 24  25  26       No results found for: "CEA1", "CEA" / No results found for: "CEA1", "CEA" Lab Results  Component Value Date   PSA1 4.7 (H) 04/12/2022   No results found for: "TKZ601" No results found for: "CAN125"  No results found for: "TOTALPROTELP", "ALBUMINELP", "A1GS", "A2GS", "BETS", "BETA2SER", "GAMS", "MSPIKE", "SPEI" No results found for: "TIBC", "FERRITIN", "IRONPCTSAT" No results found for: "LDH"   STUDIES:   No results found.

## 2022-06-21 ENCOUNTER — Other Ambulatory Visit: Payer: Self-pay

## 2022-06-21 ENCOUNTER — Ambulatory Visit
Admission: RE | Admit: 2022-06-21 | Discharge: 2022-06-21 | Disposition: A | Discharge status: Home / Self Care | Payer: Medicare Other | Source: Ambulatory Visit | Attending: Radiation Oncology | Admitting: Radiation Oncology

## 2022-06-21 ENCOUNTER — Inpatient Hospital Stay (HOSPITAL_BASED_OUTPATIENT_CLINIC_OR_DEPARTMENT_OTHER): Payer: Medicare Other | Admitting: Hematology

## 2022-06-21 ENCOUNTER — Inpatient Hospital Stay: Payer: Medicare Other

## 2022-06-21 VITALS — BP 137/85 | HR 86 | Temp 96.9°F | Resp 20

## 2022-06-21 DIAGNOSIS — Z95828 Presence of other vascular implants and grafts: Secondary | ICD-10-CM

## 2022-06-21 DIAGNOSIS — D3502 Benign neoplasm of left adrenal gland: Secondary | ICD-10-CM | POA: Diagnosis not present

## 2022-06-21 DIAGNOSIS — Z510 Encounter for antineoplastic radiation therapy: Secondary | ICD-10-CM | POA: Diagnosis not present

## 2022-06-21 DIAGNOSIS — C3491 Malignant neoplasm of unspecified part of right bronchus or lung: Secondary | ICD-10-CM | POA: Diagnosis not present

## 2022-06-21 DIAGNOSIS — C3411 Malignant neoplasm of upper lobe, right bronchus or lung: Secondary | ICD-10-CM | POA: Diagnosis not present

## 2022-06-21 DIAGNOSIS — F17210 Nicotine dependence, cigarettes, uncomplicated: Secondary | ICD-10-CM | POA: Diagnosis not present

## 2022-06-21 DIAGNOSIS — Z79899 Other long term (current) drug therapy: Secondary | ICD-10-CM | POA: Diagnosis not present

## 2022-06-21 DIAGNOSIS — Z5111 Encounter for antineoplastic chemotherapy: Secondary | ICD-10-CM | POA: Diagnosis not present

## 2022-06-21 LAB — COMPREHENSIVE METABOLIC PANEL
ALT: 24 U/L (ref 0–44)
AST: 23 U/L (ref 15–41)
Albumin: 3.9 g/dL (ref 3.5–5.0)
Alkaline Phosphatase: 56 U/L (ref 38–126)
Anion gap: 8 (ref 5–15)
BUN: 10 mg/dL (ref 8–23)
CO2: 24 mmol/L (ref 22–32)
Calcium: 9 mg/dL (ref 8.9–10.3)
Chloride: 100 mmol/L (ref 98–111)
Creatinine, Ser: 0.75 mg/dL (ref 0.61–1.24)
GFR, Estimated: >60 mL/min (ref >60)
Glucose, Bld: 143 mg/dL — ABNORMAL HIGH (ref 70–99)
Potassium: 3.8 mmol/L (ref 3.5–5.1)
Sodium: 132 mmol/L — ABNORMAL LOW (ref 135–145)
Total Bilirubin: 0.4 mg/dL (ref 0.3–1.2)
Total Protein: 6.9 g/dL (ref 6.5–8.1)

## 2022-06-21 LAB — RAD ONC ARIA SESSION SUMMARY
Course Elapsed Days: 30
Plan Fractions Treated to Date: 22
Plan Prescribed Dose Per Fraction: 2 Gy
Plan Total Fractions Prescribed: 30
Plan Total Prescribed Dose: 60 Gy
Reference Point Dosage Given to Date: 44 Gy
Reference Point Session Dosage Given: 2 Gy
Session Number: 22

## 2022-06-21 LAB — CBC WITH DIFFERENTIAL/PLATELET
Abs Immature Granulocytes: 0.03 10*3/uL (ref 0.00–0.07)
Basophils Absolute: 0.1 10*3/uL (ref 0.0–0.1)
Basophils Relative: 1 %
Eosinophils Absolute: 0.1 10*3/uL (ref 0.0–0.5)
Eosinophils Relative: 2 %
HCT: 40.5 % (ref 39.0–52.0)
Hemoglobin: 14.1 g/dL (ref 13.0–17.0)
Immature Granulocytes: 1 %
Lymphocytes Relative: 7 %
Lymphs Abs: 0.4 10*3/uL — ABNORMAL LOW (ref 0.7–4.0)
MCH: 31.5 pg (ref 26.0–34.0)
MCHC: 34.8 g/dL (ref 30.0–36.0)
MCV: 90.6 fL (ref 80.0–100.0)
Monocytes Absolute: 0.6 10*3/uL (ref 0.1–1.0)
Monocytes Relative: 10 %
Neutro Abs: 4.4 10*3/uL (ref 1.7–7.7)
Neutrophils Relative %: 79 %
Platelets: 246 10*3/uL (ref 150–400)
RBC: 4.47 MIL/uL (ref 4.22–5.81)
RDW: 14.6 % (ref 11.5–15.5)
WBC: 5.5 10*3/uL (ref 4.0–10.5)
nRBC: 0 % (ref 0.0–0.2)

## 2022-06-21 LAB — MAGNESIUM: Magnesium: 1.8 mg/dL (ref 1.7–2.4)

## 2022-06-21 MED ORDER — SODIUM CHLORIDE 0.9 % IV SOLN
220.0000 mg | Freq: Once | INTRAVENOUS | Status: AC
  Administered 2022-06-21: 220 mg via INTRAVENOUS
  Filled 2022-06-21: qty 22

## 2022-06-21 MED ORDER — CETIRIZINE HCL 10 MG/ML IV SOLN
10.0000 mg | Freq: Once | INTRAVENOUS | Status: AC
  Administered 2022-06-21: 10 mg via INTRAVENOUS
  Filled 2022-06-21: qty 1

## 2022-06-21 MED ORDER — SODIUM CHLORIDE 0.9 % IV SOLN
45.0000 mg/m2 | Freq: Once | INTRAVENOUS | Status: AC
  Administered 2022-06-21: 90 mg via INTRAVENOUS
  Filled 2022-06-21: qty 15

## 2022-06-21 MED ORDER — FAMOTIDINE IN NACL 20-0.9 MG/50ML-% IV SOLN
20.0000 mg | Freq: Once | INTRAVENOUS | Status: AC
  Administered 2022-06-21: 20 mg via INTRAVENOUS
  Filled 2022-06-21: qty 50

## 2022-06-21 MED ORDER — SODIUM CHLORIDE 0.9 % IV SOLN: Freq: Once | INTRAVENOUS | Status: AC

## 2022-06-21 MED ORDER — HEPARIN SOD (PORK) LOCK FLUSH 100 UNIT/ML IV SOLN
500.0000 [IU] | Freq: Once | INTRAVENOUS | Status: AC | PRN
  Administered 2022-06-21: 500 [IU]

## 2022-06-21 MED ORDER — SODIUM CHLORIDE 0.9% FLUSH
10.0000 mL | INTRAVENOUS | Status: DC | PRN
  Administered 2022-06-21: 10 mL

## 2022-06-21 MED ORDER — SODIUM CHLORIDE 0.9 % IV SOLN
10.0000 mg | Freq: Once | INTRAVENOUS | Status: AC
  Administered 2022-06-21: 10 mg via INTRAVENOUS
  Filled 2022-06-21: qty 10

## 2022-06-21 MED ORDER — PALONOSETRON HCL INJECTION 0.25 MG/5ML
0.2500 mg | Freq: Once | INTRAVENOUS | Status: AC
  Administered 2022-06-21: 0.25 mg via INTRAVENOUS
  Filled 2022-06-21: qty 5

## 2022-06-21 NOTE — Progress Notes (Signed)
Patient has been examined by Dr. Ellin Saba. Vital signs (HR 111) and labs have been reviewed by MD - ANC, Creatinine, LFTs, hemoglobin, and platelets are within treatment parameters per M.D. - pt may proceed with treatment.  Primary RN and pharmacy notified.

## 2022-06-21 NOTE — Patient Instructions (Signed)
[facility]  Discharge Instructions: Thank you for choosing [facility] to provide your oncology and hematology care.  If you have a lab appointment with the Cancer Center - please note that after April 8th, 2024, all labs will be drawn in the cancer center.  You do not have to check in or register with the main entrance as you have in the past but will complete your check-in in the cancer center.  Wear comfortable clothing and clothing appropriate for easy access to any Portacath or PICC line.   We strive to give you quality time with your provider. You may need to reschedule your appointment if you arrive late (15 or more minutes).  Arriving late affects you and other patients whose appointments are after yours.  Also, if you miss three or more appointments without notifying the office, you may be dismissed from the clinic at the provider's discretion.      For prescription refill requests, have your pharmacy contact our office and allow 72 hours for refills to be completed.    Today you received the following chemotherapy and/or immunotherapy agents Paclitaxel and Carboplatin   To help prevent nausea and vomiting after your treatment, we encourage you to take your nausea medication as directed.  Paclitaxel Injection What is this medication? PACLITAXEL (PAK li TAX el) treats some types of cancer. It works by slowing down the growth of cancer cells. This medicine may be used for other purposes; ask your health care provider or pharmacist if you have questions. COMMON BRAND NAME(S): Onxol, Taxol What should I tell my care team before I take this medication? They need to know if you have any of these conditions: Heart disease Liver disease Low white blood cell levels An unusual or allergic reaction to paclitaxel, other medications, foods, dyes, or preservatives If you or your partner are pregnant or trying to get pregnant Breast-feeding How should I use  this medication? This medication is injected into a vein. It is given by your care team in a hospital or clinic setting. Talk to your care team about the use of this medication in children. While it may be given to children for selected conditions, precautions do apply. Overdosage: If you think you have taken too much of this medicine contact a poison control center or emergency room at once. NOTE: This medicine is only for you. Do not share this medicine with others. What if I miss a dose? Keep appointments for follow-up doses. It is important not to miss your dose. Call your care team if you are unable to keep an appointment. What may interact with this medication? Do not take this medication with any of the following: Live virus vaccines Other medications may affect the way this medication works. Talk with your care team about all of the medications you take. They may suggest changes to your treatment plan to lower the risk of side effects and to make sure your medications work as intended. This list may not describe all possible interactions. Give your health care provider a list of all the medicines, herbs, non-prescription drugs, or dietary supplements you use. Also tell them if you smoke, drink alcohol, or use illegal drugs. Some items may interact with your medicine. What should I watch for while using this medication? Your condition will be monitored carefully while you are receiving this medication. You may need blood work while taking this medication. This medication may make you feel generally unwell. This is not  uncommon as chemotherapy can affect healthy cells as well as cancer cells. Report any side effects. Continue your course of treatment even though you feel ill unless your care team tells you to stop. This medication can cause serious allergic reactions. To reduce the risk, your care team may give you other medications to take before receiving this one. Be sure to follow the  directions from your care team. This medication may increase your risk of getting an infection. Call your care team for advice if you get a fever, chills, sore throat, or other symptoms of a cold or flu. Do not treat yourself. Try to avoid being around people who are sick. This medication may increase your risk to bruise or bleed. Call your care team if you notice any unusual bleeding. Be careful brushing or flossing your teeth or using a toothpick because you may get an infection or bleed more easily. If you have any dental work done, tell your dentist you are receiving this medication. Talk to your care team if you may be pregnant. Serious birth defects can occur if you take this medication during pregnancy. Talk to your care team before breastfeeding. Changes to your treatment plan may be needed. What side effects may I notice from receiving this medication? Side effects that you should report to your care team as soon as possible: Allergic reactions--skin rash, itching, hives, swelling of the face, lips, tongue, or throat Heart rhythm changes--fast or irregular heartbeat, dizziness, feeling faint or lightheaded, chest pain, trouble breathing Increase in blood pressure Infection--fever, chills, cough, sore throat, wounds that don't heal, pain or trouble when passing urine, general feeling of discomfort or being unwell Low blood pressure--dizziness, feeling faint or lightheaded, blurry vision Low red blood cell level--unusual weakness or fatigue, dizziness, headache, trouble breathing Painful swelling, warmth, or redness of the skin, blisters or sores at the infusion site Pain, tingling, or numbness in the hands or feet Slow heartbeat--dizziness, feeling faint or lightheaded, confusion, trouble breathing, unusual weakness or fatigue Unusual bruising or bleeding Side effects that usually do not require medical attention (report to your care team if they continue or are bothersome): Diarrhea Hair  loss Joint pain Loss of appetite Muscle pain Nausea Vomiting This list may not describe all possible side effects. Call your doctor for medical advice about side effects. You may report side effects to FDA at 1-800-FDA-1088. Where should I keep my medication? This medication is given in a hospital or clinic. It will not be stored at home. NOTE: This sheet is a summary. It may not cover all possible information. If you have questions about this medicine, talk to your doctor, pharmacist, or health care provider.  2024 Elsevier/Gold Standard (2021-05-16 00:00:00)  Carboplatin Injection What is this medication? CARBOPLATIN (KAR boe pla tin) treats some types of cancer. It works by slowing down the growth of cancer cells. This medicine may be used for other purposes; ask your health care provider or pharmacist if you have questions. COMMON BRAND NAME(S): Paraplatin What should I tell my care team before I take this medication? They need to know if you have any of these conditions: Blood disorders Hearing problems Kidney disease Recent or ongoing radiation therapy An unusual or allergic reaction to carboplatin, cisplatin, other medications, foods, dyes, or preservatives Pregnant or trying to get pregnant Breast-feeding How should I use this medication? This medication is injected into a vein. It is given by your care team in a hospital or clinic setting. Talk to your care  team about the use of this medication in children. Special care may be needed. Overdosage: If you think you have taken too much of this medicine contact a poison control center or emergency room at once. NOTE: This medicine is only for you. Do not share this medicine with others. What if I miss a dose? Keep appointments for follow-up doses. It is important not to miss your dose. Call your care team if you are unable to keep an appointment. What may interact with this medication? Medications for seizures Some  antibiotics, such as amikacin, gentamicin, neomycin, streptomycin, tobramycin Vaccines This list may not describe all possible interactions. Give your health care provider a list of all the medicines, herbs, non-prescription drugs, or dietary supplements you use. Also tell them if you smoke, drink alcohol, or use illegal drugs. Some items may interact with your medicine. What should I watch for while using this medication? Your condition will be monitored carefully while you are receiving this medication. You may need blood work while taking this medication. This medication may make you feel generally unwell. This is not uncommon, as chemotherapy can affect healthy cells as well as cancer cells. Report any side effects. Continue your course of treatment even though you feel ill unless your care team tells you to stop. In some cases, you may be given additional medications to help with side effects. Follow all directions for their use. This medication may increase your risk of getting an infection. Call your care team for advice if you get a fever, chills, sore throat, or other symptoms of a cold or flu. Do not treat yourself. Try to avoid being around people who are sick. Avoid taking medications that contain aspirin, acetaminophen, ibuprofen, naproxen, or ketoprofen unless instructed by your care team. These medications may hide a fever. Be careful brushing or flossing your teeth or using a toothpick because you may get an infection or bleed more easily. If you have any dental work done, tell your dentist you are receiving this medication. Talk to your care team if you wish to become pregnant or think you might be pregnant. This medication can cause serious birth defects. Talk to your care team about effective forms of contraception. Do not breast-feed while taking this medication. What side effects may I notice from receiving this medication? Side effects that you should report to your care team as  soon as possible: Allergic reactions--skin rash, itching, hives, swelling of the face, lips, tongue, or throat Infection--fever, chills, cough, sore throat, wounds that don't heal, pain or trouble when passing urine, general feeling of discomfort or being unwell Low red blood cell level--unusual weakness or fatigue, dizziness, headache, trouble breathing Pain, tingling, or numbness in the hands or feet, muscle weakness, change in vision, confusion or trouble speaking, loss of balance or coordination, trouble walking, seizures Unusual bruising or bleeding Side effects that usually do not require medical attention (report to your care team if they continue or are bothersome): Hair loss Nausea Unusual weakness or fatigue Vomiting This list may not describe all possible side effects. Call your doctor for medical advice about side effects. You may report side effects to FDA at 1-800-FDA-1088. Where should I keep my medication? This medication is given in a hospital or clinic. It will not be stored at home. NOTE: This sheet is a summary. It may not cover all possible information. If you have questions about this medicine, talk to your doctor, pharmacist, or health care provider.  2024 Elsevier/Gold Standard (2021-04-18  00:00:00)    BELOW ARE SYMPTOMS THAT SHOULD BE REPORTED IMMEDIATELY: *FEVER GREATER THAN 100.4 F (38 C) OR HIGHER *CHILLS OR SWEATING *NAUSEA AND VOMITING THAT IS NOT CONTROLLED WITH YOUR NAUSEA MEDICATION *UNUSUAL SHORTNESS OF BREATH *UNUSUAL BRUISING OR BLEEDING *URINARY PROBLEMS (pain or burning when urinating, or frequent urination) *BOWEL PROBLEMS (unusual diarrhea, constipation, pain near the anus) TENDERNESS IN MOUTH AND THROAT WITH OR WITHOUT PRESENCE OF ULCERS (sore throat, sores in mouth, or a toothache) UNUSUAL RASH, SWELLING OR PAIN  UNUSUAL VAGINAL DISCHARGE OR ITCHING   Items with * indicate a potential emergency and should be followed up as soon as possible or  go to the Emergency Department if any problems should occur.  Please show the CHEMOTHERAPY ALERT CARD or IMMUNOTHERAPY ALERT CARD at check-in to the Emergency Department and triage nurse.  Should you have questions after your visit or need to cancel or reschedule your appointment, please contact [facility] [phone]  and follow the prompts.  Office hours are 8:00 a.m. to 4:30 p.m. Monday - Friday. Please note that voicemails left after 4:00 p.m. may not be returned until the following business day.  We are closed weekends and major holidays. You have access to a nurse at all times for urgent questions. Please call the main number to the clinic [phone] and follow the prompts.  For any non-urgent questions, you may also contact your provider using MyChart. We now offer e-Visits for anyone 21 and older to request care online for non-urgent symptoms. For details visit [email or web address].   Also download the MyChart app! Go to the app store, search "MyChart", open the app, select [place], and log in with your MyChart username and password.

## 2022-06-21 NOTE — Patient Instructions (Signed)
[facility] at [facility] Discharge Instructions   You were seen and examined today by Dr. Katragadda.  He reviewed the results of your lab work which are normal/stable.   We will proceed with your treatment today.  Return as scheduled.    Thank you for choosing [facility] at [facility] to provide your oncology and hematology care.  To afford each patient quality time with our provider, please arrive at least 15 minutes before your scheduled appointment time.   If you have a lab appointment with the Cancer Center please come in thru the Main Entrance and check in at the main information desk.  You need to re-schedule your appointment should you arrive 10 or more minutes late.  We strive to give you quality time with our providers, and arriving late affects you and other patients whose appointments are after yours.  Also, if you no show three or more times for appointments you may be dismissed from the clinic at the providers discretion.     Again, thank you for choosing [facility].  Our hope is that these requests will decrease the amount of time that you wait before being seen by our physicians.       _____________________________________________________________  Should you have questions after your visit to  Cancer Center, please contact our office at [phone] and follow the prompts.  Our office hours are 8:00 a.m. and 4:30 p.m. Monday - Friday.  Please note that voicemails left after 4:00 p.m. may not be returned until the following business day.  We are closed weekends and major holidays.  You do have access to a nurse 24-7, just call the main number to the clinic [phone] and do not press any options, hold on the line and a nurse will answer the phone.    For prescription refill requests, have your pharmacy contact our office and allow 72 hours.    Due to Covid, you will need to wear a mask upon entering  the hospital. If you do not have a mask, a mask will be given to you at the Main Entrance upon arrival. For doctor visits, patients may have 1 support person age 18 or older with them. For treatment visits, patients can not have anyone with them due to social distancing guidelines and our immunocompromised population.      

## 2022-06-21 NOTE — Progress Notes (Signed)
Patient presents today for Paclitaxel and Carboplatin infusion. Patient is in satisfactory condition with no new complaints voiced.  Vital signs are stable. Pt's HR is 111, Dr.K aware. Labs reviewed by Dr. Ellin Saba during the office visit and all labs are within treatment parameters.  We will proceed with treatment per MD orders.   Treatment given today per MD orders. Tolerated infusion without adverse affects. Vital signs stable. No complaints at this time. Discharged from clinic ambulatory in stable condition. Alert and oriented x 3. F/U with [facility] as scheduled.

## 2022-06-22 ENCOUNTER — Other Ambulatory Visit: Payer: Self-pay

## 2022-06-22 ENCOUNTER — Ambulatory Visit
Admission: RE | Admit: 2022-06-22 | Discharge: 2022-06-22 | Disposition: A | Discharge status: Home / Self Care | Payer: Medicare Other | Source: Ambulatory Visit | Attending: Radiation Oncology | Admitting: Radiation Oncology

## 2022-06-22 DIAGNOSIS — D3502 Benign neoplasm of left adrenal gland: Secondary | ICD-10-CM | POA: Diagnosis not present

## 2022-06-22 DIAGNOSIS — F17210 Nicotine dependence, cigarettes, uncomplicated: Secondary | ICD-10-CM | POA: Diagnosis not present

## 2022-06-22 DIAGNOSIS — C3411 Malignant neoplasm of upper lobe, right bronchus or lung: Secondary | ICD-10-CM | POA: Diagnosis not present

## 2022-06-22 DIAGNOSIS — Z5111 Encounter for antineoplastic chemotherapy: Secondary | ICD-10-CM | POA: Diagnosis not present

## 2022-06-22 DIAGNOSIS — Z510 Encounter for antineoplastic radiation therapy: Secondary | ICD-10-CM | POA: Diagnosis not present

## 2022-06-22 DIAGNOSIS — Z79899 Other long term (current) drug therapy: Secondary | ICD-10-CM | POA: Diagnosis not present

## 2022-06-22 LAB — RAD ONC ARIA SESSION SUMMARY
Course Elapsed Days: 31
Plan Fractions Treated to Date: 23
Plan Prescribed Dose Per Fraction: 2 Gy
Plan Total Fractions Prescribed: 30
Plan Total Prescribed Dose: 60 Gy
Reference Point Dosage Given to Date: 46 Gy
Reference Point Session Dosage Given: 2 Gy
Session Number: 23

## 2022-06-25 ENCOUNTER — Ambulatory Visit
Admission: RE | Admit: 2022-06-25 | Discharge: 2022-06-25 | Disposition: A | Discharge status: Home / Self Care | Payer: Medicare Other | Source: Ambulatory Visit | Attending: Radiation Oncology | Admitting: Radiation Oncology

## 2022-06-25 ENCOUNTER — Other Ambulatory Visit: Payer: Self-pay

## 2022-06-25 DIAGNOSIS — Z79899 Other long term (current) drug therapy: Secondary | ICD-10-CM | POA: Diagnosis not present

## 2022-06-25 DIAGNOSIS — C3411 Malignant neoplasm of upper lobe, right bronchus or lung: Secondary | ICD-10-CM | POA: Diagnosis not present

## 2022-06-25 DIAGNOSIS — D3502 Benign neoplasm of left adrenal gland: Secondary | ICD-10-CM | POA: Diagnosis not present

## 2022-06-25 DIAGNOSIS — Z5111 Encounter for antineoplastic chemotherapy: Secondary | ICD-10-CM | POA: Diagnosis not present

## 2022-06-25 DIAGNOSIS — F17210 Nicotine dependence, cigarettes, uncomplicated: Secondary | ICD-10-CM | POA: Diagnosis not present

## 2022-06-25 DIAGNOSIS — Z510 Encounter for antineoplastic radiation therapy: Secondary | ICD-10-CM | POA: Diagnosis not present

## 2022-06-25 LAB — RAD ONC ARIA SESSION SUMMARY
Course Elapsed Days: 34
Plan Fractions Treated to Date: 24
Plan Prescribed Dose Per Fraction: 2 Gy
Plan Total Fractions Prescribed: 30
Plan Total Prescribed Dose: 60 Gy
Reference Point Dosage Given to Date: 48 Gy
Reference Point Session Dosage Given: 2 Gy
Session Number: 24

## 2022-06-26 ENCOUNTER — Other Ambulatory Visit: Payer: Self-pay

## 2022-06-26 ENCOUNTER — Ambulatory Visit
Admission: RE | Admit: 2022-06-26 | Discharge: 2022-06-26 | Disposition: A | Discharge status: Home / Self Care | Payer: Medicare Other | Source: Ambulatory Visit | Attending: Radiation Oncology | Admitting: Radiation Oncology

## 2022-06-26 DIAGNOSIS — Z79899 Other long term (current) drug therapy: Secondary | ICD-10-CM | POA: Diagnosis not present

## 2022-06-26 DIAGNOSIS — C3411 Malignant neoplasm of upper lobe, right bronchus or lung: Secondary | ICD-10-CM | POA: Diagnosis not present

## 2022-06-26 DIAGNOSIS — Z510 Encounter for antineoplastic radiation therapy: Secondary | ICD-10-CM | POA: Diagnosis not present

## 2022-06-26 DIAGNOSIS — F17210 Nicotine dependence, cigarettes, uncomplicated: Secondary | ICD-10-CM | POA: Diagnosis not present

## 2022-06-26 DIAGNOSIS — D3502 Benign neoplasm of left adrenal gland: Secondary | ICD-10-CM | POA: Diagnosis not present

## 2022-06-26 DIAGNOSIS — Z5111 Encounter for antineoplastic chemotherapy: Secondary | ICD-10-CM | POA: Diagnosis not present

## 2022-06-26 LAB — RAD ONC ARIA SESSION SUMMARY
Course Elapsed Days: 35
Plan Fractions Treated to Date: 25
Plan Prescribed Dose Per Fraction: 2 Gy
Plan Total Fractions Prescribed: 30
Plan Total Prescribed Dose: 60 Gy
Reference Point Dosage Given to Date: 50 Gy
Reference Point Session Dosage Given: 2 Gy
Session Number: 25

## 2022-06-27 ENCOUNTER — Other Ambulatory Visit: Payer: Self-pay

## 2022-06-27 ENCOUNTER — Ambulatory Visit
Admission: RE | Admit: 2022-06-27 | Discharge: 2022-06-27 | Disposition: A | Discharge status: Home / Self Care | Payer: Medicare Other | Source: Ambulatory Visit | Attending: Radiation Oncology | Admitting: Radiation Oncology

## 2022-06-27 DIAGNOSIS — F17210 Nicotine dependence, cigarettes, uncomplicated: Secondary | ICD-10-CM | POA: Diagnosis not present

## 2022-06-27 DIAGNOSIS — Z5111 Encounter for antineoplastic chemotherapy: Secondary | ICD-10-CM | POA: Diagnosis not present

## 2022-06-27 DIAGNOSIS — Z79899 Other long term (current) drug therapy: Secondary | ICD-10-CM | POA: Diagnosis not present

## 2022-06-27 DIAGNOSIS — Z510 Encounter for antineoplastic radiation therapy: Secondary | ICD-10-CM | POA: Diagnosis not present

## 2022-06-27 DIAGNOSIS — C3411 Malignant neoplasm of upper lobe, right bronchus or lung: Secondary | ICD-10-CM | POA: Diagnosis not present

## 2022-06-27 DIAGNOSIS — D3502 Benign neoplasm of left adrenal gland: Secondary | ICD-10-CM | POA: Diagnosis not present

## 2022-06-27 LAB — RAD ONC ARIA SESSION SUMMARY
Course Elapsed Days: 36
Plan Fractions Treated to Date: 26
Plan Prescribed Dose Per Fraction: 2 Gy
Plan Total Fractions Prescribed: 30
Plan Total Prescribed Dose: 60 Gy
Reference Point Dosage Given to Date: 52 Gy
Reference Point Session Dosage Given: 2 Gy
Session Number: 26

## 2022-06-27 NOTE — Progress Notes (Signed)
[facility] [address], [place] [address]    Clinic Day:  06/28/2022  Referring physician: Benita Stabile, MD  Patient Care Team: Benita Stabile, MD as PCP - General (Internal Medicine) Doreatha Massed, MD as Medical Oncologist (Medical Oncology) Therese Sarah, RN as Oncology Nurse Navigator (Medical Oncology)   ASSESSMENT & PLAN:   Assessment: 1.  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  No hemoptysis/weight loss. - Chest x-ray (04/10/2022): Mid right lung mass. - CT chest (04/13/2022): 9.2 x 5.7 x 6.6 cm right upper lobe mass with lobulations appearing to extend across the minor fissure into the right middle lobe and right lower lobe.  Satellite lesion in the right upper lobe measures 1.6 x 1.4 x 1.4 cm.  No findings of chest wall invasion.  Borderline prominent lower right paratracheal lymph node 0.9 cm.  Old granulomatous disease.  Small left adrenal adenoma. - Has right posterior chest wall pain for the past 6 months, dull type.  He is not requiring any pain medication. - Brain MRI (04/19/2022): No metastatic disease. - PET scan (04/19/2022): 9 x 5.7 cm right upper lobe mass with SUV 11.8.  1.3 x 0.9 cm satellite nodule in the right upper lobe with SUV 3.8.  No adenopathy or distant metastatic disease. - Bronchoscopy and biopsy by Dr. Delton Coombes - Pathology (04/23/2022): Both right upper lobe lung nodule FNA consistent with poorly differentiated carcinoma.  IHC diffusely positive for TTF-1, Napsin, synaptophysin and CK7.  CD56 negative.  P63 and CK5/6 negative.  Given Napsin positivity, favor adenocarcinoma.  There is also diffuse synaptophysin positivity consistent with neuroendocrine differentiation.  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 - Discussed with Dr. Dorris Fetch.  Not a surgical candidate as he requires pneumonectomy and has  suboptimal DLCO. - Chemoradiation with carboplatin and paclitaxel started on 05/22/2022   2.  Social/family history: - He is seen with his 2 sisters today.  Lives by himself and is independent of ADLs and IADLs.  He retired after working at [employer] for the last 34 years.  No asbestos exposure.  No other chemicals.  Smokes more than 1 pack/day for the last 50 years (started at age 55) quit smoking for 5 years before starting back again. - Brother died of lung cancer.  Father had bladder cancer.  Maternal uncle had cancer.    Plan: 1.  Stage IIIa (T4 N0 M0) adenocarcinoma of the right lung: - He had nausea after eating and vomited once after last treatment. - Reviewed labs today: Normal LFTs and creatinine.  CBC grossly normal. - Proceed with week 6 treatment today.  RTC 1 week for last treatment.  XRT will end on 07/06/2022.   2.  Anxiety/depression: - Continue clonazepam 1 mg twice daily.  This is well-controlled.   3.  COPD: - He has cough with expectoration in the mornings.  Use albuterol inhaler as needed.    No orders of the defined types were placed in this encounter.     I,Katie Daubenspeck,acting as a Neurosurgeon for Doreatha Massed, MD.,have documented all relevant documentation on the behalf of Doreatha Massed, MD,as directed by  Doreatha Massed, MD while in the presence of Doreatha Massed, MD.   I, Doreatha Massed MD, have reviewed the above documentation for accuracy and completeness, and I agree with the above.   Doreatha Massed, MD  6/20/20246:38 PM  CHIEF COMPLAINT:   Diagnosis: RUL lung adenocarcinoma    Cancer Staging  Non-small cell cancer of right lung (HCC) Staging form: Lung, AJCC 8th Edition - Clinical stage from 05/01/2022: Stage IIIA (cT4, cN0, cM0) - Unsigned    Prior Therapy: none  Current Therapy:  concurrent chemoradiation with carboplatin + paclitaxel    HISTORY OF PRESENT ILLNESS:   Oncology History  Non-small  cell cancer of right lung (HCC)  05/01/2022 Initial Diagnosis   Non-small cell cancer of right lung (HCC)   05/22/2022 -  Chemotherapy   Patient is on Treatment Plan : LUNG Carboplatin + Paclitaxel + XRT q7d        INTERVAL HISTORY:   Carlos [name] is a 67 y.o. male presenting to clinic today for follow up of RUL lung adenocarcinoma. He was last seen by me on 06/21/22.  Today, he states that he is doing well overall. His appetite level is at 100%. His energy level is at 100%.  PAST MEDICAL HISTORY:   Past Medical History: Past Medical History:  Diagnosis Date   Anxiety    Cancer (HCC)    Skin cancer left shoulder   Chronic low back pain    Complication of anesthesia    Dermatofibrosarcoma protubera of right shoulder    Dyspnea    GERD (gastroesophageal reflux disease)    Hyperlipidemia    Lung cancer (HCC) 04/23/2022   PONV (postoperative nausea and vomiting)    Port-A-Cath in place 05/17/2022   Prostate cancer [facility])     Surgical History: Past Surgical History:  Procedure Laterality Date   APPENDECTOMY     APPLICATION OF A-CELL OF EXTREMITY Right 07/09/2012   Procedure: PLACEMENT OF A-CELL TO UPPER EXTREMITY/PLACEMENT OF VAC;  Surgeon: Wayland Denis, DO;  Location: [facility];  Service: Plastics;  Laterality: Right;   BRONCHIAL BRUSHINGS  04/23/2022   Procedure: BRONCHIAL BRUSHINGS;  Surgeon: Leslye Peer, MD;  Location: [facility];  Service: Pulmonary;;   BRONCHIAL NEEDLE ASPIRATION BIOPSY  04/23/2022   Procedure: BRONCHIAL NEEDLE ASPIRATION BIOPSIES;  Surgeon: Leslye Peer, MD;  Location: [facility] ENDOSCOPY;  Service: Pulmonary;;   COLONOSCOPY WITH PROPOFOL N/A 09/30/2015   Procedure: COLONOSCOPY WITH PROPOFOL;  Surgeon: Malissa Hippo, MD;  Location: [facility];  Service: Endoscopy;  Laterality: N/A;  830   EXCISION DERMATOFIBROCARCOMA PROTUBERAN RIGHT SHOULDER  2 - 3 WKS AGO   HEMOSTASIS CONTROL  04/23/2022   Procedure: HEMOSTASIS CONTROL;  Surgeon: Leslye Peer, MD;  Location: [facility] ENDOSCOPY;  Service: Pulmonary;;   INGUINAL HERNIA REPAIR Left 1999   IR IMAGING GUIDED PORT INSERTION  04/30/2022   KNEE ARTHROSCOPY Left 2012   POLYPECTOMY  09/30/2015   Procedure: POLYPECTOMY;  Surgeon: Malissa Hippo, MD;  Location: [facility];  Service: Endoscopy;;  sigmoid   VIDEO BRONCHOSCOPY WITH ENDOBRONCHIAL ULTRASOUND Bilateral 04/23/2022   Procedure: VIDEO BRONCHOSCOPY WITH ENDOBRONCHIAL ULTRASOUND;  Surgeon: Leslye Peer, MD;  Location: [facility] ENDOSCOPY;  Service: Pulmonary;  Laterality: Bilateral;   VIDEO BRONCHOSCOPY WITH RADIAL ENDOBRONCHIAL ULTRASOUND  04/23/2022   Procedure: VIDEO BRONCHOSCOPY WITH RADIAL ENDOBRONCHIAL ULTRASOUND;  Surgeon: Leslye Peer, MD;  Location: [facility];  Service: Pulmonary;;    Social History: Social History   Socioeconomic History   Marital status: Divorced    Spouse name: Burna Mortimer   Number of children: 1   Years of education: 10th   Highest education level: Not on file  Occupational History    Employer: [employer]    Comment: [employer]  Tobacco Use   Smoking status: Every Day    Packs/day: 1.00    Years: 40.00    Additional pack years: 0.00    Total pack years: 40.00    Types: Cigarettes   Smokeless tobacco: Never  Vaping Use   Vaping Use: Never used  Substance and Sexual Activity   Alcohol use: Yes    Comment: 2-3 cans beer/day   Drug use: No   Sexual activity: Not on file  Other Topics Concern   Not on file  Social History Narrative   Patient lives at home with his dog. 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 Patient has 10 grade education.Patient is right-handed.Caffeine Use: 1 cup of coffee; 3-4 sodas daily   Social Determinants of Health   Financial Resource Strain: High Risk (05/10/2022)   Overall Financial Resource Strain (CARDIA)    Difficulty of Paying Living Expenses: Very hard  Food Insecurity: Food Insecurity Present (05/04/2022)    Hunger Vital Sign    Worried About Running Out of Food in the Last Year: Sometimes true    Ran Out of Food in the Last Year: Sometimes true  Transportation Needs: No Transportation Needs (05/04/2022)   PRAPARE - Administrator, Civil Service (Medical): No    Lack of Transportation (Non-Medical): No  Physical Activity: Not on file  Stress: Not on file  Social Connections: Not on file  Intimate Partner Violence: Not At Risk (05/04/2022)   Humiliation, Afraid, Rape, and Kick questionnaire    Fear of Current or Ex-Partner: No    Emotionally Abused: No    Physically Abused: No    Sexually Abused: No    Family History: Family History  Problem Relation Age of Onset   Dementia Mother    Cancer Father     Current Medications:  Current Outpatient Medications:    acetaminophen (TYLENOL) 500 MG tablet, Take 1,000 mg by mouth every 6 (six) hours as needed (pain.)., Disp: , Rfl:    albuterol (VENTOLIN HFA) 108 (90 Base) MCG/ACT inhaler, Inhale 2 puffs into the lungs every 6 (six) hours as needed for wheezing or shortness of breath., Disp: 8 g, Rfl: 6   aspirin EC 81 MG tablet, Take 81 mg by mouth at bedtime. Swallow whole., Disp: , Rfl:    CARBOPLATIN IV, Inject into the vein once a week., Disp: , Rfl:    clonazePAM (KLONOPIN) 1 MG disintegrating tablet, Take one tablet by mouth every morning and two tablets by mouth at bedtime, Disp: 90 tablet, Rfl: 2   lidocaine-prilocaine (EMLA) cream, Apply a quarter-sized amount to port a cath site and cover with plastic wrap 1 hour prior to infusion appointments, Disp: 30 g, Rfl: 3   meloxicam (MOBIC) 15 MG tablet, Take 15 mg by mouth in the morning., Disp: , Rfl:    PACLitaxel (TAXOL IV), Inject into the vein once a week., Disp: , Rfl:    pantoprazole (PROTONIX) 40 MG tablet, Take 40 mg by mouth daily., Disp: , Rfl:    prochlorperazine (COMPAZINE) 10 MG tablet, Take 1 tablet (10 mg total) by mouth every 6 (six) hours as needed for nausea or  vomiting., Disp: 60 tablet, Rfl: 3   simvastatin (ZOCOR) 20 MG tablet, Take 20 mg by mouth at bedtime., Disp: , Rfl: 2   sucralfate (CARAFATE) 1 g tablet, Take 1 tablet (1 g total) by mouth 4 (four) times daily. Dissolve each  tablet in 15 cc water before use., Disp: 120 tablet, Rfl: 2   tamsulosin (FLOMAX) 0.4 MG CAPS capsule, Take 1 capsule (0.4 mg total) by mouth daily. (Patient taking differently: Take 0.4 mg by mouth at bedtime.), Disp: 30 capsule, Rfl: 11   Allergies: Allergies  Allergen Reactions   Ciprofloxacin Swelling and Other (See Comments)    Swelling in hands, numbness in arms and joint pain   Gabapentin Other (See Comments)    Double vision    REVIEW OF SYSTEMS:   Review of Systems  Constitutional:  Negative for chills, fatigue and fever.  HENT:   Positive for sore throat. Negative for lump/mass, mouth sores, nosebleeds and trouble swallowing.   Eyes:  Negative for eye problems.  Respiratory:  Positive for cough and shortness of breath.   Cardiovascular:  Negative for chest pain, leg swelling and palpitations.  Gastrointestinal:  Positive for constipation and nausea. Negative for abdominal pain, diarrhea and vomiting.  Genitourinary:  Negative for bladder incontinence, difficulty urinating, dysuria, frequency, hematuria and nocturia.   Musculoskeletal:  Negative for arthralgias, back pain, flank pain, myalgias and neck pain.  Skin:  Negative for itching and rash.  Neurological:  Positive for dizziness and headaches. Negative for numbness.  Hematological:  Does not bruise/bleed easily.  Psychiatric/Behavioral:  Negative for depression, sleep disturbance and suicidal ideas. The patient is not nervous/anxious.   All other systems reviewed and are negative.    VITALS:   Blood pressure (!) 140/76, pulse 94, temperature (!) 97 F (36.1 C), temperature source Tympanic, resp. rate 20, weight 183 lb 9.6 oz (83.3 kg), SpO2 100 %.  Wt Readings from Last 3 Encounters:  06/28/22  183 lb 9.6 oz (83.3 kg)  06/21/22 183 lb 9.6 oz (83.3 kg)  06/14/22 184 lb (83.5 kg)    Body mass index is 29.63 kg/m.  Performance status (ECOG): 1 - Symptomatic but completely ambulatory  PHYSICAL EXAM:   Physical Exam Vitals and nursing note reviewed. Exam conducted with a chaperone present.  Constitutional:      Appearance: Normal appearance.  Cardiovascular:     Rate and Rhythm: Normal rate and regular rhythm.     Pulses: Normal pulses.     Heart sounds: Normal heart sounds.  Pulmonary:     Effort: Pulmonary effort is normal.     Breath sounds: Normal breath sounds.  Abdominal:     Palpations: Abdomen is soft. There is no hepatomegaly, splenomegaly or mass.     Tenderness: There is no abdominal tenderness.  Musculoskeletal:     Right lower leg: No edema.     Left lower leg: No edema.  Lymphadenopathy:     Cervical: No cervical adenopathy.     Right cervical: No superficial, deep or posterior cervical adenopathy.    Left cervical: No superficial, deep or posterior cervical adenopathy.     Upper Body:     Right upper body: No supraclavicular or axillary adenopathy.     Left upper body: No supraclavicular or axillary adenopathy.  Neurological:     General: No focal deficit present.     Mental Status: He is alert and oriented to person, place, and time.  Psychiatric:        Mood and Affect: Mood normal.        Behavior: Behavior normal.     LABS:      Latest Ref Rng & Units 06/28/2022    7:44 AM 06/21/2022    7:55 AM 06/14/2022    7:46  AM  CBC  WBC 4.0 - 10.5 K/uL 5.2  5.5  5.4   Hemoglobin 13.0 - 17.0 g/dL 32.4  40.1  02.7   Hematocrit 39.0 - 52.0 % 38.1  40.5  40.9   Platelets 150 - 400 K/uL 239  246  338       Latest Ref Rng & Units 06/28/2022    7:44 AM 06/21/2022    7:55 AM 06/14/2022    7:46 AM  CMP  Glucose 70 - 99 mg/dL 253  664  403   BUN 8 - 23 mg/dL 12  10  10    Creatinine 0.61 - 1.24 mg/dL 4.74  2.59  5.63   Sodium 135 - 145 mmol/L 131  132  132    Potassium 3.5 - 5.1 mmol/L 4.0  3.8  4.2   Chloride 98 - 111 mmol/L 101  100  100   CO2 22 - 32 mmol/L 22  24  22    Calcium 8.9 - 10.3 mg/dL 8.9  9.0  9.1   Total Protein 6.5 - 8.1 g/dL 6.9  6.9  7.0   Total Bilirubin 0.3 - 1.2 mg/dL 0.6  0.4  0.5   Alkaline Phos 38 - 126 U/L 52  56  58   AST 15 - 41 U/L 20  23  22    ALT 0 - 44 U/L 20  24  25       No results found for: "CEA1", "CEA" / No results found for: "CEA1", "CEA" Lab Results  Component Value Date   PSA1 4.7 (H) 04/12/2022   No results found for: "OVF643" No results found for: "CAN125"  No results found for: "TOTALPROTELP", "ALBUMINELP", "A1GS", "A2GS", "BETS", "BETA2SER", "GAMS", "MSPIKE", "SPEI" No results found for: "TIBC", "FERRITIN", "IRONPCTSAT" No results found for: "LDH"   STUDIES:   No results found.

## 2022-06-28 ENCOUNTER — Inpatient Hospital Stay: Payer: Medicare Other

## 2022-06-28 ENCOUNTER — Ambulatory Visit
Admission: RE | Admit: 2022-06-28 | Discharge: 2022-06-28 | Disposition: A | Discharge status: Home / Self Care | Payer: Medicare Other | Source: Ambulatory Visit | Attending: Radiation Oncology | Admitting: Radiation Oncology

## 2022-06-28 ENCOUNTER — Inpatient Hospital Stay (HOSPITAL_BASED_OUTPATIENT_CLINIC_OR_DEPARTMENT_OTHER): Payer: Medicare Other | Admitting: Hematology

## 2022-06-28 ENCOUNTER — Other Ambulatory Visit: Payer: Self-pay

## 2022-06-28 VITALS — BP 135/87 | HR 89 | Temp 98.3°F | Resp 18

## 2022-06-28 DIAGNOSIS — F17210 Nicotine dependence, cigarettes, uncomplicated: Secondary | ICD-10-CM | POA: Diagnosis not present

## 2022-06-28 DIAGNOSIS — Z95828 Presence of other vascular implants and grafts: Secondary | ICD-10-CM

## 2022-06-28 DIAGNOSIS — C3491 Malignant neoplasm of unspecified part of right bronchus or lung: Secondary | ICD-10-CM

## 2022-06-28 DIAGNOSIS — Z510 Encounter for antineoplastic radiation therapy: Secondary | ICD-10-CM | POA: Diagnosis not present

## 2022-06-28 DIAGNOSIS — D3502 Benign neoplasm of left adrenal gland: Secondary | ICD-10-CM | POA: Diagnosis not present

## 2022-06-28 DIAGNOSIS — Z5111 Encounter for antineoplastic chemotherapy: Secondary | ICD-10-CM | POA: Diagnosis not present

## 2022-06-28 DIAGNOSIS — Z79899 Other long term (current) drug therapy: Secondary | ICD-10-CM | POA: Diagnosis not present

## 2022-06-28 DIAGNOSIS — C3411 Malignant neoplasm of upper lobe, right bronchus or lung: Secondary | ICD-10-CM | POA: Diagnosis not present

## 2022-06-28 LAB — COMPREHENSIVE METABOLIC PANEL
ALT: 20 U/L (ref 0–44)
AST: 20 U/L (ref 15–41)
Albumin: 3.8 g/dL (ref 3.5–5.0)
Alkaline Phosphatase: 52 U/L (ref 38–126)
Anion gap: 8 (ref 5–15)
BUN: 12 mg/dL (ref 8–23)
CO2: 22 mmol/L (ref 22–32)
Calcium: 8.9 mg/dL (ref 8.9–10.3)
Chloride: 101 mmol/L (ref 98–111)
Creatinine, Ser: 0.75 mg/dL (ref 0.61–1.24)
GFR, Estimated: >60 mL/min (ref >60)
Glucose, Bld: 121 mg/dL — ABNORMAL HIGH (ref 70–99)
Potassium: 4 mmol/L (ref 3.5–5.1)
Sodium: 131 mmol/L — ABNORMAL LOW (ref 135–145)
Total Bilirubin: 0.6 mg/dL (ref 0.3–1.2)
Total Protein: 6.9 g/dL (ref 6.5–8.1)

## 2022-06-28 LAB — RAD ONC ARIA SESSION SUMMARY
Course Elapsed Days: 37
Plan Fractions Treated to Date: 27
Plan Prescribed Dose Per Fraction: 2 Gy
Plan Total Fractions Prescribed: 30
Plan Total Prescribed Dose: 60 Gy
Reference Point Dosage Given to Date: 54 Gy
Reference Point Session Dosage Given: 2 Gy
Session Number: 27

## 2022-06-28 LAB — CBC WITH DIFFERENTIAL/PLATELET
Abs Immature Granulocytes: 0.04 10*3/uL (ref 0.00–0.07)
Basophils Absolute: 0.1 10*3/uL (ref 0.0–0.1)
Basophils Relative: 1 %
Eosinophils Absolute: 0.1 10*3/uL (ref 0.0–0.5)
Eosinophils Relative: 2 %
HCT: 38.1 % — ABNORMAL LOW (ref 39.0–52.0)
Hemoglobin: 13.3 g/dL (ref 13.0–17.0)
Immature Granulocytes: 1 %
Lymphocytes Relative: 8 %
Lymphs Abs: 0.4 10*3/uL — ABNORMAL LOW (ref 0.7–4.0)
MCH: 31.7 pg (ref 26.0–34.0)
MCHC: 34.9 g/dL (ref 30.0–36.0)
MCV: 90.7 fL (ref 80.0–100.0)
Monocytes Absolute: 0.5 10*3/uL (ref 0.1–1.0)
Monocytes Relative: 10 %
Neutro Abs: 4.1 10*3/uL (ref 1.7–7.7)
Neutrophils Relative %: 78 %
Platelets: 239 10*3/uL (ref 150–400)
RBC: 4.2 MIL/uL — ABNORMAL LOW (ref 4.22–5.81)
RDW: 14.7 % (ref 11.5–15.5)
WBC: 5.2 10*3/uL (ref 4.0–10.5)
nRBC: 0 % (ref 0.0–0.2)

## 2022-06-28 LAB — MAGNESIUM: Magnesium: 1.8 mg/dL (ref 1.7–2.4)

## 2022-06-28 MED ORDER — SODIUM CHLORIDE 0.9 % IV SOLN
10.0000 mg | Freq: Once | INTRAVENOUS | Status: AC
  Administered 2022-06-28: 10 mg via INTRAVENOUS
  Filled 2022-06-28: qty 10

## 2022-06-28 MED ORDER — SODIUM CHLORIDE 0.9 % IV SOLN
220.0000 mg | Freq: Once | INTRAVENOUS | Status: AC
  Administered 2022-06-28: 220 mg via INTRAVENOUS
  Filled 2022-06-28: qty 22

## 2022-06-28 MED ORDER — CETIRIZINE HCL 10 MG/ML IV SOLN
10.0000 mg | Freq: Once | INTRAVENOUS | Status: AC
  Administered 2022-06-28: 10 mg via INTRAVENOUS
  Filled 2022-06-28: qty 1

## 2022-06-28 MED ORDER — SODIUM CHLORIDE 0.9% FLUSH
10.0000 mL | INTRAVENOUS | Status: DC | PRN
  Administered 2022-06-28: 10 mL

## 2022-06-28 MED ORDER — FAMOTIDINE IN NACL 20-0.9 MG/50ML-% IV SOLN
20.0000 mg | Freq: Once | INTRAVENOUS | Status: AC
  Administered 2022-06-28: 20 mg via INTRAVENOUS
  Filled 2022-06-28: qty 50

## 2022-06-28 MED ORDER — PALONOSETRON HCL INJECTION 0.25 MG/5ML
0.2500 mg | Freq: Once | INTRAVENOUS | Status: AC
  Administered 2022-06-28: 0.25 mg via INTRAVENOUS
  Filled 2022-06-28: qty 5

## 2022-06-28 MED ORDER — SODIUM CHLORIDE 0.9 % IV SOLN
45.0000 mg/m2 | Freq: Once | INTRAVENOUS | Status: AC
  Administered 2022-06-28: 90 mg via INTRAVENOUS
  Filled 2022-06-28: qty 15

## 2022-06-28 MED ORDER — SODIUM CHLORIDE 0.9 % IV SOLN: Freq: Once | INTRAVENOUS | Status: AC

## 2022-06-28 MED ORDER — HEPARIN SOD (PORK) LOCK FLUSH 100 UNIT/ML IV SOLN
500.0000 [IU] | Freq: Once | INTRAVENOUS | Status: AC | PRN
  Administered 2022-06-28: 500 [IU]

## 2022-06-28 NOTE — Patient Instructions (Signed)
[facility]  Discharge Instructions: Thank you for choosing [facility] to provide your oncology and hematology care.  If you have a lab appointment with the Cancer Center - please note that after April 8th, 2024, all labs will be drawn in the cancer center.  You do not have to check in or register with the main entrance as you have in the past but will complete your check-in in the cancer center.  Wear comfortable clothing and clothing appropriate for easy access to any Portacath or PICC line.   We strive to give you quality time with your provider. You may need to reschedule your appointment if you arrive late (15 or more minutes).  Arriving late affects you and other patients whose appointments are after yours.  Also, if you miss three or more appointments without notifying the office, you may be dismissed from the clinic at the provider's discretion.      For prescription refill requests, have your pharmacy contact our office and allow 72 hours for refills to be completed.    Today you received the following chemotherapy and/or immunotherapy agents taxol and carboplatin.  Carboplatin Injection What is this medication? CARBOPLATIN (KAR boe pla tin) treats some types of cancer. It works by slowing down the growth of cancer cells. This medicine may be used for other purposes; ask your health care provider or pharmacist if you have questions. COMMON BRAND NAME(S): Paraplatin What should I tell my care team before I take this medication? They need to know if you have any of these conditions: Blood disorders Hearing problems Kidney disease Recent or ongoing radiation therapy An unusual or allergic reaction to carboplatin, cisplatin, other medications, foods, dyes, or preservatives Pregnant or trying to get pregnant Breast-feeding How should I use this medication? This medication is injected into a vein. It is given by your care team in a hospital or clinic  setting. Talk to your care team about the use of this medication in children. Special care may be needed. Overdosage: If you think you have taken too much of this medicine contact a poison control center or emergency room at once. NOTE: This medicine is only for you. Do not share this medicine with others. What if I miss a dose? Keep appointments for follow-up doses. It is important not to miss your dose. Call your care team if you are unable to keep an appointment. What may interact with this medication? Medications for seizures Some antibiotics, such as amikacin, gentamicin, neomycin, streptomycin, tobramycin Vaccines This list may not describe all possible interactions. Give your health care provider a list of all the medicines, herbs, non-prescription drugs, or dietary supplements you use. Also tell them if you smoke, drink alcohol, or use illegal drugs. Some items may interact with your medicine. What should I watch for while using this medication? Your condition will be monitored carefully while you are receiving this medication. You may need blood work while taking this medication. This medication may make you feel generally unwell. This is not uncommon, as chemotherapy can affect healthy cells as well as cancer cells. Report any side effects. Continue your course of treatment even though you feel ill unless your care team tells you to stop. In some cases, you may be given additional medications to help with side effects. Follow all directions for their use. This medication may increase your risk of getting an infection. Call your care team for advice if you get a fever, chills, sore throat,  or other symptoms of a cold or flu. Do not treat yourself. Try to avoid being around people who are sick. Avoid taking medications that contain aspirin, acetaminophen, ibuprofen, naproxen, or ketoprofen unless instructed by your care team. These medications may hide a fever. Be careful brushing or  flossing your teeth or using a toothpick because you may get an infection or bleed more easily. If you have any dental work done, tell your dentist you are receiving this medication. Talk to your care team if you wish to become pregnant or think you might be pregnant. This medication can cause serious birth defects. Talk to your care team about effective forms of contraception. Do not breast-feed while taking this medication. What side effects may I notice from receiving this medication? Side effects that you should report to your care team as soon as possible: Allergic reactions--skin rash, itching, hives, swelling of the face, lips, tongue, or throat Infection--fever, chills, cough, sore throat, wounds that don't heal, pain or trouble when passing urine, general feeling of discomfort or being unwell Low red blood cell level--unusual weakness or fatigue, dizziness, headache, trouble breathing Pain, tingling, or numbness in the hands or feet, muscle weakness, change in vision, confusion or trouble speaking, loss of balance or coordination, trouble walking, seizures Unusual bruising or bleeding Side effects that usually do not require medical attention (report to your care team if they continue or are bothersome): Hair loss Nausea Unusual weakness or fatigue Vomiting This list may not describe all possible side effects. Call your doctor for medical advice about side effects. You may report side effects to FDA at 1-800-FDA-1088. Where should I keep my medication? This medication is given in a hospital or clinic. It will not be stored at home. NOTE: This sheet is a summary. It may not cover all possible information. If you have questions about this medicine, talk to your doctor, pharmacist, or health care provider.  2024 Elsevier/Gold Standard (2021-04-18 00:00:00) Paclitaxel Injection What is this medication? PACLITAXEL (PAK li TAX el) treats some types of cancer. It works by slowing down the  growth of cancer cells. This medicine may be used for other purposes; ask your health care provider or pharmacist if you have questions. COMMON BRAND NAME(S): Onxol, Taxol What should I tell my care team before I take this medication? They need to know if you have any of these conditions: Heart disease Liver disease Low white blood cell levels An unusual or allergic reaction to paclitaxel, other medications, foods, dyes, or preservatives If you or your partner are pregnant or trying to get pregnant Breast-feeding How should I use this medication? This medication is injected into a vein. It is given by your care team in a hospital or clinic setting. Talk to your care team about the use of this medication in children. While it may be given to children for selected conditions, precautions do apply. Overdosage: If you think you have taken too much of this medicine contact a poison control center or emergency room at once. NOTE: This medicine is only for you. Do not share this medicine with others. What if I miss a dose? Keep appointments for follow-up doses. It is important not to miss your dose. Call your care team if you are unable to keep an appointment. What may interact with this medication? Do not take this medication with any of the following: Live virus vaccines Other medications may affect the way this medication works. Talk with your care team about all of the  medications you take. They may suggest changes to your treatment plan to lower the risk of side effects and to make sure your medications work as intended. This list may not describe all possible interactions. Give your health care provider a list of all the medicines, herbs, non-prescription drugs, or dietary supplements you use. Also tell them if you smoke, drink alcohol, or use illegal drugs. Some items may interact with your medicine. What should I watch for while using this medication? Your condition will be monitored  carefully while you are receiving this medication. You may need blood work while taking this medication. This medication may make you feel generally unwell. This is not uncommon as chemotherapy can affect healthy cells as well as cancer cells. Report any side effects. Continue your course of treatment even though you feel ill unless your care team tells you to stop. This medication can cause serious allergic reactions. To reduce the risk, your care team may give you other medications to take before receiving this one. Be sure to follow the directions from your care team. This medication may increase your risk of getting an infection. Call your care team for advice if you get a fever, chills, sore throat, or other symptoms of a cold or flu. Do not treat yourself. Try to avoid being around people who are sick. This medication may increase your risk to bruise or bleed. Call your care team if you notice any unusual bleeding. Be careful brushing or flossing your teeth or using a toothpick because you may get an infection or bleed more easily. If you have any dental work done, tell your dentist you are receiving this medication. Talk to your care team if you may be pregnant. Serious birth defects can occur if you take this medication during pregnancy. Talk to your care team before breastfeeding. Changes to your treatment plan may be needed. What side effects may I notice from receiving this medication? Side effects that you should report to your care team as soon as possible: Allergic reactions--skin rash, itching, hives, swelling of the face, lips, tongue, or throat Heart rhythm changes--fast or irregular heartbeat, dizziness, feeling faint or lightheaded, chest pain, trouble breathing Increase in blood pressure Infection--fever, chills, cough, sore throat, wounds that don't heal, pain or trouble when passing urine, general feeling of discomfort or being unwell Low blood pressure--dizziness, feeling faint  or lightheaded, blurry vision Low red blood cell level--unusual weakness or fatigue, dizziness, headache, trouble breathing Painful swelling, warmth, or redness of the skin, blisters or sores at the infusion site Pain, tingling, or numbness in the hands or feet Slow heartbeat--dizziness, feeling faint or lightheaded, confusion, trouble breathing, unusual weakness or fatigue Unusual bruising or bleeding Side effects that usually do not require medical attention (report to your care team if they continue or are bothersome): Diarrhea Hair loss Joint pain Loss of appetite Muscle pain Nausea Vomiting This list may not describe all possible side effects. Call your doctor for medical advice about side effects. You may report side effects to FDA at 1-800-FDA-1088. Where should I keep my medication? This medication is given in a hospital or clinic. It will not be stored at home. NOTE: This sheet is a summary. It may not cover all possible information. If you have questions about this medicine, talk to your doctor, pharmacist, or health care provider.  2024 Elsevier/Gold Standard (2021-05-16 00:00:00)       To help prevent nausea and vomiting after your treatment, we encourage you to take your  nausea medication as directed.  BELOW ARE SYMPTOMS THAT SHOULD BE REPORTED IMMEDIATELY: *FEVER GREATER THAN 100.4 F (38 C) OR HIGHER *CHILLS OR SWEATING *NAUSEA AND VOMITING THAT IS NOT CONTROLLED WITH YOUR NAUSEA MEDICATION *UNUSUAL SHORTNESS OF BREATH *UNUSUAL BRUISING OR BLEEDING *URINARY PROBLEMS (pain or burning when urinating, or frequent urination) *BOWEL PROBLEMS (unusual diarrhea, constipation, pain near the anus) TENDERNESS IN MOUTH AND THROAT WITH OR WITHOUT PRESENCE OF ULCERS (sore throat, sores in mouth, or a toothache) UNUSUAL RASH, SWELLING OR PAIN  UNUSUAL VAGINAL DISCHARGE OR ITCHING   Items with * indicate a potential emergency and should be followed up as soon as possible or go to  the Emergency Department if any problems should occur.  Please show the CHEMOTHERAPY ALERT CARD or IMMUNOTHERAPY ALERT CARD at check-in to the Emergency Department and triage nurse.  Should you have questions after your visit or need to cancel or reschedule your appointment, please contact [facility] [phone]  and follow the prompts.  Office hours are 8:00 a.m. to 4:30 p.m. Monday - Friday. Please note that voicemails left after 4:00 p.m. may not be returned until the following business day.  We are closed weekends and major holidays. You have access to a nurse at all times for urgent questions. Please call the main number to the clinic [phone] and follow the prompts.  For any non-urgent questions, you may also contact your provider using MyChart. We now offer e-Visits for anyone 58 and older to request care online for non-urgent symptoms. For details visit [email or web address].   Also download the MyChart app! Go to the app store, search "MyChart", open the app, select [place], and log in with your MyChart username and password.

## 2022-06-28 NOTE — Progress Notes (Signed)
Patient has been examined by Dr. Katragadda. Vital signs and labs have been reviewed by MD - ANC, Creatinine, LFTs, hemoglobin, and platelets are within treatment parameters per M.D. - pt may proceed with treatment.  Primary RN and pharmacy notified.  

## 2022-06-28 NOTE — Progress Notes (Signed)
Patient presents today for chemotherapy infusion Taxol and Carboplatin. Patient is in satisfactory condition. Vital signs are stable.  Patient does report some shortness of breath with exertion, nausea, constipation, and slight dizziness but states it was addressed with DR Ellin Saba during visit. Labs reviewed by Dr. Ellin Saba during the office visit and all labs are within treatment parameters.  We will proceed with treatment per MD orders.   Patient tolerated treatment well with no complaints voiced.  Patient left ambulatory in stable condition.  Vital signs stable at discharge.  Follow up as scheduled.

## 2022-06-28 NOTE — Patient Instructions (Addendum)
[facility] at [facility] Discharge Instructions   You were seen and examined today by Dr. Ellin Saba.  He reviewed the results of your lab work which are normal/stable.   We will proceed with your treatment today.   Take a nausea pill before you eat to help prevent nausea.   Return as scheduled.    Thank you for choosing [facility] at [facility] to provide your oncology and hematology care.  To afford each patient quality time with our provider, please arrive at least 15 minutes before your scheduled appointment time.   If you have a lab appointment with the Cancer Center please come in thru the Main Entrance and check in at the main information desk.  You need to re-schedule your appointment should you arrive 10 or more minutes late.  We strive to give you quality time with our providers, and arriving late affects you and other patients whose appointments are after yours.  Also, if you no show three or more times for appointments you may be dismissed from the clinic at the providers discretion.     Again, thank you for choosing [facility].  Our hope is that these requests will decrease the amount of time that you wait before being seen by our physicians.       _____________________________________________________________  Should you have questions after your visit to [facility], please contact our office at [phone] and follow the prompts.  Our office hours are 8:00 a.m. and 4:30 p.m. Monday - Friday.  Please note that voicemails left after 4:00 p.m. may not be returned until the following business day.  We are closed weekends and major holidays.  You do have access to a nurse 24-7, just call the main number to the clinic [phone] and do not press any options, hold on the line and a nurse will answer the phone.    For prescription refill requests, have your pharmacy contact our office and allow 72 hours.     Due to Covid, you will need to wear a mask upon entering the hospital. If you do not have a mask, a mask will be given to you at the Main Entrance upon arrival. For doctor visits, patients may have 1 support person age 12 or older with them. For treatment visits, patients can not have anyone with them due to social distancing guidelines and our immunocompromised population.

## 2022-06-29 ENCOUNTER — Ambulatory Visit
Admission: RE | Admit: 2022-06-29 | Discharge: 2022-06-29 | Disposition: A | Discharge status: Home / Self Care | Payer: Medicare Other | Source: Ambulatory Visit | Attending: Radiation Oncology | Admitting: Radiation Oncology

## 2022-06-29 ENCOUNTER — Other Ambulatory Visit: Payer: Self-pay

## 2022-06-29 DIAGNOSIS — C3491 Malignant neoplasm of unspecified part of right bronchus or lung: Secondary | ICD-10-CM

## 2022-06-29 DIAGNOSIS — F17210 Nicotine dependence, cigarettes, uncomplicated: Secondary | ICD-10-CM | POA: Diagnosis not present

## 2022-06-29 DIAGNOSIS — Z510 Encounter for antineoplastic radiation therapy: Secondary | ICD-10-CM | POA: Diagnosis not present

## 2022-06-29 DIAGNOSIS — Z79899 Other long term (current) drug therapy: Secondary | ICD-10-CM | POA: Diagnosis not present

## 2022-06-29 DIAGNOSIS — D3502 Benign neoplasm of left adrenal gland: Secondary | ICD-10-CM | POA: Diagnosis not present

## 2022-06-29 DIAGNOSIS — C3411 Malignant neoplasm of upper lobe, right bronchus or lung: Secondary | ICD-10-CM | POA: Diagnosis not present

## 2022-06-29 DIAGNOSIS — Z5111 Encounter for antineoplastic chemotherapy: Secondary | ICD-10-CM | POA: Diagnosis not present

## 2022-06-29 LAB — RAD ONC ARIA SESSION SUMMARY
Course Elapsed Days: 38
Plan Fractions Treated to Date: 28
Plan Prescribed Dose Per Fraction: 2 Gy
Plan Total Fractions Prescribed: 30
Plan Total Prescribed Dose: 60 Gy
Reference Point Dosage Given to Date: 56 Gy
Reference Point Session Dosage Given: 2 Gy
Session Number: 28

## 2022-06-29 MED ORDER — SONAFINE EX EMUL
1.0000 | Freq: Two times a day (BID) | CUTANEOUS | Status: DC
  Administered 2022-06-29: 1 via TOPICAL

## 2022-07-02 ENCOUNTER — Other Ambulatory Visit: Payer: Self-pay

## 2022-07-02 ENCOUNTER — Ambulatory Visit
Admission: RE | Admit: 2022-07-02 | Discharge: 2022-07-02 | Disposition: A | Discharge status: Home / Self Care | Payer: Medicare Other | Source: Ambulatory Visit | Attending: Radiation Oncology | Admitting: Radiation Oncology

## 2022-07-02 DIAGNOSIS — Z5111 Encounter for antineoplastic chemotherapy: Secondary | ICD-10-CM | POA: Diagnosis not present

## 2022-07-02 DIAGNOSIS — F17210 Nicotine dependence, cigarettes, uncomplicated: Secondary | ICD-10-CM | POA: Diagnosis not present

## 2022-07-02 DIAGNOSIS — D3502 Benign neoplasm of left adrenal gland: Secondary | ICD-10-CM | POA: Diagnosis not present

## 2022-07-02 DIAGNOSIS — C3411 Malignant neoplasm of upper lobe, right bronchus or lung: Secondary | ICD-10-CM | POA: Diagnosis not present

## 2022-07-02 DIAGNOSIS — Z510 Encounter for antineoplastic radiation therapy: Secondary | ICD-10-CM | POA: Diagnosis not present

## 2022-07-02 DIAGNOSIS — Z79899 Other long term (current) drug therapy: Secondary | ICD-10-CM | POA: Diagnosis not present

## 2022-07-02 LAB — RAD ONC ARIA SESSION SUMMARY
Course Elapsed Days: 41
Plan Fractions Treated to Date: 29
Plan Prescribed Dose Per Fraction: 2 Gy
Plan Total Fractions Prescribed: 30
Plan Total Prescribed Dose: 60 Gy
Reference Point Dosage Given to Date: 58 Gy
Reference Point Session Dosage Given: 2 Gy
Session Number: 29

## 2022-07-03 ENCOUNTER — Other Ambulatory Visit: Payer: Self-pay

## 2022-07-03 ENCOUNTER — Ambulatory Visit
Admission: RE | Admit: 2022-07-03 | Discharge: 2022-07-03 | Disposition: A | Discharge status: Home / Self Care | Payer: Medicare Other | Source: Ambulatory Visit | Attending: Radiation Oncology | Admitting: Radiation Oncology

## 2022-07-03 DIAGNOSIS — C3411 Malignant neoplasm of upper lobe, right bronchus or lung: Secondary | ICD-10-CM | POA: Diagnosis not present

## 2022-07-03 DIAGNOSIS — F17210 Nicotine dependence, cigarettes, uncomplicated: Secondary | ICD-10-CM | POA: Diagnosis not present

## 2022-07-03 DIAGNOSIS — Z510 Encounter for antineoplastic radiation therapy: Secondary | ICD-10-CM | POA: Diagnosis not present

## 2022-07-03 DIAGNOSIS — Z5111 Encounter for antineoplastic chemotherapy: Secondary | ICD-10-CM | POA: Diagnosis not present

## 2022-07-03 DIAGNOSIS — Z79899 Other long term (current) drug therapy: Secondary | ICD-10-CM | POA: Diagnosis not present

## 2022-07-03 DIAGNOSIS — D3502 Benign neoplasm of left adrenal gland: Secondary | ICD-10-CM | POA: Diagnosis not present

## 2022-07-03 LAB — RAD ONC ARIA SESSION SUMMARY
Course Elapsed Days: 42
Plan Fractions Treated to Date: 30
Plan Prescribed Dose Per Fraction: 2 Gy
Plan Total Fractions Prescribed: 30
Plan Total Prescribed Dose: 60 Gy
Reference Point Dosage Given to Date: 60 Gy
Reference Point Session Dosage Given: 2 Gy
Session Number: 30

## 2022-07-04 ENCOUNTER — Ambulatory Visit
Admission: RE | Admit: 2022-07-04 | Discharge: 2022-07-04 | Disposition: A | Discharge status: Home / Self Care | Payer: Medicare Other | Source: Ambulatory Visit | Attending: Radiation Oncology | Admitting: Radiation Oncology

## 2022-07-04 ENCOUNTER — Other Ambulatory Visit: Payer: Self-pay

## 2022-07-04 DIAGNOSIS — D3502 Benign neoplasm of left adrenal gland: Secondary | ICD-10-CM | POA: Diagnosis not present

## 2022-07-04 DIAGNOSIS — Z510 Encounter for antineoplastic radiation therapy: Secondary | ICD-10-CM | POA: Diagnosis not present

## 2022-07-04 DIAGNOSIS — Z79899 Other long term (current) drug therapy: Secondary | ICD-10-CM | POA: Diagnosis not present

## 2022-07-04 DIAGNOSIS — F17210 Nicotine dependence, cigarettes, uncomplicated: Secondary | ICD-10-CM | POA: Diagnosis not present

## 2022-07-04 DIAGNOSIS — C3411 Malignant neoplasm of upper lobe, right bronchus or lung: Secondary | ICD-10-CM | POA: Diagnosis not present

## 2022-07-04 DIAGNOSIS — Z5111 Encounter for antineoplastic chemotherapy: Secondary | ICD-10-CM | POA: Diagnosis not present

## 2022-07-04 LAB — RAD ONC ARIA SESSION SUMMARY
Course Elapsed Days: 43
Plan Fractions Treated to Date: 1
Plan Prescribed Dose Per Fraction: 2 Gy
Plan Total Fractions Prescribed: 3
Plan Total Prescribed Dose: 6 Gy
Reference Point Dosage Given to Date: 2 Gy
Reference Point Session Dosage Given: 2 Gy
Session Number: 31

## 2022-07-04 NOTE — Progress Notes (Signed)
[facility] [address], [place] [address]    Clinic Day:  07/05/2022  Referring physician: Benita Stabile, MD  Patient Care Team: Benita Stabile, MD as PCP - General (Internal Medicine) Carlos Massed, MD as Medical Oncologist (Medical Oncology) Therese Sarah, RN as Oncology Nurse Navigator (Medical Oncology)   ASSESSMENT & PLAN:   Assessment: 1.  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  No hemoptysis/weight loss. - Chest x-ray (04/10/2022): Mid right lung mass. - CT chest (04/13/2022): 9.2 x 5.7 x 6.6 cm right upper lobe mass with lobulations appearing to extend across the minor fissure into the right middle lobe and right lower lobe.  Satellite lesion in the right upper lobe measures 1.6 x 1.4 x 1.4 cm.  No findings of chest wall invasion.  Borderline prominent lower right paratracheal lymph node 0.9 cm.  Old granulomatous disease.  Small left adrenal adenoma. - Has right posterior chest wall pain for the past 6 months, dull type.  He is not requiring any pain medication. - Brain MRI (04/19/2022): No metastatic disease. - PET scan (04/19/2022): 9 x 5.7 cm right upper lobe mass with SUV 11.8.  1.3 x 0.9 cm satellite nodule in the right upper lobe with SUV 3.8.  No adenopathy or distant metastatic disease. - Bronchoscopy and biopsy by Carlos [name] - Pathology (04/23/2022): Both right upper lobe lung nodule FNA consistent with poorly differentiated carcinoma.  IHC diffusely positive for TTF-1, Napsin, synaptophysin and CK7.  CD56 negative.  P63 and CK5/6 negative.  Given Napsin positivity, favor adenocarcinoma.  There is also diffuse synaptophysin positivity consistent with neuroendocrine differentiation.  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 - Discussed with Carlos [name].  Not a surgical candidate as he requires pneumonectomy and has  suboptimal DLCO. - Chemoradiation with carboplatin and paclitaxel from 05/22/2022 through 07/06/2022   2.  Social/family history: - He is seen with his 2 sisters today.  Lives by himself and is independent of ADLs and IADLs.  He retired after working at [employer] for the last 34 years.  No asbestos exposure.  No other chemicals.  Smokes more than 1 pack/day for the last 50 years (started at age 39) quit smoking for 5 years before starting back again. - Brother died of lung cancer.  Father had bladder cancer.  Maternal uncle had cancer.    Plan: 1.  Stage IIIa (T4 N0 M0) adenocarcinoma of the right lung: - He has some cough and coughed up blood 1 time yesterday.  However he had a nosebleed prior to that. - Denies any GI side effects or tingling or numbness in extremities. - Labs today: Normal LFTs and creatinine.  CBC was grossly normal. - He will proceed with his last treatment today.  He will finish XRT on 07/06/2022. - We talked about scheduling him for CT of the chest in 2 weeks.  If he has at least partial response, we will consider consolidation with immunotherapy with durvalumab.  We discussed side effects.  He is agreeable.  RTC 3 weeks for follow-up with first dose Durvalumab.   2.  Anxiety/depression: - Continue clonazepam 1 g twice daily.  This is well-controlled.   3.  COPD: - He has cough with expectoration in the morning.  Use albuterol inhaler as needed.    Orders Placed This Encounter  Procedures  CT Chest W Contrast    Standing Status:   Future    Standing Expiration Date:   07/05/2023    Order Specific Question:   If indicated for the ordered procedure, I authorize the administration of contrast media per Radiology protocol    Answer:   Yes    Order Specific Question:   Does the patient have a contrast media/X-ray dye allergy?    Answer:   No    Order Specific Question:   Preferred imaging location?    Answer:   [facility]      I,Katie  Daubenspeck,acting as a scribe for Carlos Massed, MD.,have documented all relevant documentation on the behalf of Carlos Massed, MD,as directed by  Carlos Massed, MD while in the presence of Carlos Massed, MD.   I, Carlos Massed MD, have reviewed the above documentation for accuracy and completeness, and I agree with the above.   Carlos Massed, MD   6/27/202412:33 PM  CHIEF COMPLAINT:   Diagnosis: RUL lung adenocarcinoma    Cancer Staging  Non-small cell cancer of right lung [facility]) Staging form: Lung, AJCC 8th Edition - Clinical stage from 05/01/2022: Stage IIIA (cT4, cN0, cM0) - Unsigned    Prior Therapy: none  Current Therapy:  concurrent chemoradiation with carboplatin + paclitaxel    HISTORY OF PRESENT ILLNESS:   Oncology History  Non-small cell cancer of right lung (HCC)  05/01/2022 Initial Diagnosis   Non-small cell cancer of right lung (HCC)   05/22/2022 -  Chemotherapy   Patient is on Treatment Plan : LUNG Carboplatin + Paclitaxel + XRT q7d        INTERVAL HISTORY:   [name] is a 67 y.o. male presenting to clinic today for follow up of RUL lung adenocarcinoma. He was last seen by me on 06/28/22.  Today, he states that he is doing well overall. His appetite level is at 100%. His energy level is at 100%.  PAST MEDICAL HISTORY:   Past Medical History: Past Medical History:  Diagnosis Date   Anxiety    Cancer (HCC)    Skin cancer left shoulder   Chronic low back pain    Complication of anesthesia    Dermatofibrosarcoma protubera of right shoulder    Dyspnea    GERD (gastroesophageal reflux disease)    Hyperlipidemia    Lung cancer (HCC) 04/23/2022   PONV (postoperative nausea and vomiting)    Port-A-Cath in place 05/17/2022   Prostate cancer [facility])     Surgical History: Past Surgical History:  Procedure Laterality Date   APPENDECTOMY     APPLICATION OF A-CELL OF EXTREMITY Right 07/09/2012   Procedure: PLACEMENT OF  A-CELL TO UPPER EXTREMITY/PLACEMENT OF VAC;  Surgeon: Wayland Denis, DO;  Location: [facility];  Service: Plastics;  Laterality: Right;   BRONCHIAL BRUSHINGS  04/23/2022   Procedure: BRONCHIAL BRUSHINGS;  Surgeon: Leslye Peer, MD;  Location: [facility] ENDOSCOPY;  Service: Pulmonary;;   BRONCHIAL NEEDLE ASPIRATION BIOPSY  04/23/2022   Procedure: BRONCHIAL NEEDLE ASPIRATION BIOPSIES;  Surgeon: Leslye Peer, MD;  Location: [facility] ENDOSCOPY;  Service: Pulmonary;;   COLONOSCOPY WITH PROPOFOL N/A 09/30/2015   Procedure: COLONOSCOPY WITH PROPOFOL;  Surgeon: Malissa Hippo, MD;  Location: [facility]O [facility];  Service: Endoscopy;  Laterality: N/A;  830   EXCISION DERMATOFIBROCARCOMA PROTUBERAN RIGHT SHOULDER  2 - 3 WKS AGO   HEMOSTASIS CONTROL  04/23/2022   Procedure: HEMOSTASIS CONTROL;  Surgeon: Leslye Peer, MD;  Location: [facility] ENDOSCOPY;  Service:  Pulmonary;;   INGUINAL HERNIA REPAIR Left 1999   IR IMAGING GUIDED PORT INSERTION  04/30/2022   KNEE ARTHROSCOPY Left 2012   POLYPECTOMY  09/30/2015   Procedure: POLYPECTOMY;  Surgeon: Malissa Hippo, MD;  Location: [facility];  Service: Endoscopy;;  sigmoid   VIDEO BRONCHOSCOPY WITH ENDOBRONCHIAL ULTRASOUND Bilateral 04/23/2022   Procedure: VIDEO BRONCHOSCOPY WITH ENDOBRONCHIAL ULTRASOUND;  Surgeon: Leslye Peer, MD;  Location: [facility] ENDOSCOPY;  Service: Pulmonary;  Laterality: Bilateral;   VIDEO BRONCHOSCOPY WITH RADIAL ENDOBRONCHIAL ULTRASOUND  04/23/2022   Procedure: VIDEO BRONCHOSCOPY WITH RADIAL ENDOBRONCHIAL ULTRASOUND;  Surgeon: Leslye Peer, MD;  Location: [facility] ENDOSCOPY;  Service: Pulmonary;;    Social History: Social History   Socioeconomic History   Marital status: Divorced    Spouse name: Burna Mortimer   Number of children: 1   Years of education: 10th   Highest education level: Not on file  Occupational History    Employer: [employer]    Comment: [employer]  Tobacco Use   Smoking status: Every Day    Packs/day:  1.00    Years: 40.00    Additional pack years: 0.00    Total pack years: 40.00    Types: Cigarettes   Smokeless tobacco: Never  Vaping Use   Vaping Use: Never used  Substance and Sexual Activity   Alcohol use: Yes    Comment: 2-3 cans beer/day   Drug use: No   Sexual activity: Not on file  Other Topics Concern   Not on file  Social History Narrative   Patient lives at home with his dog. 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 Patient has 10 grade education.Patient is right-handed.Caffeine Use: 1 cup of coffee; 3-4 sodas daily   Social Determinants of Health   Financial Resource Strain: High Risk (05/10/2022)   Overall Financial Resource Strain (CARDIA)    Difficulty of Paying Living Expenses: Very hard  Food Insecurity: Food Insecurity Present (05/04/2022)   Hunger Vital Sign    Worried About Running Out of Food in the Last Year: Sometimes true    Ran Out of Food in the Last Year: Sometimes true  Transportation Needs: No Transportation Needs (05/04/2022)   PRAPARE - Administrator, Civil Service (Medical): No    Lack of Transportation (Non-Medical): No  Physical Activity: Not on file  Stress: Not on file  Social Connections: Not on file  Intimate Partner Violence: Not At Risk (05/04/2022)   Humiliation, Afraid, Rape, and Kick questionnaire    Fear of Current or Ex-Partner: No    Emotionally Abused: No    Physically Abused: No    Sexually Abused: No    Family History: Family History  Problem Relation Age of Onset   Dementia Mother    Cancer Father     Current Medications:  Current Outpatient Medications:    acetaminophen (TYLENOL) 500 MG tablet, Take 1,000 mg by mouth every 6 (six) hours as needed (pain.)., Disp: , Rfl:    albuterol (VENTOLIN HFA) 108 (90 Base) MCG/ACT inhaler, Inhale 2 puffs into the lungs every 6 (six) hours as needed for wheezing or shortness of breath., Disp: 8 g, Rfl: 6   aspirin EC 81 MG tablet, Take 81  mg by mouth at bedtime. Swallow whole., Disp: , Rfl:    CARBOPLATIN IV, Inject into the vein once a week., Disp: , Rfl:    clonazePAM (KLONOPIN) 1 MG disintegrating tablet, Take one tablet by mouth every morning  and two tablets by mouth at bedtime, Disp: 90 tablet, Rfl: 2   lidocaine-prilocaine (EMLA) cream, Apply a quarter-sized amount to port a cath site and cover with plastic wrap 1 hour prior to infusion appointments, Disp: 30 g, Rfl: 3   meloxicam (MOBIC) 15 MG tablet, Take 15 mg by mouth in the morning., Disp: , Rfl:    PACLitaxel (TAXOL IV), Inject into the vein once a week., Disp: , Rfl:    pantoprazole (PROTONIX) 40 MG tablet, Take 40 mg by mouth daily., Disp: , Rfl:    prochlorperazine (COMPAZINE) 10 MG tablet, Take 1 tablet (10 mg total) by mouth every 6 (six) hours as needed for nausea or vomiting., Disp: 60 tablet, Rfl: 3   simvastatin (ZOCOR) 20 MG tablet, Take 20 mg by mouth at bedtime., Disp: , Rfl: 2   sucralfate (CARAFATE) 1 g tablet, Take 1 tablet (1 g total) by mouth 4 (four) times daily. Dissolve each tablet in 15 cc water before use., Disp: 120 tablet, Rfl: 2   tamsulosin (FLOMAX) 0.4 MG CAPS capsule, Take 1 capsule (0.4 mg total) by mouth daily. (Patient taking differently: Take 0.4 mg by mouth at bedtime.), Disp: 30 capsule, Rfl: 11 No current facility-administered medications for this visit.  Facility-Administered Medications Ordered in Other Visits:    CARBOplatin (PARAPLATIN) 220 mg in sodium chloride 0.9 % 250 mL chemo infusion, 220 mg, Intravenous, Once, Carlos Massed, MD   heparin lock flush 100 unit/mL, 500 Units, Intracatheter, Once PRN, Carlos Massed, MD   PACLitaxel (TAXOL) 90 mg in sodium chloride 0.9 % 250 mL chemo infusion (</= 80mg /m2), 45 mg/m2 (Treatment Plan Recorded), Intravenous, Once, Carlos Massed, MD, Last Rate: 265 mL/hr at 07/05/22 1220, 90 mg at 07/05/22 1220   sodium chloride flush (NS) 0.9 % injection 10 mL, 10 mL,  Intracatheter, PRN, Carlos Massed, MD   Allergies: Allergies  Allergen Reactions   Ciprofloxacin Swelling and Other (See Comments)    Swelling in hands, numbness in arms and joint pain   Gabapentin Other (See Comments)    Double vision    REVIEW OF SYSTEMS:   Review of Systems  Constitutional:  Negative for chills, fatigue and fever.  HENT:   Negative for lump/mass, mouth sores, nosebleeds, sore throat and trouble swallowing.   Eyes:  Negative for eye problems.  Respiratory:  Positive for cough and shortness of breath.   Cardiovascular:  Negative for chest pain, leg swelling and palpitations.  Gastrointestinal:  Negative for abdominal pain, constipation, diarrhea, nausea and vomiting.  Genitourinary:  Negative for bladder incontinence, difficulty urinating, dysuria, frequency, hematuria and nocturia.   Musculoskeletal:  Negative for arthralgias, back pain, flank pain, myalgias and neck pain.  Skin:  Negative for itching and rash.  Neurological:  Positive for dizziness and headaches. Negative for numbness.  Hematological:  Does not bruise/bleed easily.  Psychiatric/Behavioral:  Negative for depression, sleep disturbance and suicidal ideas. The patient is not nervous/anxious.   All other systems reviewed and are negative.    VITALS:   There were no vitals taken for this visit.  Wt Readings from Last 3 Encounters:  07/05/22 181 lb 9.6 oz (82.4 kg)  06/28/22 183 lb 9.6 oz (83.3 kg)  06/21/22 183 lb 9.6 oz (83.3 kg)    There is no height or weight on file to calculate BMI.  Performance status (ECOG): 1 - Symptomatic but completely ambulatory  PHYSICAL EXAM:   Physical Exam Vitals and nursing note reviewed. Exam conducted with a chaperone present.  Constitutional:      Appearance: Normal appearance.  Cardiovascular:     Rate and Rhythm: Normal rate and regular rhythm.     Pulses: Normal pulses.     Heart sounds: Normal heart sounds.  Pulmonary:     Effort:  Pulmonary effort is normal.     Breath sounds: Normal breath sounds.  Abdominal:     Palpations: Abdomen is soft. There is no hepatomegaly, splenomegaly or mass.     Tenderness: There is no abdominal tenderness.  Musculoskeletal:     Right lower leg: No edema.     Left lower leg: No edema.  Lymphadenopathy:     Cervical: No cervical adenopathy.     Right cervical: No superficial, deep or posterior cervical adenopathy.    Left cervical: No superficial, deep or posterior cervical adenopathy.     Upper Body:     Right upper body: No supraclavicular or axillary adenopathy.     Left upper body: No supraclavicular or axillary adenopathy.  Neurological:     General: No focal deficit present.     Mental Status: He is alert and oriented to person, place, and time.  Psychiatric:        Mood and Affect: Mood normal.        Behavior: Behavior normal.     LABS:      Latest Ref Rng & Units 07/05/2022    9:49 AM 06/28/2022    7:44 AM 06/21/2022    7:55 AM  CBC  WBC 4.0 - 10.5 K/uL 4.5  5.2  5.5   Hemoglobin 13.0 - 17.0 g/dL 40.9  81.1  91.4   Hematocrit 39.0 - 52.0 % 38.5  38.1  40.5   Platelets 150 - 400 K/uL 279  239  246       Latest Ref Rng & Units 07/05/2022    9:49 AM 06/28/2022    7:44 AM 06/21/2022    7:55 AM  CMP  Glucose 70 - 99 mg/dL 782  956  213   BUN 8 - 23 mg/dL 10  12  10    Creatinine 0.61 - 1.24 mg/dL 0.86  5.78  4.69   Sodium 135 - 145 mmol/L 132  131  132   Potassium 3.5 - 5.1 mmol/L 3.5  4.0  3.8   Chloride 98 - 111 mmol/L 101  101  100   CO2 22 - 32 mmol/L 22  22  24    Calcium 8.9 - 10.3 mg/dL 9.0  8.9  9.0   Total Protein 6.5 - 8.1 g/dL 7.2  6.9  6.9   Total Bilirubin 0.3 - 1.2 mg/dL 0.6  0.6  0.4   Alkaline Phos 38 - 126 U/L 53  52  56   AST 15 - 41 U/L 20  20  23    ALT 0 - 44 U/L 23  20  24       No results found for: "CEA1", "CEA" / No results found for: "CEA1", "CEA" Lab Results  Component Value Date   PSA1 4.7 (H) 04/12/2022   No results found for:  "GEX528" No results found for: "CAN125"  No results found for: "TOTALPROTELP", "ALBUMINELP", "A1GS", "A2GS", "BETS", "BETA2SER", "GAMS", "MSPIKE", "SPEI" No results found for: "TIBC", "FERRITIN", "IRONPCTSAT" No results found for: "LDH"   STUDIES:   No results found.

## 2022-07-05 ENCOUNTER — Other Ambulatory Visit: Payer: Self-pay

## 2022-07-05 ENCOUNTER — Inpatient Hospital Stay: Payer: Medicare Other

## 2022-07-05 ENCOUNTER — Ambulatory Visit
Admission: RE | Admit: 2022-07-05 | Discharge: 2022-07-05 | Disposition: A | Discharge status: Home / Self Care | Payer: Medicare Other | Source: Ambulatory Visit | Attending: Radiation Oncology | Admitting: Radiation Oncology

## 2022-07-05 ENCOUNTER — Inpatient Hospital Stay (HOSPITAL_BASED_OUTPATIENT_CLINIC_OR_DEPARTMENT_OTHER): Payer: Medicare Other | Admitting: Hematology

## 2022-07-05 VITALS — BP 139/80 | HR 90 | Temp 97.6°F | Resp 18

## 2022-07-05 DIAGNOSIS — C3491 Malignant neoplasm of unspecified part of right bronchus or lung: Secondary | ICD-10-CM

## 2022-07-05 DIAGNOSIS — F17210 Nicotine dependence, cigarettes, uncomplicated: Secondary | ICD-10-CM | POA: Diagnosis not present

## 2022-07-05 DIAGNOSIS — Z95828 Presence of other vascular implants and grafts: Secondary | ICD-10-CM

## 2022-07-05 DIAGNOSIS — Z79899 Other long term (current) drug therapy: Secondary | ICD-10-CM | POA: Diagnosis not present

## 2022-07-05 DIAGNOSIS — C3411 Malignant neoplasm of upper lobe, right bronchus or lung: Secondary | ICD-10-CM | POA: Diagnosis not present

## 2022-07-05 DIAGNOSIS — Z5111 Encounter for antineoplastic chemotherapy: Secondary | ICD-10-CM | POA: Diagnosis not present

## 2022-07-05 DIAGNOSIS — D3502 Benign neoplasm of left adrenal gland: Secondary | ICD-10-CM | POA: Diagnosis not present

## 2022-07-05 LAB — CBC WITH DIFFERENTIAL/PLATELET
Abs Immature Granulocytes: 0.03 10*3/uL (ref 0.00–0.07)
Basophils Absolute: 0 10*3/uL (ref 0.0–0.1)
Basophils Relative: 1 %
Eosinophils Absolute: 0.1 10*3/uL (ref 0.0–0.5)
Eosinophils Relative: 2 %
HCT: 38.5 % — ABNORMAL LOW (ref 39.0–52.0)
Hemoglobin: 13.4 g/dL (ref 13.0–17.0)
Immature Granulocytes: 1 %
Lymphocytes Relative: 8 %
Lymphs Abs: 0.4 10*3/uL — ABNORMAL LOW (ref 0.7–4.0)
MCH: 31.7 pg (ref 26.0–34.0)
MCHC: 34.8 g/dL (ref 30.0–36.0)
MCV: 91 fL (ref 80.0–100.0)
Monocytes Absolute: 0.4 10*3/uL (ref 0.1–1.0)
Monocytes Relative: 8 %
Neutro Abs: 3.6 10*3/uL (ref 1.7–7.7)
Neutrophils Relative %: 80 %
Platelets: 279 10*3/uL (ref 150–400)
RBC: 4.23 MIL/uL (ref 4.22–5.81)
RDW: 14.5 % (ref 11.5–15.5)
WBC: 4.5 10*3/uL (ref 4.0–10.5)
nRBC: 0 % (ref 0.0–0.2)

## 2022-07-05 LAB — MAGNESIUM: Magnesium: 1.8 mg/dL (ref 1.7–2.4)

## 2022-07-05 LAB — RAD ONC ARIA SESSION SUMMARY
Course Elapsed Days: 44
Plan Fractions Treated to Date: 2
Plan Prescribed Dose Per Fraction: 2 Gy
Plan Total Fractions Prescribed: 3
Plan Total Prescribed Dose: 6 Gy
Reference Point Dosage Given to Date: 4 Gy
Reference Point Session Dosage Given: 2 Gy
Session Number: 32

## 2022-07-05 LAB — COMPREHENSIVE METABOLIC PANEL
ALT: 23 U/L (ref 0–44)
AST: 20 U/L (ref 15–41)
Albumin: 4.1 g/dL (ref 3.5–5.0)
Alkaline Phosphatase: 53 U/L (ref 38–126)
Anion gap: 9 (ref 5–15)
BUN: 10 mg/dL (ref 8–23)
CO2: 22 mmol/L (ref 22–32)
Calcium: 9 mg/dL (ref 8.9–10.3)
Chloride: 101 mmol/L (ref 98–111)
Creatinine, Ser: 0.73 mg/dL (ref 0.61–1.24)
GFR, Estimated: >60 mL/min (ref >60)
Glucose, Bld: 153 mg/dL — ABNORMAL HIGH (ref 70–99)
Potassium: 3.5 mmol/L (ref 3.5–5.1)
Sodium: 132 mmol/L — ABNORMAL LOW (ref 135–145)
Total Bilirubin: 0.6 mg/dL (ref 0.3–1.2)
Total Protein: 7.2 g/dL (ref 6.5–8.1)

## 2022-07-05 MED ORDER — SODIUM CHLORIDE 0.9 % IV SOLN
45.0000 mg/m2 | Freq: Once | INTRAVENOUS | Status: AC
  Administered 2022-07-05: 90 mg via INTRAVENOUS
  Filled 2022-07-05: qty 15

## 2022-07-05 MED ORDER — SODIUM CHLORIDE 0.9 % IV SOLN
220.0000 mg | Freq: Once | INTRAVENOUS | Status: AC
  Administered 2022-07-05: 220 mg via INTRAVENOUS
  Filled 2022-07-05: qty 22

## 2022-07-05 MED ORDER — SODIUM CHLORIDE 0.9% FLUSH
10.0000 mL | Freq: Once | INTRAVENOUS | Status: AC
  Administered 2022-07-05: 10 mL via INTRAVENOUS

## 2022-07-05 MED ORDER — FAMOTIDINE IN NACL 20-0.9 MG/50ML-% IV SOLN
20.0000 mg | Freq: Once | INTRAVENOUS | Status: AC
  Administered 2022-07-05: 20 mg via INTRAVENOUS
  Filled 2022-07-05: qty 50

## 2022-07-05 MED ORDER — HEPARIN SOD (PORK) LOCK FLUSH 100 UNIT/ML IV SOLN
500.0000 [IU] | Freq: Once | INTRAVENOUS | Status: AC | PRN
  Administered 2022-07-05: 500 [IU]

## 2022-07-05 MED ORDER — SODIUM CHLORIDE 0.9% FLUSH
10.0000 mL | INTRAVENOUS | Status: DC | PRN
  Administered 2022-07-05: 10 mL

## 2022-07-05 MED ORDER — SODIUM CHLORIDE 0.9 % IV SOLN
10.0000 mg | Freq: Once | INTRAVENOUS | Status: AC
  Administered 2022-07-05: 10 mg via INTRAVENOUS
  Filled 2022-07-05: qty 10

## 2022-07-05 MED ORDER — PALONOSETRON HCL INJECTION 0.25 MG/5ML
0.2500 mg | Freq: Once | INTRAVENOUS | Status: AC
  Administered 2022-07-05: 0.25 mg via INTRAVENOUS
  Filled 2022-07-05: qty 5

## 2022-07-05 MED ORDER — CETIRIZINE HCL 10 MG/ML IV SOLN
10.0000 mg | Freq: Once | INTRAVENOUS | Status: AC
  Administered 2022-07-05: 10 mg via INTRAVENOUS
  Filled 2022-07-05: qty 1

## 2022-07-05 MED ORDER — SODIUM CHLORIDE 0.9 % IV SOLN: Freq: Once | INTRAVENOUS | Status: AC

## 2022-07-05 NOTE — Progress Notes (Signed)
Patient has been examined by Dr. Katragadda. Vital signs and labs have been reviewed by MD - ANC, Creatinine, LFTs, hemoglobin, and platelets are within treatment parameters per M.D. - pt may proceed with treatment.  Primary RN and pharmacy notified.  

## 2022-07-05 NOTE — Patient Instructions (Signed)
[facility] at [facility] Discharge Instructions   You were seen and examined today by Dr. Katragadda.  He reviewed the results of your lab work which are normal/stable.   We will proceed with your treatment today.  Return as scheduled.    Thank you for choosing [facility] at [facility] to provide your oncology and hematology care.  To afford each patient quality time with our provider, please arrive at least 15 minutes before your scheduled appointment time.   If you have a lab appointment with the Cancer Center please come in thru the Main Entrance and check in at the main information desk.  You need to re-schedule your appointment should you arrive 10 or more minutes late.  We strive to give you quality time with our providers, and arriving late affects you and other patients whose appointments are after yours.  Also, if you no show three or more times for appointments you may be dismissed from the clinic at the providers discretion.     Again, thank you for choosing [facility].  Our hope is that these requests will decrease the amount of time that you wait before being seen by our physicians.       _____________________________________________________________  Should you have questions after your visit to [facility], please contact our office at [phone] and follow the prompts.  Our office hours are 8:00 a.m. and 4:30 p.m. Monday - Friday.  Please note that voicemails left after 4:00 p.m. may not be returned until the following business day.  We are closed weekends and major holidays.  You do have access to a nurse 24-7, just call the main number to the clinic [phone] and do not press any options, hold on the line and a nurse will answer the phone.    For prescription refill requests, have your pharmacy contact our office and allow 72 hours.    Due to Covid, you will need to wear a mask upon entering  the hospital. If you do not have a mask, a mask will be given to you at the Main Entrance upon arrival. For doctor visits, patients may have 1 support person age 18 or older with them. For treatment visits, patients can not have anyone with them due to social distancing guidelines and our immunocompromised population.      

## 2022-07-05 NOTE — Progress Notes (Signed)
Patient presents today for chemotherapy infusion. Patient is in satisfactory condition with no new complaints voiced.  Vital signs are stable.  Labs reviewed by Dr. Katragadda during the office visit and all labs are within treatment parameters.  We will proceed with treatment per MD orders.   Patient tolerated treatment well with no complaints voiced.  Patient left ambulatory with wife in stable condition.  Vital signs stable at discharge.  Follow up as scheduled.    

## 2022-07-05 NOTE — Patient Instructions (Signed)
[facility]  Discharge Instructions: Thank you for choosing [facility] to provide your oncology and hematology care.  If you have a lab appointment with the Cancer Center - please note that after April 8th, 2024, all labs will be drawn in the cancer center.  You do not have to check in or register with the main entrance as you have in the past but will complete your check-in in the cancer center.  Wear comfortable clothing and clothing appropriate for easy access to any Portacath or PICC line.   We strive to give you quality time with your provider. You may need to reschedule your appointment if you arrive late (15 or more minutes).  Arriving late affects you and other patients whose appointments are after yours.  Also, if you miss three or more appointments without notifying the office, you may be dismissed from the clinic at the provider's discretion.      For prescription refill requests, have your pharmacy contact our office and allow 72 hours for refills to be completed.    Today you received the following chemotherapy and/or immunotherapy agents Taxol/Carboplatin.  Paclitaxel Injection What is this medication? PACLITAXEL (PAK li TAX el) treats some types of cancer. It works by slowing down the growth of cancer cells. This medicine may be used for other purposes; ask your health care provider or pharmacist if you have questions. COMMON BRAND NAME(S): Onxol, Taxol What should I tell my care team before I take this medication? They need to know if you have any of these conditions: Heart disease Liver disease Low white blood cell levels An unusual or allergic reaction to paclitaxel, other medications, foods, dyes, or preservatives If you or your partner are pregnant or trying to get pregnant Breast-feeding How should I use this medication? This medication is injected into a vein. It is given by your care team in a hospital or clinic setting. Talk to  your care team about the use of this medication in children. While it may be given to children for selected conditions, precautions do apply. Overdosage: If you think you have taken too much of this medicine contact a poison control center or emergency room at once. NOTE: This medicine is only for you. Do not share this medicine with others. What if I miss a dose? Keep appointments for follow-up doses. It is important not to miss your dose. Call your care team if you are unable to keep an appointment. What may interact with this medication? Do not take this medication with any of the following: Live virus vaccines Other medications may affect the way this medication works. Talk with your care team about all of the medications you take. They may suggest changes to your treatment plan to lower the risk of side effects and to make sure your medications work as intended. This list may not describe all possible interactions. Give your health care provider a list of all the medicines, herbs, non-prescription drugs, or dietary supplements you use. Also tell them if you smoke, drink alcohol, or use illegal drugs. Some items may interact with your medicine. What should I watch for while using this medication? Your condition will be monitored carefully while you are receiving this medication. You may need blood work while taking this medication. This medication may make you feel generally unwell. This is not uncommon as chemotherapy can affect healthy cells as well as cancer cells. Report any side effects. Continue your course of treatment even though   you feel ill unless your care team tells you to stop. This medication can cause serious allergic reactions. To reduce the risk, your care team may give you other medications to take before receiving this one. Be sure to follow the directions from your care team. This medication may increase your risk of getting an infection. Call your care team for advice if you  get a fever, chills, sore throat, or other symptoms of a cold or flu. Do not treat yourself. Try to avoid being around people who are sick. This medication may increase your risk to bruise or bleed. Call your care team if you notice any unusual bleeding. Be careful brushing or flossing your teeth or using a toothpick because you may get an infection or bleed more easily. If you have any dental work done, tell your dentist you are receiving this medication. Talk to your care team if you may be pregnant. Serious birth defects can occur if you take this medication during pregnancy. Talk to your care team before breastfeeding. Changes to your treatment plan may be needed. What side effects may I notice from receiving this medication? Side effects that you should report to your care team as soon as possible: Allergic reactions--skin rash, itching, hives, swelling of the face, lips, tongue, or throat Heart rhythm changes--fast or irregular heartbeat, dizziness, feeling faint or lightheaded, chest pain, trouble breathing Increase in blood pressure Infection--fever, chills, cough, sore throat, wounds that don't heal, pain or trouble when passing urine, general feeling of discomfort or being unwell Low blood pressure--dizziness, feeling faint or lightheaded, blurry vision Low red blood cell level--unusual weakness or fatigue, dizziness, headache, trouble breathing Painful swelling, warmth, or redness of the skin, blisters or sores at the infusion site Pain, tingling, or numbness in the hands or feet Slow heartbeat--dizziness, feeling faint or lightheaded, confusion, trouble breathing, unusual weakness or fatigue Unusual bruising or bleeding Side effects that usually do not require medical attention (report to your care team if they continue or are bothersome): Diarrhea Hair loss Joint pain Loss of appetite Muscle pain Nausea Vomiting This list may not describe all possible side effects. Call your  doctor for medical advice about side effects. You may report side effects to FDA at 1-800-FDA-1088. Where should I keep my medication? This medication is given in a hospital or clinic. It will not be stored at home. NOTE: This sheet is a summary. It may not cover all possible information. If you have questions about this medicine, talk to your doctor, pharmacist, or health care provider.  2024 Elsevier/Gold Standard (2021-05-16 00:00:00)    Carboplatin Injection What is this medication? CARBOPLATIN (KAR boe pla tin) treats some types of cancer. It works by slowing down the growth of cancer cells. This medicine may be used for other purposes; ask your health care provider or pharmacist if you have questions. COMMON BRAND NAME(S): Paraplatin What should I tell my care team before I take this medication? They need to know if you have any of these conditions: Blood disorders Hearing problems Kidney disease Recent or ongoing radiation therapy An unusual or allergic reaction to carboplatin, cisplatin, other medications, foods, dyes, or preservatives Pregnant or trying to get pregnant Breast-feeding How should I use this medication? This medication is injected into a vein. It is given by your care team in a hospital or clinic setting. Talk to your care team about the use of this medication in children. Special care may be needed. Overdosage: If you think you have taken   too much of this medicine contact a poison control center or emergency room at once. NOTE: This medicine is only for you. Do not share this medicine with others. What if I miss a dose? Keep appointments for follow-up doses. It is important not to miss your dose. Call your care team if you are unable to keep an appointment. What may interact with this medication? Medications for seizures Some antibiotics, such as amikacin, gentamicin, neomycin, streptomycin, tobramycin Vaccines This list may not describe all possible  interactions. Give your health care provider a list of all the medicines, herbs, non-prescription drugs, or dietary supplements you use. Also tell them if you smoke, drink alcohol, or use illegal drugs. Some items may interact with your medicine. What should I watch for while using this medication? Your condition will be monitored carefully while you are receiving this medication. You may need blood work while taking this medication. This medication may make you feel generally unwell. This is not uncommon, as chemotherapy can affect healthy cells as well as cancer cells. Report any side effects. Continue your course of treatment even though you feel ill unless your care team tells you to stop. In some cases, you may be given additional medications to help with side effects. Follow all directions for their use. This medication may increase your risk of getting an infection. Call your care team for advice if you get a fever, chills, sore throat, or other symptoms of a cold or flu. Do not treat yourself. Try to avoid being around people who are sick. Avoid taking medications that contain aspirin, acetaminophen, ibuprofen, naproxen, or ketoprofen unless instructed by your care team. These medications may hide a fever. Be careful brushing or flossing your teeth or using a toothpick because you may get an infection or bleed more easily. If you have any dental work done, tell your dentist you are receiving this medication. Talk to your care team if you wish to become pregnant or think you might be pregnant. This medication can cause serious birth defects. Talk to your care team about effective forms of contraception. Do not breast-feed while taking this medication. What side effects may I notice from receiving this medication? Side effects that you should report to your care team as soon as possible: Allergic reactions--skin rash, itching, hives, swelling of the face, lips, tongue, or throat Infection--fever,  chills, cough, sore throat, wounds that don't heal, pain or trouble when passing urine, general feeling of discomfort or being unwell Low red blood cell level--unusual weakness or fatigue, dizziness, headache, trouble breathing Pain, tingling, or numbness in the hands or feet, muscle weakness, change in vision, confusion or trouble speaking, loss of balance or coordination, trouble walking, seizures Unusual bruising or bleeding Side effects that usually do not require medical attention (report to your care team if they continue or are bothersome): Hair loss Nausea Unusual weakness or fatigue Vomiting This list may not describe all possible side effects. Call your doctor for medical advice about side effects. You may report side effects to FDA at 1-800-FDA-1088. Where should I keep my medication? This medication is given in a hospital or clinic. It will not be stored at home. NOTE: This sheet is a summary. It may not cover all possible information. If you have questions about this medicine, talk to your doctor, pharmacist, or health care provider.  2024 Elsevier/Gold Standard (2021-04-18 00:00:00)        To help prevent nausea and vomiting after your treatment, we encourage you to   take your nausea medication as directed.  BELOW ARE SYMPTOMS THAT SHOULD BE REPORTED IMMEDIATELY: *FEVER GREATER THAN 100.4 F (38 C) OR HIGHER *CHILLS OR SWEATING *NAUSEA AND VOMITING THAT IS NOT CONTROLLED WITH YOUR NAUSEA MEDICATION *UNUSUAL SHORTNESS OF BREATH *UNUSUAL BRUISING OR BLEEDING *URINARY PROBLEMS (pain or burning when urinating, or frequent urination) *BOWEL PROBLEMS (unusual diarrhea, constipation, pain near the anus) TENDERNESS IN MOUTH AND THROAT WITH OR WITHOUT PRESENCE OF ULCERS (sore throat, sores in mouth, or a toothache) UNUSUAL RASH, SWELLING OR PAIN  UNUSUAL VAGINAL DISCHARGE OR ITCHING   Items with * indicate a potential emergency and should be followed up as soon as possible or go  to the Emergency Department if any problems should occur.  Please show the CHEMOTHERAPY ALERT CARD or IMMUNOTHERAPY ALERT CARD at check-in to the Emergency Department and triage nurse.  Should you have questions after your visit or need to cancel or reschedule your appointment, please contact [facility] [phone]  and follow the prompts.  Office hours are 8:00 a.m. to 4:30 p.m. Monday - Friday. Please note that voicemails left after 4:00 p.m. may not be returned until the following business day.  We are closed weekends and major holidays. You have access to a nurse at all times for urgent questions. Please call the main number to the clinic [phone] and follow the prompts.  For any non-urgent questions, you may also contact your provider using MyChart. We now offer e-Visits for anyone 18 and older to request care online for non-urgent symptoms. For details visit [email or web address].   Also download the MyChart app! Go to the app store, search "MyChart", open the app, select [facility], and log in with your MyChart username and password.   

## 2022-07-06 ENCOUNTER — Other Ambulatory Visit: Payer: Self-pay

## 2022-07-06 ENCOUNTER — Ambulatory Visit
Admission: RE | Admit: 2022-07-06 | Discharge: 2022-07-06 | Disposition: A | Discharge status: Home / Self Care | Payer: Medicare Other | Source: Ambulatory Visit | Attending: Radiation Oncology | Admitting: Radiation Oncology

## 2022-07-06 DIAGNOSIS — Z510 Encounter for antineoplastic radiation therapy: Secondary | ICD-10-CM | POA: Diagnosis not present

## 2022-07-06 DIAGNOSIS — C3411 Malignant neoplasm of upper lobe, right bronchus or lung: Secondary | ICD-10-CM | POA: Diagnosis not present

## 2022-07-06 DIAGNOSIS — D3502 Benign neoplasm of left adrenal gland: Secondary | ICD-10-CM | POA: Diagnosis not present

## 2022-07-06 DIAGNOSIS — Z79899 Other long term (current) drug therapy: Secondary | ICD-10-CM | POA: Diagnosis not present

## 2022-07-06 DIAGNOSIS — F17210 Nicotine dependence, cigarettes, uncomplicated: Secondary | ICD-10-CM | POA: Diagnosis not present

## 2022-07-06 DIAGNOSIS — Z5111 Encounter for antineoplastic chemotherapy: Secondary | ICD-10-CM | POA: Diagnosis not present

## 2022-07-06 LAB — RAD ONC ARIA SESSION SUMMARY
Course Elapsed Days: 45
Plan Fractions Treated to Date: 3
Plan Prescribed Dose Per Fraction: 2 Gy
Plan Total Fractions Prescribed: 3
Plan Total Prescribed Dose: 6 Gy
Reference Point Dosage Given to Date: 6 Gy
Reference Point Session Dosage Given: 2 Gy
Session Number: 33

## 2022-07-07 ENCOUNTER — Other Ambulatory Visit: Payer: Self-pay

## 2022-07-09 NOTE — Radiation Completion Notes (Signed)
  Radiation Oncology         [phone]) [phone] ________________________________  Name: Carlos [name] MRN: [id number]  Date of Service: 07/06/2022  DOB: 06/08/1955  End of Treatment Note  Diagnosis:  Stage IIIA, [id number], NSCLC, adenocarcinoma of the RUL  Intent: Curative     ==========DELIVERED PLANS==========  First Treatment Date: 2022-05-22 - Last Treatment Date: 2022-07-06   Plan Name: Lung_R Site: Lung, Right Technique: 3D Mode: Photon Dose Per Fraction: 2 Gy Prescribed Dose (Delivered / Prescribed): 60 Gy / 60 Gy Prescribed Fxs (Delivered / Prescribed): 30 / 30   Plan Name: Lung_R_Bst Site: Lung, Right Technique: 3D Mode: Photon Dose Per Fraction: 2 Gy Prescribed Dose (Delivered / Prescribed): 6 Gy / 6 Gy Prescribed Fxs (Delivered / Prescribed): 3 / 3     ==========ON TREATMENT VISIT DATES========== 2022-05-25, 2022-06-01, 2022-06-08, 2022-06-15, 2022-06-22, 2022-06-29, 2022-07-06   See weekly On Treatment Notes in Epic for details. The patient tolerated radiation. He did not complain of fatigue, skin reaction in the field of therapy, or of esophagitis.  The patient will receive a call in about one month from the radiation oncology department. He will continue follow up with Dr. Ellin Saba as well.      Osker Mason, PAC

## 2022-07-17 ENCOUNTER — Other Ambulatory Visit (HOSPITAL_COMMUNITY): Payer: Self-pay | Admitting: Hematology

## 2022-07-19 ENCOUNTER — Ambulatory Visit (HOSPITAL_COMMUNITY)
Admission: RE | Admit: 2022-07-19 | Discharge: 2022-07-19 | Disposition: A | Discharge status: Home / Self Care | Payer: Medicare Other | Source: Ambulatory Visit | Attending: Hematology | Admitting: Hematology

## 2022-07-19 DIAGNOSIS — C3491 Malignant neoplasm of unspecified part of right bronchus or lung: Secondary | ICD-10-CM | POA: Insufficient documentation

## 2022-07-19 DIAGNOSIS — I700 Atherosclerosis of aorta: Secondary | ICD-10-CM | POA: Diagnosis not present

## 2022-07-19 DIAGNOSIS — R918 Other nonspecific abnormal finding of lung field: Secondary | ICD-10-CM | POA: Diagnosis not present

## 2022-07-19 MED ORDER — IOHEXOL 300 MG/ML  SOLN
75.0000 mL | Freq: Once | INTRAMUSCULAR | Status: AC | PRN
  Administered 2022-07-19: 75 mL via INTRAVENOUS

## 2022-07-25 ENCOUNTER — Other Ambulatory Visit: Payer: Self-pay

## 2022-07-25 DIAGNOSIS — C3491 Malignant neoplasm of unspecified part of right bronchus or lung: Secondary | ICD-10-CM

## 2022-07-25 NOTE — Progress Notes (Signed)
[facility] [address], [place] [address]    Clinic Day:  07/26/2022  Referring physician: Benita Stabile, MD  Patient Care Team: Benita Stabile, MD as PCP - General (Internal Medicine) Doreatha Massed, MD as Medical Oncologist (Medical Oncology) Therese Sarah, RN as Oncology Nurse Navigator (Medical Oncology)   ASSESSMENT & PLAN:   Assessment: 1.  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  No hemoptysis/weight loss. - Chest x-ray (04/10/2022): Mid right lung mass. - CT chest (04/13/2022): 9.2 x 5.7 x 6.6 cm right upper lobe mass with lobulations appearing to extend across the minor fissure into the right middle lobe and right lower lobe.  Satellite lesion in the right upper lobe measures 1.6 x 1.4 x 1.4 cm.  No findings of chest wall invasion.  Borderline prominent lower right paratracheal lymph node 0.9 cm.  Old granulomatous disease.  Small left adrenal adenoma. - Has right posterior chest wall pain for the past 6 months, dull type.  He is not requiring any pain medication. - Brain MRI (04/19/2022): No metastatic disease. - PET scan (04/19/2022): 9 x 5.7 cm right upper lobe mass with SUV 11.8.  1.3 x 0.9 cm satellite nodule in the right upper lobe with SUV 3.8.  No adenopathy or distant metastatic disease. - Bronchoscopy and biopsy by Dr. Delton Coombes - Pathology (04/23/2022): Both right upper lobe lung nodule FNA consistent with poorly differentiated carcinoma.  IHC diffusely positive for TTF-1, Napsin, synaptophysin and CK7.  CD56 negative.  P63 and CK5/6 negative.  Given Napsin positivity, favor adenocarcinoma.  There is also diffuse synaptophysin positivity consistent with neuroendocrine differentiation.  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 - Discussed with Dr. Dorris Fetch.  Not a surgical candidate as he requires pneumonectomy and has  suboptimal DLCO. - Chemoradiation with carboplatin and paclitaxel from 05/22/2022 through 07/06/2022   2.  Social/family history: - He is seen with his 2 sisters today.  Lives by himself and is independent of ADLs and IADLs.  He retired after working at [employer] for the last 34 years.  No asbestos exposure.  No other chemicals.  Smokes more than 1 pack/day for the last 50 years (started at age 11) quit smoking for 5 years before starting back again. - Brother died of lung cancer.  Father had bladder cancer.  Maternal uncle had cancer.    Plan: 1.  Stage IIIa adenocarcinoma of the right lung: - We reviewed CT chest from 07/19/2022: Slight decrease in size of the large right upper lobe mass with similar appearance of satellite nodule in the right upper lobe.  No adenopathy.  Multiple small lung nodules 4 mm or less are stable. - He has achieved at least a partial response.  As previously noted, he is not a candidate for surgical resection. - At this point I have recommended consolidation immunotherapy with durvalumab. - We discussed side effects of immunotherapy in detail including rare chance of pneumonitis/colitis/hypophysitis/hepatitis/myocarditis among others.  He does not have any active or inactive autoimmune problems. - Will proceed with consolidation durvalumab.   2.  Anxiety/depression: - Continue clonazepam 1 mg twice daily as needed.   3.  COPD: - He has cough without much expectoration on and off and comes in spells.  He has dizziness lasting few seconds after each spell of cough. - We will prescribe Tessalon Perles 200  mg 3 times daily as needed.    Orders Placed This Encounter  Procedures   CBC with Differential    Standing Status:   Future    Standing Expiration Date:   07/26/2023   Comprehensive metabolic panel    Standing Status:   Future    Standing Expiration Date:   07/26/2023   T4    Standing Status:   Future    Standing Expiration Date:   07/26/2023   TSH     Standing Status:   Future    Standing Expiration Date:   07/26/2023   CBC with Differential    Standing Status:   Future    Standing Expiration Date:   08/09/2023   Comprehensive metabolic panel    Standing Status:   Future    Standing Expiration Date:   08/09/2023   CBC with Differential    Standing Status:   Future    Standing Expiration Date:   08/23/2023   Comprehensive metabolic panel    Standing Status:   Future    Standing Expiration Date:   08/23/2023   T4    Standing Status:   Future    Standing Expiration Date:   08/23/2023   TSH    Standing Status:   Future    Standing Expiration Date:   08/23/2023   CBC with Differential    Standing Status:   Future    Standing Expiration Date:   09/06/2023   Comprehensive metabolic panel    Standing Status:   Future    Standing Expiration Date:   09/06/2023   CBC with Differential    Standing Status:   Future    Standing Expiration Date:   09/20/2023   Comprehensive metabolic panel    Standing Status:   Future    Standing Expiration Date:   09/20/2023   CBC with Differential    Standing Status:   Future    Standing Expiration Date:   10/04/2023   Comprehensive metabolic panel    Standing Status:   Future    Standing Expiration Date:   10/04/2023   T4    Standing Status:   Future    Standing Expiration Date:   10/04/2023   TSH    Standing Status:   Future    Standing Expiration Date:   10/04/2023   CBC with Differential    Standing Status:   Future    Standing Expiration Date:   10/18/2023   Comprehensive metabolic panel    Standing Status:   Future    Standing Expiration Date:   10/18/2023   T4    Standing Status:   Future    Standing Expiration Date:   10/18/2023   TSH    Standing Status:   Future    Standing Expiration Date:   10/18/2023   CBC with Differential    Standing Status:   Future    Standing Expiration Date:   11/01/2023   Comprehensive metabolic panel    Standing Status:   Future    Standing Expiration Date:    11/01/2023   T4    Standing Status:   Future    Standing Expiration Date:   11/01/2023   TSH    Standing Status:   Future    Standing Expiration Date:   11/01/2023      [name],acting as a scribe for Doreatha Massed, MD.,have documented all relevant documentation on the behalf of Doreatha Massed, MD,as directed by  Doreatha Massed,  MD while in the presence of Doreatha Massed, MD.  I, Doreatha Massed MD, have reviewed the above documentation for accuracy and completeness, and I agree with the above.    Doreatha Massed, MD   7/18/202412:48 PM  CHIEF COMPLAINT:   Diagnosis: RUL lung adenocarcinoma    Cancer Staging  Non-small cell cancer of right lung [facility]) Staging form: Lung, AJCC 8th Edition - Clinical stage from 05/01/2022: Stage IIIA (cT4, cN0, cM0) - Unsigned    Prior Therapy: none  Current Therapy:  concurrent chemoradiation with carboplatin + paclitaxel    HISTORY OF PRESENT ILLNESS:   Oncology History  Non-small cell cancer of right lung (HCC)  05/01/2022 Initial Diagnosis   Non-small cell cancer of right lung (HCC)   05/22/2022 - 07/05/2022 Chemotherapy   Patient is on Treatment Plan : LUNG Carboplatin + Paclitaxel + XRT q7d     07/26/2022 -  Chemotherapy   Patient is on Treatment Plan : LUNG Durvalumab (10) q14d        INTERVAL HISTORY:   Carlos [name] is a 67 y.o. male presenting to clinic today for follow up of RUL lung adenocarcinoma. He was last seen by me on 07/05/22.  He underwent a CT of the chest on 7/11 which found slight decreased size of large right upper lobe mass, with similar appearance of aggressive appearing satellite nodule in the right upper lobe, no mediastinal or hilar lymphadenopathy, multiple small pulmonary nodules noted elsewhere in the lungs measuring 4 mm or less in size which are stable and nonspecific, aortic atherosclerosis, in addition to two-vessel coronary artery disease  Today, he states that he is  doing well overall. His appetite level is at 100%. His energy level is at 75%.  PAST MEDICAL HISTORY:   Past Medical History: Past Medical History:  Diagnosis Date   Anxiety    Cancer (HCC)    Skin cancer left shoulder   Chronic low back pain    Complication of anesthesia    Dermatofibrosarcoma protubera of right shoulder    Dyspnea    GERD (gastroesophageal reflux disease)    Hyperlipidemia    Lung cancer (HCC) 04/23/2022   PONV (postoperative nausea and vomiting)    Port-A-Cath in place 05/17/2022   Prostate cancer [facility])     Surgical History: Past Surgical History:  Procedure Laterality Date   APPENDECTOMY     APPLICATION OF A-CELL OF EXTREMITY Right 07/09/2012   Procedure: PLACEMENT OF A-CELL TO UPPER EXTREMITY/PLACEMENT OF VAC;  Surgeon: Wayland Denis, DO;  Location: [facility];  Service: Plastics;  Laterality: Right;   BRONCHIAL BRUSHINGS  04/23/2022   Procedure: BRONCHIAL BRUSHINGS;  Surgeon: Leslye Peer, MD;  Location: [facility] ENDOSCOPY;  Service: Pulmonary;;   BRONCHIAL NEEDLE ASPIRATION BIOPSY  04/23/2022   Procedure: BRONCHIAL NEEDLE ASPIRATION BIOPSIES;  Surgeon: Leslye Peer, MD;  Location: [facility] ENDOSCOPY;  Service: Pulmonary;;   COLONOSCOPY WITH PROPOFOL N/A 09/30/2015   Procedure: COLONOSCOPY WITH PROPOFOL;  Surgeon: Malissa Hippo, MD;  Location: [facility];  Service: Endoscopy;  Laterality: N/A;  830   EXCISION DERMATOFIBROCARCOMA PROTUBERAN RIGHT SHOULDER  2 - 3 WKS AGO   HEMOSTASIS CONTROL  04/23/2022   Procedure: HEMOSTASIS CONTROL;  Surgeon: Leslye Peer, MD;  Location: [facility] ENDOSCOPY;  Service: Pulmonary;;   INGUINAL HERNIA REPAIR Left 1999   IR IMAGING GUIDED PORT INSERTION  04/30/2022   KNEE ARTHROSCOPY Left 2012   POLYPECTOMY  09/30/2015   Procedure: POLYPECTOMY;  Surgeon: Malissa Hippo, MD;  Location: [facility];  Service: Endoscopy;;  sigmoid   VIDEO BRONCHOSCOPY WITH ENDOBRONCHIAL ULTRASOUND Bilateral 04/23/2022   Procedure: VIDEO  BRONCHOSCOPY WITH ENDOBRONCHIAL ULTRASOUND;  Surgeon: Leslye Peer, MD;  Location: [facility] ENDOSCOPY;  Service: Pulmonary;  Laterality: Bilateral;   VIDEO BRONCHOSCOPY WITH RADIAL ENDOBRONCHIAL ULTRASOUND  04/23/2022   Procedure: VIDEO BRONCHOSCOPY WITH RADIAL ENDOBRONCHIAL ULTRASOUND;  Surgeon: Leslye Peer, MD;  Location: [facility] ENDOSCOPY;  Service: Pulmonary;;    Social History: Social History   Socioeconomic History   Marital status: Divorced    Spouse name: Burna Mortimer   Number of children: 1   Years of education: 10th   Highest education level: Not on file  Occupational History    Employer: [employer]    Comment: [employer]  Tobacco Use   Smoking status: Every Day    Current packs/day: 1.00    Average packs/day: 1 pack/day for 40.0 years (40.0 ttl pk-yrs)    Types: Cigarettes   Smokeless tobacco: Never  Vaping Use   Vaping status: Never Used  Substance and Sexual Activity   Alcohol use: Yes    Comment: 2-3 cans beer/day   Drug use: No   Sexual activity: Not on file  Other Topics Concern   Not on file  Social History Narrative   Patient lives at home with his dog. 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 Patient has 10 grade education.Patient is right-handed.Caffeine Use: 1 cup of coffee; 3-4 sodas daily   Social Determinants of Health   Financial Resource Strain: High Risk (05/10/2022)   Overall Financial Resource Strain (CARDIA)    Difficulty of Paying Living Expenses: Very hard  Food Insecurity: Food Insecurity Present (05/04/2022)   Hunger Vital Sign    Worried About Running Out of Food in the Last Year: Sometimes true    Ran Out of Food in the Last Year: Sometimes true  Transportation Needs: No Transportation Needs (05/04/2022)   PRAPARE - Administrator, Civil Service (Medical): No    Lack of Transportation (Non-Medical): No  Physical Activity: Not on file  Stress: Not on file  Social Connections: Not on file   Intimate Partner Violence: Not At Risk (05/04/2022)   Humiliation, Afraid, Rape, and Kick questionnaire    Fear of Current or Ex-Partner: No    Emotionally Abused: No    Physically Abused: No    Sexually Abused: No    Family History: Family History  Problem Relation Age of Onset   Dementia Mother    Cancer Father     Current Medications:  Current Outpatient Medications:    acetaminophen (TYLENOL) 500 MG tablet, Take 1,000 mg by mouth every 6 (six) hours as needed (pain.)., Disp: , Rfl:    albuterol (VENTOLIN HFA) 108 (90 Base) MCG/ACT inhaler, Inhale 2 puffs into the lungs every 6 (six) hours as needed for wheezing or shortness of breath., Disp: 8 g, Rfl: 6   aspirin EC 81 MG tablet, Take 81 mg by mouth at bedtime. Swallow whole., Disp: , Rfl:    benzonatate (TESSALON) 200 MG capsule, Take 1 capsule (200 mg total) by mouth 3 (three) times daily as needed for cough., Disp: 60 capsule, Rfl: 1   clonazePAM (KLONOPIN) 1 MG disintegrating tablet, Take one tablet by mouth every morning and two tablets by mouth at bedtime, Disp: 90 tablet, Rfl: 2   meloxicam (MOBIC) 15 MG tablet, Take 15 mg by mouth in the morning., Disp: , Rfl:  pantoprazole (PROTONIX) 40 MG tablet, Take 40 mg by mouth daily., Disp: , Rfl:    simvastatin (ZOCOR) 20 MG tablet, Take 20 mg by mouth at bedtime., Disp: , Rfl: 2   sucralfate (CARAFATE) 1 g tablet, Take 1 tablet (1 g total) by mouth 4 (four) times daily. Dissolve each tablet in 15 cc water before use., Disp: 120 tablet, Rfl: 2   tamsulosin (FLOMAX) 0.4 MG CAPS capsule, Take 1 capsule (0.4 mg total) by mouth daily. (Patient taking differently: Take 0.4 mg by mouth at bedtime.), Disp: 30 capsule, Rfl: 11   CARBOPLATIN IV, Inject into the vein once a week., Disp: , Rfl:    PACLitaxel (TAXOL IV), Inject into the vein once a week., Disp: , Rfl:    Allergies: Allergies  Allergen Reactions   Ciprofloxacin Swelling and Other (See Comments)    Swelling in hands,  numbness in arms and joint pain   Gabapentin Other (See Comments)    Double vision    REVIEW OF SYSTEMS:   Review of Systems  Constitutional:  Negative for chills, fatigue and fever.  HENT:   Negative for lump/mass, mouth sores, nosebleeds, sore throat and trouble swallowing.   Eyes:  Negative for eye problems.  Respiratory:  Positive for cough and shortness of breath.   Cardiovascular:  Negative for chest pain, leg swelling and palpitations.  Gastrointestinal:  Negative for abdominal pain, constipation, diarrhea, nausea and vomiting.  Genitourinary:  Negative for bladder incontinence, difficulty urinating, dysuria, frequency, hematuria and nocturia.   Musculoskeletal:  Negative for arthralgias, back pain, flank pain, myalgias and neck pain.  Skin:  Negative for itching and rash.  Neurological:  Positive for dizziness. Negative for headaches and numbness.  Hematological:  Does not bruise/bleed easily.  Psychiatric/Behavioral:  Positive for sleep disturbance. Negative for depression and suicidal ideas. The patient is not nervous/anxious.   All other systems reviewed and are negative.    VITALS:   There were no vitals taken for this visit.  Wt Readings from Last 3 Encounters:  07/26/22 180 lb 12.8 oz (82 kg)  07/05/22 181 lb 9.6 oz (82.4 kg)  06/28/22 183 lb 9.6 oz (83.3 kg)    There is no height or weight on file to calculate BMI.  Performance status (ECOG): 1 - Symptomatic but completely ambulatory  PHYSICAL EXAM:   Physical Exam Vitals and nursing note reviewed. Exam conducted with a chaperone present.  Constitutional:      Appearance: Normal appearance.  Cardiovascular:     Rate and Rhythm: Normal rate and regular rhythm.     Pulses: Normal pulses.     Heart sounds: Normal heart sounds.  Pulmonary:     Effort: Pulmonary effort is normal.     Breath sounds: Normal breath sounds.  Abdominal:     Palpations: Abdomen is soft. There is no hepatomegaly, splenomegaly or  mass.     Tenderness: There is no abdominal tenderness.  Musculoskeletal:     Right lower leg: No edema.     Left lower leg: No edema.  Lymphadenopathy:     Cervical: No cervical adenopathy.     Right cervical: No superficial, deep or posterior cervical adenopathy.    Left cervical: No superficial, deep or posterior cervical adenopathy.     Upper Body:     Right upper body: No supraclavicular or axillary adenopathy.     Left upper body: No supraclavicular or axillary adenopathy.  Neurological:     General: No focal deficit present.  Mental Status: He is alert and oriented to person, place, and time.  Psychiatric:        Mood and Affect: Mood normal.        Behavior: Behavior normal.     LABS:      Latest Ref Rng & Units 07/26/2022    8:44 AM 07/05/2022    9:49 AM 06/28/2022    7:44 AM  CBC  WBC 4.0 - 10.5 K/uL 7.5  4.5  5.2   Hemoglobin 13.0 - 17.0 g/dL 16.1  09.6  04.5   Hematocrit 39.0 - 52.0 % 38.0  38.5  38.1   Platelets 150 - 400 K/uL 233  279  239       Latest Ref Rng & Units 07/26/2022    8:44 AM 07/05/2022    9:49 AM 06/28/2022    7:44 AM  CMP  Glucose 70 - 99 mg/dL 409  811  914   BUN 8 - 23 mg/dL 10  10  12    Creatinine 0.61 - 1.24 mg/dL 7.82  9.56  2.13   Sodium 135 - 145 mmol/L 131  132  131   Potassium 3.5 - 5.1 mmol/L 3.5  3.5  4.0   Chloride 98 - 111 mmol/L 101  101  101   CO2 22 - 32 mmol/L 22  22  22    Calcium 8.9 - 10.3 mg/dL 8.9  9.0  8.9   Total Protein 6.5 - 8.1 g/dL 7.1  7.2  6.9   Total Bilirubin 0.3 - 1.2 mg/dL 0.8  0.6  0.6   Alkaline Phos 38 - 126 U/L 67  53  52   AST 15 - 41 U/L 21  20  20    ALT 0 - 44 U/L 20  23  20       No results found for: "CEA1", "CEA" / No results found for: "CEA1", "CEA" Lab Results  Component Value Date   PSA1 4.7 (H) 04/12/2022   No results found for: "YQM578" No results found for: "CAN125"  No results found for: "TOTALPROTELP", "ALBUMINELP", "A1GS", "A2GS", "BETS", "BETA2SER", "GAMS", "MSPIKE",  "SPEI" No results found for: "TIBC", "FERRITIN", "IRONPCTSAT" No results found for: "LDH"   STUDIES:   CT Chest W Contrast  Result Date: 07/20/2022 CLINICAL DATA:  67 year old male with history of non-small cell lung cancer status post chemotherapy and radiation therapy. Follow-up study. * Tracking Code: BO * EXAM: CT CHEST WITH CONTRAST TECHNIQUE: Multidetector CT imaging of the chest was performed during intravenous contrast administration. RADIATION DOSE REDUCTION: This exam was performed according to the departmental dose-optimization program which includes automated exposure control, adjustment of the mA and/or kV according to patient size and/or use of iterative reconstruction technique. CONTRAST:  75mL OMNIPAQUE IOHEXOL 300 MG/ML  SOLN COMPARISON:  Chest CTA 04/29/2022.  PET-CT 04/19/2022. FINDINGS: Cardiovascular: Heart size is normal. There is no significant pericardial fluid, thickening or pericardial calcification. There is aortic atherosclerosis, as well as atherosclerosis of the great vessels of the mediastinum and the coronary arteries, including calcified atherosclerotic plaque in the left circumflex and right coronary arteries. Left internal jugular single-lumen Port-A-Cath with tip terminating at the right atrium. Mediastinum/Nodes: No pathologically enlarged mediastinal or hilar lymph nodes. Esophagus is unremarkable in appearance. No axillary lymphadenopathy. Lungs/Pleura: Previously noted right upper lobe mass (axial image 72 of series 2 and coronal image 77 of series 5) currently measures 6.5 x 4.3 x 3.7 cm and is heterogeneous in appearance with probable areas of necrosis most evident laterally.  Satellite nodule in the right upper lobe (axial image 50 of series 4 and coronal image 74 of series 5) has an irregular macrolobulated and spiculated margin with internal air bronchograms, currently measuring 1.4 x 1.8 x 1.4 cm. Ground-glass attenuation, septal thickening and mild architectural  distortion are noted in the surrounding lung parenchyma, most evident in the right upper lobe, likely to reflect postobstructive changes, and/or evolving post treatment changes. A few scattered calcified granulomas are noted. Other small noncalcified pulmonary nodules are also noted in the lungs bilaterally measuring 4 mm or less in size, largest of which is in the left lower lobe (axial image 122 of series 4), similar in retrospect compared to prior examinations. No confluent consolidative airspace disease. No pleural effusions. Upper Abdomen: Aortic atherosclerosis. Diffuse low attenuation throughout the visualized hepatic parenchyma, indicative of a background of hepatic steatosis. Musculoskeletal: There are no aggressive appearing lytic or blastic lesions noted in the visualized portions of the skeleton. IMPRESSION: 1. Slight decreased size of large right upper lobe mass, with similar appearance of aggressive appearing satellite nodule in the right upper lobe, as detailed above. Continued attention on follow-up studies is recommended. 2. No mediastinal or hilar lymphadenopathy. 3. Multiple small pulmonary nodules noted elsewhere in the lungs measuring 4 mm or less in size, stable and nonspecific. Continued attention on follow-up imaging is recommended. 4. Aortic atherosclerosis, in addition to two-vessel coronary artery disease. Please note that although the presence of coronary artery calcium documents the presence of coronary artery disease, the severity of this disease and any potential stenosis cannot be assessed on this non-gated CT examination. Assessment for potential risk factor modification, dietary therapy or pharmacologic therapy may be warranted, if clinically indicated. 5. Hepatic steatosis. Aortic Atherosclerosis (ICD10-I70.0). Electronically Signed   By: Trudie Reed M.D.   On: 07/20/2022 08:11

## 2022-07-26 ENCOUNTER — Inpatient Hospital Stay: Payer: Medicare Other

## 2022-07-26 ENCOUNTER — Encounter: Payer: Self-pay | Admitting: Hematology

## 2022-07-26 ENCOUNTER — Inpatient Hospital Stay (HOSPITAL_BASED_OUTPATIENT_CLINIC_OR_DEPARTMENT_OTHER): Payer: Medicare Other | Admitting: Hematology

## 2022-07-26 ENCOUNTER — Inpatient Hospital Stay: Payer: Medicare Other | Attending: Hematology

## 2022-07-26 VITALS — BP 144/92 | HR 105 | Temp 98.1°F | Resp 19 | Wt 180.8 lb

## 2022-07-26 DIAGNOSIS — Z95828 Presence of other vascular implants and grafts: Secondary | ICD-10-CM

## 2022-07-26 DIAGNOSIS — D3502 Benign neoplasm of left adrenal gland: Secondary | ICD-10-CM | POA: Diagnosis not present

## 2022-07-26 DIAGNOSIS — C3491 Malignant neoplasm of unspecified part of right bronchus or lung: Secondary | ICD-10-CM | POA: Diagnosis not present

## 2022-07-26 DIAGNOSIS — Z8546 Personal history of malignant neoplasm of prostate: Secondary | ICD-10-CM | POA: Diagnosis not present

## 2022-07-26 DIAGNOSIS — Z79899 Other long term (current) drug therapy: Secondary | ICD-10-CM | POA: Diagnosis not present

## 2022-07-26 DIAGNOSIS — F17210 Nicotine dependence, cigarettes, uncomplicated: Secondary | ICD-10-CM | POA: Diagnosis not present

## 2022-07-26 DIAGNOSIS — C3411 Malignant neoplasm of upper lobe, right bronchus or lung: Secondary | ICD-10-CM | POA: Diagnosis not present

## 2022-07-26 DIAGNOSIS — J449 Chronic obstructive pulmonary disease, unspecified: Secondary | ICD-10-CM | POA: Diagnosis not present

## 2022-07-26 DIAGNOSIS — Z85828 Personal history of other malignant neoplasm of skin: Secondary | ICD-10-CM | POA: Diagnosis not present

## 2022-07-26 DIAGNOSIS — Z5111 Encounter for antineoplastic chemotherapy: Secondary | ICD-10-CM | POA: Insufficient documentation

## 2022-07-26 DIAGNOSIS — F419 Anxiety disorder, unspecified: Secondary | ICD-10-CM | POA: Insufficient documentation

## 2022-07-26 DIAGNOSIS — F32A Depression, unspecified: Secondary | ICD-10-CM | POA: Diagnosis not present

## 2022-07-26 LAB — CBC WITH DIFFERENTIAL/PLATELET
Abs Immature Granulocytes: 0.04 10*3/uL (ref 0.00–0.07)
Basophils Absolute: 0 10*3/uL (ref 0.0–0.1)
Basophils Relative: 1 %
Eosinophils Absolute: 0.1 10*3/uL (ref 0.0–0.5)
Eosinophils Relative: 1 %
HCT: 38 % — ABNORMAL LOW (ref 39.0–52.0)
Hemoglobin: 13.4 g/dL (ref 13.0–17.0)
Immature Granulocytes: 1 %
Lymphocytes Relative: 5 %
Lymphs Abs: 0.4 10*3/uL — ABNORMAL LOW (ref 0.7–4.0)
MCH: 32.8 pg (ref 26.0–34.0)
MCHC: 35.3 g/dL (ref 30.0–36.0)
MCV: 93.1 fL (ref 80.0–100.0)
Monocytes Absolute: 0.9 10*3/uL (ref 0.1–1.0)
Monocytes Relative: 12 %
Neutro Abs: 6.1 10*3/uL (ref 1.7–7.7)
Neutrophils Relative %: 80 %
Platelets: 233 10*3/uL (ref 150–400)
RBC: 4.08 MIL/uL — ABNORMAL LOW (ref 4.22–5.81)
RDW: 16.6 % — ABNORMAL HIGH (ref 11.5–15.5)
WBC: 7.5 10*3/uL (ref 4.0–10.5)
nRBC: 0 % (ref 0.0–0.2)

## 2022-07-26 LAB — COMPREHENSIVE METABOLIC PANEL
ALT: 20 U/L (ref 0–44)
AST: 21 U/L (ref 15–41)
Albumin: 3.7 g/dL (ref 3.5–5.0)
Alkaline Phosphatase: 67 U/L (ref 38–126)
Anion gap: 8 (ref 5–15)
BUN: 10 mg/dL (ref 8–23)
CO2: 22 mmol/L (ref 22–32)
Calcium: 8.9 mg/dL (ref 8.9–10.3)
Chloride: 101 mmol/L (ref 98–111)
Creatinine, Ser: 0.84 mg/dL (ref 0.61–1.24)
GFR, Estimated: >60 mL/min (ref >60)
Glucose, Bld: 103 mg/dL — ABNORMAL HIGH (ref 70–99)
Potassium: 3.5 mmol/L (ref 3.5–5.1)
Sodium: 131 mmol/L — ABNORMAL LOW (ref 135–145)
Total Bilirubin: 0.8 mg/dL (ref 0.3–1.2)
Total Protein: 7.1 g/dL (ref 6.5–8.1)

## 2022-07-26 LAB — TSH: TSH: 2.214 u[IU]/mL (ref 0.350–4.500)

## 2022-07-26 LAB — MAGNESIUM: Magnesium: 1.8 mg/dL (ref 1.7–2.4)

## 2022-07-26 MED ORDER — BENZONATATE 200 MG PO CAPS: 200.0000 mg | ORAL_CAPSULE | Freq: Three times a day (TID) | ORAL | 1 refills | Status: DC | PRN

## 2022-07-26 MED ORDER — SODIUM CHLORIDE 0.9% FLUSH
10.0000 mL | INTRAVENOUS | Status: DC | PRN
  Administered 2022-07-26: 10 mL via INTRAVENOUS

## 2022-07-26 NOTE — Progress Notes (Signed)
Patient presents today for Imfinzi infusion per providers order.  Vital signs and labs reviewed by MD.  Message received from Chapman Moss RN/Dr. Ellin Saba patient okay for treatment pending authorization.    Patient rescheduled for tomorrow due to pending insurance authorization.

## 2022-07-26 NOTE — Progress Notes (Signed)
Patient has been examined by Dr. Ellin Saba. Vital signs (HR 105) and labs have been reviewed by MD - ANC, Creatinine, LFTs, hemoglobin, and platelets are within treatment parameters per M.D. - pt may proceed with treatment.  Primary RN and pharmacy notified.

## 2022-07-26 NOTE — Progress Notes (Signed)
Patients port flushed without difficulty.  Good blood return noted with no bruising or swelling noted at site.  Patient remains accessed for treatment.  

## 2022-07-26 NOTE — Progress Notes (Signed)
ON PATHWAY REGIMEN - Non-Small Cell Lung  No Change  Continue With Treatment as Ordered.  Original Decision Date/Time: 05/04/2022 09:23     A cycle is every 7 days, concurrent with RT:     Paclitaxel      Carboplatin   **Always confirm dose/schedule in your pharmacy ordering system**  Patient Characteristics: Preoperative or Nonsurgical Candidate (Clinical Staging), Stage III - Nonsurgical Candidate (Nonsquamous and Squamous), PS = 0, 1 Therapeutic Status: Preoperative or Nonsurgical Candidate (Clinical Staging) AJCC T Category: cT4 AJCC N Category: cN0 AJCC M Category: cM0 AJCC 8 Stage Grouping: IIIA ECOG Performance Status: 0 Intent of Therapy: Curative Intent, Discussed with Patient

## 2022-07-26 NOTE — Patient Instructions (Addendum)
[facility] at [facility] Discharge Instructions   You were seen and examined today by Dr. Ellin Saba.  He reviewed the results of your lab work which are normal/stable.   He reviewed the results of your CT scan. It shows improvement in the known tumor in the lung and did not show anything new as far as cancer is concerned.   We will proceed with your immunotherapy treatment today.   We sent a prescription to [facility] for some pills that should help you with your cough.   Return as scheduled.    Thank you for choosing [facility] at [facility] to provide your oncology and hematology care.  To afford each patient quality time with our provider, please arrive at least 15 minutes before your scheduled appointment time.   If you have a lab appointment with the Cancer Center please come in thru the Main Entrance and check in at the main information desk.  You need to re-schedule your appointment should you arrive 10 or more minutes late.  We strive to give you quality time with our providers, and arriving late affects you and other patients whose appointments are after yours.  Also, if you no show three or more times for appointments you may be dismissed from the clinic at the providers discretion.     Again, thank you for choosing [facility].  Our hope is that these requests will decrease the amount of time that you wait before being seen by our physicians.       _____________________________________________________________  Should you have questions after your visit to [facility], please contact our office at [phone] and follow the prompts.  Our office hours are 8:00 a.m. and 4:30 p.m. Monday - Friday.  Please note that voicemails left after 4:00 p.m. may not be returned until the following business day.  We are closed weekends and major holidays.  You do have access to a nurse 24-7, just call the main number to the  clinic [phone] and do not press any options, hold on the line and a nurse will answer the phone.    For prescription refill requests, have your pharmacy contact our office and allow 72 hours.    Due to Covid, you will need to wear a mask upon entering the hospital. If you do not have a mask, a mask will be given to you at the Main Entrance upon arrival. For doctor visits, patients may have 1 support person age 86 or older with them. For treatment visits, patients can not have anyone with them due to social distancing guidelines and our immunocompromised population.

## 2022-07-26 NOTE — Progress Notes (Signed)
DISCONTINUE ON PATHWAY REGIMEN - Non-Small Cell Lung     A cycle is every 7 days, concurrent with RT:     Paclitaxel      Carboplatin   **Always confirm dose/schedule in your pharmacy ordering system**  REASON: Continuation Of Treatment PRIOR TREATMENT: [id number]: Carboplatin AUC=2 + Paclitaxel 45 mg/m2 Weekly During Radiation TREATMENT RESPONSE: Partial Response (PR)  START ON PATHWAY REGIMEN - Non-Small Cell Lung     A cycle is every 14 days:     Durvalumab   **Always confirm dose/schedule in your pharmacy ordering system**  Patient Characteristics: Preoperative or Nonsurgical Candidate (Clinical Staging), Stage III - Nonsurgical Candidate (Nonsquamous and Squamous), PS = 0, 1 Therapeutic Status: Preoperative or Nonsurgical Candidate (Clinical Staging) AJCC T Category: cT4 AJCC N Category: cN0 AJCC M Category: cM0 AJCC 8 Stage Grouping: IIIA ECOG Performance Status: 0 Intent of Therapy: Curative Intent, Discussed with Patient

## 2022-07-27 ENCOUNTER — Encounter: Payer: Self-pay | Admitting: Hematology

## 2022-07-27 ENCOUNTER — Inpatient Hospital Stay: Payer: Medicare Other

## 2022-07-27 VITALS — BP 139/83 | HR 78 | Temp 97.6°F | Resp 18

## 2022-07-27 DIAGNOSIS — F32A Depression, unspecified: Secondary | ICD-10-CM | POA: Diagnosis not present

## 2022-07-27 DIAGNOSIS — D3502 Benign neoplasm of left adrenal gland: Secondary | ICD-10-CM | POA: Diagnosis not present

## 2022-07-27 DIAGNOSIS — Z85828 Personal history of other malignant neoplasm of skin: Secondary | ICD-10-CM | POA: Diagnosis not present

## 2022-07-27 DIAGNOSIS — J449 Chronic obstructive pulmonary disease, unspecified: Secondary | ICD-10-CM | POA: Diagnosis not present

## 2022-07-27 DIAGNOSIS — C3491 Malignant neoplasm of unspecified part of right bronchus or lung: Secondary | ICD-10-CM

## 2022-07-27 DIAGNOSIS — Z5111 Encounter for antineoplastic chemotherapy: Secondary | ICD-10-CM | POA: Diagnosis not present

## 2022-07-27 DIAGNOSIS — F17210 Nicotine dependence, cigarettes, uncomplicated: Secondary | ICD-10-CM | POA: Diagnosis not present

## 2022-07-27 DIAGNOSIS — Z79899 Other long term (current) drug therapy: Secondary | ICD-10-CM | POA: Diagnosis not present

## 2022-07-27 DIAGNOSIS — Z95828 Presence of other vascular implants and grafts: Secondary | ICD-10-CM

## 2022-07-27 DIAGNOSIS — C3411 Malignant neoplasm of upper lobe, right bronchus or lung: Secondary | ICD-10-CM | POA: Diagnosis not present

## 2022-07-27 MED ORDER — HEPARIN SOD (PORK) LOCK FLUSH 100 UNIT/ML IV SOLN
500.0000 [IU] | Freq: Once | INTRAVENOUS | Status: AC | PRN
  Administered 2022-07-27: 500 [IU]

## 2022-07-27 MED ORDER — SODIUM CHLORIDE 0.9 % IV SOLN
10.0000 mg/kg | Freq: Once | INTRAVENOUS | Status: AC
  Administered 2022-07-27: 860 mg via INTRAVENOUS
  Filled 2022-07-27: qty 10

## 2022-07-27 MED ORDER — SODIUM CHLORIDE 0.9% FLUSH
10.0000 mL | INTRAVENOUS | Status: DC | PRN
  Administered 2022-07-27: 10 mL

## 2022-07-27 MED ORDER — SODIUM CHLORIDE 0.9 % IV SOLN: Freq: Once | INTRAVENOUS | Status: AC

## 2022-07-27 NOTE — Progress Notes (Signed)
Pharmacist Chemotherapy Monitoring - Initial Assessment    Anticipated start date: 07/27/22   The following has been reviewed per standard work regarding the patient's treatment regimen: The patient's diagnosis, treatment plan and drug doses, and organ/hematologic function Lab orders and baseline tests specific to treatment regimen  The treatment plan start date, drug sequencing, and pre-medications Prior authorization status  Patient's documented medication list, including drug-drug interaction screen and prescriptions for anti-emetics and supportive care specific to the treatment regimen The drug concentrations, fluid compatibility, administration routes, and timing of the medications to be used The patient's access for treatment and lifetime cumulative dose history, if applicable  The patient's medication allergies and previous infusion related reactions, if applicable   Changes made to treatment plan:  N/A  Follow up needed:  N/A   Carlos [name], [facility], 07/27/2022  10:53 AM

## 2022-07-27 NOTE — Progress Notes (Signed)
Patient presents today for D1C1 Imfinzi infusion per providers order.  Vital signs and labs within parameters for treatment.    Treatment given today per MD orders.  Stable during infusion without adverse affects.  Vital signs stable.  No complaints at this time.  Discharge from clinic ambulatory in stable condition.  Alert and oriented X 3.  Follow up with [facility] as scheduled.

## 2022-07-27 NOTE — Patient Instructions (Signed)
[facility] [place]  Discharge Instructions: Thank you for choosing [facility] to provide your oncology and hematology care.  If you have a lab appointment with the Cancer Center - please note that after April 8th, 2024, all labs will be drawn in the cancer center.  You do not have to check in or register with the main entrance as you have in the past but will complete your check-in in the cancer center.  Wear comfortable clothing and clothing appropriate for easy access to any Portacath or PICC line.   We strive to give you quality time with your provider. You may need to reschedule your appointment if you arrive late (15 or more minutes).  Arriving late affects you and other patients whose appointments are after yours.  Also, if you miss three or more appointments without notifying the office, you may be dismissed from the clinic at the provider's discretion.      For prescription refill requests, have your pharmacy contact our office and allow 72 hours for refills to be completed.    Today you received the following chemotherapy and/or immunotherapy agents Imfinzi      To help prevent nausea and vomiting after your treatment, we encourage you to take your nausea medication as directed.  BELOW ARE SYMPTOMS THAT SHOULD BE REPORTED IMMEDIATELY: *FEVER GREATER THAN 100.4 F (38 C) OR HIGHER *CHILLS OR SWEATING *NAUSEA AND VOMITING THAT IS NOT CONTROLLED WITH YOUR NAUSEA MEDICATION *UNUSUAL SHORTNESS OF BREATH *UNUSUAL BRUISING OR BLEEDING *URINARY PROBLEMS (pain or burning when urinating, or frequent urination) *BOWEL PROBLEMS (unusual diarrhea, constipation, pain near the anus) TENDERNESS IN MOUTH AND THROAT WITH OR WITHOUT PRESENCE OF ULCERS (sore throat, sores in mouth, or a toothache) UNUSUAL RASH, SWELLING OR PAIN  UNUSUAL VAGINAL DISCHARGE OR ITCHING   Items with * indicate a potential emergency and should be followed up as soon as possible or go to the  Emergency Department if any problems should occur.  Please show the CHEMOTHERAPY ALERT CARD or IMMUNOTHERAPY ALERT CARD at check-in to the Emergency Department and triage nurse.  Should you have questions after your visit or need to cancel or reschedule your appointment, please contact [facility] [place] [phone]  and follow the prompts.  Office hours are 8:00 a.m. to 4:30 p.m. Monday - Friday. Please note that voicemails left after 4:00 p.m. may not be returned until the following business day.  We are closed weekends and major holidays. You have access to a nurse at all times for urgent questions. Please call the main number to the clinic [phone] and follow the prompts.  For any non-urgent questions, you may also contact your provider using MyChart. We now offer e-Visits for anyone 18 and older to request care online for non-urgent symptoms. For details visit [email or web address].   Also download the MyChart app! Go to the app store, search "MyChart", open the app, select [place], and log in with your MyChart username and password.   

## 2022-07-30 ENCOUNTER — Other Ambulatory Visit: Payer: Self-pay

## 2022-07-30 MED ORDER — BENZONATATE 200 MG PO CAPS: 200.0000 mg | ORAL_CAPSULE | Freq: Three times a day (TID) | ORAL | 1 refills | Status: DC | PRN

## 2022-08-03 NOTE — Progress Notes (Addendum)
Late entry from 722/24   24 hour call back, no answer message left.

## 2022-08-06 DIAGNOSIS — C3411 Malignant neoplasm of upper lobe, right bronchus or lung: Secondary | ICD-10-CM | POA: Diagnosis not present

## 2022-08-07 ENCOUNTER — Encounter (HOSPITAL_COMMUNITY): Payer: Self-pay

## 2022-08-07 ENCOUNTER — Other Ambulatory Visit: Payer: Self-pay

## 2022-08-07 DIAGNOSIS — R918 Other nonspecific abnormal finding of lung field: Secondary | ICD-10-CM

## 2022-08-07 DIAGNOSIS — C3491 Malignant neoplasm of unspecified part of right bronchus or lung: Secondary | ICD-10-CM

## 2022-08-08 NOTE — Progress Notes (Signed)
[facility] [address] [place] [address]    Clinic Day:  08/09/2022  Referring physician: Benita Stabile, MD  Patient Care Team: Benita Stabile, MD as PCP - General (Internal Medicine) Doreatha Massed, MD as Medical Oncologist (Medical Oncology) Therese Sarah, RN as Oncology Nurse Navigator (Medical Oncology)   ASSESSMENT & PLAN:   Assessment: 1.  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  No hemoptysis/weight loss. - Chest x-ray (04/10/2022): Mid right lung mass. - CT chest (04/13/2022): 9.2 x 5.7 x 6.6 cm right upper lobe mass with lobulations appearing to extend across the minor fissure into the right middle lobe and right lower lobe.  Satellite lesion in the right upper lobe measures 1.6 x 1.4 x 1.4 cm.  No findings of chest wall invasion.  Borderline prominent lower right paratracheal lymph node 0.9 cm.  Old granulomatous disease.  Small left adrenal adenoma. - Has right posterior chest wall pain for the past 6 months, dull type.  He is not requiring any pain medication. - Brain MRI (04/19/2022): No metastatic disease. - PET scan (04/19/2022): 9 x 5.7 cm right upper lobe mass with SUV 11.8.  1.3 x 0.9 cm satellite nodule in the right upper lobe with SUV 3.8.  No adenopathy or distant metastatic disease. - Bronchoscopy and biopsy by Dr. Delton Coombes - Pathology (04/23/2022): Both right upper lobe lung nodule FNA consistent with poorly differentiated carcinoma.  IHC diffusely positive for TTF-1, Napsin, synaptophysin and CK7.  CD56 negative.  P63 and CK5/6 negative.  Given Napsin positivity, favor adenocarcinoma.  There is also diffuse synaptophysin positivity consistent with neuroendocrine differentiation.  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 - Discussed with Dr. Dorris Fetch.  Not a surgical candidate as he requires pneumonectomy and has  suboptimal DLCO. - Chemoradiation with carboplatin and paclitaxel from 05/22/2022 through 07/06/2022 - Consolidation durvalumab started on 07/27/2022   2.  Social/family history: - He is seen with his 2 sisters today.  Lives by himself and is independent of ADLs and IADLs.  He retired after working at [employer] for the last 34 years.  No asbestos exposure.  No other chemicals.  Smokes more than 1 pack/day for the last 50 years (started at age 54) quit smoking for 5 years before starting back again. - Brother died of lung cancer.  Father had bladder cancer.  Maternal uncle had cancer.    Plan: 1.  Stage IIIa adenocarcinoma of the right lung: - CT chest on 07/19/2022: Partial response. - He has tolerated first round of immunotherapy very well.  No immunotherapy related side effects. - He is smoking 1 pack of cigarettes per day.  We talked about smoking cessation with Chantix.  We discussed side effects in detail.  He is agreeable.  Will send Chantix starter pack. - Reviewed labs today: Normal LFTs and creatinine.  CBC grossly normal.  Proceed with Imfinzi every 2 weeks.  RTC 8 weeks for follow-up with repeat CT chest with contrast.   2.  Anxiety/depression: - Continue clonazepam 1 mg twice daily as needed.   3.  COPD: - Continue Tessalon Perles 200 mg twice daily.  Cough is well-controlled.    Orders Placed This Encounter  Procedures   CT Chest W Contrast    Standing Status:   Future    Standing Expiration Date:   08/09/2023  Order Specific Question:   If indicated for the ordered procedure, I authorize the administration of contrast media per Radiology protocol    Answer:   Yes    Order Specific Question:   Does the patient have a contrast media/X-ray dye allergy?    Answer:   No    Order Specific Question:   Preferred imaging location?    Answer:   [facility]   CBC with Differential    Standing Status:   Future    Standing Expiration Date:   11/17/2023   Comprehensive  metabolic panel    Standing Status:   Future    Standing Expiration Date:   11/17/2023   T4    Standing Status:   Future    Standing Expiration Date:   11/17/2023   TSH    Standing Status:   Future    Standing Expiration Date:   11/17/2023      [name],acting as a scribe for Doreatha Massed, MD.,have documented all relevant documentation on the behalf of Doreatha Massed, MD,as directed by  Doreatha Massed, MD while in the presence of Doreatha Massed, MD.  I, Doreatha Massed MD, have reviewed the above documentation for accuracy and completeness, and I agree with the above.     Doreatha Massed, MD   8/1/20246:20 PM  CHIEF COMPLAINT:   Diagnosis: RUL lung adenocarcinoma    Cancer Staging  Non-small cell cancer of right lung [facility]) Staging form: Lung, AJCC 8th Edition - Clinical stage from 05/01/2022: Stage IIIA (cT4, cN0, cM0) - Unsigned    Prior Therapy: none  Current Therapy:  concurrent chemoradiation with carboplatin + paclitaxel    HISTORY OF PRESENT ILLNESS:   Oncology History  Non-small cell cancer of right lung (HCC)  05/01/2022 Initial Diagnosis   Non-small cell cancer of right lung (HCC)   05/22/2022 - 07/05/2022 Chemotherapy   Patient is on Treatment Plan : LUNG Carboplatin + Paclitaxel + XRT q7d     07/27/2022 -  Chemotherapy   Patient is on Treatment Plan : LUNG Durvalumab (10) q14d        INTERVAL HISTORY:   Carlos [name] is a 67 y.o. male presenting to clinic today for follow up of RUL lung adenocarcinoma. He was last seen by me on 07/26/22.  Today, he states that he is doing well overall. His appetite level is at 80%. His energy level is at 50%.  PAST MEDICAL HISTORY:   Past Medical History: Past Medical History:  Diagnosis Date   Anxiety    Cancer (HCC)    Skin cancer left shoulder   Chronic low back pain    Complication of anesthesia    Dermatofibrosarcoma protubera of right shoulder    Dyspnea    GERD  (gastroesophageal reflux disease)    Hyperlipidemia    Lung cancer (HCC) 04/23/2022   PONV (postoperative nausea and vomiting)    Port-A-Cath in place 05/17/2022   Prostate cancer [facility])     Surgical History: Past Surgical History:  Procedure Laterality Date   APPENDECTOMY     APPLICATION OF A-CELL OF EXTREMITY Right 07/09/2012   Procedure: PLACEMENT OF A-CELL TO UPPER EXTREMITY/PLACEMENT OF VAC;  Surgeon: Wayland Denis, DO;  Location: [facility];  Service: Plastics;  Laterality: Right;   BRONCHIAL BRUSHINGS  04/23/2022   Procedure: BRONCHIAL BRUSHINGS;  Surgeon: Leslye Peer, MD;  Location: [facility] ENDOSCOPY;  Service: Pulmonary;;   BRONCHIAL NEEDLE ASPIRATION BIOPSY  04/23/2022   Procedure: BRONCHIAL NEEDLE ASPIRATION  BIOPSIES;  Surgeon: Leslye Peer, MD;  Location: [facility] ENDOSCOPY;  Service: Pulmonary;;   COLONOSCOPY WITH PROPOFOL N/A 09/30/2015   Procedure: COLONOSCOPY WITH PROPOFOL;  Surgeon: Malissa Hippo, MD;  Location: [facility];  Service: Endoscopy;  Laterality: N/A;  830   EXCISION DERMATOFIBROCARCOMA PROTUBERAN RIGHT SHOULDER  2 - 3 WKS AGO   HEMOSTASIS CONTROL  04/23/2022   Procedure: HEMOSTASIS CONTROL;  Surgeon: Leslye Peer, MD;  Location: [facility] ENDOSCOPY;  Service: Pulmonary;;   INGUINAL HERNIA REPAIR Left 1999   IR IMAGING GUIDED PORT INSERTION  04/30/2022   KNEE ARTHROSCOPY Left 2012   POLYPECTOMY  09/30/2015   Procedure: POLYPECTOMY;  Surgeon: Malissa Hippo, MD;  Location: [facility];  Service: Endoscopy;;  sigmoid   VIDEO BRONCHOSCOPY WITH ENDOBRONCHIAL ULTRASOUND Bilateral 04/23/2022   Procedure: VIDEO BRONCHOSCOPY WITH ENDOBRONCHIAL ULTRASOUND;  Surgeon: Leslye Peer, MD;  Location: [facility] ENDOSCOPY;  Service: Pulmonary;  Laterality: Bilateral;   VIDEO BRONCHOSCOPY WITH RADIAL ENDOBRONCHIAL ULTRASOUND  04/23/2022   Procedure: VIDEO BRONCHOSCOPY WITH RADIAL ENDOBRONCHIAL ULTRASOUND;  Surgeon: Leslye Peer, MD;  Location: [facility] ENDOSCOPY;  Service:  Pulmonary;;    Social History: Social History   Socioeconomic History   Marital status: Divorced    Spouse name: Burna Mortimer   Number of children: 1   Years of education: 10th   Highest education level: Not on file  Occupational History    Employer: [employer]    Comment: [employer]  Tobacco Use   Smoking status: Every Day    Current packs/day: 1.00    Average packs/day: 1 pack/day for 40.0 years (40.0 ttl pk-yrs)    Types: Cigarettes   Smokeless tobacco: Never  Vaping Use   Vaping status: Never Used  Substance and Sexual Activity   Alcohol use: Yes    Comment: 2-3 cans beer/day   Drug use: No   Sexual activity: Not on file  Other Topics Concern   Not on file  Social History Narrative   Patient lives at home with his dog. 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 Patient has 10 grade education.Patient is right-handed.Caffeine Use: 1 cup of coffee; 3-4 sodas daily   Social Determinants of Health   Financial Resource Strain: High Risk (05/10/2022)   Overall Financial Resource Strain (CARDIA)    Difficulty of Paying Living Expenses: Very hard  Food Insecurity: Food Insecurity Present (05/04/2022)   Hunger Vital Sign    Worried About Running Out of Food in the Last Year: Sometimes true    Ran Out of Food in the Last Year: Sometimes true  Transportation Needs: No Transportation Needs (05/04/2022)   PRAPARE - Administrator, Civil Service (Medical): No    Lack of Transportation (Non-Medical): No  Physical Activity: Not on file  Stress: Not on file  Social Connections: Not on file  Intimate Partner Violence: Not At Risk (05/04/2022)   Humiliation, Afraid, Rape, and Kick questionnaire    Fear of Current or Ex-Partner: No    Emotionally Abused: No    Physically Abused: No    Sexually Abused: No    Family History: Family History  Problem Relation Age of Onset   Dementia Mother    Cancer Father     Current  Medications:  Current Outpatient Medications:    acetaminophen (TYLENOL) 500 MG tablet, Take 1,000 mg by mouth every 6 (six) hours as needed (pain.)., Disp: , Rfl:    albuterol (VENTOLIN HFA) 108 (  90 Base) MCG/ACT inhaler, Inhale 2 puffs into the lungs every 6 (six) hours as needed for wheezing or shortness of breath., Disp: 8 g, Rfl: 6   aspirin EC 81 MG tablet, Take 81 mg by mouth at bedtime. Swallow whole., Disp: , Rfl:    benzonatate (TESSALON) 200 MG capsule, Take 1 capsule (200 mg total) by mouth 3 (three) times daily as needed for cough., Disp: 60 capsule, Rfl: 1   CARBOPLATIN IV, Inject into the vein once a week., Disp: , Rfl:    clonazePAM (KLONOPIN) 1 MG disintegrating tablet, Take one tablet by mouth every morning and two tablets by mouth at bedtime, Disp: 90 tablet, Rfl: 2   meloxicam (MOBIC) 15 MG tablet, Take 15 mg by mouth in the morning., Disp: , Rfl:    PACLitaxel (TAXOL IV), Inject into the vein once a week., Disp: , Rfl:    pantoprazole (PROTONIX) 40 MG tablet, Take 40 mg by mouth daily., Disp: , Rfl:    simvastatin (ZOCOR) 20 MG tablet, Take 20 mg by mouth at bedtime., Disp: , Rfl: 2   sucralfate (CARAFATE) 1 g tablet, Take 1 tablet (1 g total) by mouth 4 (four) times daily. Dissolve each tablet in 15 cc water before use., Disp: 120 tablet, Rfl: 2   tamsulosin (FLOMAX) 0.4 MG CAPS capsule, Take 1 capsule (0.4 mg total) by mouth daily. (Patient taking differently: Take 0.4 mg by mouth at bedtime.), Disp: 30 capsule, Rfl: 11   varenicline (CHANTIX CONTINUING MONTH PAK) 1 MG tablet, Take 1 tablet (1 mg total) by mouth 2 (two) times daily., Disp: 60 tablet, Rfl: 2   varenicline (CHANTIX) 0.5 MG tablet, Take 1 tablet (0.5 mg total) by mouth 2 (two) times daily. Take 0.5 mg daily x 3 days; then 0.5 mg twice a day x 4 days, Disp: 11 tablet, Rfl: 0   Allergies: Allergies  Allergen Reactions   Ciprofloxacin Swelling and Other (See Comments)    Swelling in hands, numbness in arms and  joint pain   Gabapentin Other (See Comments)    Double vision    REVIEW OF SYSTEMS:   Review of Systems  Constitutional:  Negative for chills, fatigue and fever.  HENT:   Negative for lump/mass, mouth sores, nosebleeds, sore throat and trouble swallowing.   Eyes:  Negative for eye problems.  Respiratory:  Positive for cough and shortness of breath.   Cardiovascular:  Negative for chest pain, leg swelling and palpitations.  Gastrointestinal:  Negative for abdominal pain, constipation, diarrhea, nausea and vomiting.  Genitourinary:  Negative for bladder incontinence, difficulty urinating, dysuria, frequency, hematuria and nocturia.   Musculoskeletal:  Negative for arthralgias, back pain, flank pain, myalgias and neck pain.  Skin:  Negative for itching and rash.  Neurological:  Positive for dizziness. Negative for headaches and numbness.  Hematological:  Does not bruise/bleed easily.  Psychiatric/Behavioral:  Positive for sleep disturbance. Negative for depression and suicidal ideas. The patient is not nervous/anxious.   All other systems reviewed and are negative.    VITALS:   Weight 184 lb 3.2 oz (83.6 kg).  Wt Readings from Last 3 Encounters:  08/09/22 184 lb 3.2 oz (83.6 kg)  07/26/22 180 lb 12.8 oz (82 kg)  07/05/22 181 lb 9.6 oz (82.4 kg)    Body mass index is 29.73 kg/m.  Performance status (ECOG): 1 - Symptomatic but completely ambulatory  PHYSICAL EXAM:   Physical Exam Vitals and nursing note reviewed. Exam conducted with a chaperone present.  Constitutional:  Appearance: Normal appearance.  Cardiovascular:     Rate and Rhythm: Normal rate and regular rhythm.     Pulses: Normal pulses.     Heart sounds: Normal heart sounds.  Pulmonary:     Effort: Pulmonary effort is normal.     Breath sounds: Normal breath sounds.  Abdominal:     Palpations: Abdomen is soft. There is no hepatomegaly, splenomegaly or mass.     Tenderness: There is no abdominal tenderness.   Musculoskeletal:     Right lower leg: No edema.     Left lower leg: No edema.  Lymphadenopathy:     Cervical: No cervical adenopathy.     Right cervical: No superficial, deep or posterior cervical adenopathy.    Left cervical: No superficial, deep or posterior cervical adenopathy.     Upper Body:     Right upper body: No supraclavicular or axillary adenopathy.     Left upper body: No supraclavicular or axillary adenopathy.  Neurological:     General: No focal deficit present.     Mental Status: He is alert and oriented to person, place, and time.  Psychiatric:        Mood and Affect: Mood normal.        Behavior: Behavior normal.     LABS:      Latest Ref Rng & Units 08/09/2022    8:17 AM 07/26/2022    8:44 AM 07/05/2022    9:49 AM  CBC  WBC 4.0 - 10.5 K/uL 9.4  7.5  4.5   Hemoglobin 13.0 - 17.0 g/dL 27.2  53.6  64.4   Hematocrit 39.0 - 52.0 % 38.8  38.0  38.5   Platelets 150 - 400 K/uL 327  233  279       Latest Ref Rng & Units 08/09/2022    8:17 AM 07/26/2022    8:44 AM 07/05/2022    9:49 AM  CMP  Glucose 70 - 99 mg/dL 034  742  595   BUN 8 - 23 mg/dL 8  10  10    Creatinine 0.61 - 1.24 mg/dL 6.38  7.56  4.33   Sodium 135 - 145 mmol/L 131  131  132   Potassium 3.5 - 5.1 mmol/L 4.3  3.5  3.5   Chloride 98 - 111 mmol/L 98  101  101   CO2 22 - 32 mmol/L 23  22  22    Calcium 8.9 - 10.3 mg/dL 9.0  8.9  9.0   Total Protein 6.5 - 8.1 g/dL 7.3  7.1  7.2   Total Bilirubin 0.3 - 1.2 mg/dL 0.5  0.8  0.6   Alkaline Phos 38 - 126 U/L 63  67  53   AST 15 - 41 U/L 32  21  20   ALT 0 - 44 U/L 34  20  23      No results found for: "CEA1", "CEA" / No results found for: "CEA1", "CEA" Lab Results  Component Value Date   PSA1 4.7 (H) 04/12/2022   No results found for: "IRJ188" No results found for: "CAN125"  No results found for: "TOTALPROTELP", "ALBUMINELP", "A1GS", "A2GS", "BETS", "BETA2SER", "GAMS", "MSPIKE", "SPEI" No results found for: "TIBC", "FERRITIN", "IRONPCTSAT" No  results found for: "LDH"   STUDIES:   CT Chest W Contrast  Result Date: 07/20/2022 CLINICAL DATA:  67 year old male with history of non-small cell lung cancer status post chemotherapy and radiation therapy. Follow-up study. * Tracking Code: BO * EXAM: CT CHEST WITH CONTRAST  TECHNIQUE: Multidetector CT imaging of the chest was performed during intravenous contrast administration. RADIATION DOSE REDUCTION: This exam was performed according to the departmental dose-optimization program which includes automated exposure control, adjustment of the mA and/or kV according to patient size and/or use of iterative reconstruction technique. CONTRAST:  75mL OMNIPAQUE IOHEXOL 300 MG/ML  SOLN COMPARISON:  Chest CTA 04/29/2022.  PET-CT 04/19/2022. FINDINGS: Cardiovascular: Heart size is normal. There is no significant pericardial fluid, thickening or pericardial calcification. There is aortic atherosclerosis, as well as atherosclerosis of the great vessels of the mediastinum and the coronary arteries, including calcified atherosclerotic plaque in the left circumflex and right coronary arteries. Left internal jugular single-lumen Port-A-Cath with tip terminating at the right atrium. Mediastinum/Nodes: No pathologically enlarged mediastinal or hilar lymph nodes. Esophagus is unremarkable in appearance. No axillary lymphadenopathy. Lungs/Pleura: Previously noted right upper lobe mass (axial image 72 of series 2 and coronal image 77 of series 5) currently measures 6.5 x 4.3 x 3.7 cm and is heterogeneous in appearance with probable areas of necrosis most evident laterally. Satellite nodule in the right upper lobe (axial image 50 of series 4 and coronal image 74 of series 5) has an irregular macrolobulated and spiculated margin with internal air bronchograms, currently measuring 1.4 x 1.8 x 1.4 cm. Ground-glass attenuation, septal thickening and mild architectural distortion are noted in the surrounding lung parenchyma, most  evident in the right upper lobe, likely to reflect postobstructive changes, and/or evolving post treatment changes. A few scattered calcified granulomas are noted. Other small noncalcified pulmonary nodules are also noted in the lungs bilaterally measuring 4 mm or less in size, largest of which is in the left lower lobe (axial image 122 of series 4), similar in retrospect compared to prior examinations. No confluent consolidative airspace disease. No pleural effusions. Upper Abdomen: Aortic atherosclerosis. Diffuse low attenuation throughout the visualized hepatic parenchyma, indicative of a background of hepatic steatosis. Musculoskeletal: There are no aggressive appearing lytic or blastic lesions noted in the visualized portions of the skeleton. IMPRESSION: 1. Slight decreased size of large right upper lobe mass, with similar appearance of aggressive appearing satellite nodule in the right upper lobe, as detailed above. Continued attention on follow-up studies is recommended. 2. No mediastinal or hilar lymphadenopathy. 3. Multiple small pulmonary nodules noted elsewhere in the lungs measuring 4 mm or less in size, stable and nonspecific. Continued attention on follow-up imaging is recommended. 4. Aortic atherosclerosis, in addition to two-vessel coronary artery disease. Please note that although the presence of coronary artery calcium documents the presence of coronary artery disease, the severity of this disease and any potential stenosis cannot be assessed on this non-gated CT examination. Assessment for potential risk factor modification, dietary therapy or pharmacologic therapy may be warranted, if clinically indicated. 5. Hepatic steatosis. Aortic Atherosclerosis (ICD10-I70.0). Electronically Signed   By: Trudie Reed M.D.   On: 07/20/2022 08:11

## 2022-08-09 ENCOUNTER — Inpatient Hospital Stay: Payer: Medicare Other | Admitting: Hematology

## 2022-08-09 ENCOUNTER — Inpatient Hospital Stay: Payer: Medicare Other

## 2022-08-09 ENCOUNTER — Other Ambulatory Visit: Payer: Self-pay

## 2022-08-09 ENCOUNTER — Inpatient Hospital Stay: Payer: Medicare Other | Attending: Hematology

## 2022-08-09 VITALS — BP 151/84 | HR 88 | Temp 98.0°F | Resp 20

## 2022-08-09 VITALS — Wt 184.2 lb

## 2022-08-09 DIAGNOSIS — C3491 Malignant neoplasm of unspecified part of right bronchus or lung: Secondary | ICD-10-CM | POA: Diagnosis not present

## 2022-08-09 DIAGNOSIS — Z8546 Personal history of malignant neoplasm of prostate: Secondary | ICD-10-CM | POA: Insufficient documentation

## 2022-08-09 DIAGNOSIS — Z923 Personal history of irradiation: Secondary | ICD-10-CM | POA: Diagnosis not present

## 2022-08-09 DIAGNOSIS — Z9221 Personal history of antineoplastic chemotherapy: Secondary | ICD-10-CM | POA: Insufficient documentation

## 2022-08-09 DIAGNOSIS — C3411 Malignant neoplasm of upper lobe, right bronchus or lung: Secondary | ICD-10-CM | POA: Insufficient documentation

## 2022-08-09 DIAGNOSIS — F17210 Nicotine dependence, cigarettes, uncomplicated: Secondary | ICD-10-CM | POA: Insufficient documentation

## 2022-08-09 DIAGNOSIS — Z79899 Other long term (current) drug therapy: Secondary | ICD-10-CM | POA: Insufficient documentation

## 2022-08-09 DIAGNOSIS — Z95828 Presence of other vascular implants and grafts: Secondary | ICD-10-CM

## 2022-08-09 DIAGNOSIS — Z5111 Encounter for antineoplastic chemotherapy: Secondary | ICD-10-CM | POA: Diagnosis not present

## 2022-08-09 DIAGNOSIS — Z801 Family history of malignant neoplasm of trachea, bronchus and lung: Secondary | ICD-10-CM | POA: Diagnosis not present

## 2022-08-09 DIAGNOSIS — Z85828 Personal history of other malignant neoplasm of skin: Secondary | ICD-10-CM | POA: Diagnosis not present

## 2022-08-09 DIAGNOSIS — D3502 Benign neoplasm of left adrenal gland: Secondary | ICD-10-CM | POA: Insufficient documentation

## 2022-08-09 DIAGNOSIS — R918 Other nonspecific abnormal finding of lung field: Secondary | ICD-10-CM

## 2022-08-09 LAB — CBC WITH DIFFERENTIAL/PLATELET
Abs Immature Granulocytes: 0.08 10*3/uL — ABNORMAL HIGH (ref 0.00–0.07)
Basophils Absolute: 0.1 10*3/uL (ref 0.0–0.1)
Basophils Relative: 1 %
Eosinophils Absolute: 0.3 10*3/uL (ref 0.0–0.5)
Eosinophils Relative: 3 %
HCT: 38.8 % — ABNORMAL LOW (ref 39.0–52.0)
Hemoglobin: 13.4 g/dL (ref 13.0–17.0)
Immature Granulocytes: 1 %
Lymphocytes Relative: 5 %
Lymphs Abs: 0.5 10*3/uL — ABNORMAL LOW (ref 0.7–4.0)
MCH: 32.2 pg (ref 26.0–34.0)
MCHC: 34.5 g/dL (ref 30.0–36.0)
MCV: 93.3 fL (ref 80.0–100.0)
Monocytes Absolute: 0.8 10*3/uL (ref 0.1–1.0)
Monocytes Relative: 9 %
Neutro Abs: 7.6 10*3/uL (ref 1.7–7.7)
Neutrophils Relative %: 81 %
Platelets: 327 10*3/uL (ref 150–400)
RBC: 4.16 MIL/uL — ABNORMAL LOW (ref 4.22–5.81)
RDW: 16.1 % — ABNORMAL HIGH (ref 11.5–15.5)
WBC: 9.4 10*3/uL (ref 4.0–10.5)
nRBC: 0 % (ref 0.0–0.2)

## 2022-08-09 LAB — COMPREHENSIVE METABOLIC PANEL
ALT: 34 U/L (ref 0–44)
AST: 32 U/L (ref 15–41)
Albumin: 4 g/dL (ref 3.5–5.0)
Alkaline Phosphatase: 63 U/L (ref 38–126)
Anion gap: 10 (ref 5–15)
BUN: 8 mg/dL (ref 8–23)
CO2: 23 mmol/L (ref 22–32)
Calcium: 9 mg/dL (ref 8.9–10.3)
Chloride: 98 mmol/L (ref 98–111)
Creatinine, Ser: 0.84 mg/dL (ref 0.61–1.24)
GFR, Estimated: >60 mL/min (ref >60)
Glucose, Bld: 107 mg/dL — ABNORMAL HIGH (ref 70–99)
Potassium: 4.3 mmol/L (ref 3.5–5.1)
Sodium: 131 mmol/L — ABNORMAL LOW (ref 135–145)
Total Bilirubin: 0.5 mg/dL (ref 0.3–1.2)
Total Protein: 7.3 g/dL (ref 6.5–8.1)

## 2022-08-09 LAB — MAGNESIUM: Magnesium: 1.7 mg/dL (ref 1.7–2.4)

## 2022-08-09 MED ORDER — SODIUM CHLORIDE 0.9% FLUSH
10.0000 mL | INTRAVENOUS | Status: DC | PRN
  Administered 2022-08-09: 10 mL

## 2022-08-09 MED ORDER — VARENICLINE TARTRATE 1 MG PO TABS: 1.0000 mg | ORAL_TABLET | Freq: Two times a day (BID) | ORAL | 2 refills | Status: DC

## 2022-08-09 MED ORDER — SODIUM CHLORIDE 0.9 % IV SOLN
10.0000 mg/kg | Freq: Once | INTRAVENOUS | Status: AC
  Administered 2022-08-09: 860 mg via INTRAVENOUS
  Filled 2022-08-09: qty 10

## 2022-08-09 MED ORDER — SODIUM CHLORIDE 0.9 % IV SOLN: Freq: Once | INTRAVENOUS | Status: AC

## 2022-08-09 MED ORDER — VARENICLINE TARTRATE 0.5 MG PO TABS: 0.5000 mg | ORAL_TABLET | Freq: Two times a day (BID) | ORAL | 0 refills | Status: DC

## 2022-08-09 MED ORDER — HEPARIN SOD (PORK) LOCK FLUSH 100 UNIT/ML IV SOLN
500.0000 [IU] | Freq: Once | INTRAVENOUS | Status: AC | PRN
  Administered 2022-08-09: 500 [IU]

## 2022-08-09 NOTE — Patient Instructions (Signed)
[facility] [place]  Discharge Instructions: Thank you for choosing [facility] to provide your oncology and hematology care.  If you have a lab appointment with the Cancer Center - please note that after April 8th, 2024, all labs will be drawn in the cancer center.  You do not have to check in or register with the main entrance as you have in the past but will complete your check-in in the cancer center.  Wear comfortable clothing and clothing appropriate for easy access to any Portacath or PICC line.   We strive to give you quality time with your provider. You may need to reschedule your appointment if you arrive late (15 or more minutes).  Arriving late affects you and other patients whose appointments are after yours.  Also, if you miss three or more appointments without notifying the office, you may be dismissed from the clinic at the provider's discretion.      For prescription refill requests, have your pharmacy contact our office and allow 72 hours for refills to be completed.    Today you received the following chemotherapy and/or immunotherapy agents Imfinzi      To help prevent nausea and vomiting after your treatment, we encourage you to take your nausea medication as directed.  BELOW ARE SYMPTOMS THAT SHOULD BE REPORTED IMMEDIATELY: *FEVER GREATER THAN 100.4 F (38 C) OR HIGHER *CHILLS OR SWEATING *NAUSEA AND VOMITING THAT IS NOT CONTROLLED WITH YOUR NAUSEA MEDICATION *UNUSUAL SHORTNESS OF BREATH *UNUSUAL BRUISING OR BLEEDING *URINARY PROBLEMS (pain or burning when urinating, or frequent urination) *BOWEL PROBLEMS (unusual diarrhea, constipation, pain near the anus) TENDERNESS IN MOUTH AND THROAT WITH OR WITHOUT PRESENCE OF ULCERS (sore throat, sores in mouth, or a toothache) UNUSUAL RASH, SWELLING OR PAIN  UNUSUAL VAGINAL DISCHARGE OR ITCHING   Items with * indicate a potential emergency and should be followed up as soon as possible or go to the  Emergency Department if any problems should occur.  Please show the CHEMOTHERAPY ALERT CARD or IMMUNOTHERAPY ALERT CARD at check-in to the Emergency Department and triage nurse.  Should you have questions after your visit or need to cancel or reschedule your appointment, please contact [facility] [place] [phone]  and follow the prompts.  Office hours are 8:00 a.m. to 4:30 p.m. Monday - Friday. Please note that voicemails left after 4:00 p.m. may not be returned until the following business day.  We are closed weekends and major holidays. You have access to a nurse at all times for urgent questions. Please call the main number to the clinic [phone] and follow the prompts.  For any non-urgent questions, you may also contact your provider using MyChart. We now offer e-Visits for anyone 18 and older to request care online for non-urgent symptoms. For details visit [email or web address].   Also download the MyChart app! Go to the app store, search "MyChart", open the app, select [place], and log in with your MyChart username and password.   

## 2022-08-09 NOTE — Progress Notes (Signed)
Patient has been examined by Dr. Katragadda. Vital signs and labs have been reviewed by MD - ANC, Creatinine, LFTs, hemoglobin, and platelets are within treatment parameters per M.D. - pt may proceed with treatment.  Primary RN and pharmacy notified.  

## 2022-08-09 NOTE — Patient Instructions (Signed)

## 2022-08-09 NOTE — Progress Notes (Signed)
Patient presents today for Imfinzi infusion per providers order.  Vital signs and labs reviewed by MD.  Message received form Chapman Moss RN/Dr. Ellin Saba patient okay for treatment.  Treatment given today per MD orders.  Stable during infusion without adverse affects.  Vital signs stable.  No complaints at this time.  Discharge from clinic ambulatory in stable condition.  Alert and oriented X 3.  Follow up with [facility] as scheduled.

## 2022-08-11 ENCOUNTER — Other Ambulatory Visit: Payer: Self-pay

## 2022-08-13 ENCOUNTER — Encounter: Payer: Self-pay | Admitting: Hematology

## 2022-08-13 ENCOUNTER — Ambulatory Visit: Payer: Medicare Other | Admitting: Urology

## 2022-08-20 ENCOUNTER — Other Ambulatory Visit: Payer: Self-pay

## 2022-08-20 MED ORDER — VARENICLINE TARTRATE 1 MG PO TABS: 1.0000 mg | ORAL_TABLET | Freq: Two times a day (BID) | ORAL | 2 refills | Status: DC

## 2022-08-20 MED ORDER — VARENICLINE TARTRATE 0.5 MG PO TABS: 0.5000 mg | ORAL_TABLET | Freq: Two times a day (BID) | ORAL | 0 refills | Status: DC

## 2022-08-23 ENCOUNTER — Inpatient Hospital Stay: Payer: Medicare Other

## 2022-08-23 ENCOUNTER — Inpatient Hospital Stay: Payer: Medicare Other | Admitting: Hematology

## 2022-08-23 ENCOUNTER — Encounter (INDEPENDENT_AMBULATORY_CARE_PROVIDER_SITE_OTHER): Payer: Self-pay | Admitting: *Deleted

## 2022-08-23 VITALS — BP 135/81 | HR 80 | Temp 97.4°F

## 2022-08-23 DIAGNOSIS — Z9221 Personal history of antineoplastic chemotherapy: Secondary | ICD-10-CM | POA: Diagnosis not present

## 2022-08-23 DIAGNOSIS — Z95828 Presence of other vascular implants and grafts: Secondary | ICD-10-CM

## 2022-08-23 DIAGNOSIS — D3502 Benign neoplasm of left adrenal gland: Secondary | ICD-10-CM | POA: Diagnosis not present

## 2022-08-23 DIAGNOSIS — C3411 Malignant neoplasm of upper lobe, right bronchus or lung: Secondary | ICD-10-CM | POA: Diagnosis not present

## 2022-08-23 DIAGNOSIS — R918 Other nonspecific abnormal finding of lung field: Secondary | ICD-10-CM

## 2022-08-23 DIAGNOSIS — F17210 Nicotine dependence, cigarettes, uncomplicated: Secondary | ICD-10-CM | POA: Diagnosis not present

## 2022-08-23 DIAGNOSIS — C3491 Malignant neoplasm of unspecified part of right bronchus or lung: Secondary | ICD-10-CM

## 2022-08-23 DIAGNOSIS — Z801 Family history of malignant neoplasm of trachea, bronchus and lung: Secondary | ICD-10-CM | POA: Diagnosis not present

## 2022-08-23 DIAGNOSIS — Z5111 Encounter for antineoplastic chemotherapy: Secondary | ICD-10-CM | POA: Diagnosis not present

## 2022-08-23 DIAGNOSIS — Z85828 Personal history of other malignant neoplasm of skin: Secondary | ICD-10-CM | POA: Diagnosis not present

## 2022-08-23 DIAGNOSIS — Z923 Personal history of irradiation: Secondary | ICD-10-CM | POA: Diagnosis not present

## 2022-08-23 DIAGNOSIS — Z79899 Other long term (current) drug therapy: Secondary | ICD-10-CM | POA: Diagnosis not present

## 2022-08-23 LAB — COMPREHENSIVE METABOLIC PANEL
ALT: 19 U/L (ref 0–44)
AST: 20 U/L (ref 15–41)
Albumin: 3.9 g/dL (ref 3.5–5.0)
Alkaline Phosphatase: 62 U/L (ref 38–126)
Anion gap: 10 (ref 5–15)
BUN: 8 mg/dL (ref 8–23)
CO2: 23 mmol/L (ref 22–32)
Calcium: 9.1 mg/dL (ref 8.9–10.3)
Chloride: 99 mmol/L (ref 98–111)
Creatinine, Ser: 0.83 mg/dL (ref 0.61–1.24)
GFR, Estimated: >60 mL/min (ref >60)
Glucose, Bld: 98 mg/dL (ref 70–99)
Potassium: 3.7 mmol/L (ref 3.5–5.1)
Sodium: 132 mmol/L — ABNORMAL LOW (ref 135–145)
Total Bilirubin: 0.6 mg/dL (ref 0.3–1.2)
Total Protein: 7.1 g/dL (ref 6.5–8.1)

## 2022-08-23 LAB — CBC WITH DIFFERENTIAL/PLATELET
Abs Immature Granulocytes: 0.02 10*3/uL (ref 0.00–0.07)
Basophils Absolute: 0 10*3/uL (ref 0.0–0.1)
Basophils Relative: 1 %
Eosinophils Absolute: 0.2 10*3/uL (ref 0.0–0.5)
Eosinophils Relative: 4 %
HCT: 39.8 % (ref 39.0–52.0)
Hemoglobin: 13.5 g/dL (ref 13.0–17.0)
Immature Granulocytes: 0 %
Lymphocytes Relative: 7 %
Lymphs Abs: 0.4 10*3/uL — ABNORMAL LOW (ref 0.7–4.0)
MCH: 32.8 pg (ref 26.0–34.0)
MCHC: 33.9 g/dL (ref 30.0–36.0)
MCV: 96.8 fL (ref 80.0–100.0)
Monocytes Absolute: 0.8 10*3/uL (ref 0.1–1.0)
Monocytes Relative: 11 %
Neutro Abs: 5.2 10*3/uL (ref 1.7–7.7)
Neutrophils Relative %: 77 %
Platelets: 250 10*3/uL (ref 150–400)
RBC: 4.11 MIL/uL — ABNORMAL LOW (ref 4.22–5.81)
RDW: 15.7 % — ABNORMAL HIGH (ref 11.5–15.5)
WBC: 6.6 10*3/uL (ref 4.0–10.5)
nRBC: 0 % (ref 0.0–0.2)

## 2022-08-23 LAB — MAGNESIUM: Magnesium: 1.8 mg/dL (ref 1.7–2.4)

## 2022-08-23 LAB — TSH: TSH: 1.278 u[IU]/mL (ref 0.350–4.500)

## 2022-08-23 MED ORDER — SODIUM CHLORIDE 0.9 % IV SOLN: Freq: Once | INTRAVENOUS | Status: AC

## 2022-08-23 MED ORDER — SODIUM CHLORIDE 0.9 % IV SOLN
10.0000 mg/kg | Freq: Once | INTRAVENOUS | Status: AC
  Administered 2022-08-23: 860 mg via INTRAVENOUS
  Filled 2022-08-23: qty 10

## 2022-08-23 MED ORDER — SODIUM CHLORIDE 0.9% FLUSH
10.0000 mL | INTRAVENOUS | Status: DC | PRN
  Administered 2022-08-23: 10 mL

## 2022-08-23 MED ORDER — HEPARIN SOD (PORK) LOCK FLUSH 100 UNIT/ML IV SOLN
500.0000 [IU] | Freq: Once | INTRAVENOUS | Status: AC | PRN
  Administered 2022-08-23: 500 [IU]

## 2022-08-23 NOTE — Patient Instructions (Signed)
[facility] [place]  Discharge Instructions: Thank you for choosing [facility] to provide your oncology and hematology care.  If you have a lab appointment with the Cancer Center - please note that after April 8th, 2024, all labs will be drawn in the cancer center.  You do not have to check in or register with the main entrance as you have in the past but will complete your check-in in the cancer center.  Wear comfortable clothing and clothing appropriate for easy access to any Portacath or PICC line.   We strive to give you quality time with your provider. You may need to reschedule your appointment if you arrive late (15 or more minutes).  Arriving late affects you and other patients whose appointments are after yours.  Also, if you miss three or more appointments without notifying the office, you may be dismissed from the clinic at the provider's discretion.      For prescription refill requests, have your pharmacy contact our office and allow 72 hours for refills to be completed.    Today you received the following chemotherapy and/or immunotherapy agents Imfinzi      To help prevent nausea and vomiting after your treatment, we encourage you to take your nausea medication as directed.  BELOW ARE SYMPTOMS THAT SHOULD BE REPORTED IMMEDIATELY: *FEVER GREATER THAN 100.4 F (38 C) OR HIGHER *CHILLS OR SWEATING *NAUSEA AND VOMITING THAT IS NOT CONTROLLED WITH YOUR NAUSEA MEDICATION *UNUSUAL SHORTNESS OF BREATH *UNUSUAL BRUISING OR BLEEDING *URINARY PROBLEMS (pain or burning when urinating, or frequent urination) *BOWEL PROBLEMS (unusual diarrhea, constipation, pain near the anus) TENDERNESS IN MOUTH AND THROAT WITH OR WITHOUT PRESENCE OF ULCERS (sore throat, sores in mouth, or a toothache) UNUSUAL RASH, SWELLING OR PAIN  UNUSUAL VAGINAL DISCHARGE OR ITCHING   Items with * indicate a potential emergency and should be followed up as soon as possible or go to the  Emergency Department if any problems should occur.  Please show the CHEMOTHERAPY ALERT CARD or IMMUNOTHERAPY ALERT CARD at check-in to the Emergency Department and triage nurse.  Should you have questions after your visit or need to cancel or reschedule your appointment, please contact [facility] [place] [phone]  and follow the prompts.  Office hours are 8:00 a.m. to 4:30 p.m. Monday - Friday. Please note that voicemails left after 4:00 p.m. may not be returned until the following business day.  We are closed weekends and major holidays. You have access to a nurse at all times for urgent questions. Please call the main number to the clinic [phone] and follow the prompts.  For any non-urgent questions, you may also contact your provider using MyChart. We now offer e-Visits for anyone 18 and older to request care online for non-urgent symptoms. For details visit [email or web address].   Also download the MyChart app! Go to the app store, search "MyChart", open the app, select [place], and log in with your MyChart username and password.   

## 2022-08-23 NOTE — Progress Notes (Signed)
Patient tolerated therapy with no complaints voiced.  Side effects with management reviewed with understanding verbalized.  Port site clean and dry with no bruising or swelling noted at site.  Good blood return noted before and after administration of therapy.  Band aid applied.  Patient left in satisfactory condition with VSS and no s/s of distress noted.  

## 2022-08-24 LAB — T4: T4, Total: 7.7 ug/dL (ref 4.5–12.0)

## 2022-09-06 ENCOUNTER — Inpatient Hospital Stay: Payer: Medicare Other

## 2022-09-06 ENCOUNTER — Inpatient Hospital Stay: Payer: Medicare Other | Admitting: Hematology

## 2022-09-06 VITALS — BP 152/71 | HR 83 | Temp 97.6°F | Resp 18

## 2022-09-06 DIAGNOSIS — Z95828 Presence of other vascular implants and grafts: Secondary | ICD-10-CM

## 2022-09-06 DIAGNOSIS — D3502 Benign neoplasm of left adrenal gland: Secondary | ICD-10-CM | POA: Diagnosis not present

## 2022-09-06 DIAGNOSIS — Z79899 Other long term (current) drug therapy: Secondary | ICD-10-CM | POA: Diagnosis not present

## 2022-09-06 DIAGNOSIS — Z5111 Encounter for antineoplastic chemotherapy: Secondary | ICD-10-CM | POA: Diagnosis not present

## 2022-09-06 DIAGNOSIS — F17210 Nicotine dependence, cigarettes, uncomplicated: Secondary | ICD-10-CM | POA: Diagnosis not present

## 2022-09-06 DIAGNOSIS — Z85828 Personal history of other malignant neoplasm of skin: Secondary | ICD-10-CM | POA: Diagnosis not present

## 2022-09-06 DIAGNOSIS — Z923 Personal history of irradiation: Secondary | ICD-10-CM | POA: Diagnosis not present

## 2022-09-06 DIAGNOSIS — Z801 Family history of malignant neoplasm of trachea, bronchus and lung: Secondary | ICD-10-CM | POA: Diagnosis not present

## 2022-09-06 DIAGNOSIS — C3491 Malignant neoplasm of unspecified part of right bronchus or lung: Secondary | ICD-10-CM

## 2022-09-06 DIAGNOSIS — C3411 Malignant neoplasm of upper lobe, right bronchus or lung: Secondary | ICD-10-CM | POA: Diagnosis not present

## 2022-09-06 DIAGNOSIS — R918 Other nonspecific abnormal finding of lung field: Secondary | ICD-10-CM

## 2022-09-06 DIAGNOSIS — Z9221 Personal history of antineoplastic chemotherapy: Secondary | ICD-10-CM | POA: Diagnosis not present

## 2022-09-06 DIAGNOSIS — R7989 Other specified abnormal findings of blood chemistry: Secondary | ICD-10-CM

## 2022-09-06 LAB — CBC WITH DIFFERENTIAL/PLATELET
Abs Immature Granulocytes: 0.04 10*3/uL (ref 0.00–0.07)
Basophils Absolute: 0.1 10*3/uL (ref 0.0–0.1)
Basophils Relative: 1 %
Eosinophils Absolute: 0.3 10*3/uL (ref 0.0–0.5)
Eosinophils Relative: 4 %
HCT: 37.4 % — ABNORMAL LOW (ref 39.0–52.0)
Hemoglobin: 13.4 g/dL (ref 13.0–17.0)
Immature Granulocytes: 1 %
Lymphocytes Relative: 6 %
Lymphs Abs: 0.5 10*3/uL — ABNORMAL LOW (ref 0.7–4.0)
MCH: 34.3 pg — ABNORMAL HIGH (ref 26.0–34.0)
MCHC: 35.8 g/dL (ref 30.0–36.0)
MCV: 95.7 fL (ref 80.0–100.0)
Monocytes Absolute: 0.8 10*3/uL (ref 0.1–1.0)
Monocytes Relative: 11 %
Neutro Abs: 5.6 10*3/uL (ref 1.7–7.7)
Neutrophils Relative %: 77 %
Platelets: 266 10*3/uL (ref 150–400)
RBC: 3.91 MIL/uL — ABNORMAL LOW (ref 4.22–5.81)
RDW: 14.8 % (ref 11.5–15.5)
WBC: 7.2 10*3/uL (ref 4.0–10.5)
nRBC: 0 % (ref 0.0–0.2)

## 2022-09-06 LAB — COMPREHENSIVE METABOLIC PANEL
ALT: 27 U/L (ref 0–44)
AST: 28 U/L (ref 15–41)
Albumin: 4 g/dL (ref 3.5–5.0)
Alkaline Phosphatase: 58 U/L (ref 38–126)
Anion gap: 9 (ref 5–15)
BUN: 8 mg/dL (ref 8–23)
CO2: 23 mmol/L (ref 22–32)
Calcium: 8.6 mg/dL — ABNORMAL LOW (ref 8.9–10.3)
Chloride: 95 mmol/L — ABNORMAL LOW (ref 98–111)
Creatinine, Ser: 0.78 mg/dL (ref 0.61–1.24)
GFR, Estimated: >60 mL/min (ref >60)
Glucose, Bld: 97 mg/dL (ref 70–99)
Potassium: 4.2 mmol/L (ref 3.5–5.1)
Sodium: 127 mmol/L — ABNORMAL LOW (ref 135–145)
Total Bilirubin: 0.9 mg/dL (ref 0.3–1.2)
Total Protein: 6.9 g/dL (ref 6.5–8.1)

## 2022-09-06 LAB — MAGNESIUM: Magnesium: 1.8 mg/dL (ref 1.7–2.4)

## 2022-09-06 MED ORDER — SODIUM CHLORIDE 0.9 % IV SOLN: INTRAVENOUS | Status: DC

## 2022-09-06 MED ORDER — HEPARIN SOD (PORK) LOCK FLUSH 100 UNIT/ML IV SOLN
500.0000 [IU] | Freq: Once | INTRAVENOUS | Status: AC | PRN
  Administered 2022-09-06: 500 [IU]

## 2022-09-06 MED ORDER — SODIUM CHLORIDE 0.9 % IV SOLN: Freq: Once | INTRAVENOUS | Status: AC

## 2022-09-06 MED ORDER — SODIUM CHLORIDE 0.9 % IV SOLN
10.0000 mg/kg | Freq: Once | INTRAVENOUS | Status: AC
  Administered 2022-09-06: 860 mg via INTRAVENOUS
  Filled 2022-09-06: qty 10

## 2022-09-06 MED ORDER — SODIUM CHLORIDE 0.9% FLUSH
10.0000 mL | INTRAVENOUS | Status: DC | PRN
  Administered 2022-09-06: 10 mL via INTRAVENOUS

## 2022-09-06 MED ORDER — SODIUM CHLORIDE 0.9% FLUSH
10.0000 mL | INTRAVENOUS | Status: DC | PRN
  Administered 2022-09-06: 10 mL

## 2022-09-06 NOTE — Patient Instructions (Signed)
[facility] [place]  Discharge Instructions: Thank you for choosing [facility] to provide your oncology and hematology care.  If you have a lab appointment with the Cancer Center - please note that after April 8th, 2024, all labs will be drawn in the cancer center.  You do not have to check in or register with the main entrance as you have in the past but will complete your check-in in the cancer center.  Wear comfortable clothing and clothing appropriate for easy access to any Portacath or PICC line.   We strive to give you quality time with your provider. You may need to reschedule your appointment if you arrive late (15 or more minutes).  Arriving late affects you and other patients whose appointments are after yours.  Also, if you miss three or more appointments without notifying the office, you may be dismissed from the clinic at the provider's discretion.      For prescription refill requests, have your pharmacy contact our office and allow 72 hours for refills to be completed.    Today you received the following chemotherapy and/or immunotherapy agents Imfinzi      To help prevent nausea and vomiting after your treatment, we encourage you to take your nausea medication as directed.  BELOW ARE SYMPTOMS THAT SHOULD BE REPORTED IMMEDIATELY: *FEVER GREATER THAN 100.4 F (38 C) OR HIGHER *CHILLS OR SWEATING *NAUSEA AND VOMITING THAT IS NOT CONTROLLED WITH YOUR NAUSEA MEDICATION *UNUSUAL SHORTNESS OF BREATH *UNUSUAL BRUISING OR BLEEDING *URINARY PROBLEMS (pain or burning when urinating, or frequent urination) *BOWEL PROBLEMS (unusual diarrhea, constipation, pain near the anus) TENDERNESS IN MOUTH AND THROAT WITH OR WITHOUT PRESENCE OF ULCERS (sore throat, sores in mouth, or a toothache) UNUSUAL RASH, SWELLING OR PAIN  UNUSUAL VAGINAL DISCHARGE OR ITCHING   Items with * indicate a potential emergency and should be followed up as soon as possible or go to the  Emergency Department if any problems should occur.  Please show the CHEMOTHERAPY ALERT CARD or IMMUNOTHERAPY ALERT CARD at check-in to the Emergency Department and triage nurse.  Should you have questions after your visit or need to cancel or reschedule your appointment, please contact [facility] [place] [phone]  and follow the prompts.  Office hours are 8:00 a.m. to 4:30 p.m. Monday - Friday. Please note that voicemails left after 4:00 p.m. may not be returned until the following business day.  We are closed weekends and major holidays. You have access to a nurse at all times for urgent questions. Please call the main number to the clinic [phone] and follow the prompts.  For any non-urgent questions, you may also contact your provider using MyChart. We now offer e-Visits for anyone 18 and older to request care online for non-urgent symptoms. For details visit [email or web address].   Also download the MyChart app! Go to the app store, search "MyChart", open the app, select [place], and log in with your MyChart username and password.   

## 2022-09-06 NOTE — Progress Notes (Signed)
Tip placement from IR 04/30/2022 states "The catheter tip is positioned in the upper right atrium" and The CT chest 07/19/2022 reports "Left internal jugular single-lumen Port-A-Cath with tip terminating at the right atrium". Reviewed with Dr. Ellin Saba with verbal order ok to use tip for treatment.    NA 127 for today's treatment.  Provider made aware.  NACL 500 ml over one hour verbal order Dr. Ellin Saba.   Patient tolerated chemotherapy with no complaints voiced.  Side effects with management reviewed with understanding verbalized.  Port site clean and dry with no bruising or swelling noted at site.  Good blood return noted before and after administration of chemotherapy.  Band aid applied.  Patient left in satisfactory condition with VSS and no s/s of distress noted.

## 2022-09-15 ENCOUNTER — Other Ambulatory Visit: Payer: Self-pay

## 2022-09-17 ENCOUNTER — Ambulatory Visit
Admission: RE | Admit: 2022-09-17 | Discharge: 2022-09-17 | Disposition: A | Discharge status: Home / Self Care | Payer: Medicare Other | Source: Ambulatory Visit | Attending: Internal Medicine | Admitting: Internal Medicine

## 2022-09-17 NOTE — Progress Notes (Signed)
  Radiation Oncology         [phone] ________________________________  Name: Carlos [name] MRN: [id number]  Date of Service: 09/17/2022  DOB: 07-May-1955  Post Treatment Telephone Note  Diagnosis:  Stage IIIA, cT4N0M0, NSCLC, adenocarcinoma of the RUL (as documented in provider EOT note)  The patient was not available for call today. Voicemail left.  The patient has scheduled follow up with his medical oncologist Dr. Ellin Saba for ongoing care, and was encouraged to call if he develops concerns or questions regarding radiation.    Ruel Favors, LPN

## 2022-09-20 ENCOUNTER — Inpatient Hospital Stay: Payer: Medicare Other | Attending: Hematology

## 2022-09-20 ENCOUNTER — Inpatient Hospital Stay: Payer: Medicare Other

## 2022-09-20 ENCOUNTER — Ambulatory Visit: Payer: Medicare Other | Admitting: Hematology

## 2022-09-20 VITALS — BP 158/83 | HR 79 | Temp 97.7°F | Resp 20

## 2022-09-20 DIAGNOSIS — Z79899 Other long term (current) drug therapy: Secondary | ICD-10-CM | POA: Insufficient documentation

## 2022-09-20 DIAGNOSIS — C3491 Malignant neoplasm of unspecified part of right bronchus or lung: Secondary | ICD-10-CM

## 2022-09-20 DIAGNOSIS — C3411 Malignant neoplasm of upper lobe, right bronchus or lung: Secondary | ICD-10-CM | POA: Diagnosis not present

## 2022-09-20 DIAGNOSIS — Z5111 Encounter for antineoplastic chemotherapy: Secondary | ICD-10-CM | POA: Diagnosis not present

## 2022-09-20 DIAGNOSIS — Z95828 Presence of other vascular implants and grafts: Secondary | ICD-10-CM

## 2022-09-20 DIAGNOSIS — R918 Other nonspecific abnormal finding of lung field: Secondary | ICD-10-CM

## 2022-09-20 LAB — COMPREHENSIVE METABOLIC PANEL
ALT: 25 U/L (ref 0–44)
AST: 25 U/L (ref 15–41)
Albumin: 4.1 g/dL (ref 3.5–5.0)
Alkaline Phosphatase: 60 U/L (ref 38–126)
Anion gap: 12 (ref 5–15)
BUN: 10 mg/dL (ref 8–23)
CO2: 21 mmol/L — ABNORMAL LOW (ref 22–32)
Calcium: 8.9 mg/dL (ref 8.9–10.3)
Chloride: 94 mmol/L — ABNORMAL LOW (ref 98–111)
Creatinine, Ser: 0.78 mg/dL (ref 0.61–1.24)
GFR, Estimated: >60 mL/min (ref >60)
Glucose, Bld: 105 mg/dL — ABNORMAL HIGH (ref 70–99)
Potassium: 4 mmol/L (ref 3.5–5.1)
Sodium: 127 mmol/L — ABNORMAL LOW (ref 135–145)
Total Bilirubin: 0.9 mg/dL (ref 0.3–1.2)
Total Protein: 7.5 g/dL (ref 6.5–8.1)

## 2022-09-20 LAB — CBC WITH DIFFERENTIAL/PLATELET
Abs Immature Granulocytes: 0.05 10*3/uL (ref 0.00–0.07)
Basophils Absolute: 0.1 10*3/uL (ref 0.0–0.1)
Basophils Relative: 1 %
Eosinophils Absolute: 0.2 10*3/uL (ref 0.0–0.5)
Eosinophils Relative: 3 %
HCT: 39.7 % (ref 39.0–52.0)
Hemoglobin: 14 g/dL (ref 13.0–17.0)
Immature Granulocytes: 1 %
Lymphocytes Relative: 6 %
Lymphs Abs: 0.5 10*3/uL — ABNORMAL LOW (ref 0.7–4.0)
MCH: 34.3 pg — ABNORMAL HIGH (ref 26.0–34.0)
MCHC: 35.3 g/dL (ref 30.0–36.0)
MCV: 97.3 fL (ref 80.0–100.0)
Monocytes Absolute: 1 10*3/uL (ref 0.1–1.0)
Monocytes Relative: 12 %
Neutro Abs: 6.7 10*3/uL (ref 1.7–7.7)
Neutrophils Relative %: 77 %
Platelets: 238 10*3/uL (ref 150–400)
RBC: 4.08 MIL/uL — ABNORMAL LOW (ref 4.22–5.81)
RDW: 14.2 % (ref 11.5–15.5)
WBC: 8.6 10*3/uL (ref 4.0–10.5)
nRBC: 0 % (ref 0.0–0.2)

## 2022-09-20 LAB — MAGNESIUM: Magnesium: 1.8 mg/dL (ref 1.7–2.4)

## 2022-09-20 MED ORDER — SODIUM CHLORIDE 0.9% FLUSH
10.0000 mL | Freq: Once | INTRAVENOUS | Status: AC
  Administered 2022-09-20: 10 mL via INTRAVENOUS

## 2022-09-20 MED ORDER — HEPARIN SOD (PORK) LOCK FLUSH 100 UNIT/ML IV SOLN
500.0000 [IU] | Freq: Once | INTRAVENOUS | Status: AC | PRN
  Administered 2022-09-20: 500 [IU]

## 2022-09-20 MED ORDER — SODIUM CHLORIDE 0.9% FLUSH
10.0000 mL | INTRAVENOUS | Status: DC | PRN
  Administered 2022-09-20: 10 mL

## 2022-09-20 MED ORDER — SODIUM CHLORIDE 0.9 % IV SOLN: Freq: Once | INTRAVENOUS | Status: AC

## 2022-09-20 MED ORDER — SODIUM CHLORIDE 0.9 % IV SOLN
10.0000 mg/kg | Freq: Once | INTRAVENOUS | Status: AC
  Administered 2022-09-20: 860 mg via INTRAVENOUS
  Filled 2022-09-20: qty 10

## 2022-09-20 NOTE — Patient Instructions (Signed)
[facility]  Discharge Instructions: Thank you for choosing [facility] to provide your oncology and hematology care.  If you have a lab appointment with the Cancer Center - please note that after April 8th, 2024, all labs will be drawn in the cancer center.  You do not have to check in or register with the main entrance as you have in the past but will complete your check-in in the cancer center.  Wear comfortable clothing and clothing appropriate for easy access to any Portacath or PICC line.   We strive to give you quality time with your provider. You may need to reschedule your appointment if you arrive late (15 or more minutes).  Arriving late affects you and other patients whose appointments are after yours.  Also, if you miss three or more appointments without notifying the office, you may be dismissed from the clinic at the provider's discretion.      For prescription refill requests, have your pharmacy contact our office and allow 72 hours for refills to be completed.    Today you received the following chemotherapy and/or immunotherapy agents Imfinzi.  Durvalumab Injection What is this medication? DURVALUMAB (dur VAL ue mab) treats some types of cancer. It works by helping your immune system slow or stop the spread of cancer cells. It is a monoclonal antibody. This medicine may be used for other purposes; ask your health care provider or pharmacist if you have questions. COMMON BRAND NAME(S): IMFINZI What should I tell my care team before I take this medication? They need to know if you have any of these conditions: Allogeneic stem cell transplant (uses someone else's stem cells) Autoimmune diseases, such as Crohn disease, ulcerative colitis, lupus History of chest radiation Nervous system problems, such as Guillain-Barre syndrome, myasthenia gravis Organ transplant An unusual or allergic reaction to durvalumab, other medications, foods, dyes, or  preservatives Pregnant or trying to get pregnant Breast-feeding How should I use this medication? This medication is infused into a vein. It is given by your care team in a hospital or clinic setting. A special MedGuide will be given to you before each treatment. Be sure to read this information carefully each time. Talk to your care team about the use of this medication in children. Special care may be needed. Overdosage: If you think you have taken too much of this medicine contact a poison control center or emergency room at once. NOTE: This medicine is only for you. Do not share this medicine with others. What if I miss a dose? Keep appointments for follow-up doses. It is important not to miss your dose. Call your care team if you are unable to keep an appointment. What may interact with this medication? Interactions have not been studied. This list may not describe all possible interactions. Give your health care provider a list of all the medicines, herbs, non-prescription drugs, or dietary supplements you use. Also tell them if you smoke, drink alcohol, or use illegal drugs. Some items may interact with your medicine. What should I watch for while using this medication? Your condition will be monitored carefully while you are receiving this medication. You may need blood work while taking this medication. This medication may cause serious skin reactions. They can happen weeks to months after starting the medication. Contact your care team right away if you notice fevers or flu-like symptoms with a rash. The rash may be red or purple and then turn into blisters or peeling of   the skin. You may also notice a red rash with swelling of the face, lips, or lymph nodes in your neck or under your arms. Tell your care team right away if you have any change in your eyesight. Talk to your care team if you may be pregnant. Serious birth defects can occur if you take this medication during pregnancy and  for 3 months after the last dose. You will need a negative pregnancy test before starting this medication. Contraception is recommended while taking this medication and for 3 months after the last dose. Your care team can help you find the option that works for you. Do not breastfeed while taking this medication and for 3 months after the last dose. What side effects may I notice from receiving this medication? Side effects that you should report to your care team as soon as possible: Allergic reactions--skin rash, itching, hives, swelling of the face, lips, tongue, or throat Dry cough, shortness of breath or trouble breathing Eye pain, redness, irritation, or discharge with blurry or decreased vision Heart muscle inflammation--unusual weakness or fatigue, shortness of breath, chest pain, fast or irregular heartbeat, dizziness, swelling of the ankles, feet, or hands Hormone gland problems--headache, sensitivity to light, unusual weakness or fatigue, dizziness, fast or irregular heartbeat, increased sensitivity to cold or heat, excessive sweating, constipation, hair loss, increased thirst or amount of urine, tremors or shaking, irritability Infusion reactions--chest pain, shortness of breath or trouble breathing, feeling faint or lightheaded Kidney injury (glomerulonephritis)--decrease in the amount of urine, red or dark brown urine, foamy or bubbly urine, swelling of the ankles, hands, or feet Liver injury--right upper belly pain, loss of appetite, nausea, light-colored stool, dark yellow or brown urine, yellowing skin or eyes, unusual weakness or fatigue Pain, tingling, or numbness in the hands or feet, muscle weakness, change in vision, confusion or trouble speaking, loss of balance or coordination, trouble walking, seizures Rash, fever, and swollen lymph nodes Redness, blistering, peeling, or loosening of the skin, including inside the mouth Sudden or severe stomach pain, bloody diarrhea, fever,  nausea, vomiting Side effects that usually do not require medical attention (report these to your care team if they continue or are bothersome): Bone, joint, or muscle pain Diarrhea Fatigue Loss of appetite Nausea Skin rash This list may not describe all possible side effects. Call your doctor for medical advice about side effects. You may report side effects to FDA at 1-800-FDA-1088. Where should I keep my medication? This medication is given in a hospital or clinic. It will not be stored at home. NOTE: This sheet is a summary. It may not cover all possible information. If you have questions about this medicine, talk to your doctor, pharmacist, or health care provider.  2024 Elsevier/Gold Standard (2021-05-09 00:00:00)       To help prevent nausea and vomiting after your treatment, we encourage you to take your nausea medication as directed.  BELOW ARE SYMPTOMS THAT SHOULD BE REPORTED IMMEDIATELY: *FEVER GREATER THAN 100.4 F (38 C) OR HIGHER *CHILLS OR SWEATING *NAUSEA AND VOMITING THAT IS NOT CONTROLLED WITH YOUR NAUSEA MEDICATION *UNUSUAL SHORTNESS OF BREATH *UNUSUAL BRUISING OR BLEEDING *URINARY PROBLEMS (pain or burning when urinating, or frequent urination) *BOWEL PROBLEMS (unusual diarrhea, constipation, pain near the anus) TENDERNESS IN MOUTH AND THROAT WITH OR WITHOUT PRESENCE OF ULCERS (sore throat, sores in mouth, or a toothache) UNUSUAL RASH, SWELLING OR PAIN  UNUSUAL VAGINAL DISCHARGE OR ITCHING   Items with * indicate a potential emergency and should be followed   up as soon as possible or go to the Emergency Department if any problems should occur.  Please show the CHEMOTHERAPY ALERT CARD or IMMUNOTHERAPY ALERT CARD at check-in to the Emergency Department and triage nurse.  Should you have questions after your visit or need to cancel or reschedule your appointment, please contact [facility] [phone]  and follow the prompts.  Office hours are  8:00 a.m. to 4:30 p.m. Monday - Friday. Please note that voicemails left after 4:00 p.m. may not be returned until the following business day.  We are closed weekends and major holidays. You have access to a nurse at all times for urgent questions. Please call the main number to the clinic [phone] and follow the prompts.  For any non-urgent questions, you may also contact your provider using MyChart. We now offer e-Visits for anyone 18 and older to request care online for non-urgent symptoms. For details visit [email or web address].   Also download the MyChart app! Go to the app store, search "MyChart", open the app, select [facility], and log in with your MyChart username and password.   

## 2022-09-20 NOTE — Progress Notes (Signed)
Patient presents today for chemotherapy infusion Imfinzi.  Patient is in satisfactory condition with no new complaints voiced.  Vital signs are stable.  Labs reviewed and all labs are within treatment parameters.  We will proceed with treatment per MD orders.    Patient tolerated treatment well with no complaints voiced.  Patient left ambulatory in stable condition.  Vital signs stable at discharge.  Follow up as scheduled.

## 2022-10-01 ENCOUNTER — Ambulatory Visit (HOSPITAL_COMMUNITY)
Admission: RE | Admit: 2022-10-01 | Discharge: 2022-10-01 | Disposition: A | Discharge status: Home / Self Care | Payer: Medicare Other | Source: Ambulatory Visit | Attending: Hematology | Admitting: Hematology

## 2022-10-01 DIAGNOSIS — C3491 Malignant neoplasm of unspecified part of right bronchus or lung: Secondary | ICD-10-CM | POA: Diagnosis not present

## 2022-10-01 DIAGNOSIS — C3490 Malignant neoplasm of unspecified part of unspecified bronchus or lung: Secondary | ICD-10-CM | POA: Diagnosis not present

## 2022-10-01 DIAGNOSIS — I700 Atherosclerosis of aorta: Secondary | ICD-10-CM | POA: Diagnosis not present

## 2022-10-01 MED ORDER — IOHEXOL 350 MG/ML SOLN: 75.0000 mL | Freq: Once | INTRAVENOUS | Status: DC | PRN

## 2022-10-01 MED ORDER — IOHEXOL 300 MG/ML  SOLN
75.0000 mL | Freq: Once | INTRAMUSCULAR | Status: AC | PRN
  Administered 2022-10-01: 75 mL via INTRAVENOUS

## 2022-10-10 NOTE — Progress Notes (Signed)
[facility] [address] [place] [address]    Clinic Day:  10/11/22   Referring physician: Benita Stabile, MD  Patient Care Team: Benita Stabile, MD as PCP - General (Internal Medicine) Doreatha Massed, MD as Medical Oncologist (Medical Oncology) Therese Sarah, RN as Oncology Nurse Navigator (Medical Oncology)   ASSESSMENT & PLAN:   Assessment: 1.  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  No hemoptysis/weight loss. - Chest x-ray (04/10/2022): Mid right lung mass. - CT chest (04/13/2022): 9.2 x 5.7 x 6.6 cm right upper lobe mass with lobulations appearing to extend across the minor fissure into the right middle lobe and right lower lobe.  Satellite lesion in the right upper lobe measures 1.6 x 1.4 x 1.4 cm.  No findings of chest wall invasion.  Borderline prominent lower right paratracheal lymph node 0.9 cm.  Old granulomatous disease.  Small left adrenal adenoma. - Has right posterior chest wall pain for the past 6 months, dull type.  He is not requiring any pain medication. - Brain MRI (04/19/2022): No metastatic disease. - PET scan (04/19/2022): 9 x 5.7 cm right upper lobe mass with SUV 11.8.  1.3 x 0.9 cm satellite nodule in the right upper lobe with SUV 3.8.  No adenopathy or distant metastatic disease. - Bronchoscopy and biopsy by Dr. Delton Coombes - Pathology (04/23/2022): Both right upper lobe lung nodule FNA consistent with poorly differentiated carcinoma.  IHC diffusely positive for TTF-1, Napsin, synaptophysin and CK7.  CD56 negative.  P63 and CK5/6 negative.  Given Napsin positivity, favor adenocarcinoma.  There is also diffuse synaptophysin positivity consistent with neuroendocrine differentiation.  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 - Discussed with Dr. Dorris Fetch.  Not a surgical candidate as he requires pneumonectomy and has  suboptimal DLCO. - Chemoradiation with carboplatin and paclitaxel from 05/22/2022 through 07/06/2022 - Consolidation durvalumab started on 07/27/2022   2.  Social/family history: - He is seen with his 2 sisters today.  Lives by himself and is independent of ADLs and IADLs.  He retired after working at [employer] for the last 34 years.  No asbestos exposure.  No other chemicals.  Smokes more than 1 pack/day for the last 50 years (started at age 76) quit smoking for 5 years before starting back again. - Brother died of lung cancer.  Father had bladder cancer.  Maternal uncle had cancer.    Plan: 1.  Stage IIIa adenocarcinoma of the right lung: - He is tolerating Imfinzi reasonably well. - He reported mid back pain for 2 weeks, gets better on lying down and worse on sitting.  Pain is reported as dull with no radiation.  Most likely musculoskeletal.  Will monitor closely. - Reviewed labs today: Normal LFTs and creatinine.  CBC grossly normal.  Sodium is 127 and stable. - CT chest from 10/01/2022: Dominant right upper lobe mass and smaller right upper lobe nodule have reduced in size with no new findings. - He is continuing to smoke 1 pack of cigarettes per day.  Could not take Chantix secondary to insomnia. - Continue treatment today and every 2 weeks.  RTC 6 weeks for follow-up.   2.  Anxiety/depression: - Continue clonazepam 1 mg twice daily.   3.  COPD: - Continue Tessalon Perles twice daily.  Cough is well-controlled.    Orders Placed This Encounter  Procedures   CBC with Differential    Standing Status:   Future    Standing Expiration Date:   12/06/2023   Comprehensive metabolic panel    Standing Status:   Future    Standing Expiration Date:   12/06/2023   T4    Standing Status:   Future    Standing Expiration Date:   12/06/2023   TSH    Standing Status:   Future    Standing Expiration Date:   12/06/2023      [name],acting as a scribe for Doreatha Massed,  MD.,have documented all relevant documentation on the behalf of Doreatha Massed, MD,as directed by  Doreatha Massed, MD while in the presence of Doreatha Massed, MD.  I, Doreatha Massed MD, have reviewed the above documentation for accuracy and completeness, and I agree with the above.      Doreatha Massed, MD   10/3/20245:59 PM  CHIEF COMPLAINT:   Diagnosis: RUL lung adenocarcinoma    Cancer Staging  Non-small cell cancer of right lung [facility]) Staging form: Lung, AJCC 8th Edition - Clinical stage from 05/01/2022: Stage IIIA (cT4, cN0, cM0) - Unsigned    Prior Therapy: none  Current Therapy:  concurrent chemoradiation with carboplatin + paclitaxel    HISTORY OF PRESENT ILLNESS:   Oncology History  Non-small cell cancer of right lung (HCC)  05/01/2022 Initial Diagnosis   Non-small cell cancer of right lung (HCC)   05/22/2022 - 07/05/2022 Chemotherapy   Patient is on Treatment Plan : LUNG Carboplatin + Paclitaxel + XRT q7d     07/27/2022 -  Chemotherapy   Patient is on Treatment Plan : LUNG Durvalumab (10) q14d        INTERVAL HISTORY:   Carlos [name] is a 67 y.o. male presenting to clinic today for follow up of RUL lung adenocarcinoma. He was last seen by me on 08/09/22.  Since her last visit, she underwent CT chest on 10/01/22 that found: the dominant right upper lobe mass has reduced in size since 07/19/2022 and the smaller right upper lobe nodule has also reduced in size.  Today, he states that he is doing well overall. His appetite level is at 90%. His energy level is at 40%. He is accompanied by his wife and family member.   He c/o diarrhea that averages 1x a day with watery stools, though he had one day of having diarrhea 5 times. He continues to smoke 1 ppd and could not tolerate Chantix, as he reports insomnia when taking it. He finished the loading patch of Chantix before discontinuing.   He c/o dull right middle back pain and dull left lower back pain  that is not tender upon palpation for the past 2 weeks. He has not done any heavy lifting. Pain is improved when lying down and worsened when sitting up motionless. He denies pain radiating to the legs.   PAST MEDICAL HISTORY:   Past Medical History: Past Medical History:  Diagnosis Date   Anxiety    Cancer (HCC)    Skin cancer left shoulder   Chronic low back pain    Complication of anesthesia    Dermatofibrosarcoma protubera of right shoulder    Dyspnea    GERD (gastroesophageal reflux disease)    Hyperlipidemia    Lung cancer (HCC) 04/23/2022   PONV (postoperative nausea and vomiting)    Port-A-Cath in place 05/17/2022   Prostate cancer [facility])     Surgical History: Past Surgical History:  Procedure Laterality Date  APPENDECTOMY     APPLICATION OF A-CELL OF EXTREMITY Right 07/09/2012   Procedure: PLACEMENT OF A-CELL TO UPPER EXTREMITY/PLACEMENT OF VAC;  Surgeon: Wayland Denis, DO;  Location: [facility];  Service: Plastics;  Laterality: Right;   BRONCHIAL BRUSHINGS  04/23/2022   Procedure: BRONCHIAL BRUSHINGS;  Surgeon: Leslye Peer, MD;  Location: [facility] ENDOSCOPY;  Service: Pulmonary;;   BRONCHIAL NEEDLE ASPIRATION BIOPSY  04/23/2022   Procedure: BRONCHIAL NEEDLE ASPIRATION BIOPSIES;  Surgeon: Leslye Peer, MD;  Location: [facility] ENDOSCOPY;  Service: Pulmonary;;   COLONOSCOPY WITH PROPOFOL N/A 09/30/2015   Procedure: COLONOSCOPY WITH PROPOFOL;  Surgeon: Malissa Hippo, MD;  Location: [facility] ENDO SUITE;  Service: Endoscopy;  Laterality: N/A;  830   EXCISION DERMATOFIBROCARCOMA PROTUBERAN RIGHT SHOULDER  2 - 3 WKS AGO   HEMOSTASIS CONTROL  04/23/2022   Procedure: HEMOSTASIS CONTROL;  Surgeon: Leslye Peer, MD;  Location: [facility] ENDOSCOPY;  Service: Pulmonary;;   INGUINAL HERNIA REPAIR Left 1999   IR IMAGING GUIDED PORT INSERTION  04/30/2022   KNEE ARTHROSCOPY Left 2012   POLYPECTOMY  09/30/2015   Procedure: POLYPECTOMY;  Surgeon: Malissa Hippo, MD;  Location: [facility] ENDO  SUITE;  Service: Endoscopy;;  sigmoid   VIDEO BRONCHOSCOPY WITH ENDOBRONCHIAL ULTRASOUND Bilateral 04/23/2022   Procedure: VIDEO BRONCHOSCOPY WITH ENDOBRONCHIAL ULTRASOUND;  Surgeon: Leslye Peer, MD;  Location[facility] ENDOSCOPY;  Service: Pulmonary;  Laterality: Bilateral;   VIDEO BRONCHOSCOPY WITH RADIAL ENDOBRONCHIAL ULTRASOUND  04/23/2022   Procedure: VIDEO BRONCHOSCOPY WITH RADIAL ENDOBRONCHIAL ULTRASOUND;  Surgeon: Leslye Peer, MD;  Location: [facility] ENDOSCOPY;  Service: Pulmonary;;    Social History: Social History   Socioeconomic History   Marital status: Divorced    Spouse name: Burna Mortimer   Number of children: 1   Years of education: 10th   Highest education level: Not on file  Occupational History    Employer: [employer]    Comment: [employer]  Tobacco Use   Smoking status: Every Day    Current packs/day: 1.00    Average packs/day: 1 pack/day for 40.0 years (40.0 ttl pk-yrs)    Types: Cigarettes   Smokeless tobacco: Never  Vaping Use   Vaping status: Never Used  Substance and Sexual Activity   Alcohol use: Yes    Comment: 2-3 cans beer/day   Drug use: No   Sexual activity: Not on file  Other Topics Concern   Not on file  Social History Narrative   Patient lives at home with his dog. 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 Patient has 10 grade education.Patient is right-handed.Caffeine Use: 1 cup of coffee; 3-4 sodas daily   Social Determinants of Health   Financial Resource Strain: High Risk (05/10/2022)   Overall Financial Resource Strain (CARDIA)    Difficulty of Paying Living Expenses: Very hard  Food Insecurity: Food Insecurity Present (05/04/2022)   Hunger Vital Sign    Worried About Running Out of Food in the Last Year: Sometimes true    Ran Out of Food in the Last Year: Sometimes true  Transportation Needs: No Transportation Needs (05/04/2022)   PRAPARE - Administrator, Civil Service (Medical): No     Lack of Transportation (Non-Medical): No  Physical Activity: Not on file  Stress: Not on file  Social Connections: Not on file  Intimate Partner Violence: Not At Risk (05/04/2022)   Humiliation, Afraid, Rape, and Kick questionnaire    Fear of Current or Ex-Partner: No  Emotionally Abused: No    Physically Abused: No    Sexually Abused: No    Family History: Family History  Problem Relation Age of Onset   Dementia Mother    Cancer Father     Current Medications:  Current Outpatient Medications:    acetaminophen (TYLENOL) 500 MG tablet, Take 1,000 mg by mouth every 6 (six) hours as needed (pain.)., Disp: , Rfl:    albuterol (VENTOLIN HFA) 108 (90 Base) MCG/ACT inhaler, Inhale 2 puffs into the lungs every 6 (six) hours as needed for wheezing or shortness of breath., Disp: 8 g, Rfl: 6   aspirin EC 81 MG tablet, Take 81 mg by mouth at bedtime. Swallow whole., Disp: , Rfl:    benzonatate (TESSALON) 200 MG capsule, Take 1 capsule (200 mg total) by mouth 3 (three) times daily as needed for cough., Disp: 60 capsule, Rfl: 1   clonazePAM (KLONOPIN) 1 MG disintegrating tablet, Take one tablet by mouth every morning and two tablets by mouth at bedtime, Disp: 90 tablet, Rfl: 2   Durvalumab (IMFINZI IV), Inject into the vein every 14 (fourteen) days., Disp: , Rfl:    meloxicam (MOBIC) 15 MG tablet, Take 15 mg by mouth in the morning., Disp: , Rfl:    pantoprazole (PROTONIX) 40 MG tablet, Take 40 mg by mouth daily., Disp: , Rfl:    simvastatin (ZOCOR) 20 MG tablet, Take 20 mg by mouth at bedtime., Disp: , Rfl: 2   sucralfate (CARAFATE) 1 g tablet, Take 1 tablet (1 g total) by mouth 4 (four) times daily. Dissolve each tablet in 15 cc water before use., Disp: 120 tablet, Rfl: 2   tamsulosin (FLOMAX) 0.4 MG CAPS capsule, Take 1 capsule (0.4 mg total) by mouth daily. (Patient taking differently: Take 0.4 mg by mouth at bedtime.), Disp: 30 capsule, Rfl: 11 No current facility-administered medications  for this visit.  Facility-Administered Medications Ordered in Other Visits:    sodium chloride flush (NS) 0.9 % injection 10 mL, 10 mL, Intracatheter, PRN, Doreatha Massed, MD, 10 mL at 10/11/22 1322   Allergies: Allergies  Allergen Reactions   Ciprofloxacin Swelling and Other (See Comments)    Swelling in hands, numbness in arms and joint pain   Gabapentin Other (See Comments)    Double vision    REVIEW OF SYSTEMS:   Review of Systems  Constitutional:  Negative for chills, fatigue and fever.  HENT:   Negative for lump/mass, mouth sores, nosebleeds, sore throat and trouble swallowing.   Eyes:  Negative for eye problems.  Respiratory:  Positive for cough and shortness of breath.   Cardiovascular:  Negative for chest pain, leg swelling and palpitations.  Gastrointestinal:  Positive for diarrhea. Negative for abdominal pain, constipation, nausea and vomiting.  Genitourinary:  Negative for bladder incontinence, difficulty urinating, dysuria, frequency, hematuria and nocturia.   Musculoskeletal:  Positive for back pain (lower back, 6-7/10 severity). Negative for arthralgias, flank pain, myalgias and neck pain.  Skin:  Negative for itching and rash.  Neurological:  Negative for dizziness, headaches and numbness.  Hematological:  Does not bruise/bleed easily.  Psychiatric/Behavioral:  Negative for depression, sleep disturbance and suicidal ideas. The patient is nervous/anxious.   All other systems reviewed and are negative.    VITALS:   There were no vitals taken for this visit.  Wt Readings from Last 3 Encounters:  10/11/22 188 lb 8 oz (85.5 kg)  09/20/22 189 lb 8 oz (86 kg)  09/06/22 190 lb 9.6 oz (86.5 kg)  There is no height or weight on file to calculate BMI.  Performance status (ECOG): 1 - Symptomatic but completely ambulatory  PHYSICAL EXAM:   Physical Exam Vitals and nursing note reviewed. Exam conducted with a chaperone present.  Constitutional:       Appearance: Normal appearance.  Cardiovascular:     Rate and Rhythm: Normal rate and regular rhythm.     Pulses: Normal pulses.     Heart sounds: Normal heart sounds.  Pulmonary:     Effort: Pulmonary effort is normal.     Breath sounds: Normal breath sounds.  Abdominal:     Palpations: Abdomen is soft. There is no hepatomegaly, splenomegaly or mass.     Tenderness: There is no abdominal tenderness.  Musculoskeletal:     Right lower leg: No edema.     Left lower leg: No edema.  Lymphadenopathy:     Cervical: No cervical adenopathy.     Right cervical: No superficial, deep or posterior cervical adenopathy.    Left cervical: No superficial, deep or posterior cervical adenopathy.     Upper Body:     Right upper body: No supraclavicular or axillary adenopathy.     Left upper body: No supraclavicular or axillary adenopathy.  Neurological:     General: No focal deficit present.     Mental Status: He is alert and oriented to person, place, and time.  Psychiatric:        Mood and Affect: Mood normal.        Behavior: Behavior normal.     LABS:      Latest Ref Rng & Units 10/11/2022    9:53 AM 09/20/2022    8:18 AM 09/06/2022    8:52 AM  CBC  WBC 4.0 - 10.5 K/uL 7.7  8.6  7.2   Hemoglobin 13.0 - 17.0 g/dL 24.4  01.0  27.2   Hematocrit 39.0 - 52.0 % 40.9  39.7  37.4   Platelets 150 - 400 K/uL 270  238  266       Latest Ref Rng & Units 10/11/2022    9:53 AM 09/20/2022    8:18 AM 09/06/2022    8:52 AM  CMP  Glucose 70 - 99 mg/dL 89  536  97   BUN 8 - 23 mg/dL 5  10  8    Creatinine 0.61 - 1.24 mg/dL 6.44  0.34  7.42   Sodium 135 - 145 mmol/L 127  127  127   Potassium 3.5 - 5.1 mmol/L 4.1  4.0  4.2   Chloride 98 - 111 mmol/L 94  94  95   CO2 22 - 32 mmol/L 19  21  23    Calcium 8.9 - 10.3 mg/dL 8.6  8.9  8.6   Total Protein 6.5 - 8.1 g/dL 7.2  7.5  6.9   Total Bilirubin 0.3 - 1.2 mg/dL 0.4  0.9  0.9   Alkaline Phos 38 - 126 U/L 64  60  58   AST 15 - 41 U/L 27  25  28    ALT 0 -  44 U/L 29  25  27       No results found for: "CEA1", "CEA" / No results found for: "CEA1", "CEA" Lab Results  Component Value Date   PSA1 4.7 (H) 04/12/2022   No results found for: "VZD638" No results found for: "CAN125"  No results found for: "TOTALPROTELP", "ALBUMINELP", "A1GS", "A2GS", "BETS", "BETA2SER", "GAMS", "MSPIKE", "SPEI" No results found for: "TIBC", "FERRITIN", "IRONPCTSAT" No results  found for: "LDH"   STUDIES:   CT Chest W Contrast  Result Date: 10/10/2022 CLINICAL DATA:  Non-small cell lung cancer restaging * Tracking Code: BO * EXAM: CT CHEST WITH CONTRAST TECHNIQUE: Multidetector CT imaging of the chest was performed during intravenous contrast administration. RADIATION DOSE REDUCTION: This exam was performed according to the departmental dose-optimization program which includes automated exposure control, adjustment of the mA and/or kV according to patient size and/or use of iterative reconstruction technique. CONTRAST:  75mL OMNIPAQUE IOHEXOL 300 MG/ML  SOLN COMPARISON:  07/19/2022 FINDINGS: Cardiovascular: Left Port-A-Cath tip: Right atrium. Coronary, aortic arch, and branch vessel atherosclerotic vascular disease. Mediastinum/Nodes: Unremarkable Lungs/Pleura: Irregular right upper lobe nodule 1.5 by 0.9 cm on image 39 series 2, formerly measured at 1.4 by 1.8 cm. Mildly reduced in size. A masslike right upper lobe lesion along the major fissure and minor fissure measures 4.8 by 3 point 4 cm on image 60 of series 2, previously measuring 6.3 by 4.3 cm on 07/19/2022. This lesion has some surrounding interstitial accentuation which may be from inflammation or less likely lymphangitic spread of tumor. There is also airway thickening and in the adjacent upper lobe airways. Stable 3 mm superior segment right lower lobe nodule on image 63 series 2. Stable 3 mm nodule along the left hemidiaphragm on image 123, series 2. Small calcified left lower lobe nodule on image 72 series 2,  likely a granuloma. Upper Abdomen: Unremarkable Musculoskeletal: Unremarkable IMPRESSION: 1. The dominant right upper lobe mass has reduced in size since 07/19/2022. The smaller right upper lobe nodule has also reduced in size. 2. Aortic and coronary atherosclerosis. Aortic Atherosclerosis (ICD10-I70.0). Electronically Signed   By: Gaylyn Rong M.D.   On: 10/10/2022 18:22

## 2022-10-11 ENCOUNTER — Encounter: Payer: Self-pay | Admitting: Hematology

## 2022-10-11 ENCOUNTER — Inpatient Hospital Stay: Payer: Medicare Other

## 2022-10-11 ENCOUNTER — Inpatient Hospital Stay: Payer: Medicare Other | Attending: Hematology

## 2022-10-11 ENCOUNTER — Inpatient Hospital Stay (HOSPITAL_BASED_OUTPATIENT_CLINIC_OR_DEPARTMENT_OTHER): Payer: Medicare Other | Admitting: Hematology

## 2022-10-11 VITALS — BP 155/76 | HR 97 | Temp 98.0°F | Resp 20

## 2022-10-11 DIAGNOSIS — C3491 Malignant neoplasm of unspecified part of right bronchus or lung: Secondary | ICD-10-CM

## 2022-10-11 DIAGNOSIS — Z7982 Long term (current) use of aspirin: Secondary | ICD-10-CM | POA: Insufficient documentation

## 2022-10-11 DIAGNOSIS — Z95828 Presence of other vascular implants and grafts: Secondary | ICD-10-CM

## 2022-10-11 DIAGNOSIS — J449 Chronic obstructive pulmonary disease, unspecified: Secondary | ICD-10-CM | POA: Diagnosis not present

## 2022-10-11 DIAGNOSIS — F17210 Nicotine dependence, cigarettes, uncomplicated: Secondary | ICD-10-CM | POA: Diagnosis not present

## 2022-10-11 DIAGNOSIS — F32A Depression, unspecified: Secondary | ICD-10-CM | POA: Insufficient documentation

## 2022-10-11 DIAGNOSIS — Z5112 Encounter for antineoplastic immunotherapy: Secondary | ICD-10-CM | POA: Diagnosis not present

## 2022-10-11 DIAGNOSIS — C3411 Malignant neoplasm of upper lobe, right bronchus or lung: Secondary | ICD-10-CM | POA: Insufficient documentation

## 2022-10-11 DIAGNOSIS — F419 Anxiety disorder, unspecified: Secondary | ICD-10-CM | POA: Insufficient documentation

## 2022-10-11 DIAGNOSIS — Z79899 Other long term (current) drug therapy: Secondary | ICD-10-CM | POA: Diagnosis not present

## 2022-10-11 DIAGNOSIS — R918 Other nonspecific abnormal finding of lung field: Secondary | ICD-10-CM

## 2022-10-11 LAB — CBC WITH DIFFERENTIAL/PLATELET
Abs Immature Granulocytes: 0.06 10*3/uL (ref 0.00–0.07)
Basophils Absolute: 0.1 10*3/uL (ref 0.0–0.1)
Basophils Relative: 1 %
Eosinophils Absolute: 0.4 10*3/uL (ref 0.0–0.5)
Eosinophils Relative: 5 %
HCT: 40.9 % (ref 39.0–52.0)
Hemoglobin: 14.5 g/dL (ref 13.0–17.0)
Immature Granulocytes: 1 %
Lymphocytes Relative: 6 %
Lymphs Abs: 0.5 10*3/uL — ABNORMAL LOW (ref 0.7–4.0)
MCH: 33.6 pg (ref 26.0–34.0)
MCHC: 35.5 g/dL (ref 30.0–36.0)
MCV: 94.9 fL (ref 80.0–100.0)
Monocytes Absolute: 0.8 10*3/uL (ref 0.1–1.0)
Monocytes Relative: 10 %
Neutro Abs: 6 10*3/uL (ref 1.7–7.7)
Neutrophils Relative %: 77 %
Platelets: 270 10*3/uL (ref 150–400)
RBC: 4.31 MIL/uL (ref 4.22–5.81)
RDW: 12.6 % (ref 11.5–15.5)
WBC: 7.7 10*3/uL (ref 4.0–10.5)
nRBC: 0 % (ref 0.0–0.2)

## 2022-10-11 LAB — COMPREHENSIVE METABOLIC PANEL
ALT: 29 U/L (ref 0–44)
AST: 27 U/L (ref 15–41)
Albumin: 4 g/dL (ref 3.5–5.0)
Alkaline Phosphatase: 64 U/L (ref 38–126)
Anion gap: 14 (ref 5–15)
BUN: 5 mg/dL — ABNORMAL LOW (ref 8–23)
CO2: 19 mmol/L — ABNORMAL LOW (ref 22–32)
Calcium: 8.6 mg/dL — ABNORMAL LOW (ref 8.9–10.3)
Chloride: 94 mmol/L — ABNORMAL LOW (ref 98–111)
Creatinine, Ser: 0.76 mg/dL (ref 0.61–1.24)
GFR, Estimated: >60 mL/min (ref >60)
Glucose, Bld: 89 mg/dL (ref 70–99)
Potassium: 4.1 mmol/L (ref 3.5–5.1)
Sodium: 127 mmol/L — ABNORMAL LOW (ref 135–145)
Total Bilirubin: 0.4 mg/dL (ref 0.3–1.2)
Total Protein: 7.2 g/dL (ref 6.5–8.1)

## 2022-10-11 LAB — MAGNESIUM: Magnesium: 1.8 mg/dL (ref 1.7–2.4)

## 2022-10-11 MED ORDER — SODIUM CHLORIDE 0.9 % IV SOLN: Freq: Once | INTRAVENOUS | Status: AC

## 2022-10-11 MED ORDER — HEPARIN SOD (PORK) LOCK FLUSH 100 UNIT/ML IV SOLN
500.0000 [IU] | Freq: Once | INTRAVENOUS | Status: AC | PRN
  Administered 2022-10-11: 500 [IU]

## 2022-10-11 MED ORDER — SODIUM CHLORIDE 0.9% FLUSH
10.0000 mL | Freq: Once | INTRAVENOUS | Status: AC
  Administered 2022-10-11: 10 mL via INTRAVENOUS

## 2022-10-11 MED ORDER — SODIUM CHLORIDE 0.9 % IV SOLN
10.0000 mg/kg | Freq: Once | INTRAVENOUS | Status: AC
  Administered 2022-10-11: 860 mg via INTRAVENOUS
  Filled 2022-10-11: qty 10

## 2022-10-11 MED ORDER — SODIUM CHLORIDE 0.9% FLUSH
10.0000 mL | INTRAVENOUS | Status: DC | PRN
  Administered 2022-10-11: 10 mL

## 2022-10-11 NOTE — Patient Instructions (Addendum)
[facility] at [facility] Discharge Instructions   You were seen and examined today by Dr. Ellin Saba.  He reviewed the results of your lab work which are normal/stable.   He reviewed the results of your CT scan. It is showing the spots in the lung have decreased in size.   We will proceed with your treatment today.   Return as scheduled.    Thank you for choosing [facility] at [facility] to provide your oncology and hematology care.  To afford each patient quality time with our provider, please arrive at least 15 minutes before your scheduled appointment time.   If you have a lab appointment with the Cancer Center please come in thru the Main Entrance and check in at the main information desk.  You need to re-schedule your appointment should you arrive 10 or more minutes late.  We strive to give you quality time with our providers, and arriving late affects you and other patients whose appointments are after yours.  Also, if you no show three or more times for appointments you may be dismissed from the clinic at the providers discretion.     Again, thank you for choosing [facility].  Our hope is that these requests will decrease the amount of time that you wait before being seen by our physicians.       _____________________________________________________________  Should you have questions after your visit to [facility], please contact our office at [phone] and follow the prompts.  Our office hours are 8:00 a.m. and 4:30 p.m. Monday - Friday.  Please note that voicemails left after 4:00 p.m. may not be returned until the following business day.  We are closed weekends and major holidays.  You do have access to a nurse 24-7, just call the main number to the clinic [phone] and do not press any options, hold on the line and a nurse will answer the phone.    For prescription refill requests, have your  pharmacy contact our office and allow 72 hours.    Due to Covid, you will need to wear a mask upon entering the hospital. If you do not have a mask, a mask will be given to you at the Main Entrance upon arrival. For doctor visits, patients may have 1 support person age 66 or older with them. For treatment visits, patients can not have anyone with them due to social distancing guidelines and our immunocompromised population.

## 2022-10-11 NOTE — Progress Notes (Signed)
Patient has been examined by Dr. Katragadda. Vital signs and labs have been reviewed by MD - ANC, Creatinine, LFTs, hemoglobin, and platelets are within treatment parameters per M.D. - pt may proceed with treatment.  Primary RN and pharmacy notified.  

## 2022-10-11 NOTE — Progress Notes (Signed)
Patient presents today for chemotherapy infusion of Imfinzi. Patient is in satisfactory condition with no new complaints voiced.  Vital signs are stable. Labs reviewed by Dr. Ellin Saba during the office visit and all labs are within treatment parameters.  We will proceed with treatment per MD orders.   Patient tolerated treatment well with no complaints voiced.  Patient left ambulatory in stable condition.  Vital signs stable at discharge.  Follow up as scheduled.

## 2022-10-11 NOTE — Patient Instructions (Signed)
[facility]  Discharge Instructions: Thank you for choosing [facility] to provide your oncology and hematology care.  If you have a lab appointment with the Cancer Center - please note that after April 8th, 2024, all labs will be drawn in the cancer center.  You do not have to check in or register with the main entrance as you have in the past but will complete your check-in in the cancer center.  Wear comfortable clothing and clothing appropriate for easy access to any Portacath or PICC line.   We strive to give you quality time with your provider. You may need to reschedule your appointment if you arrive late (15 or more minutes).  Arriving late affects you and other patients whose appointments are after yours.  Also, if you miss three or more appointments without notifying the office, you may be dismissed from the clinic at the provider's discretion.      For prescription refill requests, have your pharmacy contact our office and allow 72 hours for refills to be completed.    Today you received the following chemotherapy and/or immunotherapy agents Imfinzi.  Durvalumab Injection What is this medication? DURVALUMAB (dur VAL ue mab) treats some types of cancer. It works by helping your immune system slow or stop the spread of cancer cells. It is a monoclonal antibody. This medicine may be used for other purposes; ask your health care provider or pharmacist if you have questions. COMMON BRAND NAME(S): IMFINZI What should I tell my care team before I take this medication? They need to know if you have any of these conditions: Allogeneic stem cell transplant (uses someone else's stem cells) Autoimmune diseases, such as Crohn disease, ulcerative colitis, lupus History of chest radiation Nervous system problems, such as Guillain-Barre syndrome, myasthenia gravis Organ transplant An unusual or allergic reaction to durvalumab, other medications, foods, dyes, or  preservatives Pregnant or trying to get pregnant Breast-feeding How should I use this medication? This medication is infused into a vein. It is given by your care team in a hospital or clinic setting. A special MedGuide will be given to you before each treatment. Be sure to read this information carefully each time. Talk to your care team about the use of this medication in children. Special care may be needed. Overdosage: If you think you have taken too much of this medicine contact a poison control center or emergency room at once. NOTE: This medicine is only for you. Do not share this medicine with others. What if I miss a dose? Keep appointments for follow-up doses. It is important not to miss your dose. Call your care team if you are unable to keep an appointment. What may interact with this medication? Interactions have not been studied. This list may not describe all possible interactions. Give your health care provider a list of all the medicines, herbs, non-prescription drugs, or dietary supplements you use. Also tell them if you smoke, drink alcohol, or use illegal drugs. Some items may interact with your medicine. What should I watch for while using this medication? Your condition will be monitored carefully while you are receiving this medication. You may need blood work while taking this medication. This medication may cause serious skin reactions. They can happen weeks to months after starting the medication. Contact your care team right away if you notice fevers or flu-like symptoms with a rash. The rash may be red or purple and then turn into blisters or peeling of   the skin. You may also notice a red rash with swelling of the face, lips, or lymph nodes in your neck or under your arms. Tell your care team right away if you have any change in your eyesight. Talk to your care team if you may be pregnant. Serious birth defects can occur if you take this medication during pregnancy and  for 3 months after the last dose. You will need a negative pregnancy test before starting this medication. Contraception is recommended while taking this medication and for 3 months after the last dose. Your care team can help you find the option that works for you. Do not breastfeed while taking this medication and for 3 months after the last dose. What side effects may I notice from receiving this medication? Side effects that you should report to your care team as soon as possible: Allergic reactions--skin rash, itching, hives, swelling of the face, lips, tongue, or throat Dry cough, shortness of breath or trouble breathing Eye pain, redness, irritation, or discharge with blurry or decreased vision Heart muscle inflammation--unusual weakness or fatigue, shortness of breath, chest pain, fast or irregular heartbeat, dizziness, swelling of the ankles, feet, or hands Hormone gland problems--headache, sensitivity to light, unusual weakness or fatigue, dizziness, fast or irregular heartbeat, increased sensitivity to cold or heat, excessive sweating, constipation, hair loss, increased thirst or amount of urine, tremors or shaking, irritability Infusion reactions--chest pain, shortness of breath or trouble breathing, feeling faint or lightheaded Kidney injury (glomerulonephritis)--decrease in the amount of urine, red or dark brown urine, foamy or bubbly urine, swelling of the ankles, hands, or feet Liver injury--right upper belly pain, loss of appetite, nausea, light-colored stool, dark yellow or brown urine, yellowing skin or eyes, unusual weakness or fatigue Pain, tingling, or numbness in the hands or feet, muscle weakness, change in vision, confusion or trouble speaking, loss of balance or coordination, trouble walking, seizures Rash, fever, and swollen lymph nodes Redness, blistering, peeling, or loosening of the skin, including inside the mouth Sudden or severe stomach pain, bloody diarrhea, fever,  nausea, vomiting Side effects that usually do not require medical attention (report these to your care team if they continue or are bothersome): Bone, joint, or muscle pain Diarrhea Fatigue Loss of appetite Nausea Skin rash This list may not describe all possible side effects. Call your doctor for medical advice about side effects. You may report side effects to FDA at 1-800-FDA-1088. Where should I keep my medication? This medication is given in a hospital or clinic. It will not be stored at home. NOTE: This sheet is a summary. It may not cover all possible information. If you have questions about this medicine, talk to your doctor, pharmacist, or health care provider.  2024 Elsevier/Gold Standard (2021-05-09 00:00:00)       To help prevent nausea and vomiting after your treatment, we encourage you to take your nausea medication as directed.  BELOW ARE SYMPTOMS THAT SHOULD BE REPORTED IMMEDIATELY: *FEVER GREATER THAN 100.4 F (38 C) OR HIGHER *CHILLS OR SWEATING *NAUSEA AND VOMITING THAT IS NOT CONTROLLED WITH YOUR NAUSEA MEDICATION *UNUSUAL SHORTNESS OF BREATH *UNUSUAL BRUISING OR BLEEDING *URINARY PROBLEMS (pain or burning when urinating, or frequent urination) *BOWEL PROBLEMS (unusual diarrhea, constipation, pain near the anus) TENDERNESS IN MOUTH AND THROAT WITH OR WITHOUT PRESENCE OF ULCERS (sore throat, sores in mouth, or a toothache) UNUSUAL RASH, SWELLING OR PAIN  UNUSUAL VAGINAL DISCHARGE OR ITCHING   Items with * indicate a potential emergency and should be followed   up as soon as possible or go to the Emergency Department if any problems should occur.  Please show the CHEMOTHERAPY ALERT CARD or IMMUNOTHERAPY ALERT CARD at check-in to the Emergency Department and triage nurse.  Should you have questions after your visit or need to cancel or reschedule your appointment, please contact [facility] [phone]  and follow the prompts.  Office hours are  8:00 a.m. to 4:30 p.m. Monday - Friday. Please note that voicemails left after 4:00 p.m. may not be returned until the following business day.  We are closed weekends and major holidays. You have access to a nurse at all times for urgent questions. Please call the main number to the clinic [phone] and follow the prompts.  For any non-urgent questions, you may also contact your provider using MyChart. We now offer e-Visits for anyone 18 and older to request care online for non-urgent symptoms. For details visit [email or web address].   Also download the MyChart app! Go to the app store, search "MyChart", open the app, select [facility], and log in with your MyChart username and password.   

## 2022-10-12 ENCOUNTER — Other Ambulatory Visit: Payer: Self-pay

## 2022-10-16 ENCOUNTER — Other Ambulatory Visit: Payer: Self-pay

## 2022-10-16 DIAGNOSIS — C3491 Malignant neoplasm of unspecified part of right bronchus or lung: Secondary | ICD-10-CM

## 2022-10-18 ENCOUNTER — Encounter: Payer: Self-pay | Admitting: Hematology

## 2022-10-23 ENCOUNTER — Other Ambulatory Visit: Payer: Self-pay

## 2022-10-25 ENCOUNTER — Inpatient Hospital Stay: Payer: Medicare Other

## 2022-10-25 VITALS — BP 147/84 | HR 94 | Temp 97.8°F | Resp 18

## 2022-10-25 DIAGNOSIS — Z95828 Presence of other vascular implants and grafts: Secondary | ICD-10-CM

## 2022-10-25 DIAGNOSIS — C3491 Malignant neoplasm of unspecified part of right bronchus or lung: Secondary | ICD-10-CM

## 2022-10-25 DIAGNOSIS — Z79899 Other long term (current) drug therapy: Secondary | ICD-10-CM | POA: Diagnosis not present

## 2022-10-25 DIAGNOSIS — C3411 Malignant neoplasm of upper lobe, right bronchus or lung: Secondary | ICD-10-CM | POA: Diagnosis not present

## 2022-10-25 DIAGNOSIS — J449 Chronic obstructive pulmonary disease, unspecified: Secondary | ICD-10-CM | POA: Diagnosis not present

## 2022-10-25 DIAGNOSIS — Z5112 Encounter for antineoplastic immunotherapy: Secondary | ICD-10-CM | POA: Diagnosis not present

## 2022-10-25 DIAGNOSIS — Z7982 Long term (current) use of aspirin: Secondary | ICD-10-CM | POA: Diagnosis not present

## 2022-10-25 DIAGNOSIS — F17210 Nicotine dependence, cigarettes, uncomplicated: Secondary | ICD-10-CM | POA: Diagnosis not present

## 2022-10-25 LAB — COMPREHENSIVE METABOLIC PANEL
ALT: 31 U/L (ref 0–44)
AST: 28 U/L (ref 15–41)
Albumin: 3.8 g/dL (ref 3.5–5.0)
Alkaline Phosphatase: 69 U/L (ref 38–126)
Anion gap: 10 (ref 5–15)
BUN: 7 mg/dL — ABNORMAL LOW (ref 8–23)
CO2: 22 mmol/L (ref 22–32)
Calcium: 8.5 mg/dL — ABNORMAL LOW (ref 8.9–10.3)
Chloride: 99 mmol/L (ref 98–111)
Creatinine, Ser: 0.81 mg/dL (ref 0.61–1.24)
GFR, Estimated: >60 mL/min (ref >60)
Glucose, Bld: 117 mg/dL — ABNORMAL HIGH (ref 70–99)
Potassium: 3.7 mmol/L (ref 3.5–5.1)
Sodium: 131 mmol/L — ABNORMAL LOW (ref 135–145)
Total Bilirubin: 0.6 mg/dL (ref 0.3–1.2)
Total Protein: 6.9 g/dL (ref 6.5–8.1)

## 2022-10-25 LAB — CBC WITH DIFFERENTIAL/PLATELET
Abs Immature Granulocytes: 0.05 10*3/uL (ref 0.00–0.07)
Basophils Absolute: 0.1 10*3/uL (ref 0.0–0.1)
Basophils Relative: 1 %
Eosinophils Absolute: 0.2 10*3/uL (ref 0.0–0.5)
Eosinophils Relative: 3 %
HCT: 42.1 % (ref 39.0–52.0)
Hemoglobin: 14.6 g/dL (ref 13.0–17.0)
Immature Granulocytes: 1 %
Lymphocytes Relative: 6 %
Lymphs Abs: 0.5 10*3/uL — ABNORMAL LOW (ref 0.7–4.0)
MCH: 33.3 pg (ref 26.0–34.0)
MCHC: 34.7 g/dL (ref 30.0–36.0)
MCV: 96.1 fL (ref 80.0–100.0)
Monocytes Absolute: 0.8 10*3/uL (ref 0.1–1.0)
Monocytes Relative: 11 %
Neutro Abs: 6.3 10*3/uL (ref 1.7–7.7)
Neutrophils Relative %: 78 %
Platelets: 246 10*3/uL (ref 150–400)
RBC: 4.38 MIL/uL (ref 4.22–5.81)
RDW: 12.9 % (ref 11.5–15.5)
WBC: 7.9 10*3/uL (ref 4.0–10.5)
nRBC: 0 % (ref 0.0–0.2)

## 2022-10-25 LAB — MAGNESIUM: Magnesium: 1.7 mg/dL (ref 1.7–2.4)

## 2022-10-25 MED ORDER — SODIUM CHLORIDE 0.9 % IV SOLN
10.0000 mg/kg | Freq: Once | INTRAVENOUS | Status: AC
  Administered 2022-10-25: 860 mg via INTRAVENOUS
  Filled 2022-10-25: qty 10

## 2022-10-25 MED ORDER — SODIUM CHLORIDE 0.9 % IV SOLN: Freq: Once | INTRAVENOUS | Status: AC

## 2022-10-25 MED ORDER — HEPARIN SOD (PORK) LOCK FLUSH 100 UNIT/ML IV SOLN
500.0000 [IU] | Freq: Once | INTRAVENOUS | Status: AC | PRN
  Administered 2022-10-25: 500 [IU]

## 2022-10-25 MED ORDER — SODIUM CHLORIDE 0.9% FLUSH
10.0000 mL | Freq: Once | INTRAVENOUS | Status: AC
  Administered 2022-10-25: 10 mL via INTRAVENOUS

## 2022-10-25 MED ORDER — SODIUM CHLORIDE 0.9% FLUSH
10.0000 mL | INTRAVENOUS | Status: DC | PRN
  Administered 2022-10-25: 10 mL

## 2022-10-25 NOTE — Progress Notes (Signed)
OK to proceed with HR 109  V.O. Dr Carilyn Goodpasture, PharmD

## 2022-10-25 NOTE — Patient Instructions (Signed)
[facility]  Discharge Instructions: Thank you for choosing [facility] to provide your oncology and hematology care.  If you have a lab appointment with the Cancer Center - please note that after April 8th, 2024, all labs will be drawn in the cancer center.  You do not have to check in or register with the main entrance as you have in the past but will complete your check-in in the cancer center.  Wear comfortable clothing and clothing appropriate for easy access to any Portacath or PICC line.   We strive to give you quality time with your provider. You may need to reschedule your appointment if you arrive late (15 or more minutes).  Arriving late affects you and other patients whose appointments are after yours.  Also, if you miss three or more appointments without notifying the office, you may be dismissed from the clinic at the provider's discretion.      For prescription refill requests, have your pharmacy contact our office and allow 72 hours for refills to be completed.    Today you received the following chemotherapy and/or immunotherapy agents Imfinzi.  Durvalumab Injection What is this medication? DURVALUMAB (dur VAL ue mab) treats some types of cancer. It works by helping your immune system slow or stop the spread of cancer cells. It is a monoclonal antibody. This medicine may be used for other purposes; ask your health care provider or pharmacist if you have questions. COMMON BRAND NAME(S): IMFINZI What should I tell my care team before I take this medication? They need to know if you have any of these conditions: Allogeneic stem cell transplant (uses someone else's stem cells) Autoimmune diseases, such as Crohn disease, ulcerative colitis, lupus History of chest radiation Nervous system problems, such as Guillain-Barre syndrome, myasthenia gravis Organ transplant An unusual or allergic reaction to durvalumab, other medications, foods, dyes, or  preservatives Pregnant or trying to get pregnant Breast-feeding How should I use this medication? This medication is infused into a vein. It is given by your care team in a hospital or clinic setting. A special MedGuide will be given to you before each treatment. Be sure to read this information carefully each time. Talk to your care team about the use of this medication in children. Special care may be needed. Overdosage: If you think you have taken too much of this medicine contact a poison control center or emergency room at once. NOTE: This medicine is only for you. Do not share this medicine with others. What if I miss a dose? Keep appointments for follow-up doses. It is important not to miss your dose. Call your care team if you are unable to keep an appointment. What may interact with this medication? Interactions have not been studied. This list may not describe all possible interactions. Give your health care provider a list of all the medicines, herbs, non-prescription drugs, or dietary supplements you use. Also tell them if you smoke, drink alcohol, or use illegal drugs. Some items may interact with your medicine. What should I watch for while using this medication? Your condition will be monitored carefully while you are receiving this medication. You may need blood work while taking this medication. This medication may cause serious skin reactions. They can happen weeks to months after starting the medication. Contact your care team right away if you notice fevers or flu-like symptoms with a rash. The rash may be red or purple and then turn into blisters or peeling of  the skin. You may also notice a red rash with swelling of the face, lips, or lymph nodes in your neck or under your arms. Tell your care team right away if you have any change in your eyesight. Talk to your care team if you may be pregnant. Serious birth defects can occur if you take this medication during pregnancy and  for 3 months after the last dose. You will need a negative pregnancy test before starting this medication. Contraception is recommended while taking this medication and for 3 months after the last dose. Your care team can help you find the option that works for you. Do not breastfeed while taking this medication and for 3 months after the last dose. What side effects may I notice from receiving this medication? Side effects that you should report to your care team as soon as possible: Allergic reactions--skin rash, itching, hives, swelling of the face, lips, tongue, or throat Dry cough, shortness of breath or trouble breathing Eye pain, redness, irritation, or discharge with blurry or decreased vision Heart muscle inflammation--unusual weakness or fatigue, shortness of breath, chest pain, fast or irregular heartbeat, dizziness, swelling of the ankles, feet, or hands Hormone gland problems--headache, sensitivity to light, unusual weakness or fatigue, dizziness, fast or irregular heartbeat, increased sensitivity to cold or heat, excessive sweating, constipation, hair loss, increased thirst or amount of urine, tremors or shaking, irritability Infusion reactions--chest pain, shortness of breath or trouble breathing, feeling faint or lightheaded Kidney injury (glomerulonephritis)--decrease in the amount of urine, red or dark Carlos [name] urine, foamy or bubbly urine, swelling of the ankles, hands, or feet Liver injury--right upper belly pain, loss of appetite, nausea, light-colored stool, dark yellow or Carlos [name] urine, yellowing skin or eyes, unusual weakness or fatigue Pain, tingling, or numbness in the hands or feet, muscle weakness, change in vision, confusion or trouble speaking, loss of balance or coordination, trouble walking, seizures Rash, fever, and swollen lymph nodes Redness, blistering, peeling, or loosening of the skin, including inside the mouth Sudden or severe stomach pain, bloody diarrhea, fever,  nausea, vomiting Side effects that usually do not require medical attention (report these to your care team if they continue or are bothersome): Bone, joint, or muscle pain Diarrhea Fatigue Loss of appetite Nausea Skin rash This list may not describe all possible side effects. Call your doctor for medical advice about side effects. You may report side effects to FDA at 1-800-FDA-1088. Where should I keep my medication? This medication is given in a hospital or clinic. It will not be stored at home. NOTE: This sheet is a summary. It may not cover all possible information. If you have questions about this medicine, talk to your doctor, pharmacist, or health care provider.  2024 Elsevier/Gold Standard (2021-05-09 00:00:00)       To help prevent nausea and vomiting after your treatment, we encourage you to take your nausea medication as directed.  BELOW ARE SYMPTOMS THAT SHOULD BE REPORTED IMMEDIATELY: *FEVER GREATER THAN 100.4 F (38 C) OR HIGHER *CHILLS OR SWEATING *NAUSEA AND VOMITING THAT IS NOT CONTROLLED WITH YOUR NAUSEA MEDICATION *UNUSUAL SHORTNESS OF BREATH *UNUSUAL BRUISING OR BLEEDING *URINARY PROBLEMS (pain or burning when urinating, or frequent urination) *BOWEL PROBLEMS (unusual diarrhea, constipation, pain near the anus) TENDERNESS IN MOUTH AND THROAT WITH OR WITHOUT PRESENCE OF ULCERS (sore throat, sores in mouth, or a toothache) UNUSUAL RASH, SWELLING OR PAIN  UNUSUAL VAGINAL DISCHARGE OR ITCHING   Items with * indicate a potential emergency and should be followed  up as soon as possible or go to the Emergency Department if any problems should occur.  Please show the CHEMOTHERAPY ALERT CARD or IMMUNOTHERAPY ALERT CARD at check-in to the Emergency Department and triage nurse.  Should you have questions after your visit or need to cancel or reschedule your appointment, please contact [facility] [phone]  and follow the prompts.  Office hours are  8:00 a.m. to 4:30 p.m. Monday - Friday. Please note that voicemails left after 4:00 p.m. may not be returned until the following business day.  We are closed weekends and major holidays. You have access to a nurse at all times for urgent questions. Please call the main number to the clinic [phone] and follow the prompts.  For any non-urgent questions, you may also contact your provider using MyChart. We now offer e-Visits for anyone 51 and older to request care online for non-urgent symptoms. For details visit [email or web address].   Also download the MyChart app! Go to the app store, search "MyChart", open the app, select [place], and log in with your MyChart username and password.

## 2022-10-25 NOTE — Progress Notes (Signed)
Patient presents today for chemotherapy infusion.  Patient is in satisfactory condition with no new complaints voiced.  Vital signs are stable.  Labs reviewed and all labs are within treatment parameters.  We will proceed with treatment per MD orders.    Patient tolerated treatment well with no complaints voiced.  Patient left ambulatory in stable condition.  Vital signs stable at discharge.  Follow up as scheduled.

## 2022-11-08 ENCOUNTER — Inpatient Hospital Stay: Payer: Medicare Other

## 2022-11-08 VITALS — BP 133/71 | HR 78 | Temp 98.7°F | Resp 20

## 2022-11-08 DIAGNOSIS — Z79899 Other long term (current) drug therapy: Secondary | ICD-10-CM | POA: Diagnosis not present

## 2022-11-08 DIAGNOSIS — Z5112 Encounter for antineoplastic immunotherapy: Secondary | ICD-10-CM | POA: Diagnosis not present

## 2022-11-08 DIAGNOSIS — C3411 Malignant neoplasm of upper lobe, right bronchus or lung: Secondary | ICD-10-CM | POA: Diagnosis not present

## 2022-11-08 DIAGNOSIS — R197 Diarrhea, unspecified: Secondary | ICD-10-CM

## 2022-11-08 DIAGNOSIS — C3491 Malignant neoplasm of unspecified part of right bronchus or lung: Secondary | ICD-10-CM

## 2022-11-08 DIAGNOSIS — Z95828 Presence of other vascular implants and grafts: Secondary | ICD-10-CM

## 2022-11-08 DIAGNOSIS — F17210 Nicotine dependence, cigarettes, uncomplicated: Secondary | ICD-10-CM | POA: Diagnosis not present

## 2022-11-08 DIAGNOSIS — R918 Other nonspecific abnormal finding of lung field: Secondary | ICD-10-CM

## 2022-11-08 DIAGNOSIS — Z7982 Long term (current) use of aspirin: Secondary | ICD-10-CM | POA: Diagnosis not present

## 2022-11-08 DIAGNOSIS — J449 Chronic obstructive pulmonary disease, unspecified: Secondary | ICD-10-CM | POA: Diagnosis not present

## 2022-11-08 LAB — CBC WITH DIFFERENTIAL/PLATELET
Abs Immature Granulocytes: 0.04 10*3/uL (ref 0.00–0.07)
Basophils Absolute: 0.1 10*3/uL (ref 0.0–0.1)
Basophils Relative: 1 %
Eosinophils Absolute: 0.2 10*3/uL (ref 0.0–0.5)
Eosinophils Relative: 2 %
HCT: 40.4 % (ref 39.0–52.0)
Hemoglobin: 14.1 g/dL (ref 13.0–17.0)
Immature Granulocytes: 1 %
Lymphocytes Relative: 6 %
Lymphs Abs: 0.4 10*3/uL — ABNORMAL LOW (ref 0.7–4.0)
MCH: 33.5 pg (ref 26.0–34.0)
MCHC: 34.9 g/dL (ref 30.0–36.0)
MCV: 96 fL (ref 80.0–100.0)
Monocytes Absolute: 0.9 10*3/uL (ref 0.1–1.0)
Monocytes Relative: 13 %
Neutro Abs: 5.2 10*3/uL (ref 1.7–7.7)
Neutrophils Relative %: 77 %
Platelets: 227 10*3/uL (ref 150–400)
RBC: 4.21 MIL/uL — ABNORMAL LOW (ref 4.22–5.81)
RDW: 13 % (ref 11.5–15.5)
WBC: 6.8 10*3/uL (ref 4.0–10.5)
nRBC: 0 % (ref 0.0–0.2)

## 2022-11-08 LAB — COMPREHENSIVE METABOLIC PANEL
ALT: 83 U/L — ABNORMAL HIGH (ref 0–44)
AST: 56 U/L — ABNORMAL HIGH (ref 15–41)
Albumin: 3.8 g/dL (ref 3.5–5.0)
Alkaline Phosphatase: 63 U/L (ref 38–126)
Anion gap: 10 (ref 5–15)
BUN: 8 mg/dL (ref 8–23)
CO2: 22 mmol/L (ref 22–32)
Calcium: 8.9 mg/dL (ref 8.9–10.3)
Chloride: 98 mmol/L (ref 98–111)
Creatinine, Ser: 0.87 mg/dL (ref 0.61–1.24)
GFR, Estimated: >60 mL/min (ref >60)
Glucose, Bld: 96 mg/dL (ref 70–99)
Potassium: 3.8 mmol/L (ref 3.5–5.1)
Sodium: 130 mmol/L — ABNORMAL LOW (ref 135–145)
Total Bilirubin: 0.6 mg/dL (ref 0.3–1.2)
Total Protein: 6.9 g/dL (ref 6.5–8.1)

## 2022-11-08 LAB — MAGNESIUM: Magnesium: 1.6 mg/dL — ABNORMAL LOW (ref 1.7–2.4)

## 2022-11-08 MED ORDER — SODIUM CHLORIDE 0.9 % IV SOLN
10.0000 mg/kg | Freq: Once | INTRAVENOUS | Status: AC
  Administered 2022-11-08: 860 mg via INTRAVENOUS
  Filled 2022-11-08: qty 10

## 2022-11-08 MED ORDER — HEPARIN SOD (PORK) LOCK FLUSH 100 UNIT/ML IV SOLN
500.0000 [IU] | Freq: Once | INTRAVENOUS | Status: AC | PRN
  Administered 2022-11-08: 500 [IU]

## 2022-11-08 MED ORDER — MAGNESIUM OXIDE 400 MG PO TABS
400.0000 mg | ORAL_TABLET | Freq: Once | ORAL | Status: AC
  Administered 2022-11-08: 400 mg via ORAL
  Filled 2022-11-08: qty 1

## 2022-11-08 MED ORDER — LOPERAMIDE HCL 2 MG PO CAPS: 2.0000 mg | ORAL_CAPSULE | ORAL | 1 refills | Status: DC | PRN

## 2022-11-08 MED ORDER — SODIUM CHLORIDE 0.9 % IV SOLN
Freq: Once | INTRAVENOUS | Status: AC
Start: 2022-11-08 — End: 2022-11-08

## 2022-11-08 MED ORDER — SODIUM CHLORIDE 0.9% FLUSH
10.0000 mL | INTRAVENOUS | Status: DC | PRN
  Administered 2022-11-08: 10 mL via INTRAVENOUS

## 2022-11-08 MED ORDER — SODIUM CHLORIDE 0.9% FLUSH
10.0000 mL | INTRAVENOUS | Status: DC | PRN
  Administered 2022-11-08: 10 mL

## 2022-11-08 NOTE — Progress Notes (Signed)
Patient ALT 83 patient okay for treatment per Dr. Ellin Saba. Patient's magnesium 1.6 Per Dr. Ellin Saba patient okay to receive 400mg  Mag PO, and monitor Magnesium in 2 weeks, patient made aware and thinks he has magnesium at home. Patient tolerated therapy with no complaints voiced. Side effects with management reviewed with understanding verbalized. Port site clean and dry with no bruising or swelling noted at site. Good blood return noted before and after administration of therapy. Band aid applied. Patient left in satisfactory condition with VSS and no s/s of distress noted.

## 2022-11-08 NOTE — Progress Notes (Signed)
OK to treat with elevated LFT's  T.O. Dr Carilyn Goodpasture, PharmD

## 2022-11-08 NOTE — Patient Instructions (Signed)
[facility]  Discharge Instructions: Thank you for choosing [facility] to provide your oncology and hematology care.  If you have a lab appointment with the Cancer Center - please note that after April 8th, 2024, all labs will be drawn in the cancer center.  You do not have to check in or register with the main entrance as you have in the past but will complete your check-in in the cancer center.  Wear comfortable clothing and clothing appropriate for easy access to any Portacath or PICC line.   We strive to give you quality time with your provider. You may need to reschedule your appointment if you arrive late (15 or more minutes).  Arriving late affects you and other patients whose appointments are after yours.  Also, if you miss three or more appointments without notifying the office, you may be dismissed from the clinic at the provider's discretion.      For prescription refill requests, have your pharmacy contact our office and allow 72 hours for refills to be completed.    Today you received the following chemotherapy and/or immunotherapy agents Imfinzi, return as scheduled.   To help prevent nausea and vomiting after your treatment, we encourage you to take your nausea medication as directed.  BELOW ARE SYMPTOMS THAT SHOULD BE REPORTED IMMEDIATELY: *FEVER GREATER THAN 100.4 F (38 C) OR HIGHER *CHILLS OR SWEATING *NAUSEA AND VOMITING THAT IS NOT CONTROLLED WITH YOUR NAUSEA MEDICATION *UNUSUAL SHORTNESS OF BREATH *UNUSUAL BRUISING OR BLEEDING *URINARY PROBLEMS (pain or burning when urinating, or frequent urination) *BOWEL PROBLEMS (unusual diarrhea, constipation, pain near the anus) TENDERNESS IN MOUTH AND THROAT WITH OR WITHOUT PRESENCE OF ULCERS (sore throat, sores in mouth, or a toothache) UNUSUAL RASH, SWELLING OR PAIN  UNUSUAL VAGINAL DISCHARGE OR ITCHING   Items with * indicate a potential emergency and should be followed up as soon as  possible or go to the Emergency Department if any problems should occur.  Please show the CHEMOTHERAPY ALERT CARD or IMMUNOTHERAPY ALERT CARD at check-in to the Emergency Department and triage nurse.  Should you have questions after your visit or need to cancel or reschedule your appointment, please contact [facility] [phone]  and follow the prompts.  Office hours are 8:00 a.m. to 4:30 p.m. Monday - Friday. Please note that voicemails left after 4:00 p.m. may not be returned until the following business day.  We are closed weekends and major holidays. You have access to a nurse at all times for urgent questions. Please call the main number to the clinic [phone] and follow the prompts.  For any non-urgent questions, you may also contact your provider using MyChart. We now offer e-Visits for anyone 68 and older to request care online for non-urgent symptoms. For details visit [email or web address].   Also download the MyChart app! Go to the app store, search "MyChart", open the app, select [place], and log in with your MyChart username and password.

## 2022-11-08 NOTE — Progress Notes (Signed)
Patients port flushed without difficulty.  Good blood return noted with no bruising or swelling noted at site.  Patient remains accessed for treatment.  

## 2022-11-22 ENCOUNTER — Inpatient Hospital Stay: Payer: Medicare Other | Attending: Hematology | Admitting: Hematology

## 2022-11-22 ENCOUNTER — Inpatient Hospital Stay: Payer: Medicare Other

## 2022-11-22 VITALS — BP 128/73 | HR 84 | Temp 97.8°F | Resp 18

## 2022-11-22 VITALS — Wt 185.0 lb

## 2022-11-22 DIAGNOSIS — Z79899 Other long term (current) drug therapy: Secondary | ICD-10-CM | POA: Insufficient documentation

## 2022-11-22 DIAGNOSIS — F419 Anxiety disorder, unspecified: Secondary | ICD-10-CM | POA: Diagnosis not present

## 2022-11-22 DIAGNOSIS — Z95828 Presence of other vascular implants and grafts: Secondary | ICD-10-CM

## 2022-11-22 DIAGNOSIS — J449 Chronic obstructive pulmonary disease, unspecified: Secondary | ICD-10-CM | POA: Insufficient documentation

## 2022-11-22 DIAGNOSIS — C3491 Malignant neoplasm of unspecified part of right bronchus or lung: Secondary | ICD-10-CM

## 2022-11-22 DIAGNOSIS — Z5112 Encounter for antineoplastic immunotherapy: Secondary | ICD-10-CM | POA: Diagnosis not present

## 2022-11-22 DIAGNOSIS — F32A Depression, unspecified: Secondary | ICD-10-CM | POA: Insufficient documentation

## 2022-11-22 DIAGNOSIS — C3411 Malignant neoplasm of upper lobe, right bronchus or lung: Secondary | ICD-10-CM | POA: Insufficient documentation

## 2022-11-22 LAB — COMPREHENSIVE METABOLIC PANEL
ALT: 53 U/L — ABNORMAL HIGH (ref 0–44)
AST: 39 U/L (ref 15–41)
Albumin: 3.9 g/dL (ref 3.5–5.0)
Alkaline Phosphatase: 65 U/L (ref 38–126)
Anion gap: 11 (ref 5–15)
BUN: 7 mg/dL — ABNORMAL LOW (ref 8–23)
CO2: 22 mmol/L (ref 22–32)
Calcium: 8.8 mg/dL — ABNORMAL LOW (ref 8.9–10.3)
Chloride: 98 mmol/L (ref 98–111)
Creatinine, Ser: 0.85 mg/dL (ref 0.61–1.24)
GFR, Estimated: >60 mL/min (ref >60)
Glucose, Bld: 95 mg/dL (ref 70–99)
Potassium: 4.2 mmol/L (ref 3.5–5.1)
Sodium: 131 mmol/L — ABNORMAL LOW (ref 135–145)
Total Bilirubin: 0.8 mg/dL (ref <1.2)
Total Protein: 6.9 g/dL (ref 6.5–8.1)

## 2022-11-22 LAB — CBC WITH DIFFERENTIAL/PLATELET
Abs Immature Granulocytes: 0.06 10*3/uL (ref 0.00–0.07)
Basophils Absolute: 0 10*3/uL (ref 0.0–0.1)
Basophils Relative: 1 %
Eosinophils Absolute: 0.3 10*3/uL (ref 0.0–0.5)
Eosinophils Relative: 3 %
HCT: 44.1 % (ref 39.0–52.0)
Hemoglobin: 15.3 g/dL (ref 13.0–17.0)
Immature Granulocytes: 1 %
Lymphocytes Relative: 6 %
Lymphs Abs: 0.5 10*3/uL — ABNORMAL LOW (ref 0.7–4.0)
MCH: 33 pg (ref 26.0–34.0)
MCHC: 34.7 g/dL (ref 30.0–36.0)
MCV: 95 fL (ref 80.0–100.0)
Monocytes Absolute: 1 10*3/uL (ref 0.1–1.0)
Monocytes Relative: 13 %
Neutro Abs: 6.1 10*3/uL (ref 1.7–7.7)
Neutrophils Relative %: 76 %
Platelets: 277 10*3/uL (ref 150–400)
RBC: 4.64 MIL/uL (ref 4.22–5.81)
RDW: 13.2 % (ref 11.5–15.5)
WBC: 8 10*3/uL (ref 4.0–10.5)
nRBC: 0 % (ref 0.0–0.2)

## 2022-11-22 LAB — TSH: TSH: 1.631 u[IU]/mL (ref 0.350–4.500)

## 2022-11-22 LAB — MAGNESIUM: Magnesium: 1.9 mg/dL (ref 1.7–2.4)

## 2022-11-22 MED ORDER — ALBUTEROL SULFATE HFA 108 (90 BASE) MCG/ACT IN AERS: 2.0000 | INHALATION_SPRAY | Freq: Four times a day (QID) | RESPIRATORY_TRACT | 6 refills | Status: DC | PRN

## 2022-11-22 MED ORDER — HEPARIN SOD (PORK) LOCK FLUSH 100 UNIT/ML IV SOLN
500.0000 [IU] | Freq: Once | INTRAVENOUS | Status: AC | PRN
  Administered 2022-11-22: 500 [IU]

## 2022-11-22 MED ORDER — SODIUM CHLORIDE 0.9 % IV SOLN: Freq: Once | INTRAVENOUS | Status: AC

## 2022-11-22 MED ORDER — SODIUM CHLORIDE 0.9% FLUSH
10.0000 mL | INTRAVENOUS | Status: DC | PRN
  Administered 2022-11-22: 10 mL

## 2022-11-22 MED ORDER — LIDOCAINE-PRILOCAINE 2.5-2.5 % EX CREA: TOPICAL_CREAM | CUTANEOUS | 3 refills | Status: DC

## 2022-11-22 MED ORDER — SODIUM CHLORIDE 0.9 % IV SOLN
1500.0000 mg | Freq: Once | INTRAVENOUS | Status: AC
  Administered 2022-11-22: 1500 mg via INTRAVENOUS
  Filled 2022-11-22: qty 30

## 2022-11-22 MED ORDER — SODIUM CHLORIDE 0.9% FLUSH
10.0000 mL | Freq: Once | INTRAVENOUS | Status: AC
  Administered 2022-11-22: 10 mL via INTRAVENOUS

## 2022-11-22 NOTE — Patient Instructions (Signed)

## 2022-11-22 NOTE — Progress Notes (Signed)
[facility] [address] [place] [address]    Clinic Day:  11/22/22   Referring physician: Benita Stabile, [name]  Patient Care Team: Carlos Stabile, [name] as PCP - General (Internal Medicine) Carlos [name] as Medical Oncologist (Medical Oncology) Carlos Sarah, RN as Oncology Nurse Navigator (Medical Oncology)   ASSESSMENT & PLAN:   Assessment: 1.  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  No hemoptysis/weight loss. - Chest x-ray (04/10/2022): Mid right lung mass. - CT chest (04/13/2022): 9.2 x 5.7 x 6.6 cm right upper lobe mass with lobulations appearing to extend across the minor fissure into the right middle lobe and right lower lobe.  Satellite lesion in the right upper lobe measures 1.6 x 1.4 x 1.4 cm.  No findings of chest wall invasion.  Borderline prominent lower right paratracheal lymph node 0.9 cm.  Old granulomatous disease.  Small left adrenal adenoma. - Has right posterior chest wall pain for the past 6 months, dull type.  He is not requiring any pain medication. - Brain MRI (04/19/2022): No metastatic disease. - PET scan (04/19/2022): 9 x 5.7 cm right upper lobe mass with SUV 11.8.  1.3 x 0.9 cm satellite nodule in the right upper lobe with SUV 3.8.  No adenopathy or distant metastatic disease. - Bronchoscopy and biopsy by Carlos [name] - Pathology (04/23/2022): Both right upper lobe lung nodule FNA consistent with poorly differentiated carcinoma.  IHC diffusely positive for TTF-1, Napsin, synaptophysin and CK7.  CD56 negative.  P63 and CK5/6 negative.  Given Napsin positivity, favor adenocarcinoma.  There is also diffuse synaptophysin positivity consistent with neuroendocrine differentiation.  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 - Discussed with Dr. Dorris [name].  Not a surgical candidate as he requires pneumonectomy and has  suboptimal DLCO. - Chemoradiation with carboplatin and paclitaxel from 05/22/2022 through 07/06/2022 - Consolidation durvalumab started on 07/27/2022   2.  Social/family history: - He is seen with his 2 sisters today.  Lives by himself and is independent of ADLs and IADLs.  He retired after working at [employer] for the last 34 years.  No asbestos exposure.  No other chemicals.  Smokes more than 1 pack/day for the last 50 years (started at age 70) quit smoking for 5 years before starting back again. - Brother died of lung cancer.  Father had bladder cancer.  Maternal uncle had cancer.    Plan: 1.  Stage IIIa adenocarcinoma of the right lung: - He reports watery stools up to 3 times per day.  Denies any skin rashes. - Reviewed labs: ALT minimally elevated at 53 but trending down.  Rest of LFTs are normal.  CBC was normal.  TSH is 1.6. - We have again discussed about consolidation immunotherapy, to be continued for a year if there is no evidence of progression on periodic imaging. - I will change Imfinzi to every 4-week dosing at 1500 mg. - I will see him back in 8 weeks for follow-up with repeat CT of the chest with contrast.   2.  Anxiety/depression: - Continue clonazepam 1 mg twice daily which is helping.   3.  COPD: - Take Tessalon Perles twice daily as needed.    Orders Placed This Encounter  Procedures   CT CHEST W CONTRAST    Standing Status:   Future    Standing Expiration Date:  11/22/2023    Order Specific Question:   If indicated for the ordered procedure, I authorize the administration of contrast media per Radiology protocol    Answer:   Yes    Order Specific Question:   Does the patient have a contrast media/X-ray dye allergy?    Answer:   No    Order Specific Question:   Preferred imaging location?    Answer:   [facility]   CBC with Differential    Standing Status:   Future    Standing Expiration Date:   01/17/2024   Comprehensive metabolic panel     Standing Status:   Future    Standing Expiration Date:   01/17/2024   T4    Standing Status:   Future    Standing Expiration Date:   01/17/2024   TSH    Standing Status:   Future    Standing Expiration Date:   01/17/2024   CBC with Differential    Standing Status:   Future    Standing Expiration Date:   02/14/2024   Comprehensive metabolic panel    Standing Status:   Future    Standing Expiration Date:   02/14/2024   T4    Standing Status:   Future    Standing Expiration Date:   02/14/2024   TSH    Standing Status:   Future    Standing Expiration Date:   02/14/2024      Carlos [name],acting as a scribe for Carlos [name].,have documented all relevant documentation on the behalf of Carlos [name],as directed by  Carlos [name] while in the presence of Carlos [name].  I, Carlos Massed [name], have reviewed the above documentation for accuracy and completeness, and I agree with the above.       Carlos [name]   11/14/20242:50 PM  CHIEF COMPLAINT:   Diagnosis: RUL lung adenocarcinoma    Cancer Staging  Non-small cell cancer of right lung [facility]) Staging form: Lung, AJCC 8th Edition - Clinical stage from 05/01/2022: Stage IIIA (cT4, cN0, cM0) - Unsigned    Prior Therapy: none  Current Therapy:  concurrent chemoradiation with carboplatin + paclitaxel    HISTORY OF PRESENT ILLNESS:   Oncology History  Non-small cell cancer of right lung (HCC)  05/01/2022 Initial Diagnosis   Non-small cell cancer of right lung (HCC)   05/22/2022 - 07/05/2022 Chemotherapy   Patient is on Treatment Plan : LUNG Carboplatin + Paclitaxel + XRT q7d     07/27/2022 -  Chemotherapy   Patient is on Treatment Plan : LUNG Durvalumab (10) q14d        INTERVAL HISTORY:   Carlos [name] is a 67 y.o. male presenting to clinic today for follow up of RUL lung adenocarcinoma. He was last seen by me on 10/11/22.  Today, he states that he is doing well overall. His  appetite level is at 50%. His energy level is at 50%. He is accompanied by his wife and family member. He reports back pain is stable. He c/o diarrhea with watery stools 3-9x a day, though it has slightly improved recently. Diarrhea is worsened with greasy foods. He is taking imodium without resolve of symptoms. He denies any skin rashes.   PAST MEDICAL HISTORY:   Past Medical History: Past Medical History:  Diagnosis Date   Anxiety    Cancer (HCC)    Skin cancer left shoulder   Chronic low back pain    Complication of anesthesia  Dermatofibrosarcoma protubera of right shoulder    Dyspnea    GERD (gastroesophageal reflux disease)    Hyperlipidemia    Lung cancer (HCC) 04/23/2022   PONV (postoperative nausea and vomiting)    Port-A-Cath in place 05/17/2022   Prostate cancer [facility])     Surgical History: Past Surgical History:  Procedure Laterality Date   APPENDECTOMY     APPLICATION OF A-CELL OF EXTREMITY Right 07/09/2012   Procedure: PLACEMENT OF A-CELL TO UPPER EXTREMITY/PLACEMENT OF VAC;  Surgeon: Wayland Denis, DO;  Location: [facility];  Service: Plastics;  Laterality: Right;   BRONCHIAL BRUSHINGS  04/23/2022   Procedure: BRONCHIAL BRUSHINGS;  Surgeon: Leslye Peer, [name];  Location: [facility] ENDOSCOPY;  Service: Pulmonary;;   BRONCHIAL NEEDLE ASPIRATION BIOPSY  04/23/2022   Procedure: BRONCHIAL NEEDLE ASPIRATION BIOPSIES;  Surgeon: Leslye Peer, [name];  Location: [facility] ENDOSCOPY;  Service: Pulmonary;;   COLONOSCOPY WITH PROPOFOL N/A 09/30/2015   Procedure: COLONOSCOPY WITH PROPOFOL;  Surgeon: Malissa Hippo, [name];  Location: [facility];  Service: Endoscopy;  Laterality: N/A;  830   EXCISION DERMATOFIBROCARCOMA PROTUBERAN RIGHT SHOULDER  2 - 3 WKS AGO   HEMOSTASIS CONTROL  04/23/2022   Procedure: HEMOSTASIS CONTROL;  Surgeon: Leslye Peer, [name];  Location: [facility] ENDOSCOPY;  Service: Pulmonary;;   INGUINAL HERNIA REPAIR Left 1999   IR IMAGING GUIDED PORT INSERTION  04/30/2022    KNEE ARTHROSCOPY Left 2012   POLYPECTOMY  09/30/2015   Procedure: POLYPECTOMY;  Surgeon: Malissa Hippo, [name];  Location: [facility];  Service: Endoscopy;;  sigmoid   VIDEO BRONCHOSCOPY WITH ENDOBRONCHIAL ULTRASOUND Bilateral 04/23/2022   Procedure: VIDEO BRONCHOSCOPY WITH ENDOBRONCHIAL ULTRASOUND;  Surgeon: Leslye Peer, [name];  Location: [facility] ENDOSCOPY;  Service: Pulmonary;  Laterality: Bilateral;   VIDEO BRONCHOSCOPY WITH RADIAL ENDOBRONCHIAL ULTRASOUND  04/23/2022   Procedure: VIDEO BRONCHOSCOPY WITH RADIAL ENDOBRONCHIAL ULTRASOUND;  Surgeon: Leslye Peer, [name];  Location: [facility] ENDOSCOPY;  Service: Pulmonary;;    Social History: Social History   Socioeconomic History   Marital status: Divorced    Spouse name: Burna Mortimer   Number of children: 1   Years of education: 10th   Highest education level: Not on file  Occupational History    Employer: [employer]    Comment: [employer]  Tobacco Use   Smoking status: Every Day    Current packs/day: 1.00    Average packs/day: 1 pack/day for 40.0 years (40.0 ttl pk-yrs)    Types: Cigarettes   Smokeless tobacco: Never  Vaping Use   Vaping status: Never Used  Substance and Sexual Activity   Alcohol use: Yes    Comment: 2-3 cans beer/day   Drug use: No   Sexual activity: Not on file  Other Topics Concern   Not on file  Social History Narrative   Patient lives at home with his dog. 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 Patient has 10 grade education.Patient is right-handed.Caffeine Use: 1 cup of coffee; 3-4 sodas daily   Social Determinants of Health   Financial Resource Strain: High Risk (05/10/2022)   Overall Financial Resource Strain (CARDIA)    Difficulty of Paying Living Expenses: Very hard  Food Insecurity: Food Insecurity Present (05/04/2022)   Hunger Vital Sign    Worried About Running Out of Food in the Last Year: Sometimes true    Ran Out of Food in the Last Year: Sometimes true   Transportation Needs: No Transportation Needs (05/04/2022)   PRAPARE -  Administrator, Civil Service (Medical): No    Lack of Transportation (Non-Medical): No  Physical Activity: Not on file  Stress: Not on file  Social Connections: Not on file  Intimate Partner Violence: Not At Risk (05/04/2022)   Humiliation, Afraid, Rape, and Kick questionnaire    Fear of Current or Ex-Partner: No    Emotionally Abused: No    Physically Abused: No    Sexually Abused: No    Family History: Family History  Problem Relation Age of Onset   Dementia Mother    Cancer Father     Current Medications:  Current Outpatient Medications:    acetaminophen (TYLENOL) 500 MG tablet, Take 1,000 mg by mouth every 6 (six) hours as needed (pain.)., Disp: , Rfl:    aspirin EC 81 MG tablet, Take 81 mg by mouth at bedtime. Swallow whole., Disp: , Rfl:    benzonatate (TESSALON) 200 MG capsule, Take 1 capsule (200 mg total) by mouth 3 (three) times daily as needed for cough., Disp: 60 capsule, Rfl: 1   clonazePAM (KLONOPIN) 1 MG disintegrating tablet, Take one tablet by mouth every morning and two tablets by mouth at bedtime, Disp: 90 tablet, Rfl: 2   Durvalumab (IMFINZI IV), Inject into the vein every 14 (fourteen) days., Disp: , Rfl:    loperamide (IMODIUM) 2 MG capsule, Take 1 capsule (2 mg total) by mouth as needed for diarrhea or loose stools (Take 2 capsules after the first loose stool and then 1 capsule after each loose stool bt do not exceed more than 8 capsules in a 24 hr period. If it is bedtime take 2 capsules at bedtime and then take 2 capsules every 4 hours until morning)., Disp: 30 capsule, Rfl: 1   meloxicam (MOBIC) 15 MG tablet, Take 15 mg by mouth in the morning., Disp: , Rfl:    pantoprazole (PROTONIX) 40 MG tablet, Take 40 mg by mouth daily., Disp: , Rfl:    prochlorperazine (COMPAZINE) 10 MG tablet, Take 10 mg by mouth every 6 (six) hours as needed., Disp: , Rfl:    simvastatin (ZOCOR) 20 MG  tablet, Take 20 mg by mouth at bedtime., Disp: , Rfl: 2   sucralfate (CARAFATE) 1 g tablet, Take 1 tablet (1 g total) by mouth 4 (four) times daily. Dissolve each tablet in 15 cc water before use., Disp: 120 tablet, Rfl: 2   tamsulosin (FLOMAX) 0.4 MG CAPS capsule, Take 1 capsule (0.4 mg total) by mouth daily. (Patient taking differently: Take 0.4 mg by mouth at bedtime.), Disp: 30 capsule, Rfl: 11   albuterol (VENTOLIN HFA) 108 (90 Base) MCG/ACT inhaler, Inhale 2 puffs into the lungs every 6 (six) hours as needed for wheezing or shortness of breath., Disp: 8 g, Rfl: 6   lidocaine-prilocaine (EMLA) cream, Apply a quarter-sized amount to port a cath site and cover with plastic wrap 1 hour prior to infusion appointments, Disp: 30 g, Rfl: 3 No current facility-administered medications for this visit.  Facility-Administered Medications Ordered in Other Visits:    durvalumab (IMFINZI) 1,500 mg in sodium chloride 0.9 % 100 mL chemo infusion, 1,500 mg, Intravenous, Once, Carlos [name], Last Rate: 130 mL/hr at 11/22/22 1432, 1,500 mg at 11/22/22 1432   heparin lock flush 100 unit/mL, 500 Units, Intracatheter, Once PRN, Carlos [name]   sodium chloride flush (NS) 0.9 % injection 10 mL, 10 mL, Intracatheter, PRN, Carlos [name]   Allergies: Allergies  Allergen Reactions   Ciprofloxacin Swelling and Other (  See Comments)    Swelling in hands, numbness in arms and joint pain   Gabapentin Other (See Comments)    Double vision    REVIEW OF SYSTEMS:   Review of Systems  Constitutional:  Negative for chills, fatigue and fever.  HENT:   Negative for lump/mass, mouth sores, nosebleeds, sore throat and trouble swallowing.   Eyes:  Negative for eye problems.  Respiratory:  Positive for cough and shortness of breath.   Cardiovascular:  Negative for chest pain, leg swelling and palpitations.  Gastrointestinal:  Positive for diarrhea. Negative for abdominal pain, constipation,  nausea and vomiting.  Genitourinary:  Negative for bladder incontinence, difficulty urinating, dysuria, frequency, hematuria and nocturia.   Musculoskeletal:  Positive for back pain (6/10 severity). Negative for arthralgias, flank pain, myalgias and neck pain.  Skin:  Negative for itching and rash.  Neurological:  Positive for dizziness. Negative for headaches and numbness.  Hematological:  Does not bruise/bleed easily.  Psychiatric/Behavioral:  Negative for depression, sleep disturbance and suicidal ideas. The patient is not nervous/anxious.   All other systems reviewed and are negative.    VITALS:   Weight 185 lb (83.9 kg).  Wt Readings from Last 3 Encounters:  11/22/22 185 lb (83.9 kg)  11/08/22 185 lb 6.4 oz (84.1 kg)  10/25/22 187 lb (84.8 kg)    Body mass index is 29.86 kg/m.  Performance status (ECOG): 1 - Symptomatic but completely ambulatory  PHYSICAL EXAM:   Physical Exam Vitals and nursing note reviewed. Exam conducted with a chaperone present.  Constitutional:      Appearance: Normal appearance.  Cardiovascular:     Rate and Rhythm: Normal rate and regular rhythm.     Pulses: Normal pulses.     Heart sounds: Normal heart sounds.  Pulmonary:     Effort: Pulmonary effort is normal.     Breath sounds: Normal breath sounds.  Abdominal:     Palpations: Abdomen is soft. There is no hepatomegaly, splenomegaly or mass.     Tenderness: There is no abdominal tenderness.  Musculoskeletal:     Right lower leg: No edema.     Left lower leg: No edema.  Lymphadenopathy:     Cervical: No cervical adenopathy.     Right cervical: No superficial, deep or posterior cervical adenopathy.    Left cervical: No superficial, deep or posterior cervical adenopathy.     Upper Body:     Right upper body: No supraclavicular or axillary adenopathy.     Left upper body: No supraclavicular or axillary adenopathy.  Neurological:     General: No focal deficit present.     Mental Status: He  is alert and oriented to person, place, and time.  Psychiatric:        Mood and Affect: Mood normal.        Behavior: Behavior normal.     LABS:      Latest Ref Rng & Units 11/22/2022   11:24 AM 11/08/2022    1:17 PM 10/25/2022    2:03 PM  CBC  WBC 4.0 - 10.5 K/uL 8.0  6.8  7.9   Hemoglobin 13.0 - 17.0 g/dL 08.6  57.8  46.9   Hematocrit 39.0 - 52.0 % 44.1  40.4  42.1   Platelets 150 - 400 K/uL 277  227  246       Latest Ref Rng & Units 11/22/2022   11:24 AM 11/08/2022    1:17 PM 10/25/2022    2:03 PM  CMP  Glucose 70 - 99 mg/dL 95  96  865   BUN 8 - 23 mg/dL 7  8  7    Creatinine 0.61 - 1.24 mg/dL 7.84  6.96  2.95   Sodium 135 - 145 mmol/L 131  130  131   Potassium 3.5 - 5.1 mmol/L 4.2  3.8  3.7   Chloride 98 - 111 mmol/L 98  98  99   CO2 22 - 32 mmol/L 22  22  22    Calcium 8.9 - 10.3 mg/dL 8.8  8.9  8.5   Total Protein 6.5 - 8.1 g/dL 6.9  6.9  6.9   Total Bilirubin <1.2 mg/dL 0.8  0.6  0.6   Alkaline Phos 38 - 126 U/L 65  63  69   AST 15 - 41 U/L 39  56  28   ALT 0 - 44 U/L 53  83  31      No results found for: "CEA1", "CEA" / No results found for: "CEA1", "CEA" Lab Results  Component Value Date   PSA1 4.7 (H) 04/12/2022   No results found for: "MWU132" No results found for: "CAN125"  No results found for: "TOTALPROTELP", "ALBUMINELP", "A1GS", "A2GS", "BETS", "BETA2SER", "GAMS", "MSPIKE", "SPEI" No results found for: "TIBC", "FERRITIN", "IRONPCTSAT" No results found for: "LDH"   STUDIES:   No results found.

## 2022-11-22 NOTE — Progress Notes (Signed)
Patient presents today for chemotherapy infusion. Patient is in satisfactory condition with no new complaints voiced.  Vital signs are stable.  Labs reviewed by Dr. Katragadda during the office visit and all labs are within treatment parameters.  We will proceed with treatment per MD orders.   Patient tolerated treatment well with no complaints voiced.  Patient left ambulatory in stable condition.  Vital signs stable at discharge.  Follow up as scheduled.       

## 2022-11-22 NOTE — Patient Instructions (Signed)
[facility]  Discharge Instructions: Thank you for choosing [facility] to provide your oncology and hematology care.  If you have a lab appointment with the Cancer Center - please note that after April 8th, 2024, all labs will be drawn in the cancer center.  You do not have to check in or register with the main entrance as you have in the past but will complete your check-in in the cancer center.  Wear comfortable clothing and clothing appropriate for easy access to any Portacath or PICC line.   We strive to give you quality time with your provider. You may need to reschedule your appointment if you arrive late (15 or more minutes).  Arriving late affects you and other patients whose appointments are after yours.  Also, if you miss three or more appointments without notifying the office, you may be dismissed from the clinic at the provider's discretion.      For prescription refill requests, have your pharmacy contact our office and allow 72 hours for refills to be completed.    Today you received the following chemotherapy and/or immunotherapy agents Imfinzi.  Durvalumab Injection What is this medication? DURVALUMAB (dur VAL ue mab) treats some types of cancer. It works by helping your immune system slow or stop the spread of cancer cells. It is a monoclonal antibody. This medicine may be used for other purposes; ask your health care provider or pharmacist if you have questions. COMMON BRAND NAME(S): IMFINZI What should I tell my care team before I take this medication? They need to know if you have any of these conditions: Allogeneic stem cell transplant (uses someone else's stem cells) Autoimmune diseases, such as Crohn disease, ulcerative colitis, lupus History of chest radiation Nervous system problems, such as Guillain-Barre syndrome, myasthenia gravis Organ transplant An unusual or allergic reaction to durvalumab,  other medications, foods, dyes, or preservatives Pregnant or trying to get pregnant Breast-feeding How should I use this medication? This medication is infused into a vein. It is given by your care team in a hospital or clinic setting. A special MedGuide will be given to you before each treatment. Be sure to read this information carefully each time. Talk to your care team about the use of this medication in children. Special care may be needed. Overdosage: If you think you have taken too much of this medicine contact a poison control center or emergency room at once. NOTE: This medicine is only for you. Do not share this medicine with others. What if I miss a dose? Keep appointments for follow-up doses. It is important not to miss your dose. Call your care team if you are unable to keep an appointment. What may interact with this medication? Interactions have not been studied. This list may not describe all possible interactions. Give your health care provider a list of all the medicines, herbs, non-prescription drugs, or dietary supplements you use. Also tell them if you smoke, drink alcohol, or use illegal drugs. Some items may interact with your medicine. What should I watch for while using this medication? Your condition will be monitored carefully while you are receiving this medication. You may need blood work while taking this medication. This medication may cause serious skin reactions. They can happen weeks to months after starting the medication. Contact your care team right away if you notice fevers or flu-like symptoms with a rash. The rash may be red or purple  and then turn into blisters or peeling of the skin. You may also notice a red rash with swelling of the face, lips, or lymph nodes in your neck or under your arms. Tell your care team right away if you have any change in your eyesight. Talk to your care team if you may be pregnant. Serious birth defects can occur if you take  this medication during pregnancy and for 3 months after the last dose. You will need a negative pregnancy test before starting this medication. Contraception is recommended while taking this medication and for 3 months after the last dose. Your care team can help you find the option that works for you. Do not breastfeed while taking this medication and for 3 months after the last dose. What side effects may I notice from receiving this medication? Side effects that you should report to your care team as soon as possible: Allergic reactions--skin rash, itching, hives, swelling of the face, lips, tongue, or throat Dry cough, shortness of breath or trouble breathing Eye pain, redness, irritation, or discharge with blurry or decreased vision Heart muscle inflammation--unusual weakness or fatigue, shortness of breath, chest pain, fast or irregular heartbeat, dizziness, swelling of the ankles, feet, or hands Hormone gland problems--headache, sensitivity to light, unusual weakness or fatigue, dizziness, fast or irregular heartbeat, increased sensitivity to cold or heat, excessive sweating, constipation, hair loss, increased thirst or amount of urine, tremors or shaking, irritability Infusion reactions--chest pain, shortness of breath or trouble breathing, feeling faint or lightheaded Kidney injury (glomerulonephritis)--decrease in the amount of urine, red or dark Hopelynn Gartland urine, foamy or bubbly urine, swelling of the ankles, hands, or feet Liver injury--right upper belly pain, loss of appetite, nausea, light-colored stool, dark yellow or Shyvonne Chastang urine, yellowing skin or eyes, unusual weakness or fatigue Pain, tingling, or numbness in the hands or feet, muscle weakness, change in vision, confusion or trouble speaking, loss of balance or coordination, trouble walking, seizures Rash, fever, and swollen lymph nodes Redness, blistering, peeling, or loosening of the skin, including inside the mouth Sudden or severe  stomach pain, bloody diarrhea, fever, nausea, vomiting Side effects that usually do not require medical attention (report these to your care team if they continue or are bothersome): Bone, joint, or muscle pain Diarrhea Fatigue Loss of appetite Nausea Skin rash This list may not describe all possible side effects. Call your doctor for medical advice about side effects. You may report side effects to FDA at 1-800-FDA-1088. Where should I keep my medication? This medication is given in a hospital or clinic. It will not be stored at home. NOTE: This sheet is a summary. It may not cover all possible information. If you have questions about this medicine, talk to your doctor, pharmacist, or health care provider.  2024 Elsevier/Gold Standard (2021-05-09 00:00:00)        To help prevent nausea and vomiting after your treatment, we encourage you to take your nausea medication as directed.  BELOW ARE SYMPTOMS THAT SHOULD BE REPORTED IMMEDIATELY: *FEVER GREATER THAN 100.4 F (38 C) OR HIGHER *CHILLS OR SWEATING *NAUSEA AND VOMITING THAT IS NOT CONTROLLED WITH YOUR NAUSEA MEDICATION *UNUSUAL SHORTNESS OF BREATH *UNUSUAL BRUISING OR BLEEDING *URINARY PROBLEMS (pain or burning when urinating, or frequent urination) *BOWEL PROBLEMS (unusual diarrhea, constipation, pain near the anus) TENDERNESS IN MOUTH AND THROAT WITH OR WITHOUT PRESENCE OF ULCERS (sore throat, sores in mouth, or a toothache) UNUSUAL RASH, SWELLING OR PAIN  UNUSUAL VAGINAL DISCHARGE OR ITCHING   Items with *  indicate a potential emergency and should be followed up as soon as possible or go to the Emergency Department if any problems should occur.  Please show the CHEMOTHERAPY ALERT CARD or IMMUNOTHERAPY ALERT CARD at check-in to the Emergency Department and triage nurse.  Should you have questions after your visit or need to cancel or reschedule your appointment, please contact [facility] - A DEPT OF Eligha Bridegroom  [facility] [phone]  and follow the prompts.  Office hours are 8:00 a.m. to 4:30 p.m. Monday - Friday. Please note that voicemails left after 4:00 p.m. may not be returned until the following business day.  We are closed weekends and major holidays. You have access to a nurse at all times for urgent questions. Please call the main number to the clinic [phone] and follow the prompts.  For any non-urgent questions, you may also contact your provider using MyChart. We now offer e-Visits for anyone 74 and older to request care online for non-urgent symptoms. For details visit [email or web address].   Also download the MyChart app! Go to the app store, search "MyChart", open the app, select [place], and log in with your MyChart username and password.

## 2022-11-22 NOTE — Progress Notes (Signed)
Patient has been examined by Dr. Katragadda. Vital signs and labs have been reviewed by MD - ANC, Creatinine, LFTs, hemoglobin, and platelets are within treatment parameters per M.D. - pt may proceed with treatment.  Primary RN and pharmacy notified.  

## 2022-11-23 ENCOUNTER — Other Ambulatory Visit: Payer: Self-pay

## 2022-11-23 LAB — T4: T4, Total: 6.9 ug/dL (ref 4.5–12.0)

## 2022-12-05 ENCOUNTER — Ambulatory Visit: Payer: Medicare Other

## 2022-12-05 ENCOUNTER — Other Ambulatory Visit: Payer: Medicare Other

## 2022-12-18 ENCOUNTER — Other Ambulatory Visit: Payer: Self-pay | Admitting: Hematology

## 2022-12-18 DIAGNOSIS — R197 Diarrhea, unspecified: Secondary | ICD-10-CM

## 2022-12-18 MED ORDER — LOPERAMIDE HCL 2 MG PO CAPS: 2.0000 mg | ORAL_CAPSULE | ORAL | 1 refills | Status: DC | PRN

## 2022-12-20 ENCOUNTER — Inpatient Hospital Stay: Payer: Medicare Other

## 2022-12-20 ENCOUNTER — Inpatient Hospital Stay: Payer: Medicare Other | Attending: Hematology

## 2022-12-20 VITALS — BP 119/73 | HR 87 | Temp 97.5°F | Resp 17

## 2022-12-20 DIAGNOSIS — Z95828 Presence of other vascular implants and grafts: Secondary | ICD-10-CM

## 2022-12-20 DIAGNOSIS — C3411 Malignant neoplasm of upper lobe, right bronchus or lung: Secondary | ICD-10-CM | POA: Insufficient documentation

## 2022-12-20 DIAGNOSIS — Z5112 Encounter for antineoplastic immunotherapy: Secondary | ICD-10-CM | POA: Insufficient documentation

## 2022-12-20 DIAGNOSIS — R918 Other nonspecific abnormal finding of lung field: Secondary | ICD-10-CM

## 2022-12-20 DIAGNOSIS — C3491 Malignant neoplasm of unspecified part of right bronchus or lung: Secondary | ICD-10-CM

## 2022-12-20 DIAGNOSIS — Z79899 Other long term (current) drug therapy: Secondary | ICD-10-CM | POA: Insufficient documentation

## 2022-12-20 LAB — COMPREHENSIVE METABOLIC PANEL
ALT: 42 U/L (ref 0–44)
AST: 31 U/L (ref 15–41)
Albumin: 3.7 g/dL (ref 3.5–5.0)
Alkaline Phosphatase: 57 U/L (ref 38–126)
Anion gap: 10 (ref 5–15)
BUN: 11 mg/dL (ref 8–23)
CO2: 21 mmol/L — ABNORMAL LOW (ref 22–32)
Calcium: 8.9 mg/dL (ref 8.9–10.3)
Chloride: 102 mmol/L (ref 98–111)
Creatinine, Ser: 1.36 mg/dL — ABNORMAL HIGH (ref 0.61–1.24)
GFR, Estimated: 57 mL/min — ABNORMAL LOW (ref >60)
Glucose, Bld: 114 mg/dL — ABNORMAL HIGH (ref 70–99)
Potassium: 3.9 mmol/L (ref 3.5–5.1)
Sodium: 133 mmol/L — ABNORMAL LOW (ref 135–145)
Total Bilirubin: 0.7 mg/dL (ref <1.2)
Total Protein: 6.9 g/dL (ref 6.5–8.1)

## 2022-12-20 LAB — CBC WITH DIFFERENTIAL/PLATELET
Abs Immature Granulocytes: 0.04 10*3/uL (ref 0.00–0.07)
Basophils Absolute: 0.1 10*3/uL (ref 0.0–0.1)
Basophils Relative: 1 %
Eosinophils Absolute: 0.2 10*3/uL (ref 0.0–0.5)
Eosinophils Relative: 3 %
HCT: 42.6 % (ref 39.0–52.0)
Hemoglobin: 14.8 g/dL (ref 13.0–17.0)
Immature Granulocytes: 1 %
Lymphocytes Relative: 5 %
Lymphs Abs: 0.4 10*3/uL — ABNORMAL LOW (ref 0.7–4.0)
MCH: 33 pg (ref 26.0–34.0)
MCHC: 34.7 g/dL (ref 30.0–36.0)
MCV: 94.9 fL (ref 80.0–100.0)
Monocytes Absolute: 0.7 10*3/uL (ref 0.1–1.0)
Monocytes Relative: 10 %
Neutro Abs: 5.7 10*3/uL (ref 1.7–7.7)
Neutrophils Relative %: 80 %
Platelets: 234 10*3/uL (ref 150–400)
RBC: 4.49 MIL/uL (ref 4.22–5.81)
RDW: 13.2 % (ref 11.5–15.5)
WBC: 7.1 10*3/uL (ref 4.0–10.5)
nRBC: 0 % (ref 0.0–0.2)

## 2022-12-20 LAB — TSH: TSH: 1.183 u[IU]/mL (ref 0.350–4.500)

## 2022-12-20 LAB — MAGNESIUM: Magnesium: 1.8 mg/dL (ref 1.7–2.4)

## 2022-12-20 MED ORDER — HEPARIN SOD (PORK) LOCK FLUSH 100 UNIT/ML IV SOLN
500.0000 [IU] | Freq: Once | INTRAVENOUS | Status: AC | PRN
Start: 2022-12-20 — End: 2022-12-20
  Administered 2022-12-20: 500 [IU]

## 2022-12-20 MED ORDER — SODIUM CHLORIDE 0.9 % IV SOLN: Freq: Once | INTRAVENOUS | Status: AC

## 2022-12-20 MED ORDER — SODIUM CHLORIDE 0.9% FLUSH
10.0000 mL | INTRAVENOUS | Status: DC | PRN
  Administered 2022-12-20: 10 mL via INTRAVENOUS

## 2022-12-20 MED ORDER — SODIUM CHLORIDE 0.9 % IV SOLN
1500.0000 mg | Freq: Once | INTRAVENOUS | Status: AC
  Administered 2022-12-20: 1500 mg via INTRAVENOUS
  Filled 2022-12-20: qty 30

## 2022-12-20 MED ORDER — SODIUM CHLORIDE 0.9% FLUSH
10.0000 mL | INTRAVENOUS | Status: DC | PRN
  Administered 2022-12-20: 10 mL

## 2022-12-20 NOTE — Progress Notes (Signed)
Patients port flushed without difficulty.  Good blood return noted with no bruising or swelling noted at site.  Patient remains accessed for treatment.  

## 2022-12-20 NOTE — Progress Notes (Signed)
Patient presents today for chemotherapy infusion.  Patient is in satisfactory condition with no new complaints voiced.  Vital signs are stable.  Labs reviewed and all labs are within treatment parameters.  We will proceed with treatment per MD orders.    Patient tolerated treatment well with no complaints voiced.  Patient left ambulatory with sister in stable condition.  Vital signs stable at discharge.  Follow up as scheduled.

## 2022-12-20 NOTE — Patient Instructions (Signed)
 [facility] [place] [facility]  Discharge Instructions: Thank you for choosing [facility] to provide your oncology and hematology care.  If you have a lab appointment with the Cancer Center - please note that after April 8th, 2024, all labs will be drawn in the cancer center.  You do not have to check in or register with the main entrance as you have in the past but will complete your check-in in the cancer center.  Wear comfortable clothing and clothing appropriate for easy access to any Portacath or PICC line.   We strive to give you quality time with your provider. You may need to reschedule your appointment if you arrive late (15 or more minutes).  Arriving late affects you and other patients whose appointments are after yours.  Also, if you miss three or more appointments without notifying the office, you may be dismissed from the clinic at the provider's discretion.      For prescription refill requests, have your pharmacy contact our office and allow 72 hours for refills to be completed.    Today you received the following chemotherapy and/or immunotherapy agents Imfinzi.  Durvalumab Injection What is this medication? DURVALUMAB (dur VAL ue mab) treats some types of cancer. It works by helping your immune system slow or stop the spread of cancer cells. It is a monoclonal antibody. This medicine may be used for other purposes; ask your health care provider or pharmacist if you have questions. COMMON BRAND NAME(S): IMFINZI What should I tell my care team before I take this medication? They need to know if you have any of these conditions: Allogeneic stem cell transplant (uses someone else's stem cells) Autoimmune diseases, such as Crohn disease, ulcerative colitis, lupus History of chest radiation Nervous system problems, such as Guillain-Barre syndrome, myasthenia gravis Organ transplant An unusual or allergic reaction to durvalumab,  other medications, foods, dyes, or preservatives Pregnant or trying to get pregnant Breast-feeding How should I use this medication? This medication is infused into a vein. It is given by your care team in a hospital or clinic setting. A special MedGuide will be given to you before each treatment. Be sure to read this information carefully each time. Talk to your care team about the use of this medication in children. Special care may be needed. Overdosage: If you think you have taken too much of this medicine contact a poison control center or emergency room at once. NOTE: This medicine is only for you. Do not share this medicine with others. What if I miss a dose? Keep appointments for follow-up doses. It is important not to miss your dose. Call your care team if you are unable to keep an appointment. What may interact with this medication? Interactions have not been studied. This list may not describe all possible interactions. Give your health care provider a list of all the medicines, herbs, non-prescription drugs, or dietary supplements you use. Also tell them if you smoke, drink alcohol, or use illegal drugs. Some items may interact with your medicine. What should I watch for while using this medication? Your condition will be monitored carefully while you are receiving this medication. You may need blood work while taking this medication. This medication may cause serious skin reactions. They can happen weeks to months after starting the medication. Contact your care team right away if you notice fevers or flu-like symptoms with a rash. The rash may be red or  purple and then turn into blisters or peeling of the skin. You may also notice a red rash with swelling of the face, lips, or lymph nodes in your neck or under your arms. Tell your care team right away if you have any change in your eyesight. Talk to your care team if you may be pregnant. Serious birth defects can occur if you take  this medication during pregnancy and for 3 months after the last dose. You will need a negative pregnancy test before starting this medication. Contraception is recommended while taking this medication and for 3 months after the last dose. Your care team can help you find the option that works for you. Do not breastfeed while taking this medication and for 3 months after the last dose. What side effects may I notice from receiving this medication? Side effects that you should report to your care team as soon as possible: Allergic reactions--skin rash, itching, hives, swelling of the face, lips, tongue, or throat Dry cough, shortness of breath or trouble breathing Eye pain, redness, irritation, or discharge with blurry or decreased vision Heart muscle inflammation--unusual weakness or fatigue, shortness of breath, chest pain, fast or irregular heartbeat, dizziness, swelling of the ankles, feet, or hands Hormone gland problems--headache, sensitivity to light, unusual weakness or fatigue, dizziness, fast or irregular heartbeat, increased sensitivity to cold or heat, excessive sweating, constipation, hair loss, increased thirst or amount of urine, tremors or shaking, irritability Infusion reactions--chest pain, shortness of breath or trouble breathing, feeling faint or lightheaded Kidney injury (glomerulonephritis)--decrease in the amount of urine, red or dark Gavyn Zoss urine, foamy or bubbly urine, swelling of the ankles, hands, or feet Liver injury--right upper belly pain, loss of appetite, nausea, light-colored stool, dark yellow or Sheamus Hasting urine, yellowing skin or eyes, unusual weakness or fatigue Pain, tingling, or numbness in the hands or feet, muscle weakness, change in vision, confusion or trouble speaking, loss of balance or coordination, trouble walking, seizures Rash, fever, and swollen lymph nodes Redness, blistering, peeling, or loosening of the skin, including inside the mouth Sudden or severe  stomach pain, bloody diarrhea, fever, nausea, vomiting Side effects that usually do not require medical attention (report these to your care team if they continue or are bothersome): Bone, joint, or muscle pain Diarrhea Fatigue Loss of appetite Nausea Skin rash This list may not describe all possible side effects. Call your doctor for medical advice about side effects. You may report side effects to FDA at 1-800-FDA-1088. Where should I keep my medication? This medication is given in a hospital or clinic. It will not be stored at home. NOTE: This sheet is a summary. It may not cover all possible information. If you have questions about this medicine, talk to your doctor, pharmacist, or health care provider.  2024 Elsevier/Gold Standard (2021-05-09 00:00:00)        To help prevent nausea and vomiting after your treatment, we encourage you to take your nausea medication as directed.  BELOW ARE SYMPTOMS THAT SHOULD BE REPORTED IMMEDIATELY: *FEVER GREATER THAN 100.4 F (38 C) OR HIGHER *CHILLS OR SWEATING *NAUSEA AND VOMITING THAT IS NOT CONTROLLED WITH YOUR NAUSEA MEDICATION *UNUSUAL SHORTNESS OF BREATH *UNUSUAL BRUISING OR BLEEDING *URINARY PROBLEMS (pain or burning when urinating, or frequent urination) *BOWEL PROBLEMS (unusual diarrhea, constipation, pain near the anus) TENDERNESS IN MOUTH AND THROAT WITH OR WITHOUT PRESENCE OF ULCERS (sore throat, sores in mouth, or a toothache) UNUSUAL RASH, SWELLING OR PAIN  UNUSUAL VAGINAL DISCHARGE OR ITCHING   Items  with * indicate a potential emergency and should be followed up as soon as possible or go to the Emergency Department if any problems should occur.  Please show the CHEMOTHERAPY ALERT CARD or IMMUNOTHERAPY ALERT CARD at check-in to the Emergency Department and triage nurse.  Should you have questions after your visit or need to cancel or reschedule your appointment, please contact [facility] [place] - A DEPT OF Eligha Bridegroom [facility] [phone]  and follow the prompts.  Office hours are 8:00 a.m. to 4:30 p.m. Monday - Friday. Please note that voicemails left after 4:00 p.m. may not be returned until the following business day.  We are closed weekends and major holidays. You have access to a nurse at all times for urgent questions. Please call the main number to the clinic [phone] and follow the prompts.  For any non-urgent questions, you may also contact your provider using MyChart. We now offer e-Visits for anyone 54 and older to request care online for non-urgent symptoms. For details visit [email or web address].   Also download the MyChart app! Go to the app store, search "MyChart", open the app, select [place], and log in with your MyChart username and password.

## 2022-12-21 LAB — T4: T4, Total: 7.6 ug/dL (ref 4.5–12.0)

## 2023-01-04 ENCOUNTER — Other Ambulatory Visit: Payer: Self-pay

## 2023-01-10 ENCOUNTER — Ambulatory Visit (HOSPITAL_COMMUNITY)
Admission: RE | Admit: 2023-01-10 | Discharge: 2023-01-10 | Disposition: A | Discharge status: Home / Self Care | Payer: Medicare Other | Source: Ambulatory Visit | Attending: Hematology | Admitting: Hematology

## 2023-01-10 DIAGNOSIS — C3491 Malignant neoplasm of unspecified part of right bronchus or lung: Secondary | ICD-10-CM | POA: Diagnosis not present

## 2023-01-10 DIAGNOSIS — C3411 Malignant neoplasm of upper lobe, right bronchus or lung: Secondary | ICD-10-CM | POA: Diagnosis not present

## 2023-01-10 DIAGNOSIS — I700 Atherosclerosis of aorta: Secondary | ICD-10-CM | POA: Diagnosis not present

## 2023-01-10 MED ORDER — IOHEXOL 300 MG/ML  SOLN
75.0000 mL | Freq: Once | INTRAMUSCULAR | Status: AC | PRN
Start: 2023-01-10 — End: 2023-01-10
  Administered 2023-01-10: 75 mL via INTRAVENOUS

## 2023-01-13 ENCOUNTER — Other Ambulatory Visit: Payer: Self-pay

## 2023-01-17 ENCOUNTER — Inpatient Hospital Stay: Payer: Medicare Other | Attending: Hematology | Admitting: Hematology

## 2023-01-17 ENCOUNTER — Inpatient Hospital Stay: Payer: Medicare Other

## 2023-01-17 VITALS — BP 137/72 | HR 89 | Temp 97.8°F | Resp 20

## 2023-01-17 VITALS — Wt 186.1 lb

## 2023-01-17 DIAGNOSIS — Z5112 Encounter for antineoplastic immunotherapy: Secondary | ICD-10-CM | POA: Insufficient documentation

## 2023-01-17 DIAGNOSIS — C3491 Malignant neoplasm of unspecified part of right bronchus or lung: Secondary | ICD-10-CM | POA: Diagnosis not present

## 2023-01-17 DIAGNOSIS — F32A Depression, unspecified: Secondary | ICD-10-CM | POA: Diagnosis not present

## 2023-01-17 DIAGNOSIS — C3411 Malignant neoplasm of upper lobe, right bronchus or lung: Secondary | ICD-10-CM | POA: Insufficient documentation

## 2023-01-17 DIAGNOSIS — Z95828 Presence of other vascular implants and grafts: Secondary | ICD-10-CM | POA: Diagnosis not present

## 2023-01-17 DIAGNOSIS — Z79899 Other long term (current) drug therapy: Secondary | ICD-10-CM | POA: Insufficient documentation

## 2023-01-17 DIAGNOSIS — F17210 Nicotine dependence, cigarettes, uncomplicated: Secondary | ICD-10-CM | POA: Insufficient documentation

## 2023-01-17 DIAGNOSIS — J449 Chronic obstructive pulmonary disease, unspecified: Secondary | ICD-10-CM | POA: Diagnosis not present

## 2023-01-17 LAB — CBC WITH DIFFERENTIAL/PLATELET
Abs Immature Granulocytes: 0.04 10*3/uL (ref 0.00–0.07)
Basophils Absolute: 0.1 10*3/uL (ref 0.0–0.1)
Basophils Relative: 1 %
Eosinophils Absolute: 0.2 10*3/uL (ref 0.0–0.5)
Eosinophils Relative: 4 %
HCT: 42.7 % (ref 39.0–52.0)
Hemoglobin: 14.6 g/dL (ref 13.0–17.0)
Immature Granulocytes: 1 %
Lymphocytes Relative: 7 %
Lymphs Abs: 0.4 10*3/uL — ABNORMAL LOW (ref 0.7–4.0)
MCH: 31.9 pg (ref 26.0–34.0)
MCHC: 34.2 g/dL (ref 30.0–36.0)
MCV: 93.2 fL (ref 80.0–100.0)
Monocytes Absolute: 0.6 10*3/uL (ref 0.1–1.0)
Monocytes Relative: 9 %
Neutro Abs: 4.7 10*3/uL (ref 1.7–7.7)
Neutrophils Relative %: 78 %
Platelets: 274 10*3/uL (ref 150–400)
RBC: 4.58 MIL/uL (ref 4.22–5.81)
RDW: 13.8 % (ref 11.5–15.5)
WBC: 6 10*3/uL (ref 4.0–10.5)
nRBC: 0 % (ref 0.0–0.2)

## 2023-01-17 LAB — COMPREHENSIVE METABOLIC PANEL
ALT: 27 U/L (ref 0–44)
AST: 28 U/L (ref 15–41)
Albumin: 3.8 g/dL (ref 3.5–5.0)
Alkaline Phosphatase: 59 U/L (ref 38–126)
Anion gap: 11 (ref 5–15)
BUN: 5 mg/dL — ABNORMAL LOW (ref 8–23)
CO2: 20 mmol/L — ABNORMAL LOW (ref 22–32)
Calcium: 8.5 mg/dL — ABNORMAL LOW (ref 8.9–10.3)
Chloride: 96 mmol/L — ABNORMAL LOW (ref 98–111)
Creatinine, Ser: 0.74 mg/dL (ref 0.61–1.24)
GFR, Estimated: >60 mL/min (ref >60)
Glucose, Bld: 145 mg/dL — ABNORMAL HIGH (ref 70–99)
Potassium: 4.3 mmol/L (ref 3.5–5.1)
Sodium: 127 mmol/L — ABNORMAL LOW (ref 135–145)
Total Bilirubin: 0.5 mg/dL (ref 0.0–1.2)
Total Protein: 6.9 g/dL (ref 6.5–8.1)

## 2023-01-17 LAB — TSH: TSH: 1.291 u[IU]/mL (ref 0.350–4.500)

## 2023-01-17 MED ORDER — SODIUM CHLORIDE 0.9 % IV SOLN
1500.0000 mg | Freq: Once | INTRAVENOUS | Status: AC
  Administered 2023-01-17: 1500 mg via INTRAVENOUS
  Filled 2023-01-17: qty 30

## 2023-01-17 MED ORDER — SODIUM CHLORIDE 0.9 % IV SOLN: Freq: Once | INTRAVENOUS | Status: AC

## 2023-01-17 MED ORDER — HEPARIN SOD (PORK) LOCK FLUSH 100 UNIT/ML IV SOLN
500.0000 [IU] | Freq: Once | INTRAVENOUS | Status: AC | PRN
Start: 2023-01-17 — End: 2023-01-17
  Administered 2023-01-17: 500 [IU]

## 2023-01-17 MED ORDER — SODIUM CHLORIDE 0.9% FLUSH
10.0000 mL | INTRAVENOUS | Status: DC | PRN
  Administered 2023-01-17: 10 mL

## 2023-01-17 NOTE — Patient Instructions (Signed)
 [facility] [place] [facility]  Discharge Instructions: Thank you for choosing [facility] to provide your oncology and hematology care.  If you have a lab appointment with the Cancer Center - please note that after April 8th, 2024, all labs will be drawn in the cancer center.  You do not have to check in or register with the main entrance as you have in the past but will complete your check-in in the cancer center.  Wear comfortable clothing and clothing appropriate for easy access to any Portacath or PICC line.   We strive to give you quality time with your provider. You may need to reschedule your appointment if you arrive late (15 or more minutes).  Arriving late affects you and other patients whose appointments are after yours.  Also, if you miss three or more appointments without notifying the office, you may be dismissed from the clinic at the provider's discretion.      For prescription refill requests, have your pharmacy contact our office and allow 72 hours for refills to be completed.    Today you received the following chemotherapy and/or immunotherapy agents Imfinzi, return as scheduled.   To help prevent nausea and vomiting after your treatment, we encourage you to take your nausea medication as directed.  BELOW ARE SYMPTOMS THAT SHOULD BE REPORTED IMMEDIATELY: *FEVER GREATER THAN 100.4 F (38 C) OR HIGHER *CHILLS OR SWEATING *NAUSEA AND VOMITING THAT IS NOT CONTROLLED WITH YOUR NAUSEA MEDICATION *UNUSUAL SHORTNESS OF BREATH *UNUSUAL BRUISING OR BLEEDING *URINARY PROBLEMS (pain or burning when urinating, or frequent urination) *BOWEL PROBLEMS (unusual diarrhea, constipation, pain near the anus) TENDERNESS IN MOUTH AND THROAT WITH OR WITHOUT PRESENCE OF ULCERS (sore throat, sores in mouth, or a toothache) UNUSUAL RASH, SWELLING OR PAIN  UNUSUAL VAGINAL DISCHARGE OR ITCHING   Items with * indicate a potential emergency and  should be followed up as soon as possible or go to the Emergency Department if any problems should occur.  Please show the CHEMOTHERAPY ALERT CARD or IMMUNOTHERAPY ALERT CARD at check-in to the Emergency Department and triage nurse.  Should you have questions after your visit or need to cancel or reschedule your appointment, please contact [facility] [place] - A DEPT OF Eligha Bridegroom [facility] [phone]  and follow the prompts.  Office hours are 8:00 a.m. to 4:30 p.m. Monday - Friday. Please note that voicemails left after 4:00 p.m. may not be returned until the following business day.  We are closed weekends and major holidays. You have access to a nurse at all times for urgent questions. Please call the main number to the clinic [phone] and follow the prompts.  For any non-urgent questions, you may also contact your provider using MyChart. We now offer e-Visits for anyone 51 and older to request care online for non-urgent symptoms. For details visit [email or web address].   Also download the MyChart app! Go to the app store, search "MyChart", open the app, select [place], and log in with your MyChart username and password.

## 2023-01-17 NOTE — Progress Notes (Signed)
 [facility] [address] [place] [address]    Clinic Day:  01/17/23   Referring physician: Shona Norleen PEDLAR, MD  Patient Care Team: Shona Norleen PEDLAR, MD as PCP - General (Internal Medicine) Rogers Hai, MD as Medical Oncologist (Medical Oncology) Celestia Joesph SQUIBB, RN as Oncology Nurse Navigator (Medical Oncology)   ASSESSMENT & PLAN:   Assessment: 1.  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  No hemoptysis/weight loss. - Chest x-ray (04/10/2022): Mid right lung mass. - CT chest (04/13/2022): 9.2 x 5.7 x 6.6 cm right upper lobe mass with lobulations appearing to extend across the minor fissure into the right middle lobe and right lower lobe.  Satellite lesion in the right upper lobe measures 1.6 x 1.4 x 1.4 cm.  No findings of chest wall invasion.  Borderline prominent lower right paratracheal lymph node 0.9 cm.  Old granulomatous disease.  Small left adrenal adenoma. - Has right posterior chest wall pain for the past 6 months, dull type.  He is not requiring any pain medication. - Brain MRI (04/19/2022): No metastatic disease. - PET scan (04/19/2022): 9 x 5.7 cm right upper lobe mass with SUV 11.8.  1.3 x 0.9 cm satellite nodule in the right upper lobe with SUV 3.8.  No adenopathy or distant metastatic disease. - Bronchoscopy and biopsy by Dr. Shelah - Pathology (04/23/2022): Both right upper lobe lung nodule FNA consistent with poorly differentiated carcinoma.  IHC diffusely positive for TTF-1, Napsin, synaptophysin and CK7.  CD56 negative.  P63 and CK5/6 negative.  Given Napsin positivity, favor adenocarcinoma.  There is also diffuse synaptophysin positivity consistent with neuroendocrine differentiation.  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 - Discussed with Dr. Kerrin.  Not a surgical candidate as he requires pneumonectomy and has  suboptimal DLCO. - Chemoradiation with carboplatin  and paclitaxel  from 05/22/2022 through 07/06/2022 - Consolidation durvalumab  started on 07/27/2022   2.  Social/family history: - He is seen with his 2 sisters today.  Lives by himself and is independent of ADLs and IADLs.  He retired after working at [employer] for the last 34 years.  No asbestos exposure.  No other chemicals.  Smokes more than 1 pack/day for the last 50 years (started at age 26) quit smoking for 5 years before starting back again. - Brother died of lung cancer.  Father had bladder cancer.  Maternal uncle had cancer.    Plan: 1.  Stage IIIa adenocarcinoma of the right lung: - He is tolerating Imfinzi  monthly dose reasonably well. - Denies any immunotherapy related side effects. - Reviewed CT chest from 01/10/2023: Right upper lobe lung mass measures 2.7 x 5.1 cm, previously 3.6 x 5.3 cm.  Small nodule in the central right upper lobe measures 1.1 x 1.7 cm, previously 1.0 x 1.9 cm.  Multiple stable bilateral lung nodules.  No evidence of progression. - Reviewed labs today: Normal LFTs.  Creatinine normal.  CBC grossly normal.  Mild hyponatremia.  TSH is 1.2. - Recommend continuing durvalumab  every 4 weeks.  RTC 8 weeks for follow-up.   2.  Anxiety/depression: - Continue clonazepam  1 mg twice daily which is helping.   3.  COPD: - Continue Tessalon  Perles twice daily as needed.    Orders Placed This Encounter  Procedures   CBC with Differential    Standing Status:   Future  Expected Date:   03/14/2023    Expiration Date:   03/13/2024   Comprehensive metabolic panel    Standing Status:   Future    Expected Date:   03/14/2023    Expiration Date:   03/13/2024   T4    Standing Status:   Future    Expected Date:   03/14/2023    Expiration Date:   03/13/2024   TSH    Standing Status:   Future    Expected Date:   03/14/2023    Expiration Date:   03/13/2024   CBC with Differential    Standing Status:   Future    Expected Date:    04/11/2023    Expiration Date:   04/10/2024   Comprehensive metabolic panel    Standing Status:   Future    Expected Date:   04/11/2023    Expiration Date:   04/10/2024   T4    Standing Status:   Future    Expected Date:   04/11/2023    Expiration Date:   04/10/2024   TSH    Standing Status:   Future    Expected Date:   04/11/2023    Expiration Date:   04/10/2024      LILLETTE Hummingbird R Teague,acting as a scribe for [name], MD.,have documented all relevant documentation on the behalf of [name], MD,as directed by  [name], MD while in the presence of [name], MD.  I, [name] MD, have reviewed the above documentation for accuracy and completeness, and I agree with the above.      [name], MD   1/9/20255:20 PM  CHIEF COMPLAINT:   Diagnosis: RUL lung adenocarcinoma    Cancer Staging  Non-small cell cancer of right lung (HCC) Staging form: Lung, AJCC 8th Edition - Clinical stage from 05/01/2022: Stage IIIA (cT4, cN0, cM0) - Unsigned    Prior Therapy: none  Current Therapy:  concurrent chemoradiation with carboplatin  + paclitaxel     HISTORY OF PRESENT ILLNESS:   Oncology History  Non-small cell cancer of right lung (HCC)  05/01/2022 Initial Diagnosis   Non-small cell cancer of right lung (HCC)   05/22/2022 - 07/05/2022 Chemotherapy   Patient is on Treatment Plan : LUNG Carboplatin  + Paclitaxel  + XRT q7d     07/27/2022 -  Chemotherapy   Patient is on Treatment Plan : LUNG Durvalumab  q 28 day (beginning 11/22/22)        INTERVAL HISTORY:   Carlos [name] is a 68 y.o. male presenting to clinic today for follow up of RUL lung adenocarcinoma. He was last seen by me on 11/22/22.  Since his las visit, he underwent CT chest on 01/10/23.  Today, he states that he is doing well overall. His appetite level is at 100%. His energy level is at 100%. He is accompanied by family members. He notes normal appetite and energy levels. His  cough has mildly improved since his last visit and he is no longer having to rest when walking long distances. He denies any infections in the last 2 months. He is taking his medications as prescribed and denies any medication changes.   PAST MEDICAL HISTORY:   Past Medical History: Past Medical History:  Diagnosis Date   Anxiety    Cancer (HCC)    Skin cancer left shoulder   Chronic low back pain    Complication of anesthesia    Dermatofibrosarcoma protubera of right shoulder    Dyspnea    GERD (gastroesophageal reflux disease)  Hyperlipidemia    Lung cancer (HCC) 04/23/2022   PONV (postoperative nausea and vomiting)    Port-A-Cath in place 05/17/2022   Prostate cancer [facility])     Surgical History: Past Surgical History:  Procedure Laterality Date   APPENDECTOMY     APPLICATION OF A-CELL OF EXTREMITY Right 07/09/2012   Procedure: PLACEMENT OF A-CELL TO UPPER EXTREMITY/PLACEMENT OF VAC;  Surgeon: Estefana Reichert, DO;  Location: [facility];  Service: Plastics;  Laterality: Right;   BRONCHIAL BRUSHINGS  04/23/2022   Procedure: BRONCHIAL BRUSHINGS;  Surgeon: [name], MD;  Location: [facility] ENDOSCOPY;  Service: Pulmonary;;   BRONCHIAL NEEDLE ASPIRATION BIOPSY  04/23/2022   Procedure: BRONCHIAL NEEDLE ASPIRATION BIOPSIES;  Surgeon: [name], MD;  Location: [facility] ENDOSCOPY;  Service: Pulmonary;;   COLONOSCOPY WITH PROPOFOL  N/A 09/30/2015   Procedure: COLONOSCOPY WITH PROPOFOL ;  Surgeon: Claudis RAYMOND Rivet, MD;  Location: [facility] ENDO SUITE;  Service: Endoscopy;  Laterality: N/A;  830   EXCISION DERMATOFIBROCARCOMA PROTUBERAN RIGHT SHOULDER  2 - 3 WKS AGO   HEMOSTASIS CONTROL  04/23/2022   Procedure: HEMOSTASIS CONTROL;  Surgeon: [name], MD;  Location: [facility] ENDOSCOPY;  Service: Pulmonary;;   INGUINAL HERNIA REPAIR Left 1999   IR IMAGING GUIDED PORT INSERTION  04/30/2022   KNEE ARTHROSCOPY Left 2012   POLYPECTOMY  09/30/2015   Procedure: POLYPECTOMY;  Surgeon: Claudis RAYMOND Rivet, MD;  Location: [facility] ENDO SUITE;  Service: Endoscopy;;  sigmoid   VIDEO BRONCHOSCOPY WITH ENDOBRONCHIAL ULTRASOUND Bilateral 04/23/2022   Procedure: VIDEO BRONCHOSCOPY WITH ENDOBRONCHIAL ULTRASOUND;  Surgeon: [name], MD;  Location: [facility] ENDOSCOPY;  Service: Pulmonary;  Laterality: Bilateral;   VIDEO BRONCHOSCOPY WITH RADIAL ENDOBRONCHIAL ULTRASOUND  04/23/2022   Procedure: VIDEO BRONCHOSCOPY WITH RADIAL ENDOBRONCHIAL ULTRASOUND;  Surgeon: [name], MD;  Location: [facility] ENDOSCOPY;  Service: Pulmonary;;    Social History: Social History   Socioeconomic History   Marital status: Divorced    Spouse name: Apolinar   Number of children: 1   Years of education: 10th   Highest education level: Not on file  Occupational History    Employer: [employer]    Comment: [employer]  Tobacco Use   Smoking status: Every Day    Current packs/day: 1.00    Average packs/day: 1 pack/day for 40.0 years (40.0 ttl pk-yrs)    Types: Cigarettes   Smokeless tobacco: Never  Vaping Use   Vaping status: Never Used  Substance and Sexual Activity   Alcohol use: Yes    Comment: 2-3 cans beer/day   Drug use: No   Sexual activity: Not on file  Other Topics Concern   Not on file  Social History Narrative   Patient lives at home with his dog. 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 Patient has 10 grade education.Patient is right-handed.Caffeine Use: 1 cup of coffee; 3-4 sodas daily   Social Drivers of Health   Financial Resource Strain: High Risk (05/10/2022)   Overall Financial Resource Strain (CARDIA)    Difficulty of Paying Living Expenses: Very hard  Food Insecurity: Food Insecurity Present (05/04/2022)   Hunger Vital Sign    Worried About Running Out of Food in the Last Year: Sometimes true    Ran Out of Food in the Last Year: Sometimes true  Transportation Needs: No Transportation Needs (05/04/2022)   PRAPARE - Scientist, Research (physical Sciences) (Medical): No    Lack of Transportation (Non-Medical): No  Physical  Activity: Not on file  Stress: Not on file  Social Connections: Not on file  Intimate Partner Violence: Not At Risk (05/04/2022)   Humiliation, Afraid, Rape, and Kick questionnaire    Fear of Current or Ex-Partner: No    Emotionally Abused: No    Physically Abused: No    Sexually Abused: No    Family History: Family History  Problem Relation Age of Onset   Dementia Mother    Cancer Father     Current Medications:  Current Outpatient Medications:    acetaminophen  (TYLENOL ) 500 MG tablet, Take 1,000 mg by mouth every 6 (six) hours as needed (pain.)., Disp: , Rfl:    albuterol  (VENTOLIN  HFA) 108 (90 Base) MCG/ACT inhaler, Inhale 2 puffs into the lungs every 6 (six) hours as needed for wheezing or shortness of breath., Disp: 8 g, Rfl: 6   aspirin EC 81 MG tablet, Take 81 mg by mouth at bedtime. Swallow whole., Disp: , Rfl:    benzonatate  (TESSALON ) 200 MG capsule, Take 1 capsule by mouth three times daily as needed for cough, Disp: 120 capsule, Rfl: 0   clonazePAM  (KLONOPIN ) 1 MG disintegrating tablet, Take one tablet by mouth every morning and two tablets by mouth at bedtime, Disp: 90 tablet, Rfl: 2   Durvalumab  (IMFINZI  IV), Inject into the vein every 14 (fourteen) days., Disp: , Rfl:    lidocaine -prilocaine  (EMLA ) cream, Apply a quarter-sized amount to port a cath site and cover with plastic wrap 1 hour prior to infusion appointments, Disp: 30 g, Rfl: 3   loperamide  (IMODIUM ) 2 MG capsule, Take 1 capsule (2 mg total) by mouth as needed for diarrhea or loose stools (Take 2 capsules after the first loose stool and then 1 capsule after each loose stool bt do not exceed more than 8 capsules in a 24 hr period. If it is bedtime take 2 capsules at bedtime and then take 2 capsules every 4 hours until morning)., Disp: 30 capsule, Rfl: 1   meloxicam  (MOBIC ) 15 MG tablet, Take 15 mg by mouth in the morning., Disp: ,  Rfl:    pantoprazole (PROTONIX) 40 MG tablet, Take 40 mg by mouth daily., Disp: , Rfl:    prochlorperazine  (COMPAZINE ) 10 MG tablet, Take 10 mg by mouth every 6 (six) hours as needed., Disp: , Rfl:    simvastatin  (ZOCOR ) 20 MG tablet, Take 20 mg by mouth at bedtime., Disp: , Rfl: 2   sucralfate  (CARAFATE ) 1 g tablet, Take 1 tablet (1 g total) by mouth 4 (four) times daily. Dissolve each tablet in 15 cc water before use., Disp: 120 tablet, Rfl: 2   tamsulosin  (FLOMAX ) 0.4 MG CAPS capsule, Take 1 capsule (0.4 mg total) by mouth daily. (Patient taking differently: Take 0.4 mg by mouth at bedtime.), Disp: 30 capsule, Rfl: 11 No current facility-administered medications for this visit.  Facility-Administered Medications Ordered in Other Visits:    sodium chloride  flush (NS) 0.9 % injection 10 mL, 10 mL, Intracatheter, PRN, Hayzlee Mcsorley, MD, 10 mL at 01/17/23 1519   Allergies: Allergies  Allergen Reactions   Ciprofloxacin Swelling and Other (See Comments)    Swelling in hands, numbness in arms and joint pain   Gabapentin  Other (See Comments)    Double vision    REVIEW OF SYSTEMS:   Review of Systems  Constitutional:  Negative for chills, fatigue and fever.  HENT:   Negative for lump/mass, mouth sores, nosebleeds, sore throat and trouble swallowing.   Eyes:  Negative for eye  problems.  Respiratory:  Positive for cough and shortness of breath.   Cardiovascular:  Negative for chest pain, leg swelling and palpitations.  Gastrointestinal:  Positive for diarrhea. Negative for abdominal pain, constipation, nausea and vomiting.  Genitourinary:  Negative for bladder incontinence, difficulty urinating, dysuria, frequency, hematuria and nocturia.   Musculoskeletal:  Negative for arthralgias, back pain, flank pain, myalgias and neck pain.  Skin:  Negative for itching and rash.  Neurological:  Positive for dizziness. Negative for headaches and numbness.  Hematological:  Does not bruise/bleed  easily.  Psychiatric/Behavioral:  Positive for sleep disturbance. Negative for depression and suicidal ideas. The patient is not nervous/anxious.   All other systems reviewed and are negative.    VITALS:   Weight 186 lb 1.1 oz (84.4 kg).  Wt Readings from Last 3 Encounters:  01/17/23 186 lb 1.1 oz (84.4 kg)  12/20/22 183 lb (83 kg)  11/22/22 185 lb (83.9 kg)    Body mass index is 30.03 kg/m.  Performance status (ECOG): 1 - Symptomatic but completely ambulatory  PHYSICAL EXAM:   Physical Exam Vitals and nursing note reviewed. Exam conducted with a chaperone present.  Constitutional:      Appearance: Normal appearance.  Cardiovascular:     Rate and Rhythm: Normal rate and regular rhythm.     Pulses: Normal pulses.     Heart sounds: Normal heart sounds.  Pulmonary:     Effort: Pulmonary effort is normal.     Breath sounds: Normal breath sounds.  Abdominal:     Palpations: Abdomen is soft. There is no hepatomegaly, splenomegaly or mass.     Tenderness: There is no abdominal tenderness.  Musculoskeletal:     Right lower leg: No edema.     Left lower leg: No edema.  Lymphadenopathy:     Cervical: No cervical adenopathy.     Right cervical: No superficial, deep or posterior cervical adenopathy.    Left cervical: No superficial, deep or posterior cervical adenopathy.     Upper Body:     Right upper body: No supraclavicular or axillary adenopathy.     Left upper body: No supraclavicular or axillary adenopathy.  Neurological:     General: No focal deficit present.     Mental Status: He is alert and oriented to person, place, and time.  Psychiatric:        Mood and Affect: Mood normal.        Behavior: Behavior normal.     LABS:      Latest Ref Rng & Units 01/17/2023   11:49 AM 12/20/2022    8:10 AM 11/22/2022   11:24 AM  CBC  WBC 4.0 - 10.5 K/uL 6.0  7.1  8.0   Hemoglobin 13.0 - 17.0 g/dL 85.3  85.1  84.6   Hematocrit 39.0 - 52.0 % 42.7  42.6  44.1   Platelets 150  - 400 K/uL 274  234  277       Latest Ref Rng & Units 01/17/2023   11:49 AM 12/20/2022    8:10 AM 11/22/2022   11:24 AM  CMP  Glucose 70 - 99 mg/dL 854  885  95   BUN 8 - 23 mg/dL 5  11  7    Creatinine 0.61 - 1.24 mg/dL 9.25  8.63  9.14   Sodium 135 - 145 mmol/L 127  133  131   Potassium 3.5 - 5.1 mmol/L 4.3  3.9  4.2   Chloride 98 - 111 mmol/L 96  102  98   CO2 22 - 32 mmol/L 20  21  22    Calcium 8.9 - 10.3 mg/dL 8.5  8.9  8.8   Total Protein 6.5 - 8.1 g/dL 6.9  6.9  6.9   Total Bilirubin 0.0 - 1.2 mg/dL 0.5  0.7  0.8   Alkaline Phos 38 - 126 U/L 59  57  65   AST 15 - 41 U/L 28  31  39   ALT 0 - 44 U/L 27  42  53      No results found for: CEA1, CEA / No results found for: CEA1, CEA Lab Results  Component Value Date   PSA1 4.7 (H) 04/12/2022   No results found for: CAN199 No results found for: CAN125  No results found for: TOTALPROTELP, ALBUMINELP, A1GS, A2GS, BETS, BETA2SER, GAMS, MSPIKE, SPEI No results found for: TIBC, FERRITIN, IRONPCTSAT No results found for: LDH   STUDIES:   CT CHEST W CONTRAST Result Date: 01/17/2023 CLINICAL DATA:  Non-small cell lung cancer (NSCLC), monitor. * Tracking Code: BO * EXAM: CT CHEST WITH CONTRAST TECHNIQUE: Multidetector CT imaging of the chest was performed during intravenous contrast administration. RADIATION DOSE REDUCTION: This exam was performed according to the departmental dose-optimization program which includes automated exposure control, adjustment of the mA and/or kV according to patient size and/or use of iterative reconstruction technique. CONTRAST:  75mL OMNIPAQUE  IOHEXOL  300 MG/ML  SOLN COMPARISON:  CT scan chest from 10/01/2022. FINDINGS: Cardiovascular: Normal cardiac size. No pericardial effusion. No aortic aneurysm. There are coronary artery calcifications, in keeping with coronary artery disease. There are also mild-to-moderate peripheral atherosclerotic vascular calcifications of  thoracic aorta and its major branches. Mediastinum/Nodes: Visualized thyroid  gland appears grossly unremarkable. No solid / cystic mediastinal masses. The esophagus is nondistended precluding optimal assessment. No axillary, mediastinal or hilar lymphadenopathy by size criteria. Lungs/Pleura: The central tracheo-bronchial tree is patent. Redemonstration of patient's known pre-existing lung masses. Largest mass centered in the right upper lobe with extension into the middle lobe and lower lobe currently measures 2.7 x 5.1 cm, which previously measured 3.6 x 5.3 cm, when remeasured in similar fashion. The smaller nodule in the central right upper lobe measures 1.1 x 1.7 cm which previously measured 1.0 x 1.9 cm. There are multiple, stable, additional sub 4 mm calcified and noncalcified nodules throughout bilateral lungs. No new or suspicious lung nodule. No mass or consolidation. No pleural effusion or pneumothorax. Upper Abdomen: Visualized upper abdominal viscera within normal limits. Musculoskeletal: A CT Port-a-Cath is seen in the left upper chest wall with the catheter terminating in the cavo-atrial junction region. Visualized soft tissues of the chest wall are otherwise grossly unremarkable. No suspicious osseous lesions. There are mild multilevel degenerative changes in the visualized spine. IMPRESSION: *Continued mild interval decrease in the pre-existing right lung nodules. Please see above for details. No new or suspicious lung nodule. *Multiple other nonacute observations, as described above. Aortic Atherosclerosis (ICD10-I70.0). Electronically Signed   By: Ree Molt M.D.   On: 01/17/2023 13:36

## 2023-01-17 NOTE — Progress Notes (Signed)
 Patient has been examined by Dr. Ellin Saba. Vital signs and labs have been reviewed by MD - ANC, Creatinine, LFTs, hemoglobin, and platelets are within treatment parameters per M.D. - pt may proceed with treatment.  Primary RN and pharmacy notified.

## 2023-01-17 NOTE — Progress Notes (Signed)
 Patient presents today for Imfinzi , patient okay for treatment per Dr. Annetta. Patient tolerated therapy with no complaints voiced. Side effects with management reviewed with understanding verbalized. Port site clean and dry with no bruising or swelling noted at site. Good blood return noted before and after administration of therapy. Band aid applied. Patient left in satisfactory condition with VSS and no s/s of distress noted.

## 2023-01-17 NOTE — Patient Instructions (Addendum)
 [facility] at [facility] Discharge Instructions   You were seen and examined today by Dr. Rogers.  He reviewed the results of your lab work which are normal/stable.   The results of your CT scan are pending. We will call you with the results.   We will proceed with your treatment today.   Return as scheduled.    Thank you for choosing  Cancer Center at [facility] to provide your oncology and hematology care.  To afford each patient quality time with our provider, please arrive at least 15 minutes before your scheduled appointment time.   If you have a lab appointment with the Cancer Center please come in thru the Main Entrance and check in at the main information desk.  You need to re-schedule your appointment should you arrive 10 or more minutes late.  We strive to give you quality time with our providers, and arriving late affects you and other patients whose appointments are after yours.  Also, if you no show three or more times for appointments you may be dismissed from the clinic at the providers discretion.     Again, thank you for choosing [facility].  Our hope is that these requests will decrease the amount of time that you wait before being seen by our physicians.       _____________________________________________________________  Should you have questions after your visit to [facility], please contact our office at [phone] and follow the prompts.  Our office hours are 8:00 a.m. and 4:30 p.m. Monday - Friday.  Please note that voicemails left after 4:00 p.m. may not be returned until the following business day.  We are closed weekends and major holidays.  You do have access to a nurse 24-7, just call the main number to the clinic [phone] and do not press any options, hold on the line and a nurse will answer the phone.    For prescription refill requests, have your pharmacy contact our office and  allow 72 hours.    Due to Covid, you will need to wear a mask upon entering the hospital. If you do not have a mask, a mask will be given to you at the Main Entrance upon arrival. For doctor visits, patients may have 1 support person age 69 or older with them. For treatment visits, patients can not have anyone with them due to social distancing guidelines and our immunocompromised population.

## 2023-01-29 ENCOUNTER — Other Ambulatory Visit: Payer: Self-pay

## 2023-02-14 ENCOUNTER — Inpatient Hospital Stay: Payer: Medicare Other

## 2023-02-14 ENCOUNTER — Inpatient Hospital Stay: Payer: Medicare Other | Admitting: Hematology

## 2023-02-14 ENCOUNTER — Inpatient Hospital Stay: Payer: Medicare Other | Attending: Hematology

## 2023-02-14 VITALS — HR 98

## 2023-02-14 DIAGNOSIS — Z79899 Other long term (current) drug therapy: Secondary | ICD-10-CM | POA: Diagnosis not present

## 2023-02-14 DIAGNOSIS — Z95828 Presence of other vascular implants and grafts: Secondary | ICD-10-CM

## 2023-02-14 DIAGNOSIS — C3491 Malignant neoplasm of unspecified part of right bronchus or lung: Secondary | ICD-10-CM

## 2023-02-14 DIAGNOSIS — Z5112 Encounter for antineoplastic immunotherapy: Secondary | ICD-10-CM | POA: Insufficient documentation

## 2023-02-14 DIAGNOSIS — C3411 Malignant neoplasm of upper lobe, right bronchus or lung: Secondary | ICD-10-CM | POA: Insufficient documentation

## 2023-02-14 LAB — CBC WITH DIFFERENTIAL/PLATELET
Abs Immature Granulocytes: 0.04 10*3/uL (ref 0.00–0.07)
Basophils Absolute: 0.1 10*3/uL (ref 0.0–0.1)
Basophils Relative: 1 %
Eosinophils Absolute: 0.2 10*3/uL (ref 0.0–0.5)
Eosinophils Relative: 2 %
HCT: 41.5 % (ref 39.0–52.0)
Hemoglobin: 14.8 g/dL (ref 13.0–17.0)
Immature Granulocytes: 1 %
Lymphocytes Relative: 6 %
Lymphs Abs: 0.4 10*3/uL — ABNORMAL LOW (ref 0.7–4.0)
MCH: 33.3 pg (ref 26.0–34.0)
MCHC: 35.7 g/dL (ref 30.0–36.0)
MCV: 93.5 fL (ref 80.0–100.0)
Monocytes Absolute: 0.6 10*3/uL (ref 0.1–1.0)
Monocytes Relative: 9 %
Neutro Abs: 5.9 10*3/uL (ref 1.7–7.7)
Neutrophils Relative %: 81 %
Platelets: 254 10*3/uL (ref 150–400)
RBC: 4.44 MIL/uL (ref 4.22–5.81)
RDW: 14.2 % (ref 11.5–15.5)
WBC: 7.1 10*3/uL (ref 4.0–10.5)
nRBC: 0 % (ref 0.0–0.2)

## 2023-02-14 LAB — COMPREHENSIVE METABOLIC PANEL
ALT: 86 U/L — ABNORMAL HIGH (ref 0–44)
AST: 95 U/L — ABNORMAL HIGH (ref 15–41)
Albumin: 4.2 g/dL (ref 3.5–5.0)
Alkaline Phosphatase: 63 U/L (ref 38–126)
Anion gap: 14 (ref 5–15)
BUN: 5 mg/dL — ABNORMAL LOW (ref 8–23)
CO2: 19 mmol/L — ABNORMAL LOW (ref 22–32)
Calcium: 8.9 mg/dL (ref 8.9–10.3)
Chloride: 94 mmol/L — ABNORMAL LOW (ref 98–111)
Creatinine, Ser: 0.77 mg/dL (ref 0.61–1.24)
GFR, Estimated: >60 mL/min (ref >60)
Glucose, Bld: 131 mg/dL — ABNORMAL HIGH (ref 70–99)
Potassium: 4 mmol/L (ref 3.5–5.1)
Sodium: 127 mmol/L — ABNORMAL LOW (ref 135–145)
Total Bilirubin: 0.8 mg/dL (ref 0.0–1.2)
Total Protein: 7.3 g/dL (ref 6.5–8.1)

## 2023-02-14 LAB — TSH: TSH: 0.777 u[IU]/mL (ref 0.350–4.500)

## 2023-02-14 MED ORDER — SODIUM CHLORIDE 0.9% FLUSH
10.0000 mL | INTRAVENOUS | Status: DC | PRN
  Administered 2023-02-14: 10 mL

## 2023-02-14 MED ORDER — SODIUM CHLORIDE 0.9 % IV SOLN: 1500.0000 mg | Freq: Once | INTRAVENOUS | Status: DC

## 2023-02-14 MED ORDER — HEPARIN SOD (PORK) LOCK FLUSH 100 UNIT/ML IV SOLN
500.0000 [IU] | Freq: Once | INTRAVENOUS | Status: AC | PRN
  Administered 2023-02-14: 500 [IU]

## 2023-02-14 MED ORDER — SODIUM CHLORIDE 0.9 % IV SOLN: Freq: Once | INTRAVENOUS | Status: DC

## 2023-02-14 NOTE — Progress Notes (Signed)
 Patient presents today for Imfinzi  infusion per providers order.  AST 95 and ALT 86, MD notified.  Message received from Donzell Gallery RN/Dr. Katragadda, hold treatment today and return in one week for labs and possible treatment.

## 2023-02-15 ENCOUNTER — Other Ambulatory Visit: Payer: Self-pay

## 2023-02-15 LAB — T4: T4, Total: 7.2 ug/dL (ref 4.5–12.0)

## 2023-02-20 ENCOUNTER — Inpatient Hospital Stay: Payer: Medicare Other

## 2023-02-20 VITALS — BP 148/82 | HR 82 | Resp 20

## 2023-02-20 DIAGNOSIS — Z79899 Other long term (current) drug therapy: Secondary | ICD-10-CM | POA: Diagnosis not present

## 2023-02-20 DIAGNOSIS — C3491 Malignant neoplasm of unspecified part of right bronchus or lung: Secondary | ICD-10-CM

## 2023-02-20 DIAGNOSIS — Z95828 Presence of other vascular implants and grafts: Secondary | ICD-10-CM

## 2023-02-20 DIAGNOSIS — C3411 Malignant neoplasm of upper lobe, right bronchus or lung: Secondary | ICD-10-CM | POA: Diagnosis not present

## 2023-02-20 DIAGNOSIS — Z5112 Encounter for antineoplastic immunotherapy: Secondary | ICD-10-CM | POA: Diagnosis not present

## 2023-02-20 LAB — COMPREHENSIVE METABOLIC PANEL
ALT: 48 U/L — ABNORMAL HIGH (ref 0–44)
AST: 33 U/L (ref 15–41)
Albumin: 3.9 g/dL (ref 3.5–5.0)
Alkaline Phosphatase: 56 U/L (ref 38–126)
Anion gap: 9 (ref 5–15)
BUN: 7 mg/dL — ABNORMAL LOW (ref 8–23)
CO2: 21 mmol/L — ABNORMAL LOW (ref 22–32)
Calcium: 8.7 mg/dL — ABNORMAL LOW (ref 8.9–10.3)
Chloride: 100 mmol/L (ref 98–111)
Creatinine, Ser: 0.79 mg/dL (ref 0.61–1.24)
GFR, Estimated: >60 mL/min (ref >60)
Glucose, Bld: 110 mg/dL — ABNORMAL HIGH (ref 70–99)
Potassium: 4 mmol/L (ref 3.5–5.1)
Sodium: 130 mmol/L — ABNORMAL LOW (ref 135–145)
Total Bilirubin: 0.8 mg/dL (ref 0.0–1.2)
Total Protein: 7 g/dL (ref 6.5–8.1)

## 2023-02-20 LAB — CBC WITH DIFFERENTIAL/PLATELET
Abs Immature Granulocytes: 0.04 10*3/uL (ref 0.00–0.07)
Basophils Absolute: 0.1 10*3/uL (ref 0.0–0.1)
Basophils Relative: 1 %
Eosinophils Absolute: 0.2 10*3/uL (ref 0.0–0.5)
Eosinophils Relative: 2 %
HCT: 41.5 % (ref 39.0–52.0)
Hemoglobin: 14.4 g/dL (ref 13.0–17.0)
Immature Granulocytes: 1 %
Lymphocytes Relative: 5 %
Lymphs Abs: 0.4 10*3/uL — ABNORMAL LOW (ref 0.7–4.0)
MCH: 32.7 pg (ref 26.0–34.0)
MCHC: 34.7 g/dL (ref 30.0–36.0)
MCV: 94.3 fL (ref 80.0–100.0)
Monocytes Absolute: 0.9 10*3/uL (ref 0.1–1.0)
Monocytes Relative: 12 %
Neutro Abs: 6.2 10*3/uL (ref 1.7–7.7)
Neutrophils Relative %: 79 %
Platelets: 255 10*3/uL (ref 150–400)
RBC: 4.4 MIL/uL (ref 4.22–5.81)
RDW: 14.3 % (ref 11.5–15.5)
WBC: 7.8 10*3/uL (ref 4.0–10.5)
nRBC: 0 % (ref 0.0–0.2)

## 2023-02-20 LAB — TSH: TSH: 1.292 u[IU]/mL (ref 0.350–4.500)

## 2023-02-20 MED ORDER — HEPARIN SOD (PORK) LOCK FLUSH 100 UNIT/ML IV SOLN
500.0000 [IU] | Freq: Once | INTRAVENOUS | Status: AC | PRN
  Administered 2023-02-20: 500 [IU]

## 2023-02-20 MED ORDER — SODIUM CHLORIDE 0.9 % IV SOLN
1500.0000 mg | Freq: Once | INTRAVENOUS | Status: AC
  Administered 2023-02-20: 1500 mg via INTRAVENOUS
  Filled 2023-02-20: qty 30

## 2023-02-20 MED ORDER — SODIUM CHLORIDE 0.9 % IV SOLN: Freq: Once | INTRAVENOUS | Status: AC

## 2023-02-20 MED ORDER — SODIUM CHLORIDE FLUSH 0.9 % IV SOLN
10.0000 mL | Freq: Once | INTRAVENOUS | Status: AC
  Administered 2023-02-20: 10 mL via INTRAVENOUS
  Filled 2023-02-20: qty 10

## 2023-02-20 NOTE — Progress Notes (Signed)
Per pt he developed a cough yesterday that is now starting to produce white sputum but denies any achy feelings, fever or chest pain. RN educated pt on when to notify the clinic if any further complications occur and when to seek emergency care. Pt verbalized understanding and all questions answered at this time.   Patient tolerated chemotherapy with no complaints voiced.  Side effects with management reviewed with understanding verbalized.  Port site clean and dry with no bruising or swelling noted at site.  Good blood return noted before and after administration of chemotherapy.  Band aid applied.  Patient left in satisfactory condition with VSS and no s/s of distress noted. All follow ups as scheduled.  Bill Mcvey [employer]

## 2023-02-20 NOTE — Patient Instructions (Signed)
[facility] [place] - A DEPT OF [facility]  Discharge Instructions: Thank you for choosing [facility] to provide your oncology and hematology care.  If you have a lab appointment with the Cancer Center - please note that after April 8th, 2024, all labs will be drawn in the cancer center.  You do not have to check in or register with the main entrance as you have in the past but will complete your check-in in the cancer center.  Wear comfortable clothing and clothing appropriate for easy access to any Portacath or PICC line.   We strive to give you quality time with your provider. You may need to reschedule your appointment if you arrive late (15 or more minutes).  Arriving late affects you and other patients whose appointments are after yours.  Also, if you miss three or more appointments without notifying the office, you may be dismissed from the clinic at the provider's discretion.      For prescription refill requests, have your pharmacy contact our office and allow 72 hours for refills to be completed.    Today you received the following chemotherapy and/or immunotherapy agents imfiniz   To help prevent nausea and vomiting after your treatment, we encourage you to take your nausea medication as directed.  BELOW ARE SYMPTOMS THAT SHOULD BE REPORTED IMMEDIATELY: *FEVER GREATER THAN 100.4 F (38 C) OR HIGHER *CHILLS OR SWEATING *NAUSEA AND VOMITING THAT IS NOT CONTROLLED WITH YOUR NAUSEA MEDICATION *UNUSUAL SHORTNESS OF BREATH *UNUSUAL BRUISING OR BLEEDING *URINARY PROBLEMS (pain or burning when urinating, or frequent urination) *BOWEL PROBLEMS (unusual diarrhea, constipation, pain near the anus) TENDERNESS IN MOUTH AND THROAT WITH OR WITHOUT PRESENCE OF ULCERS (sore throat, sores in mouth, or a toothache) UNUSUAL RASH, SWELLING OR PAIN  UNUSUAL VAGINAL DISCHARGE OR ITCHING   Items with * indicate a potential emergency and should be followed up as  soon as possible or go to the Emergency Department if any problems should occur.  Please show the CHEMOTHERAPY ALERT CARD or IMMUNOTHERAPY ALERT CARD at check-in to the Emergency Department and triage nurse.  Should you have questions after your visit or need to cancel or reschedule your appointment, please contact [facility] [place] - A DEPT OF Eligha Bridegroom [facility] [phone]  and follow the prompts.  Office hours are 8:00 a.m. to 4:30 p.m. Monday - Friday. Please note that voicemails left after 4:00 p.m. may not be returned until the following business day.  We are closed weekends and major holidays. You have access to a nurse at all times for urgent questions. Please call the main number to the clinic [phone] and follow the prompts.  For any non-urgent questions, you may also contact your provider using MyChart. We now offer e-Visits for anyone 38 and older to request care online for non-urgent symptoms. For details visit [email or web address].   Also download the MyChart app! Go to the app store, search "MyChart", open the app, select [place], and log in with your MyChart username and password.

## 2023-02-21 LAB — T4: T4, Total: 6.2 ug/dL (ref 4.5–12.0)

## 2023-03-05 ENCOUNTER — Other Ambulatory Visit: Payer: Self-pay

## 2023-03-11 ENCOUNTER — Other Ambulatory Visit: Payer: Self-pay | Admitting: *Deleted

## 2023-03-11 DIAGNOSIS — R197 Diarrhea, unspecified: Secondary | ICD-10-CM

## 2023-03-11 MED ORDER — LOPERAMIDE HCL 2 MG PO CAPS: 2.0000 mg | ORAL_CAPSULE | ORAL | 1 refills | Status: AC | PRN

## 2023-03-14 ENCOUNTER — Inpatient Hospital Stay: Payer: Medicare Other

## 2023-03-14 ENCOUNTER — Inpatient Hospital Stay: Payer: Medicare Other | Admitting: Hematology

## 2023-03-19 ENCOUNTER — Other Ambulatory Visit: Payer: Self-pay | Admitting: Physician Assistant

## 2023-03-21 ENCOUNTER — Other Ambulatory Visit: Payer: Self-pay | Admitting: *Deleted

## 2023-03-21 ENCOUNTER — Encounter: Payer: Self-pay | Admitting: Hematology

## 2023-03-21 ENCOUNTER — Inpatient Hospital Stay (HOSPITAL_BASED_OUTPATIENT_CLINIC_OR_DEPARTMENT_OTHER): Payer: Medicare Other | Admitting: Hematology

## 2023-03-21 ENCOUNTER — Inpatient Hospital Stay: Payer: Medicare Other

## 2023-03-21 ENCOUNTER — Inpatient Hospital Stay: Payer: Medicare Other | Attending: Hematology

## 2023-03-21 VITALS — BP 155/77 | HR 76 | Temp 97.8°F | Resp 18

## 2023-03-21 VITALS — BP 143/86 | HR 82 | Temp 97.7°F | Resp 18 | Wt 176.4 lb

## 2023-03-21 DIAGNOSIS — C3491 Malignant neoplasm of unspecified part of right bronchus or lung: Secondary | ICD-10-CM

## 2023-03-21 DIAGNOSIS — F32A Depression, unspecified: Secondary | ICD-10-CM | POA: Diagnosis not present

## 2023-03-21 DIAGNOSIS — F17210 Nicotine dependence, cigarettes, uncomplicated: Secondary | ICD-10-CM | POA: Diagnosis not present

## 2023-03-21 DIAGNOSIS — Z5112 Encounter for antineoplastic immunotherapy: Secondary | ICD-10-CM | POA: Diagnosis not present

## 2023-03-21 DIAGNOSIS — D3502 Benign neoplasm of left adrenal gland: Secondary | ICD-10-CM | POA: Diagnosis not present

## 2023-03-21 DIAGNOSIS — C3411 Malignant neoplasm of upper lobe, right bronchus or lung: Secondary | ICD-10-CM | POA: Diagnosis not present

## 2023-03-21 DIAGNOSIS — F419 Anxiety disorder, unspecified: Secondary | ICD-10-CM | POA: Insufficient documentation

## 2023-03-21 DIAGNOSIS — Z95828 Presence of other vascular implants and grafts: Secondary | ICD-10-CM

## 2023-03-21 DIAGNOSIS — Z79899 Other long term (current) drug therapy: Secondary | ICD-10-CM | POA: Diagnosis not present

## 2023-03-21 DIAGNOSIS — R748 Abnormal levels of other serum enzymes: Secondary | ICD-10-CM | POA: Insufficient documentation

## 2023-03-21 DIAGNOSIS — J449 Chronic obstructive pulmonary disease, unspecified: Secondary | ICD-10-CM | POA: Insufficient documentation

## 2023-03-21 LAB — COMPREHENSIVE METABOLIC PANEL
ALT: 54 U/L — ABNORMAL HIGH (ref 0–44)
AST: 51 U/L — ABNORMAL HIGH (ref 15–41)
Albumin: 3.8 g/dL (ref 3.5–5.0)
Alkaline Phosphatase: 72 U/L (ref 38–126)
Anion gap: 12 (ref 5–15)
BUN: 8 mg/dL (ref 8–23)
CO2: 21 mmol/L — ABNORMAL LOW (ref 22–32)
Calcium: 9 mg/dL (ref 8.9–10.3)
Chloride: 97 mmol/L — ABNORMAL LOW (ref 98–111)
Creatinine, Ser: 0.81 mg/dL (ref 0.61–1.24)
GFR, Estimated: >60 mL/min (ref >60)
Glucose, Bld: 111 mg/dL — ABNORMAL HIGH (ref 70–99)
Potassium: 4 mmol/L (ref 3.5–5.1)
Sodium: 130 mmol/L — ABNORMAL LOW (ref 135–145)
Total Bilirubin: 1.1 mg/dL (ref 0.0–1.2)
Total Protein: 7 g/dL (ref 6.5–8.1)

## 2023-03-21 LAB — CBC WITH DIFFERENTIAL/PLATELET
Abs Immature Granulocytes: 0.05 10*3/uL (ref 0.00–0.07)
Basophils Absolute: 0.1 10*3/uL (ref 0.0–0.1)
Basophils Relative: 1 %
Eosinophils Absolute: 0.1 10*3/uL (ref 0.0–0.5)
Eosinophils Relative: 1 %
HCT: 42.4 % (ref 39.0–52.0)
Hemoglobin: 14.5 g/dL (ref 13.0–17.0)
Immature Granulocytes: 1 %
Lymphocytes Relative: 5 %
Lymphs Abs: 0.4 10*3/uL — ABNORMAL LOW (ref 0.7–4.0)
MCH: 32.2 pg (ref 26.0–34.0)
MCHC: 34.2 g/dL (ref 30.0–36.0)
MCV: 94.2 fL (ref 80.0–100.0)
Monocytes Absolute: 0.9 10*3/uL (ref 0.1–1.0)
Monocytes Relative: 11 %
Neutro Abs: 6.7 10*3/uL (ref 1.7–7.7)
Neutrophils Relative %: 81 %
Platelets: 257 10*3/uL (ref 150–400)
RBC: 4.5 MIL/uL (ref 4.22–5.81)
RDW: 14 % (ref 11.5–15.5)
WBC: 8.2 10*3/uL (ref 4.0–10.5)
nRBC: 0 % (ref 0.0–0.2)

## 2023-03-21 LAB — TSH: TSH: 1.39 u[IU]/mL (ref 0.350–4.500)

## 2023-03-21 LAB — MAGNESIUM: Magnesium: 2 mg/dL (ref 1.7–2.4)

## 2023-03-21 MED ORDER — SODIUM CHLORIDE 0.9 % IV SOLN: Freq: Once | INTRAVENOUS | Status: AC

## 2023-03-21 MED ORDER — SIMVASTATIN 20 MG PO TABS: 20.0000 mg | ORAL_TABLET | Freq: Every day | ORAL | 3 refills | Status: AC

## 2023-03-21 MED ORDER — CLONAZEPAM 1 MG PO TBDP: ORAL_TABLET | ORAL | 2 refills | Status: DC

## 2023-03-21 MED ORDER — SODIUM CHLORIDE 0.9% FLUSH
10.0000 mL | INTRAVENOUS | Status: DC | PRN
  Administered 2023-03-21: 10 mL

## 2023-03-21 MED ORDER — HEPARIN SOD (PORK) LOCK FLUSH 100 UNIT/ML IV SOLN
500.0000 [IU] | Freq: Once | INTRAVENOUS | Status: AC | PRN
  Administered 2023-03-21: 500 [IU]

## 2023-03-21 MED ORDER — SODIUM CHLORIDE 0.9 % IV SOLN
1500.0000 mg | Freq: Once | INTRAVENOUS | Status: AC
  Administered 2023-03-21: 1500 mg via INTRAVENOUS
  Filled 2023-03-21: qty 30

## 2023-03-21 MED ORDER — SODIUM CHLORIDE FLUSH 0.9 % IV SOLN
10.0000 mL | Freq: Once | INTRAVENOUS | Status: AC
  Administered 2023-03-21: 10 mL via INTRAVENOUS
  Filled 2023-03-21: qty 10

## 2023-03-21 MED ORDER — CLONAZEPAM 1 MG PO TABS: ORAL_TABLET | ORAL | 0 refills | Status: DC

## 2023-03-21 NOTE — Patient Instructions (Signed)
 [facility] [place] [facility]  Discharge Instructions: Thank you for choosing [facility] to provide your oncology and hematology care.  If you have a lab appointment with the Cancer Center - please note that after April 8th, 2024, all labs will be drawn in the cancer center.  You do not have to check in or register with the main entrance as you have in the past but will complete your check-in in the cancer center.  Wear comfortable clothing and clothing appropriate for easy access to any Portacath or PICC line.   We strive to give you quality time with your provider. You may need to reschedule your appointment if you arrive late (15 or more minutes).  Arriving late affects you and other patients whose appointments are after yours.  Also, if you miss three or more appointments without notifying the office, you may be dismissed from the clinic at the provider's discretion.      For prescription refill requests, have your pharmacy contact our office and allow 72 hours for refills to be completed.    Today you received the following chemotherapy and/or immunotherapy agents Imfinzi   To help prevent nausea and vomiting after your treatment, we encourage you to take your nausea medication as directed.  Durvalumab Injection What is this medication? DURVALUMAB (dur VAL ue mab) treats some types of cancer. It works by helping your immune system slow or stop the spread of cancer cells. It is a monoclonal antibody. This medicine may be used for other purposes; ask your health care provider or pharmacist if you have questions. COMMON BRAND NAME(S): IMFINZI What should I tell my care team before I take this medication? They need to know if you have any of these conditions: Allogeneic stem cell transplant (uses someone else's stem cells) Autoimmune diseases, such as Crohn disease, ulcerative colitis, lupus History of chest radiation Nervous system  problems, such as Guillain-Barre syndrome, myasthenia gravis Organ transplant An unusual or allergic reaction to durvalumab, other medications, foods, dyes, or preservatives Pregnant or trying to get pregnant Breast-feeding How should I use this medication? This medication is infused into a vein. It is given by your care team in a hospital or clinic setting. A special MedGuide will be given to you before each treatment. Be sure to read this information carefully each time. Talk to your care team about the use of this medication in children. Special care may be needed. Overdosage: If you think you have taken too much of this medicine contact a poison control center or emergency room at once. NOTE: This medicine is only for you. Do not share this medicine with others. What if I miss a dose? Keep appointments for follow-up doses. It is important not to miss your dose. Call your care team if you are unable to keep an appointment. What may interact with this medication? Interactions have not been studied. This list may not describe all possible interactions. Give your health care provider a list of all the medicines, herbs, non-prescription drugs, or dietary supplements you use. Also tell them if you smoke, drink alcohol, or use illegal drugs. Some items may interact with your medicine. What should I watch for while using this medication? Your condition will be monitored carefully while you are receiving this medication. You may need blood work while taking this medication. This medication may cause serious skin reactions. They can happen weeks to months after starting the medication. Contact  your care team right away if you notice fevers or flu-like symptoms with a rash. The rash may be red or purple and then turn into blisters or peeling of the skin. You may also notice a red rash with swelling of the face, lips, or lymph nodes in your neck or under your arms. Tell your care team right away if you  have any change in your eyesight. Talk to your care team if you may be pregnant. Serious birth defects can occur if you take this medication during pregnancy and for 3 months after the last dose. You will need a negative pregnancy test before starting this medication. Contraception is recommended while taking this medication and for 3 months after the last dose. Your care team can help you find the option that works for you. Do not breastfeed while taking this medication and for 3 months after the last dose. What side effects may I notice from receiving this medication? Side effects that you should report to your care team as soon as possible: Allergic reactions--skin rash, itching, hives, swelling of the face, lips, tongue, or throat Dry cough, shortness of breath or trouble breathing Eye pain, redness, irritation, or discharge with blurry or decreased vision Heart muscle inflammation--unusual weakness or fatigue, shortness of breath, chest pain, fast or irregular heartbeat, dizziness, swelling of the ankles, feet, or hands Hormone gland problems--headache, sensitivity to light, unusual weakness or fatigue, dizziness, fast or irregular heartbeat, increased sensitivity to cold or heat, excessive sweating, constipation, hair loss, increased thirst or amount of urine, tremors or shaking, irritability Infusion reactions--chest pain, shortness of breath or trouble breathing, feeling faint or lightheaded Kidney injury (glomerulonephritis)--decrease in the amount of urine, red or dark brown urine, foamy or bubbly urine, swelling of the ankles, hands, or feet Liver injury--right upper belly pain, loss of appetite, nausea, light-colored stool, dark yellow or brown urine, yellowing skin or eyes, unusual weakness or fatigue Pain, tingling, or numbness in the hands or feet, muscle weakness, change in vision, confusion or trouble speaking, loss of balance or coordination, trouble walking, seizures Rash, fever, and  swollen lymph nodes Redness, blistering, peeling, or loosening of the skin, including inside the mouth Sudden or severe stomach pain, bloody diarrhea, fever, nausea, vomiting Side effects that usually do not require medical attention (report these to your care team if they continue or are bothersome): Bone, joint, or muscle pain Diarrhea Fatigue Loss of appetite Nausea Skin rash This list may not describe all possible side effects. Call your doctor for medical advice about side effects. You may report side effects to FDA at 1-800-FDA-1088. Where should I keep my medication? This medication is given in a hospital or clinic. It will not be stored at home. NOTE: This sheet is a summary. It may not cover all possible information. If you have questions about this medicine, talk to your doctor, pharmacist, or health care provider.  2024 Elsevier/Gold Standard (2021-05-09 00:00:00)   BELOW ARE SYMPTOMS THAT SHOULD BE REPORTED IMMEDIATELY: *FEVER GREATER THAN 100.4 F (38 C) OR HIGHER *CHILLS OR SWEATING *NAUSEA AND VOMITING THAT IS NOT CONTROLLED WITH YOUR NAUSEA MEDICATION *UNUSUAL SHORTNESS OF BREATH *UNUSUAL BRUISING OR BLEEDING *URINARY PROBLEMS (pain or burning when urinating, or frequent urination) *BOWEL PROBLEMS (unusual diarrhea, constipation, pain near the anus) TENDERNESS IN MOUTH AND THROAT WITH OR WITHOUT PRESENCE OF ULCERS (sore throat, sores in mouth, or a toothache) UNUSUAL RASH, SWELLING OR PAIN  UNUSUAL VAGINAL DISCHARGE OR ITCHING   Items with * indicate a  potential emergency and should be followed up as soon as possible or go to the Emergency Department if any problems should occur.  Please show the CHEMOTHERAPY ALERT CARD or IMMUNOTHERAPY ALERT CARD at check-in to the Emergency Department and triage nurse.  Should you have questions after your visit or need to cancel or reschedule your appointment, please contact [facility] [place] - A DEPT OF Eligha Bridegroom [facility] [phone]  and follow the prompts.  Office hours are 8:00 a.m. to 4:30 p.m. Monday - Friday. Please note that voicemails left after 4:00 p.m. may not be returned until the following business day.  We are closed weekends and major holidays. You have access to a nurse at all times for urgent questions. Please call the main number to the clinic [phone] and follow the prompts.  For any non-urgent questions, you may also contact your provider using MyChart. We now offer e-Visits for anyone 52 and older to request care online for non-urgent symptoms. For details visit [email or web address].   Also download the MyChart app! Go to the app store, search "MyChart", open the app, select [place], and log in with your MyChart username and password.

## 2023-03-21 NOTE — Patient Instructions (Signed)

## 2023-03-21 NOTE — Progress Notes (Signed)
 [facility] [address], [place] [address]    Clinic Day:  03/21/23   Referring physician: Benita Stabile, MD  Patient Care Team: Benita Stabile, MD as PCP - General (Internal Medicine) Doreatha Massed, MD as Medical Oncologist (Medical Oncology) Therese Sarah, RN as Oncology Nurse Navigator (Medical Oncology)   ASSESSMENT & PLAN:   Assessment: 1.  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  No hemoptysis/weight loss. - Chest x-ray (04/10/2022): Mid right lung mass. - CT chest (04/13/2022): 9.2 x 5.7 x 6.6 cm right upper lobe mass with lobulations appearing to extend across the minor fissure into the right middle lobe and right lower lobe.  Satellite lesion in the right upper lobe measures 1.6 x 1.4 x 1.4 cm.  No findings of chest wall invasion.  Borderline prominent lower right paratracheal lymph node 0.9 cm.  Old granulomatous disease.  Small left adrenal adenoma. - Has right posterior chest wall pain for the past 6 months, dull type.  He is not requiring any pain medication. - Brain MRI (04/19/2022): No metastatic disease. - PET scan (04/19/2022): 9 x 5.7 cm right upper lobe mass with SUV 11.8.  1.3 x 0.9 cm satellite nodule in the right upper lobe with SUV 3.8.  No adenopathy or distant metastatic disease. - Bronchoscopy and biopsy by Dr. Delton Coombes - Pathology (04/23/2022): Both right upper lobe lung nodule FNA consistent with poorly differentiated carcinoma.  IHC diffusely positive for TTF-1, Napsin, synaptophysin and CK7.  CD56 negative.  P63 and CK5/6 negative.  Given Napsin positivity, favor adenocarcinoma.  There is also diffuse synaptophysin positivity consistent with neuroendocrine differentiation.  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 - Discussed with Dr. Dorris Fetch.  Not a surgical candidate as he requires pneumonectomy and has  suboptimal DLCO. - Chemoradiation with carboplatin and paclitaxel from 05/22/2022 through 07/06/2022 - Consolidation durvalumab started on 07/27/2022   2.  Social/family history: - He is seen with his 2 sisters today.  Lives by himself and is independent of ADLs and IADLs.  He retired after working at [employer] for the last 34 years.  No asbestos exposure.  No other chemicals.  Smokes more than 1 pack/day for the last 50 years (started at age 44) quit smoking for 5 years before starting back again. - Brother died of lung cancer.  Father had bladder cancer.  Maternal uncle had cancer.    Plan: 1.  Stage IIIa adenocarcinoma of the right lung: - CT chest (01/10/2023): Right upper lobe lung mass measures 2.7 x 5.1 cm previously 3.6 x 5.3 cm.  Small nodule in the central right upper lobe measures 1.1 x 1.7 cm, previously 1 x 1.9 cm.  Multiple stable bilateral lung nodules.  No evidence of progression. - He is tolerating durvalumab without any immunotherapy related side effects. - He had 1 episode of diarrhea x 9 3 to 4 months ago.  Since then he did not have any diarrhea.  Chronic dry cough is stable.  Chronic shortness of breath is also stable. - Reviewed labs from today: AST and ALT elevated at 51 and 54.  Creatinine normal.  CBC grossly normal.  TSH is 1.39. - He may proceed with durvalumab today and in 4 weeks.  RTC 8 weeks for follow-up with repeat CT chest with contrast.    2.  Anxiety/depression: - Continue clonazepam  1 mg twice daily which is helping.  I have sent a refill.   3.  COPD: - Continue Tessalon Perles as needed as they help with his cough.    Orders Placed This Encounter  Procedures   CT CHEST W CONTRAST    Standing Status:   Future    Expected Date:   05/21/2023    Expiration Date:   03/20/2024    If indicated for the ordered procedure, I authorize the administration of contrast media per Radiology protocol:   Yes    Does the patient have a contrast media/X-ray dye  allergy?:   No    Preferred imaging location?:   [facility] R Teague,acting as a scribe for Doreatha Massed, MD.,have documented all relevant documentation on the behalf of Doreatha Massed, MD,as directed by  Doreatha Massed, MD while in the presence of Doreatha Massed, MD.  I, Doreatha Massed MD, have reviewed the above documentation for accuracy and completeness, and I agree with the above.      Doreatha Massed, MD   3/13/202511:32 AM  CHIEF COMPLAINT:   Diagnosis: RUL lung adenocarcinoma    Cancer Staging  Non-small cell cancer of right lung [facility]) Staging form: Lung, AJCC 8th Edition - Clinical stage from 05/01/2022: Stage IIIA (cT4, cN0, cM0) - Unsigned    Prior Therapy: none  Current Therapy:  concurrent chemoradiation with carboplatin + paclitaxel    HISTORY OF PRESENT ILLNESS:   Oncology History  Non-small cell cancer of right lung (HCC)  05/01/2022 Initial Diagnosis   Non-small cell cancer of right lung (HCC)   05/22/2022 - 07/05/2022 Chemotherapy   Patient is on Treatment Plan : LUNG Carboplatin + Paclitaxel + XRT q7d     07/27/2022 -  Chemotherapy   Patient is on Treatment Plan : LUNG Durvalumab q 28 day (beginning 11/22/22)        INTERVAL HISTORY:   Carlos [name] is a 68 y.o. male presenting to clinic today for follow up of RUL lung adenocarcinoma. He was last seen by me on 01/17/23.  Today, he states that he is doing well overall. His appetite level is at 100%. His energy level is at 75%. He is accompanied by a family member.   Kamaury would like a refill of his cholesterol medications. Since his last visit, he has unintentionally lost 9 pounds and notes a normal appetite. Sabas notes mild diarrhea with soft stools, though he had one day of 9 episodes of diarrhea with watery stools that occurred 3-4 months ago.   Decorian reports a cough and stable SOB. He is requiring Tessalon Perles to resolve coughing fits.    PAST MEDICAL HISTORY:   Past Medical History: Past Medical History:  Diagnosis Date   Anxiety    Cancer (HCC)    Skin cancer left shoulder   Chronic low back pain    Complication of anesthesia    Dermatofibrosarcoma protubera of right shoulder    Dyspnea    GERD (gastroesophageal reflux disease)    Hyperlipidemia    Lung cancer (HCC) 04/23/2022   PONV (postoperative nausea and vomiting)    Port-A-Cath in place 05/17/2022   Prostate cancer [facility])     Surgical History: Past Surgical History:  Procedure Laterality Date   APPENDECTOMY     APPLICATION OF A-CELL OF EXTREMITY Right 07/09/2012   Procedure: PLACEMENT OF A-CELL TO UPPER EXTREMITY/PLACEMENT OF VAC;  Surgeon: Wayland Denis, DO;  Location: [facility];  Service:  Plastics;  Laterality: Right;   BRONCHIAL BRUSHINGS  04/23/2022   Procedure: BRONCHIAL BRUSHINGS;  Surgeon: Leslye Peer, MD;  Location: [facility] ENDOSCOPY;  Service: Pulmonary;;   BRONCHIAL NEEDLE ASPIRATION BIOPSY  04/23/2022   Procedure: BRONCHIAL NEEDLE ASPIRATION BIOPSIES;  Surgeon: Leslye Peer, MD;  Location: [facility] ENDOSCOPY;  Service: Pulmonary;;   COLONOSCOPY WITH PROPOFOL N/A 09/30/2015   Procedure: COLONOSCOPY WITH PROPOFOL;  Surgeon: Malissa Hippo, MD;  Location: [facility];  Service: Endoscopy;  Laterality: N/A;  830   EXCISION DERMATOFIBROCARCOMA PROTUBERAN RIGHT SHOULDER  2 - 3 WKS AGO   HEMOSTASIS CONTROL  04/23/2022   Procedure: HEMOSTASIS CONTROL;  Surgeon: Leslye Peer, MD;  Location: [facility] ENDOSCOPY;  Service: Pulmonary;;   INGUINAL HERNIA REPAIR Left 1999   IR IMAGING GUIDED PORT INSERTION  04/30/2022   KNEE ARTHROSCOPY Left 2012   POLYPECTOMY  09/30/2015   Procedure: POLYPECTOMY;  Surgeon: Malissa Hippo, MD;  Location: [facility];  Service: Endoscopy;;  sigmoid   VIDEO BRONCHOSCOPY WITH ENDOBRONCHIAL ULTRASOUND Bilateral 04/23/2022   Procedure: VIDEO BRONCHOSCOPY WITH ENDOBRONCHIAL ULTRASOUND;  Surgeon: Leslye Peer, MD;   Location: [facility]COPY;  Service: Pulmonary;  Laterality: Bilateral;   VIDEO BRONCHOSCOPY WITH RADIAL ENDOBRONCHIAL ULTRASOUND  04/23/2022   Procedure: VIDEO BRONCHOSCOPY WITH RADIAL ENDOBRONCHIAL ULTRASOUND;  Surgeon: Leslye Peer, MD;  Location: [facility];  Service: Pulmonary;;    Social History: Social History   Socioeconomic History   Marital status: Divorced    Spouse name: Burna Mortimer   Number of children: 1   Years of education: 10th   Highest education level: Not on file  Occupational History    Employer: [employer]    Comment: [employer]  Tobacco Use   Smoking status: Every Day    Current packs/day: 1.00    Average packs/day: 1 pack/day for 40.0 years (40.0 ttl pk-yrs)    Types: Cigarettes   Smokeless tobacco: Never  Vaping Use   Vaping status: Never Used  Substance and Sexual Activity   Alcohol use: Yes    Comment: 2-3 cans beer/day   Drug use: No   Sexual activity: Not on file  Other Topics Concern   Not on file  Social History Narrative   Patient lives at home with his dog. 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 Patient has 10 grade education.Patient is right-handed.Caffeine Use: 1 cup of coffee; 3-4 sodas daily   Social Drivers of Health   Financial Resource Strain: High Risk (05/10/2022)   Overall Financial Resource Strain (CARDIA)    Difficulty of Paying Living Expenses: Very hard  Food Insecurity: Food Insecurity Present (05/04/2022)   Hunger Vital Sign    Worried About Running Out of Food in the Last Year: Sometimes true    Ran Out of Food in the Last Year: Sometimes true  Transportation Needs: No Transportation Needs (05/04/2022)   PRAPARE - Administrator, Civil Service (Medical): No    Lack of Transportation (Non-Medical): No  Physical Activity: Not on file  Stress: Not on file  Social Connections: Not on file  Intimate Partner Violence: Not At Risk (05/04/2022)   Humiliation, Afraid,  Rape, and Kick questionnaire    Fear of Current or Ex-Partner: No    Emotionally Abused: No    Physically Abused: No    Sexually Abused: No    Family History: Family History  Problem Relation Age of Onset   Dementia Mother  Cancer Father     Current Medications:  Current Outpatient Medications:    acetaminophen (TYLENOL) 500 MG tablet, Take 1,000 mg by mouth every 6 (six) hours as needed (pain.)., Disp: , Rfl:    albuterol (VENTOLIN HFA) 108 (90 Base) MCG/ACT inhaler, Inhale 2 puffs into the lungs every 6 (six) hours as needed for wheezing or shortness of breath., Disp: 8 g, Rfl: 6   aspirin EC 81 MG tablet, Take 81 mg by mouth at bedtime. Swallow whole., Disp: , Rfl:    benzonatate (TESSALON) 200 MG capsule, Take 1 capsule by mouth three times daily as needed for cough, Disp: 120 capsule, Rfl: 0   clonazePAM (KLONOPIN) 1 MG disintegrating tablet, Take one tablet by mouth every morning and two tablets by mouth at bedtime, Disp: 90 tablet, Rfl: 2   Durvalumab (IMFINZI IV), Inject into the vein every 14 (fourteen) days., Disp: , Rfl:    lidocaine-prilocaine (EMLA) cream, Apply a quarter-sized amount to port a cath site and cover with plastic wrap 1 hour prior to infusion appointments, Disp: 30 g, Rfl: 3   loperamide (IMODIUM) 2 MG capsule, Take 1 capsule (2 mg total) by mouth as needed for diarrhea or loose stools (Take 2 capsules after the first loose stool and then 1 capsule after each loose stool bt do not exceed more than 8 capsules in a 24 hr period. If it is bedtime take 2 capsules at bedtime and then take 2 capsules every 4 hours until morning)., Disp: 30 capsule, Rfl: 1   meloxicam (MOBIC) 15 MG tablet, Take 15 mg by mouth in the morning., Disp: , Rfl:    pantoprazole (PROTONIX) 40 MG tablet, Take 40 mg by mouth daily., Disp: , Rfl:    prochlorperazine (COMPAZINE) 10 MG tablet, Take 10 mg by mouth every 6 (six) hours as needed., Disp: , Rfl:    sucralfate (CARAFATE) 1 g tablet,  Take 1 tablet (1 g total) by mouth 4 (four) times daily. Dissolve each tablet in 15 cc water before use., Disp: 120 tablet, Rfl: 2   tamsulosin (FLOMAX) 0.4 MG CAPS capsule, Take 1 capsule (0.4 mg total) by mouth daily. (Patient taking differently: Take 0.4 mg by mouth at bedtime.), Disp: 30 capsule, Rfl: 11   simvastatin (ZOCOR) 20 MG tablet, Take 1 tablet (20 mg total) by mouth at bedtime., Disp: 90 tablet, Rfl: 3   Allergies: Allergies  Allergen Reactions   Ciprofloxacin Swelling and Other (See Comments)    Swelling in hands, numbness in arms and joint pain   Gabapentin Other (See Comments)    Double vision    REVIEW OF SYSTEMS:   Review of Systems  Constitutional:  Negative for chills, fatigue and fever.  HENT:   Negative for lump/mass, mouth sores, nosebleeds, sore throat and trouble swallowing.   Eyes:  Negative for eye problems.  Respiratory:  Positive for cough and shortness of breath.   Cardiovascular:  Negative for chest pain, leg swelling and palpitations.  Gastrointestinal:  Positive for diarrhea. Negative for abdominal pain, constipation, nausea and vomiting.  Genitourinary:  Negative for bladder incontinence, difficulty urinating, dysuria, frequency, hematuria and nocturia.   Musculoskeletal:  Negative for arthralgias, back pain, flank pain, myalgias and neck pain.  Skin:  Negative for itching and rash.  Neurological:  Positive for dizziness. Negative for headaches and numbness.  Hematological:  Does not bruise/bleed easily.  Psychiatric/Behavioral:  Positive for sleep disturbance. Negative for depression and suicidal ideas. The patient is not nervous/anxious.  All other systems reviewed and are negative.    VITALS:   Blood pressure (!) 143/86, pulse 82, temperature 97.7 F (36.5 C), temperature source Oral, resp. rate 18, weight 176 lb 5.9 oz (80 kg), SpO2 99%.  Wt Readings from Last 3 Encounters:  03/21/23 176 lb 5.9 oz (80 kg)  02/20/23 182 lb 3.2 oz (82.6 kg)   02/14/23 184 lb 3.2 oz (83.6 kg)    Body mass index is 28.47 kg/m.  Performance status (ECOG): 1 - Symptomatic but completely ambulatory  PHYSICAL EXAM:   Physical Exam Vitals and nursing note reviewed. Exam conducted with a chaperone present.  Constitutional:      Appearance: Normal appearance.  Cardiovascular:     Rate and Rhythm: Normal rate and regular rhythm.     Pulses: Normal pulses.     Heart sounds: Normal heart sounds.  Pulmonary:     Effort: Pulmonary effort is normal.     Breath sounds: Normal breath sounds.  Abdominal:     Palpations: Abdomen is soft. There is no hepatomegaly, splenomegaly or mass.     Tenderness: There is no abdominal tenderness.  Musculoskeletal:     Right lower leg: No edema.     Left lower leg: No edema.  Lymphadenopathy:     Cervical: No cervical adenopathy.     Right cervical: No superficial, deep or posterior cervical adenopathy.    Left cervical: No superficial, deep or posterior cervical adenopathy.     Upper Body:     Right upper body: No supraclavicular or axillary adenopathy.     Left upper body: No supraclavicular or axillary adenopathy.  Neurological:     General: No focal deficit present.     Mental Status: He is alert and oriented to person, place, and time.  Psychiatric:        Mood and Affect: Mood normal.        Behavior: Behavior normal.    LABS:      Latest Ref Rng & Units 03/21/2023    9:58 AM 02/20/2023    8:01 AM 02/14/2023    1:01 PM  CBC  WBC 4.0 - 10.5 K/uL 8.2  7.8  7.1   Hemoglobin 13.0 - 17.0 g/dL 56.4  33.2  95.1   Hematocrit 39.0 - 52.0 % 42.4  41.5  41.5   Platelets 150 - 400 K/uL 257  255  254       Latest Ref Rng & Units 03/21/2023    9:58 AM 02/20/2023    8:01 AM 02/14/2023    1:01 PM  CMP  Glucose 70 - 99 mg/dL 884  166  063   BUN 8 - 23 mg/dL 8  7  5    Creatinine 0.61 - 1.24 mg/dL 0.16  0.10  9.32   Sodium 135 - 145 mmol/L 130  130  127   Potassium 3.5 - 5.1 mmol/L 4.0  4.0  4.0   Chloride  98 - 111 mmol/L 97  100  94   CO2 22 - 32 mmol/L 21  21  19    Calcium 8.9 - 10.3 mg/dL 9.0  8.7  8.9   Total Protein 6.5 - 8.1 g/dL 7.0  7.0  7.3   Total Bilirubin 0.0 - 1.2 mg/dL 1.1  0.8  0.8   Alkaline Phos 38 - 126 U/L 72  56  63   AST 15 - 41 U/L 51  33  95   ALT 0 - 44 U/L 54  48  86      No results found for: "CEA1", "CEA" / No results found for: "CEA1", "CEA" Lab Results  Component Value Date   PSA1 4.7 (H) 04/12/2022   No results found for: "UJW119" No results found for: "CAN125"  No results found for: "TOTALPROTELP", "ALBUMINELP", "A1GS", "A2GS", "BETS", "BETA2SER", "GAMS", "MSPIKE", "SPEI" No results found for: "TIBC", "FERRITIN", "IRONPCTSAT" No results found for: "LDH"   STUDIES:   No results found.

## 2023-03-21 NOTE — Progress Notes (Signed)
Patient presents today for Imfinzi infusion. Patient is in satisfactory condition with no new complaints voiced.  Vital signs are stable.  Labs reviewed by Dr. Ellin Saba during the office visit and all labs are within treatment parameters.  We will proceed with treatment per MD orders.   Treatment given today per MD orders. Tolerated infusion without adverse affects. Vital signs stable. No complaints at this time. Discharged from clinic ambulatory in stable condition. Alert and oriented x 3. F/U with [facility] as scheduled.

## 2023-03-22 ENCOUNTER — Other Ambulatory Visit: Payer: Self-pay

## 2023-03-22 LAB — T4: T4, Total: 6.6 ug/dL (ref 4.5–12.0)

## 2023-03-27 ENCOUNTER — Other Ambulatory Visit: Payer: Self-pay | Admitting: *Deleted

## 2023-03-27 DIAGNOSIS — N401 Enlarged prostate with lower urinary tract symptoms: Secondary | ICD-10-CM

## 2023-03-27 MED ORDER — TAMSULOSIN HCL 0.4 MG PO CAPS: 0.4000 mg | ORAL_CAPSULE | Freq: Every day | ORAL | 3 refills | Status: DC

## 2023-03-29 ENCOUNTER — Other Ambulatory Visit: Payer: Self-pay

## 2023-03-29 DIAGNOSIS — N138 Other obstructive and reflux uropathy: Secondary | ICD-10-CM

## 2023-03-29 MED ORDER — TAMSULOSIN HCL 0.4 MG PO CAPS: 0.4000 mg | ORAL_CAPSULE | Freq: Every day | ORAL | 3 refills | Status: AC

## 2023-04-03 ENCOUNTER — Other Ambulatory Visit: Payer: Self-pay

## 2023-04-11 ENCOUNTER — Encounter: Payer: Self-pay | Admitting: Hematology

## 2023-04-15 ENCOUNTER — Other Ambulatory Visit: Payer: Self-pay | Admitting: *Deleted

## 2023-04-15 MED ORDER — MELOXICAM 15 MG PO TABS: 15.0000 mg | ORAL_TABLET | Freq: Every morning | ORAL | 2 refills | Status: DC

## 2023-04-18 ENCOUNTER — Inpatient Hospital Stay

## 2023-04-18 ENCOUNTER — Inpatient Hospital Stay: Attending: Hematology

## 2023-04-18 ENCOUNTER — Inpatient Hospital Stay: Payer: Medicare Other | Admitting: Hematology

## 2023-04-18 ENCOUNTER — Inpatient Hospital Stay: Payer: Medicare Other

## 2023-04-18 VITALS — BP 131/78 | HR 96 | Temp 98.0°F | Resp 18

## 2023-04-18 DIAGNOSIS — Z5112 Encounter for antineoplastic immunotherapy: Secondary | ICD-10-CM | POA: Insufficient documentation

## 2023-04-18 DIAGNOSIS — C3411 Malignant neoplasm of upper lobe, right bronchus or lung: Secondary | ICD-10-CM | POA: Insufficient documentation

## 2023-04-18 DIAGNOSIS — Z95828 Presence of other vascular implants and grafts: Secondary | ICD-10-CM

## 2023-04-18 DIAGNOSIS — Z79899 Other long term (current) drug therapy: Secondary | ICD-10-CM

## 2023-04-18 DIAGNOSIS — C3491 Malignant neoplasm of unspecified part of right bronchus or lung: Secondary | ICD-10-CM

## 2023-04-18 LAB — COMPREHENSIVE METABOLIC PANEL WITH GFR
ALT: 27 U/L (ref 0–44)
AST: 21 U/L (ref 15–41)
Albumin: 3.9 g/dL (ref 3.5–5.0)
Alkaline Phosphatase: 64 U/L (ref 38–126)
Anion gap: 10 (ref 5–15)
BUN: 8 mg/dL (ref 8–23)
CO2: 20 mmol/L — ABNORMAL LOW (ref 22–32)
Calcium: 8.6 mg/dL — ABNORMAL LOW (ref 8.9–10.3)
Chloride: 100 mmol/L (ref 98–111)
Creatinine, Ser: 0.83 mg/dL (ref 0.61–1.24)
GFR, Estimated: >60 mL/min (ref >60)
Glucose, Bld: 172 mg/dL — ABNORMAL HIGH (ref 70–99)
Potassium: 3.6 mmol/L (ref 3.5–5.1)
Sodium: 130 mmol/L — ABNORMAL LOW (ref 135–145)
Total Bilirubin: 0.7 mg/dL (ref 0.0–1.2)
Total Protein: 6.9 g/dL (ref 6.5–8.1)

## 2023-04-18 LAB — CBC WITH DIFFERENTIAL/PLATELET
Abs Immature Granulocytes: 0.05 10*3/uL (ref 0.00–0.07)
Basophils Absolute: 0.1 10*3/uL (ref 0.0–0.1)
Basophils Relative: 1 %
Eosinophils Absolute: 0.3 10*3/uL (ref 0.0–0.5)
Eosinophils Relative: 3 %
HCT: 41.4 % (ref 39.0–52.0)
Hemoglobin: 14.4 g/dL (ref 13.0–17.0)
Immature Granulocytes: 1 %
Lymphocytes Relative: 6 %
Lymphs Abs: 0.6 10*3/uL — ABNORMAL LOW (ref 0.7–4.0)
MCH: 32.3 pg (ref 26.0–34.0)
MCHC: 34.8 g/dL (ref 30.0–36.0)
MCV: 92.8 fL (ref 80.0–100.0)
Monocytes Absolute: 0.6 10*3/uL (ref 0.1–1.0)
Monocytes Relative: 7 %
Neutro Abs: 7.5 10*3/uL (ref 1.7–7.7)
Neutrophils Relative %: 82 %
Platelets: 304 10*3/uL (ref 150–400)
RBC: 4.46 MIL/uL (ref 4.22–5.81)
RDW: 12.9 % (ref 11.5–15.5)
WBC: 9.1 10*3/uL (ref 4.0–10.5)
nRBC: 0 % (ref 0.0–0.2)

## 2023-04-18 LAB — TSH: TSH: 1.365 u[IU]/mL (ref 0.350–4.500)

## 2023-04-18 MED ORDER — SODIUM CHLORIDE 0.9 % IV SOLN: Freq: Once | INTRAVENOUS | Status: AC

## 2023-04-18 MED ORDER — SODIUM CHLORIDE 0.9% FLUSH
10.0000 mL | INTRAVENOUS | Status: DC | PRN
  Administered 2023-04-18: 10 mL

## 2023-04-18 MED ORDER — SODIUM CHLORIDE 0.9 % IV SOLN
1500.0000 mg | Freq: Once | INTRAVENOUS | Status: AC
  Administered 2023-04-18: 1500 mg via INTRAVENOUS
  Filled 2023-04-18: qty 30

## 2023-04-18 MED ORDER — SODIUM CHLORIDE 0.9% FLUSH
10.0000 mL | Freq: Once | INTRAVENOUS | Status: AC
  Administered 2023-04-18: 10 mL via INTRAVENOUS

## 2023-04-18 MED ORDER — HEPARIN SOD (PORK) LOCK FLUSH 100 UNIT/ML IV SOLN
500.0000 [IU] | Freq: Once | INTRAVENOUS | Status: AC | PRN
Start: 2023-04-18 — End: 2023-04-18
  Administered 2023-04-18: 500 [IU]

## 2023-04-18 NOTE — Progress Notes (Signed)
 Patient presents today for chemotherapy infusion.  Patient c/o R sided chest pain x 2 weeks.  Tender to touch.  No visible swelling/discoloration.  Dr. Ellin Saba made aware.  We will draw additional labs per MD.  Vital signs are stable.  Labs reviewed and all labs are within treatment parameters.  We will proceed with treatment per MD orders.    Patient tolerated treatment well with no complaints voiced.  Patient left ambulatory in stable condition.  Vital signs stable at discharge.  Follow up as scheduled.

## 2023-04-18 NOTE — Patient Instructions (Signed)
 [facility] - A DEPT OF [facility]  Discharge Instructions: Thank you for choosing [facility] to provide your oncology and hematology care.  If you have a lab appointment with the Cancer Center - please note that after April 8th, 2024, all labs will be drawn in the cancer center.  You do not have to check in or register with the main entrance as you have in the past but will complete your check-in in the cancer center.  Wear comfortable clothing and clothing appropriate for easy access to any Portacath or PICC line.   We strive to give you quality time with your provider. You may need to reschedule your appointment if you arrive late (15 or more minutes).  Arriving late affects you and other patients whose appointments are after yours.  Also, if you miss three or more appointments without notifying the office, you may be dismissed from the clinic at the provider's discretion.      For prescription refill requests, have your pharmacy contact our office and allow 72 hours for refills to be completed.    Today you received the following chemotherapy and/or immunotherapy agents Imfinzi.  Durvalumab Injection What is this medication? DURVALUMAB (dur VAL ue mab) treats some types of cancer. It works by helping your immune system slow or stop the spread of cancer cells. It is a monoclonal antibody. This medicine may be used for other purposes; ask your health care provider or pharmacist if you have questions. COMMON BRAND NAME(S): IMFINZI What should I tell my care team before I take this medication? They need to know if you have any of these conditions: Allogeneic stem cell transplant (uses someone else's stem cells) Autoimmune diseases, such as Crohn disease, ulcerative colitis, lupus History of chest radiation Nervous system problems, such as Guillain-Barre syndrome, myasthenia gravis Organ transplant An unusual or allergic reaction to durvalumab,  other medications, foods, dyes, or preservatives Pregnant or trying to get pregnant Breast-feeding How should I use this medication? This medication is infused into a vein. It is given by your care team in a hospital or clinic setting. A special MedGuide will be given to you before each treatment. Be sure to read this information carefully each time. Talk to your care team about the use of this medication in children. Special care may be needed. Overdosage: If you think you have taken too much of this medicine contact a poison control center or emergency room at once. NOTE: This medicine is only for you. Do not share this medicine with others. What if I miss a dose? Keep appointments for follow-up doses. It is important not to miss your dose. Call your care team if you are unable to keep an appointment. What may interact with this medication? Interactions have not been studied. This list may not describe all possible interactions. Give your health care provider a list of all the medicines, herbs, non-prescription drugs, or dietary supplements you use. Also tell them if you smoke, drink alcohol, or use illegal drugs. Some items may interact with your medicine. What should I watch for while using this medication? Your condition will be monitored carefully while you are receiving this medication. You may need blood work while taking this medication. This medication may cause serious skin reactions. They can happen weeks to months after starting the medication. Contact your care team right away if you notice fevers or flu-like symptoms with a rash. The rash may be red or  purple and then turn into blisters or peeling of the skin. You may also notice a red rash with swelling of the face, lips, or lymph nodes in your neck or under your arms. Tell your care team right away if you have any change in your eyesight. Talk to your care team if you may be pregnant. Serious birth defects can occur if you take  this medication during pregnancy and for 3 months after the last dose. You will need a negative pregnancy test before starting this medication. Contraception is recommended while taking this medication and for 3 months after the last dose. Your care team can help you find the option that works for you. Do not breastfeed while taking this medication and for 3 months after the last dose. What side effects may I notice from receiving this medication? Side effects that you should report to your care team as soon as possible: Allergic reactions--skin rash, itching, hives, swelling of the face, lips, tongue, or throat Dry cough, shortness of breath or trouble breathing Eye pain, redness, irritation, or discharge with blurry or decreased vision Heart muscle inflammation--unusual weakness or fatigue, shortness of breath, chest pain, fast or irregular heartbeat, dizziness, swelling of the ankles, feet, or hands Hormone gland problems--headache, sensitivity to light, unusual weakness or fatigue, dizziness, fast or irregular heartbeat, increased sensitivity to cold or heat, excessive sweating, constipation, hair loss, increased thirst or amount of urine, tremors or shaking, irritability Infusion reactions--chest pain, shortness of breath or trouble breathing, feeling faint or lightheaded Kidney injury (glomerulonephritis)--decrease in the amount of urine, red or dark brown urine, foamy or bubbly urine, swelling of the ankles, hands, or feet Liver injury--right upper belly pain, loss of appetite, nausea, light-colored stool, dark yellow or brown urine, yellowing skin or eyes, unusual weakness or fatigue Pain, tingling, or numbness in the hands or feet, muscle weakness, change in vision, confusion or trouble speaking, loss of balance or coordination, trouble walking, seizures Rash, fever, and swollen lymph nodes Redness, blistering, peeling, or loosening of the skin, including inside the mouth Sudden or severe  stomach pain, bloody diarrhea, fever, nausea, vomiting Side effects that usually do not require medical attention (report these to your care team if they continue or are bothersome): Bone, joint, or muscle pain Diarrhea Fatigue Loss of appetite Nausea Skin rash This list may not describe all possible side effects. Call your doctor for medical advice about side effects. You may report side effects to FDA at 1-800-FDA-1088. Where should I keep my medication? This medication is given in a hospital or clinic. It will not be stored at home. NOTE: This sheet is a summary. It may not cover all possible information. If you have questions about this medicine, talk to your doctor, pharmacist, or health care provider.  2024 Elsevier/Gold Standard (2021-05-09 00:00:00)        To help prevent nausea and vomiting after your treatment, we encourage you to take your nausea medication as directed.  BELOW ARE SYMPTOMS THAT SHOULD BE REPORTED IMMEDIATELY: *FEVER GREATER THAN 100.4 F (38 C) OR HIGHER *CHILLS OR SWEATING *NAUSEA AND VOMITING THAT IS NOT CONTROLLED WITH YOUR NAUSEA MEDICATION *UNUSUAL SHORTNESS OF BREATH *UNUSUAL BRUISING OR BLEEDING *URINARY PROBLEMS (pain or burning when urinating, or frequent urination) *BOWEL PROBLEMS (unusual diarrhea, constipation, pain near the anus) TENDERNESS IN MOUTH AND THROAT WITH OR WITHOUT PRESENCE OF ULCERS (sore throat, sores in mouth, or a toothache) UNUSUAL RASH, SWELLING OR PAIN  UNUSUAL VAGINAL DISCHARGE OR ITCHING   Items  with * indicate a potential emergency and should be followed up as soon as possible or go to the Emergency Department if any problems should occur.  Please show the CHEMOTHERAPY ALERT CARD or IMMUNOTHERAPY ALERT CARD at check-in to the Emergency Department and triage nurse.  Should you have questions after your visit or need to cancel or reschedule your appointment, please contact [facility] [place] - A DEPT OF [facility] [phone]  and follow the prompts.  Office hours are 8:00 a.m. to 4:30 p.m. Monday - Friday. Please note that voicemails left after 4:00 p.m. may not be returned until the following business day.  We are closed weekends and major holidays. You have access to a nurse at all times for urgent questions. Please call the main number to the clinic [phone] and follow the prompts.  For any non-urgent questions, you may also contact your provider using MyChart. We now offer e-Visits for anyone 68 and older to request care online for non-urgent symptoms. For details visit [email or web address].   Also download the MyChart app! Go to the app store, search "MyChart", open the app, select [place], and log in with your MyChart username and password.

## 2023-04-19 LAB — LUTEINIZING HORMONE: LH: 9.2 m[IU]/mL — ABNORMAL HIGH (ref 1.7–8.6)

## 2023-04-19 LAB — TESTOSTERONE: Testosterone: 436 ng/dL (ref 264–916)

## 2023-04-19 LAB — T4: T4, Total: 9.3 ug/dL (ref 4.5–12.0)

## 2023-04-19 LAB — FOLLICLE STIMULATING HORMONE: FSH: 34.6 m[IU]/mL — ABNORMAL HIGH (ref 1.5–12.4)

## 2023-05-03 ENCOUNTER — Ambulatory Visit (HOSPITAL_COMMUNITY)
Admission: RE | Admit: 2023-05-03 | Discharge: 2023-05-03 | Disposition: A | Discharge status: Home / Self Care | Source: Ambulatory Visit | Attending: Hematology | Admitting: Hematology

## 2023-05-03 DIAGNOSIS — C3491 Malignant neoplasm of unspecified part of right bronchus or lung: Secondary | ICD-10-CM | POA: Insufficient documentation

## 2023-05-03 DIAGNOSIS — C3490 Malignant neoplasm of unspecified part of unspecified bronchus or lung: Secondary | ICD-10-CM | POA: Diagnosis not present

## 2023-05-03 DIAGNOSIS — I700 Atherosclerosis of aorta: Secondary | ICD-10-CM | POA: Diagnosis not present

## 2023-05-03 DIAGNOSIS — J432 Centrilobular emphysema: Secondary | ICD-10-CM | POA: Diagnosis not present

## 2023-05-03 MED ORDER — IOHEXOL 300 MG/ML  SOLN
80.0000 mL | Freq: Once | INTRAMUSCULAR | Status: AC | PRN
  Administered 2023-05-03: 80 mL via INTRAVENOUS

## 2023-05-13 ENCOUNTER — Encounter: Payer: Self-pay | Admitting: Hematology

## 2023-05-15 NOTE — Progress Notes (Signed)
 [name] [address] [place], [place] [address]    Clinic Day:  05/16/23   Referring physician: Omie Bickers, MD  Patient Care Team: [place] Bickers, MD as PCP - General (Internal Medicine) [place] Boros, MD as Medical Oncologist (Medical Oncology) [place] Knuckles, RN as Oncology Nurse Navigator (Medical Oncology)   ASSESSMENT & PLAN:   Assessment: 1.  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  No hemoptysis/weight loss. - Chest x-ray (04/10/2022): Mid right lung mass. - CT chest (04/13/2022): 9.2 x 5.7 x 6.6 cm right upper lobe mass with lobulations appearing to extend across the minor fissure into the right middle lobe and right lower lobe.  Satellite lesion in the right upper lobe measures 1.6 x 1.4 x 1.4 cm.  No findings of chest wall invasion.  Borderline prominent lower right paratracheal lymph node 0.9 cm.  Old granulomatous disease.  Small left adrenal adenoma. - Has right posterior chest wall pain for the past 6 months, dull type.  He is not requiring any pain medication. - Brain MRI (04/19/2022): No metastatic disease. - PET scan (04/19/2022): 9 x 5.7 cm right upper lobe mass with SUV 11.8.  1.3 x 0.9 cm satellite nodule in the right upper lobe with SUV 3.8.  No adenopathy or distant metastatic disease. - Bronchoscopy and biopsy by Dr. Baldwin Levee - Pathology (04/23/2022): Both right upper lobe lung nodule FNA consistent with poorly differentiated carcinoma.  IHC diffusely positive for TTF-1, Napsin, synaptophysin and CK7.  CD56 negative.  P63 and CK5/6 negative.  Given Napsin positivity, favor adenocarcinoma.  There is also diffuse synaptophysin positivity consistent with neuroendocrine differentiation.  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 - Discussed with Dr. Luna Salinas.  Not a surgical candidate as he requires pneumonectomy and has  suboptimal DLCO. - Chemoradiation with carboplatin  and paclitaxel  from 05/22/2022 through 07/06/2022 - Consolidation durvalumab  started on 07/27/2022   2.  Social/family history: - He is seen with his 2 sisters today.  Lives by himself and is independent of ADLs and IADLs.  He retired after working at [name] for the last 34 years.  No asbestos exposure.  No other chemicals.  Smokes more than 1 pack/day for the last 50 years (started at age 66) quit smoking for 5 years before starting back again. - Brother died of lung cancer.  Father had bladder cancer.  Maternal uncle had cancer.    Plan: 1.  Stage IIIa adenocarcinoma of the right lung: - He is tolerating durvalumab  without any significant side effects.  He reported nipple tenderness at his last treatment a month ago.  We have done testosterone , FSH and LH levels.  Testosterone  is 436, FSH and LH are mildly elevated at 34.6 and 9.2. - On examination no mass palpable.  Right nipple is tender to palpation. - CT chest on 05/03/2023: Similar size of the right upper lobe lung mass with no thoracic adenopathy.  Tiny bilateral lung nodules many of whom are very similar.  There are some nodules which are new measuring 3 mm.  Left adrenal nodule, without uptake on PET scan is stable. - Labs today: Normal LFTs and creatinine.  CBC grossly normal.  Last TSH is 1.3. - Proceed with durvalumab  today and monthly.  He has 2 more doses left. - Recommend follow-up in 3 months with repeat CT chest with contrast.  2.  Anxiety/depression: - Continue clonazepam  1 mg twice daily which is helping.   3.  COPD: - Continue Tessalon  Perles as needed.  They are helping with cough.    Orders Placed This Encounter  Procedures   CT CHEST W CONTRAST    Standing Status:   Future    Expected Date:   08/16/2023    Expiration Date:   05/15/2024    If indicated for the ordered procedure, I authorize the administration of contrast media per Radiology protocol:   Yes     Does the patient have a contrast media/X-ray dye allergy?:   No    Preferred imaging location?:   [name]   Magnesium     Standing Status:   Future    Expected Date:   06/13/2023    Expiration Date:   06/12/2024   CBC with Differential    Standing Status:   Future    Expected Date:   06/13/2023    Expiration Date:   06/12/2024   Comprehensive metabolic panel    Standing Status:   Future    Expected Date:   06/13/2023    Expiration Date:   06/12/2024   T4    Standing Status:   Future    Expected Date:   06/13/2023    Expiration Date:   06/12/2024   TSH    Standing Status:   Future    Expected Date:   06/13/2023    Expiration Date:   06/12/2024   Magnesium     Standing Status:   Future    Expected Date:   07/11/2023    Expiration Date:   07/10/2024   CBC with Differential    Standing Status:   Future    Expected Date:   07/11/2023    Expiration Date:   07/10/2024   Comprehensive metabolic panel    Standing Status:   Future    Expected Date:   07/11/2023    Expiration Date:   07/10/2024   T4    Standing Status:   Future    Expected Date:   07/11/2023    Expiration Date:   07/10/2024   TSH    Standing Status:   Future    Expected Date:   07/11/2023    Expiration Date:   07/10/2024      [name],acting as a scribe for [place] Boros, MD.,have documented all relevant documentation on the behalf of [place] Boros, MD,as directed by  [place] Boros, MD while in the presence of [place] Boros, MD.  I, [place] Boros MD, have reviewed the above documentation for accuracy and completeness, and I agree with the above.      [place] Boros, MD   5/8/20251:02 PM  CHIEF COMPLAINT:   Diagnosis: RUL lung adenocarcinoma    Cancer Staging  Non-small cell cancer of right lung [name]) Staging form: Lung, AJCC 8th Edition - Clinical stage from 05/01/2022: Stage IIIA (cT4, cN0, cM0) - Unsigned    Prior Therapy: none  Current Therapy:  concurrent chemoradiation  with carboplatin  + paclitaxel     HISTORY OF PRESENT ILLNESS:   Oncology History  Non-small cell cancer of right lung (HCC)  05/01/2022 Initial Diagnosis   Non-small cell cancer of right lung (HCC)   05/22/2022 - 07/05/2022 Chemotherapy   Patient is on Treatment Plan : LUNG Carboplatin  + Paclitaxel  + XRT q7d     07/27/2022 -  Chemotherapy   Patient is on Treatment Plan : LUNG Durvalumab  q 28 day (beginning  11/22/22)        INTERVAL HISTORY:   [name] is a 68 y.o. male presenting to clinic today for follow up of RUL lung adenocarcinoma. He was last seen by me on 03/21/23.  Since his last visit, he underwent CT chest on 05/03/23.   Today, he states that he is doing well overall. His appetite level is at 100%. His energy level is at 7 5%.   PAST MEDICAL HISTORY:   Past Medical History: Past Medical History:  Diagnosis Date   Anxiety    Cancer (HCC)    Skin cancer left shoulder   Chronic low back pain    Complication of anesthesia    Dermatofibrosarcoma protubera of right shoulder    Dyspnea    GERD (gastroesophageal reflux disease)    Hyperlipidemia    Lung cancer (HCC) 04/23/2022   PONV (postoperative nausea and vomiting)    Port-A-Cath in place 05/17/2022   Prostate cancer [name])     Surgical History: Past Surgical History:  Procedure Laterality Date   APPENDECTOMY     APPLICATION OF A-CELL OF EXTREMITY Right 07/09/2012   Procedure: PLACEMENT OF A-CELL TO UPPER EXTREMITY/PLACEMENT OF VAC;  Surgeon: Marilou Showman, DO;  Location: [name];  Service: Plastics;  Laterality: Right;   BRONCHIAL BRUSHINGS  04/23/2022   Procedure: BRONCHIAL BRUSHINGS;  Surgeon: Denson Flake, MD;  Location: [name];  Service: Pulmonary;;   BRONCHIAL NEEDLE ASPIRATION BIOPSY  04/23/2022   Procedure: BRONCHIAL NEEDLE ASPIRATION BIOPSIES;  Surgeon: Denson Flake, MD;  Location: [name];  Service: Pulmonary;;   COLONOSCOPY WITH PROPOFOL  N/A 09/30/2015   Procedure:  COLONOSCOPY WITH PROPOFOL ;  Surgeon: Ruby Corporal, MD;  Location: [facility];  Service: Endoscopy;  Laterality: N/A;  830   EXCISION DERMATOFIBROCARCOMA PROTUBERAN RIGHT SHOULDER  2 - 3 WKS AGO   HEMOSTASIS CONTROL  04/23/2022   Procedure: HEMOSTASIS CONTROL;  Surgeon: Denson Flake, MD;  Location: [facility] ENDOSCOPY;  Service: Pulmonary;;   INGUINAL HERNIA REPAIR Left 1999   IR IMAGING GUIDED PORT INSERTION  04/30/2022   KNEE ARTHROSCOPY Left 2012   POLYPECTOMY  09/30/2015   Procedure: POLYPECTOMY;  Surgeon: Ruby Corporal, MD;  Location: [facility];  Service: Endoscopy;;  sigmoid   VIDEO BRONCHOSCOPY WITH ENDOBRONCHIAL ULTRASOUND Bilateral 04/23/2022   Procedure: VIDEO BRONCHOSCOPY WITH ENDOBRONCHIAL ULTRASOUND;  Surgeon: Denson Flake, MD;  Location: [facility] ENDOSCOPY;  Service: Pulmonary;  Laterality: Bilateral;   VIDEO BRONCHOSCOPY WITH RADIAL ENDOBRONCHIAL ULTRASOUND  04/23/2022   Procedure: VIDEO BRONCHOSCOPY WITH RADIAL ENDOBRONCHIAL ULTRASOUND;  Surgeon: Denson Flake, MD;  Location: [facility] ENDOSCOPY;  Service: Pulmonary;;    Social History: Social History   Socioeconomic History   Marital status: Divorced    Spouse name: Wallene Gum   Number of children: 1   Years of education: 10th   Highest education level: Not on file  Occupational History    Employer: [employer]    Comment: [employer]  Tobacco Use   Smoking status: Every Day    Current packs/day: 1.00    Average packs/day: 1 pack/day for 40.0 years (40.0 ttl pk-yrs)    Types: Cigarettes   Smokeless tobacco: Never  Vaping Use   Vaping status: Never Used  Substance and Sexual Activity   Alcohol use: Yes    Comment: 2-3 cans beer/day   Drug use: No   Sexual activity: Not on file  Other Topics Concern   Not on file  Social History Narrative  Patient lives at home with his dog. 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 Patient has 10 grade education.Patient is  right-handed.Caffeine Use: 1 cup of coffee; 3-4 sodas daily   Social Drivers of Health   Financial Resource Strain: High Risk (05/10/2022)   Overall Financial Resource Strain (CARDIA)    Difficulty of Paying Living Expenses: Very hard  Food Insecurity: Food Insecurity Present (05/04/2022)   Hunger Vital Sign    Worried About Running Out of Food in the Last Year: Sometimes true    Ran Out of Food in the Last Year: Sometimes true  Transportation Needs: No Transportation Needs (05/04/2022)   PRAPARE - Administrator, Civil Service (Medical): No    Lack of Transportation (Non-Medical): No  Physical Activity: Not on file  Stress: Not on file  Social Connections: Not on file  Intimate Partner Violence: Not At Risk (05/04/2022)   Humiliation, Afraid, Rape, and Kick questionnaire    Fear of Current or Ex-Partner: No    Emotionally Abused: No    Physically Abused: No    Sexually Abused: No    Family History: Family History  Problem Relation Age of Onset   Dementia Mother    Cancer Father     Current Medications:  Current Outpatient Medications:    acetaminophen  (TYLENOL ) 500 MG tablet, Take 1,000 mg by mouth every 6 (six) hours as needed (pain.)., Disp: , Rfl:    albuterol  (VENTOLIN  HFA) 108 (90 Base) MCG/ACT inhaler, Inhale 2 puffs into the lungs every 6 (six) hours as needed for wheezing or shortness of breath., Disp: 8 g, Rfl: 6   aspirin EC 81 MG tablet, Take 81 mg by mouth at bedtime. Swallow whole., Disp: , Rfl:    benzonatate  (TESSALON ) 200 MG capsule, Take 1 capsule by mouth three times daily as needed for cough, Disp: 120 capsule, Rfl: 0   clonazePAM  (KLONOPIN ) 1 MG tablet, Tate 1 tablet in am and 2 tablets at bedtime., Disp: 90 tablet, Rfl: 0   Durvalumab  (IMFINZI  IV), Inject into the vein every 14 (fourteen) days., Disp: , Rfl:    lidocaine -prilocaine  (EMLA ) cream, Apply a quarter-sized amount to port a cath site and cover with plastic wrap 1 hour prior to infusion  appointments, Disp: 30 g, Rfl: 3   loperamide  (IMODIUM ) 2 MG capsule, Take 1 capsule (2 mg total) by mouth as needed for diarrhea or loose stools (Take 2 capsules after the first loose stool and then 1 capsule after each loose stool bt do not exceed more than 8 capsules in a 24 hr period. If it is bedtime take 2 capsules at bedtime and then take 2 capsules every 4 hours until morning)., Disp: 30 capsule, Rfl: 1   meloxicam  (MOBIC ) 15 MG tablet, Take 1 tablet (15 mg total) by mouth in the morning., Disp: 30 tablet, Rfl: 2   pantoprazole (PROTONIX) 40 MG tablet, Take 40 mg by mouth daily., Disp: , Rfl:    prochlorperazine  (COMPAZINE ) 10 MG tablet, Take 10 mg by mouth every 6 (six) hours as needed., Disp: , Rfl:    simvastatin  (ZOCOR ) 20 MG tablet, Take 1 tablet (20 mg total) by mouth at bedtime., Disp: 90 tablet, Rfl: 3   sucralfate  (CARAFATE ) 1 g tablet, Take 1 tablet (1 g total) by mouth 4 (four) times daily. Dissolve each tablet in 15 cc water before use., Disp: 120 tablet, Rfl: 2   tamsulosin  (FLOMAX ) 0.4 MG CAPS capsule, Take 1 capsule (0.4  mg total) by mouth at bedtime., Disp: 90 capsule, Rfl: 3 No current facility-administered medications for this visit.  Facility-Administered Medications Ordered in Other Visits:    sodium chloride  flush (NS) 0.9 % injection 10 mL, 10 mL, Intracatheter, PRN, Hollee Fate, MD, 10 mL at 05/16/23 1234   Allergies: Allergies  Allergen Reactions   Ciprofloxacin Swelling and Other (See Comments)    Swelling in hands, numbness in arms and joint pain   Gabapentin  Other (See Comments)    Double vision    REVIEW OF SYSTEMS:   Review of Systems  Constitutional:  Negative for chills, fatigue and fever.  HENT:   Negative for lump/mass, mouth sores, nosebleeds, sore throat and trouble swallowing.   Eyes:  Negative for eye problems.  Respiratory:  Positive for shortness of breath. Negative for cough.   Cardiovascular:  Positive for chest pain. Negative for  leg swelling and palpitations.  Gastrointestinal:  Negative for abdominal pain, constipation, diarrhea, nausea and vomiting.  Genitourinary:  Negative for bladder incontinence, difficulty urinating, dysuria, frequency, hematuria and nocturia.   Musculoskeletal:  Negative for arthralgias, back pain, flank pain, myalgias and neck pain.  Skin:  Negative for itching and rash.  Neurological:  Positive for dizziness. Negative for headaches and numbness.  Hematological:  Bruises/bleeds easily.  Psychiatric/Behavioral:  Positive for sleep disturbance. Negative for depression and suicidal ideas. The patient is not nervous/anxious.   All other systems reviewed and are negative.    VITALS:   There were no vitals taken for this visit.  Wt Readings from Last 3 Encounters:  05/16/23 181 lb 6.4 oz (82.3 kg)  04/18/23 179 lb 14.4 oz (81.6 kg)  03/21/23 176 lb 5.9 oz (80 kg)    There is no height or weight on file to calculate BMI.  Performance status (ECOG): 1 - Symptomatic but completely ambulatory  PHYSICAL EXAM:   Physical Exam Vitals and nursing note reviewed. Exam conducted with a chaperone present.  Constitutional:      Appearance: Normal appearance.  Cardiovascular:     Rate and Rhythm: Normal rate and regular rhythm.     Pulses: Normal pulses.     Heart sounds: Normal heart sounds.  Pulmonary:     Effort: Pulmonary effort is normal.     Breath sounds: Normal breath sounds.  Abdominal:     Palpations: Abdomen is soft. There is no hepatomegaly, splenomegaly or mass.     Tenderness: There is no abdominal tenderness.  Musculoskeletal:     Right lower leg: No edema.     Left lower leg: No edema.  Lymphadenopathy:     Cervical: No cervical adenopathy.     Right cervical: No superficial, deep or posterior cervical adenopathy.    Left cervical: No superficial, deep or posterior cervical adenopathy.     Upper Body:     Right upper body: No supraclavicular or axillary adenopathy.      Left upper body: No supraclavicular or axillary adenopathy.  Neurological:     General: No focal deficit present.     Mental Status: He is alert and oriented to person, place, and time.  Psychiatric:        Mood and Affect: Mood normal.        Behavior: Behavior normal.     LABS:      Latest Ref Rng & Units 05/16/2023    9:13 AM 04/18/2023   10:59 AM 03/21/2023    9:58 AM  CBC  WBC 4.0 - 10.5 K/uL 8.9  9.1  8.2   Hemoglobin 13.0 - 17.0 g/dL 16.1  09.6  04.5   Hematocrit 39.0 - 52.0 % 42.7  41.4  42.4   Platelets 150 - 400 K/uL 334  304  257       Latest Ref Rng & Units 05/16/2023    9:13 AM 04/18/2023   10:59 AM 03/21/2023    9:58 AM  CMP  Glucose 70 - 99 mg/dL 409  811  914   BUN 8 - 23 mg/dL 9  8  8    Creatinine 0.61 - 1.24 mg/dL 7.82  9.56  2.13   Sodium 135 - 145 mmol/L 131  130  130   Potassium 3.5 - 5.1 mmol/L 3.7  3.6  4.0   Chloride 98 - 111 mmol/L 99  100  97   CO2 22 - 32 mmol/L 20  20  21    Calcium 8.9 - 10.3 mg/dL 9.2  8.6  9.0   Total Protein 6.5 - 8.1 g/dL 7.2  6.9  7.0   Total Bilirubin 0.0 - 1.2 mg/dL 0.7  0.7  1.1   Alkaline Phos 38 - 126 U/L 62  64  72   AST 15 - 41 U/L 18  21  51   ALT 0 - 44 U/L 18  27  54      No results found for: "CEA1", "CEA" / No results found for: "CEA1", "CEA" Lab Results  Component Value Date   PSA1 4.7 (H) 04/12/2022   No results found for: "YQM578" No results found for: "CAN125"  No results found for: "TOTALPROTELP", "ALBUMINELP", "A1GS", "A2GS", "BETS", "BETA2SER", "GAMS", "MSPIKE", "SPEI" No results found for: "TIBC", "FERRITIN", "IRONPCTSAT" No results found for: "LDH"   STUDIES:   CT CHEST W CONTRAST Result Date: 05/16/2023 CLINICAL DATA:  Non-small-cell lung cancer. Restaging. * Tracking Code: BO * EXAM: CT CHEST WITH CONTRAST TECHNIQUE: Multidetector CT imaging of the chest was performed during intravenous contrast administration. RADIATION DOSE REDUCTION: This exam was performed according to the departmental  dose-optimization program which includes automated exposure control, adjustment of the mA and/or kV according to patient size and/or use of iterative reconstruction technique. CONTRAST:  80mL OMNIPAQUE  IOHEXOL  300 MG/ML  SOLN COMPARISON:  01/10/2023 FINDINGS: Cardiovascular: Left Port-A-Cath tip high right atrium. Aortic atherosclerosis. Tortuous thoracic aorta. Normal heart size with minimal anterior pericardial fluid. No central pulmonary embolism, on this non-dedicated study. Mediastinum/Nodes: No supraclavicular adenopathy. No mediastinal or hilar adenopathy. Lungs/Pleura: No pleural fluid.  Moderate centrilobular emphysema. Right upper lobe pulmonary nodule measures 1.7 x 1.3 cm on 49/3 and is similar to the prior exam (when remeasured). Inferior right upper lobe/perihilar lung mass measures 4.5 x 3.1 cm on 67/3 versus 4.9 by 2.9 cm on the prior exam (when remeasured). This suggests stability. Surrounding interstitial thickening is likely radiation induced, increased. Scattered calcified granulomas. Millimetric bilateral pulmonary nodules, many of which are similar. Nodules along the right minor fissure are new including at 3-4 mm on 66/3 and 69/3. A left upper lobe 3 mm nodule on 40/3 is new. Upper Abdomen: Old granulomatous disease in the liver. Normal imaged portions of the spleen, stomach, pancreas, gallbladder, right adrenal gland, kidneys. Lateral limb left adrenal 1.5 cm nodule is similar to on the prior and was present back to at least 04/19/2022 PET. Not significantly hypermetabolic on that exam. Musculoskeletal: No acute osseous abnormality. IMPRESSION: 1. Similar size of right upper lobe lung mass and dominant pulmonary nodule. 2. No thoracic adenopathy. 3. Tiny bilateral  pulmonary nodules, many of which are similar. There are some nodules which are new, for which pulmonary metastasis cannot be excluded. Recommend attention on follow-up. 4. Left adrenal nodule is stable, was not tracer avid on prior  PET, is favored to represent adenoma. 5. Aortic atherosclerosis (ICD10-I70.0), coronary artery atherosclerosis and emphysema (ICD10-J43.9). Electronically Signed   By: Lore Rode M.D.   On: 05/16/2023 10:01

## 2023-05-16 ENCOUNTER — Inpatient Hospital Stay: Attending: Hematology

## 2023-05-16 ENCOUNTER — Encounter: Payer: Self-pay | Admitting: Hematology

## 2023-05-16 ENCOUNTER — Inpatient Hospital Stay (HOSPITAL_BASED_OUTPATIENT_CLINIC_OR_DEPARTMENT_OTHER): Admitting: Hematology

## 2023-05-16 ENCOUNTER — Inpatient Hospital Stay

## 2023-05-16 VITALS — BP 131/77 | HR 87 | Temp 97.5°F | Resp 18

## 2023-05-16 DIAGNOSIS — C3491 Malignant neoplasm of unspecified part of right bronchus or lung: Secondary | ICD-10-CM

## 2023-05-16 DIAGNOSIS — Z95828 Presence of other vascular implants and grafts: Secondary | ICD-10-CM

## 2023-05-16 DIAGNOSIS — F32A Depression, unspecified: Secondary | ICD-10-CM | POA: Diagnosis not present

## 2023-05-16 DIAGNOSIS — F17210 Nicotine dependence, cigarettes, uncomplicated: Secondary | ICD-10-CM | POA: Diagnosis not present

## 2023-05-16 DIAGNOSIS — D3502 Benign neoplasm of left adrenal gland: Secondary | ICD-10-CM | POA: Diagnosis not present

## 2023-05-16 DIAGNOSIS — F419 Anxiety disorder, unspecified: Secondary | ICD-10-CM | POA: Insufficient documentation

## 2023-05-16 DIAGNOSIS — Z79899 Other long term (current) drug therapy: Secondary | ICD-10-CM | POA: Insufficient documentation

## 2023-05-16 DIAGNOSIS — Z5112 Encounter for antineoplastic immunotherapy: Secondary | ICD-10-CM | POA: Diagnosis not present

## 2023-05-16 DIAGNOSIS — C3411 Malignant neoplasm of upper lobe, right bronchus or lung: Secondary | ICD-10-CM | POA: Diagnosis not present

## 2023-05-16 DIAGNOSIS — J432 Centrilobular emphysema: Secondary | ICD-10-CM | POA: Diagnosis not present

## 2023-05-16 LAB — COMPREHENSIVE METABOLIC PANEL WITH GFR
ALT: 18 U/L (ref 0–44)
AST: 18 U/L (ref 15–41)
Albumin: 4.1 g/dL (ref 3.5–5.0)
Alkaline Phosphatase: 62 U/L (ref 38–126)
Anion gap: 12 (ref 5–15)
BUN: 9 mg/dL (ref 8–23)
CO2: 20 mmol/L — ABNORMAL LOW (ref 22–32)
Calcium: 9.2 mg/dL (ref 8.9–10.3)
Chloride: 99 mmol/L (ref 98–111)
Creatinine, Ser: 0.88 mg/dL (ref 0.61–1.24)
GFR, Estimated: >60 mL/min (ref >60)
Glucose, Bld: 151 mg/dL — ABNORMAL HIGH (ref 70–99)
Potassium: 3.7 mmol/L (ref 3.5–5.1)
Sodium: 131 mmol/L — ABNORMAL LOW (ref 135–145)
Total Bilirubin: 0.7 mg/dL (ref 0.0–1.2)
Total Protein: 7.2 g/dL (ref 6.5–8.1)

## 2023-05-16 LAB — CBC WITH DIFFERENTIAL/PLATELET
Abs Immature Granulocytes: 0.04 10*3/uL (ref 0.00–0.07)
Basophils Absolute: 0.1 10*3/uL (ref 0.0–0.1)
Basophils Relative: 1 %
Eosinophils Absolute: 0.3 10*3/uL (ref 0.0–0.5)
Eosinophils Relative: 3 %
HCT: 42.7 % (ref 39.0–52.0)
Hemoglobin: 15.2 g/dL (ref 13.0–17.0)
Immature Granulocytes: 1 %
Lymphocytes Relative: 6 %
Lymphs Abs: 0.6 10*3/uL — ABNORMAL LOW (ref 0.7–4.0)
MCH: 32.7 pg (ref 26.0–34.0)
MCHC: 35.6 g/dL (ref 30.0–36.0)
MCV: 91.8 fL (ref 80.0–100.0)
Monocytes Absolute: 0.7 10*3/uL (ref 0.1–1.0)
Monocytes Relative: 7 %
Neutro Abs: 7.2 10*3/uL (ref 1.7–7.7)
Neutrophils Relative %: 82 %
Platelets: 334 10*3/uL (ref 150–400)
RBC: 4.65 MIL/uL (ref 4.22–5.81)
RDW: 12.9 % (ref 11.5–15.5)
WBC: 8.9 10*3/uL (ref 4.0–10.5)
nRBC: 0 % (ref 0.0–0.2)

## 2023-05-16 LAB — MAGNESIUM: Magnesium: 1.9 mg/dL (ref 1.7–2.4)

## 2023-05-16 MED ORDER — SODIUM CHLORIDE 0.9% FLUSH
10.0000 mL | Freq: Once | INTRAVENOUS | Status: AC
  Administered 2023-05-16: 10 mL via INTRAVENOUS

## 2023-05-16 MED ORDER — SODIUM CHLORIDE 0.9 % IV SOLN
1500.0000 mg | Freq: Once | INTRAVENOUS | Status: AC
  Administered 2023-05-16: 1500 mg via INTRAVENOUS
  Filled 2023-05-16: qty 30

## 2023-05-16 MED ORDER — SODIUM CHLORIDE 0.9% FLUSH
10.0000 mL | INTRAVENOUS | Status: DC | PRN
Start: 2023-05-16 — End: 2023-05-16
  Administered 2023-05-16: 10 mL

## 2023-05-16 MED ORDER — HEPARIN SOD (PORK) LOCK FLUSH 100 UNIT/ML IV SOLN
500.0000 [IU] | Freq: Once | INTRAVENOUS | Status: AC | PRN
  Administered 2023-05-16: 500 [IU]

## 2023-05-16 MED ORDER — SODIUM CHLORIDE 0.9 % IV SOLN: Freq: Once | INTRAVENOUS | Status: AC

## 2023-05-16 NOTE — Progress Notes (Signed)
 Patient has been examined by Dr. Cheree Cords. Vital signs (HR 106) and labs have been reviewed by MD - ANC, Creatinine, LFTs, hemoglobin, and platelets are within treatment parameters per M.D. - pt may proceed with treatment.  Primary RN and pharmacy notified.

## 2023-05-16 NOTE — Patient Instructions (Signed)
 [facility] - A DEPT OF [facility]  Discharge Instructions: Thank you for choosing [facility] to provide your oncology and hematology care.  If you have a lab appointment with the Cancer Center - please note that after April 8th, 2024, all labs will be drawn in the cancer center.  You do not have to check in or register with the main entrance as you have in the past but will complete your check-in in the cancer center.  Wear comfortable clothing and clothing appropriate for easy access to any Portacath or PICC line.   We strive to give you quality time with your provider. You may need to reschedule your appointment if you arrive late (15 or more minutes).  Arriving late affects you and other patients whose appointments are after yours.  Also, if you miss three or more appointments without notifying the office, you may be dismissed from the clinic at the provider's discretion.      For prescription refill requests, have your pharmacy contact our office and allow 72 hours for refills to be completed.    Today you received the following chemotherapy and/or immunotherapy agents Imfinzi      To help prevent nausea and vomiting after your treatment, we encourage you to take your nausea medication as directed.  BELOW ARE SYMPTOMS THAT SHOULD BE REPORTED IMMEDIATELY: *FEVER GREATER THAN 100.4 F (38 C) OR HIGHER *CHILLS OR SWEATING *NAUSEA AND VOMITING THAT IS NOT CONTROLLED WITH YOUR NAUSEA MEDICATION *UNUSUAL SHORTNESS OF BREATH *UNUSUAL BRUISING OR BLEEDING *URINARY PROBLEMS (pain or burning when urinating, or frequent urination) *BOWEL PROBLEMS (unusual diarrhea, constipation, pain near the anus) TENDERNESS IN MOUTH AND THROAT WITH OR WITHOUT PRESENCE OF ULCERS (sore throat, sores in mouth, or a toothache) UNUSUAL RASH, SWELLING OR PAIN  UNUSUAL VAGINAL DISCHARGE OR ITCHING   Items with * indicate a potential emergency and should be followed up  as soon as possible or go to the Emergency Department if any problems should occur.  Please show the CHEMOTHERAPY ALERT CARD or IMMUNOTHERAPY ALERT CARD at check-in to the Emergency Department and triage nurse.  Should you have questions after your visit or need to cancel or reschedule your appointment, please contact [facility] [place] - A DEPT OF [facility] [phone]  and follow the prompts.  Office hours are 8:00 a.m. to 4:30 p.m. Monday - Friday. Please note that voicemails left after 4:00 p.m. may not be returned until the following business day.  We are closed weekends and major holidays. You have access to a nurse at all times for urgent questions. Please call the main number to the clinic [phone] and follow the prompts.  For any non-urgent questions, you may also contact your provider using MyChart. We now offer e-Visits for anyone 42 and older to request care online for non-urgent symptoms. For details visit [email or web address].   Also download the MyChart app! Go to the app store, search "MyChart", open the app, select [place], and log in with your MyChart username and password.

## 2023-05-16 NOTE — Patient Instructions (Signed)
[facility] at [facility] Discharge Instructions   You were seen and examined today by Dr. Katragadda.  He reviewed the results of your lab work which are normal/stable.   He reviewed the results of your CT scan which is stable.   We will proceed with your treatment today.   Return as scheduled.    Thank you for choosing [facility] at [facility] to provide your oncology and hematology care.  To afford each patient quality time with our provider, please arrive at least 15 minutes before your scheduled appointment time.   If you have a lab appointment with the Cancer Center please come in thru the Main Entrance and check in at the main information desk.  You need to re-schedule your appointment should you arrive 10 or more minutes late.  We strive to give you quality time with our providers, and arriving late affects you and other patients whose appointments are after yours.  Also, if you no show three or more times for appointments you may be dismissed from the clinic at the providers discretion.     Again, thank you for choosing [facility].  Our hope is that these requests will decrease the amount of time that you wait before being seen by our physicians.       _____________________________________________________________  Should you have questions after your visit to [facility], please contact our office at [phone] and follow the prompts.  Our office hours are 8:00 a.m. and 4:30 p.m. Monday - Friday.  Please note that voicemails left after 4:00 p.m. may not be returned until the following business day.  We are closed weekends and major holidays.  You do have access to a nurse 24-7, just call the main number to the clinic [phone] and do not press any options, hold on the line and a nurse will answer the phone.    For prescription refill requests, have your pharmacy contact our office and allow 72 hours.     Due to Covid, you will need to wear a mask upon entering the hospital. If you do not have a mask, a mask will be given to you at the Main Entrance upon arrival. For doctor visits, patients may have 1 support person age 18 or older with them. For treatment visits, patients can not have anyone with them due to social distancing guidelines and our immunocompromised population.      

## 2023-05-16 NOTE — Progress Notes (Signed)
Patient presents today for Imfinzi infusion per providers order.  Vital signs and labs reviewed by MD.  Message received from Allison Anderson RN/Dr. Katragadda patient okay for treatment.  Treatment given today per MD orders.  Stable during infusion without adverse affects.  Vital signs stable.  No complaints at this time.  Discharge from clinic ambulatory in stable condition.  Alert and oriented X 3.  Follow up with [facility] as scheduled.  

## 2023-05-17 ENCOUNTER — Other Ambulatory Visit: Payer: Self-pay

## 2023-05-29 ENCOUNTER — Other Ambulatory Visit: Payer: Self-pay

## 2023-06-10 DIAGNOSIS — J449 Chronic obstructive pulmonary disease, unspecified: Secondary | ICD-10-CM | POA: Diagnosis not present

## 2023-06-10 DIAGNOSIS — E782 Mixed hyperlipidemia: Secondary | ICD-10-CM | POA: Diagnosis not present

## 2023-06-10 DIAGNOSIS — C3491 Malignant neoplasm of unspecified part of right bronchus or lung: Secondary | ICD-10-CM | POA: Diagnosis not present

## 2023-06-11 ENCOUNTER — Other Ambulatory Visit: Payer: Self-pay | Admitting: Hematology

## 2023-06-12 ENCOUNTER — Encounter: Payer: Self-pay | Admitting: Hematology

## 2023-06-12 DIAGNOSIS — E782 Mixed hyperlipidemia: Secondary | ICD-10-CM | POA: Diagnosis not present

## 2023-06-12 DIAGNOSIS — K219 Gastro-esophageal reflux disease without esophagitis: Secondary | ICD-10-CM | POA: Diagnosis not present

## 2023-06-12 DIAGNOSIS — Z7189 Other specified counseling: Secondary | ICD-10-CM | POA: Diagnosis not present

## 2023-06-12 DIAGNOSIS — R42 Dizziness and giddiness: Secondary | ICD-10-CM | POA: Diagnosis not present

## 2023-06-12 DIAGNOSIS — J449 Chronic obstructive pulmonary disease, unspecified: Secondary | ICD-10-CM | POA: Diagnosis not present

## 2023-06-12 DIAGNOSIS — C3491 Malignant neoplasm of unspecified part of right bronchus or lung: Secondary | ICD-10-CM | POA: Diagnosis not present

## 2023-06-12 DIAGNOSIS — D3502 Benign neoplasm of left adrenal gland: Secondary | ICD-10-CM | POA: Diagnosis not present

## 2023-06-12 DIAGNOSIS — I700 Atherosclerosis of aorta: Secondary | ICD-10-CM | POA: Diagnosis not present

## 2023-06-12 DIAGNOSIS — R002 Palpitations: Secondary | ICD-10-CM | POA: Diagnosis not present

## 2023-06-12 DIAGNOSIS — G4700 Insomnia, unspecified: Secondary | ICD-10-CM | POA: Diagnosis not present

## 2023-06-12 DIAGNOSIS — M5416 Radiculopathy, lumbar region: Secondary | ICD-10-CM | POA: Diagnosis not present

## 2023-06-13 ENCOUNTER — Inpatient Hospital Stay

## 2023-06-13 ENCOUNTER — Inpatient Hospital Stay: Attending: Hematology

## 2023-06-13 VITALS — BP 156/76 | HR 83 | Temp 97.0°F | Resp 20

## 2023-06-13 DIAGNOSIS — Z5112 Encounter for antineoplastic immunotherapy: Secondary | ICD-10-CM | POA: Diagnosis not present

## 2023-06-13 DIAGNOSIS — Z79899 Other long term (current) drug therapy: Secondary | ICD-10-CM | POA: Insufficient documentation

## 2023-06-13 DIAGNOSIS — C3411 Malignant neoplasm of upper lobe, right bronchus or lung: Secondary | ICD-10-CM | POA: Insufficient documentation

## 2023-06-13 DIAGNOSIS — C3491 Malignant neoplasm of unspecified part of right bronchus or lung: Secondary | ICD-10-CM

## 2023-06-13 DIAGNOSIS — Z95828 Presence of other vascular implants and grafts: Secondary | ICD-10-CM

## 2023-06-13 LAB — COMPREHENSIVE METABOLIC PANEL WITH GFR
ALT: 20 U/L (ref 0–44)
AST: 24 U/L (ref 15–41)
Albumin: 3.8 g/dL (ref 3.5–5.0)
Alkaline Phosphatase: 60 U/L (ref 38–126)
Anion gap: 8 (ref 5–15)
BUN: 9 mg/dL (ref 8–23)
CO2: 22 mmol/L (ref 22–32)
Calcium: 8.9 mg/dL (ref 8.9–10.3)
Chloride: 103 mmol/L (ref 98–111)
Creatinine, Ser: 0.75 mg/dL (ref 0.61–1.24)
GFR, Estimated: >60 mL/min (ref >60)
Glucose, Bld: 88 mg/dL (ref 70–99)
Potassium: 3.9 mmol/L (ref 3.5–5.1)
Sodium: 133 mmol/L — ABNORMAL LOW (ref 135–145)
Total Bilirubin: 0.9 mg/dL (ref 0.0–1.2)
Total Protein: 6.8 g/dL (ref 6.5–8.1)

## 2023-06-13 LAB — CBC WITH DIFFERENTIAL/PLATELET
Abs Immature Granulocytes: 0.04 10*3/uL (ref 0.00–0.07)
Basophils Absolute: 0.1 10*3/uL (ref 0.0–0.1)
Basophils Relative: 1 %
Eosinophils Absolute: 0.1 10*3/uL (ref 0.0–0.5)
Eosinophils Relative: 1 %
HCT: 39.9 % (ref 39.0–52.0)
Hemoglobin: 13.9 g/dL (ref 13.0–17.0)
Immature Granulocytes: 1 %
Lymphocytes Relative: 6 %
Lymphs Abs: 0.4 10*3/uL — ABNORMAL LOW (ref 0.7–4.0)
MCH: 32 pg (ref 26.0–34.0)
MCHC: 34.8 g/dL (ref 30.0–36.0)
MCV: 91.9 fL (ref 80.0–100.0)
Monocytes Absolute: 1.1 10*3/uL — ABNORMAL HIGH (ref 0.1–1.0)
Monocytes Relative: 14 %
Neutro Abs: 5.9 10*3/uL (ref 1.7–7.7)
Neutrophils Relative %: 77 %
Platelets: 286 10*3/uL (ref 150–400)
RBC: 4.34 MIL/uL (ref 4.22–5.81)
RDW: 14.3 % (ref 11.5–15.5)
WBC: 7.6 10*3/uL (ref 4.0–10.5)
nRBC: 0 % (ref 0.0–0.2)

## 2023-06-13 LAB — MAGNESIUM: Magnesium: 1.7 mg/dL (ref 1.7–2.4)

## 2023-06-13 LAB — TSH: TSH: 1.212 u[IU]/mL (ref 0.350–4.500)

## 2023-06-13 MED ORDER — SODIUM CHLORIDE 0.9% FLUSH
10.0000 mL | INTRAVENOUS | Status: DC | PRN
  Administered 2023-06-13: 10 mL

## 2023-06-13 MED ORDER — SODIUM CHLORIDE 0.9 % IV SOLN: Freq: Once | INTRAVENOUS | Status: AC

## 2023-06-13 MED ORDER — SODIUM CHLORIDE 0.9% FLUSH
10.0000 mL | INTRAVENOUS | Status: AC
  Administered 2023-06-13: 10 mL via INTRAVENOUS

## 2023-06-13 MED ORDER — HEPARIN SOD (PORK) LOCK FLUSH 100 UNIT/ML IV SOLN
500.0000 [IU] | Freq: Once | INTRAVENOUS | Status: AC | PRN
  Administered 2023-06-13: 500 [IU]

## 2023-06-13 MED ORDER — SODIUM CHLORIDE 0.9 % IV SOLN
1500.0000 mg | Freq: Once | INTRAVENOUS | Status: AC
  Administered 2023-06-13: 1500 mg via INTRAVENOUS
  Filled 2023-06-13: qty 30

## 2023-06-13 NOTE — Patient Instructions (Signed)
 [facility] - A DEPT OF [facility]  Discharge Instructions: Thank you for choosing [facility] to provide your oncology and hematology care.  If you have a lab appointment with the Cancer Center - please note that after April 8th, 2024, all labs will be drawn in the cancer center.  You do not have to check in or register with the main entrance as you have in the past but will complete your check-in in the cancer center.  Wear comfortable clothing and clothing appropriate for easy access to any Portacath or PICC line.   We strive to give you quality time with your provider. You may need to reschedule your appointment if you arrive late (15 or more minutes).  Arriving late affects you and other patients whose appointments are after yours.  Also, if you miss three or more appointments without notifying the office, you may be dismissed from the clinic at the provider's discretion.      For prescription refill requests, have your pharmacy contact our office and allow 72 hours for refills to be completed.    Today you received the following chemotherapy and/or immunotherapy agents Imfinzi      To help prevent nausea and vomiting after your treatment, we encourage you to take your nausea medication as directed.  BELOW ARE SYMPTOMS THAT SHOULD BE REPORTED IMMEDIATELY: *FEVER GREATER THAN 100.4 F (38 C) OR HIGHER *CHILLS OR SWEATING *NAUSEA AND VOMITING THAT IS NOT CONTROLLED WITH YOUR NAUSEA MEDICATION *UNUSUAL SHORTNESS OF BREATH *UNUSUAL BRUISING OR BLEEDING *URINARY PROBLEMS (pain or burning when urinating, or frequent urination) *BOWEL PROBLEMS (unusual diarrhea, constipation, pain near the anus) TENDERNESS IN MOUTH AND THROAT WITH OR WITHOUT PRESENCE OF ULCERS (sore throat, sores in mouth, or a toothache) UNUSUAL RASH, SWELLING OR PAIN  UNUSUAL VAGINAL DISCHARGE OR ITCHING   Items with * indicate a potential emergency and should be followed up  as soon as possible or go to the Emergency Department if any problems should occur.  Please show the CHEMOTHERAPY ALERT CARD or IMMUNOTHERAPY ALERT CARD at check-in to the Emergency Department and triage nurse.  Should you have questions after your visit or need to cancel or reschedule your appointment, please contact [facility] [place] - A DEPT OF [facility] [phone]  and follow the prompts.  Office hours are 8:00 a.m. to 4:30 p.m. Monday - Friday. Please note that voicemails left after 4:00 p.m. may not be returned until the following business day.  We are closed weekends and major holidays. You have access to a nurse at all times for urgent questions. Please call the main number to the clinic [phone] and follow the prompts.  For any non-urgent questions, you may also contact your provider using MyChart. We now offer e-Visits for anyone 42 and older to request care online for non-urgent symptoms. For details visit [email or web address].   Also download the MyChart app! Go to the app store, search "MyChart", open the app, select [place], and log in with your MyChart username and password.

## 2023-06-13 NOTE — Progress Notes (Signed)
 Patient presents today for imfinzi  infusion per providers order.  Vital signs and labs within parameters for treatment.  Patient has no new complaints at this time.  Treatment given today per MD orders.  Stable during infusion without adverse affects.  Vital signs stable.  No complaints at this time.  Discharge from clinic ambulatory in stable condition.  Alert and oriented X 3.  Follow up with [facility] as scheduled.

## 2023-06-14 ENCOUNTER — Other Ambulatory Visit: Payer: Self-pay | Admitting: Hematology

## 2023-06-14 LAB — T4: T4, Total: 7.9 ug/dL (ref 4.5–12.0)

## 2023-06-17 ENCOUNTER — Other Ambulatory Visit: Payer: Self-pay

## 2023-06-19 ENCOUNTER — Telehealth: Payer: Self-pay | Admitting: Genetic Counselor

## 2023-06-19 NOTE — Telephone Encounter (Signed)
 Somatic report review--- sent message to Dr. 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

## 2023-06-21 DIAGNOSIS — G4700 Insomnia, unspecified: Secondary | ICD-10-CM | POA: Diagnosis not present

## 2023-06-21 DIAGNOSIS — K219 Gastro-esophageal reflux disease without esophagitis: Secondary | ICD-10-CM | POA: Diagnosis not present

## 2023-06-21 DIAGNOSIS — C3491 Malignant neoplasm of unspecified part of right bronchus or lung: Secondary | ICD-10-CM | POA: Diagnosis not present

## 2023-06-21 DIAGNOSIS — R002 Palpitations: Secondary | ICD-10-CM | POA: Diagnosis not present

## 2023-06-21 DIAGNOSIS — J449 Chronic obstructive pulmonary disease, unspecified: Secondary | ICD-10-CM | POA: Diagnosis not present

## 2023-06-21 DIAGNOSIS — E782 Mixed hyperlipidemia: Secondary | ICD-10-CM | POA: Diagnosis not present

## 2023-06-21 DIAGNOSIS — M5416 Radiculopathy, lumbar region: Secondary | ICD-10-CM | POA: Diagnosis not present

## 2023-06-21 DIAGNOSIS — R42 Dizziness and giddiness: Secondary | ICD-10-CM | POA: Diagnosis not present

## 2023-06-21 DIAGNOSIS — I700 Atherosclerosis of aorta: Secondary | ICD-10-CM | POA: Diagnosis not present

## 2023-06-21 DIAGNOSIS — D3502 Benign neoplasm of left adrenal gland: Secondary | ICD-10-CM | POA: Diagnosis not present

## 2023-06-21 NOTE — Progress Notes (Signed)
 Chana Comas, FNP with [facility] called wanting to know if it would be okay to start patient on Breztri or Barbra Ley since he is having difficulty breathing during the summer.  Spoke with Eduardo Grade, MD and she states that it would be fine for patient to start on either one.   Called and spoke with Chana Comas, FNP and she is aware that Breztri or Barbra Ley will be fine for patient to start.

## 2023-06-24 ENCOUNTER — Other Ambulatory Visit: Payer: Self-pay | Admitting: *Deleted

## 2023-07-05 DIAGNOSIS — I700 Atherosclerosis of aorta: Secondary | ICD-10-CM | POA: Diagnosis not present

## 2023-07-05 DIAGNOSIS — E782 Mixed hyperlipidemia: Secondary | ICD-10-CM | POA: Diagnosis not present

## 2023-07-05 DIAGNOSIS — R002 Palpitations: Secondary | ICD-10-CM | POA: Diagnosis not present

## 2023-07-05 DIAGNOSIS — M5416 Radiculopathy, lumbar region: Secondary | ICD-10-CM | POA: Diagnosis not present

## 2023-07-05 DIAGNOSIS — D3502 Benign neoplasm of left adrenal gland: Secondary | ICD-10-CM | POA: Diagnosis not present

## 2023-07-05 DIAGNOSIS — C3491 Malignant neoplasm of unspecified part of right bronchus or lung: Secondary | ICD-10-CM | POA: Diagnosis not present

## 2023-07-05 DIAGNOSIS — K219 Gastro-esophageal reflux disease without esophagitis: Secondary | ICD-10-CM | POA: Diagnosis not present

## 2023-07-05 DIAGNOSIS — J449 Chronic obstructive pulmonary disease, unspecified: Secondary | ICD-10-CM | POA: Diagnosis not present

## 2023-07-05 DIAGNOSIS — G4700 Insomnia, unspecified: Secondary | ICD-10-CM | POA: Diagnosis not present

## 2023-07-05 DIAGNOSIS — R42 Dizziness and giddiness: Secondary | ICD-10-CM | POA: Diagnosis not present

## 2023-07-09 DIAGNOSIS — I10 Essential (primary) hypertension: Secondary | ICD-10-CM | POA: Diagnosis not present

## 2023-07-09 DIAGNOSIS — I700 Atherosclerosis of aorta: Secondary | ICD-10-CM | POA: Diagnosis not present

## 2023-07-09 DIAGNOSIS — E782 Mixed hyperlipidemia: Secondary | ICD-10-CM | POA: Diagnosis not present

## 2023-07-11 ENCOUNTER — Inpatient Hospital Stay: Attending: Hematology

## 2023-07-11 ENCOUNTER — Inpatient Hospital Stay

## 2023-07-11 ENCOUNTER — Encounter: Payer: Self-pay | Admitting: *Deleted

## 2023-07-11 VITALS — BP 152/79 | HR 85 | Temp 97.7°F | Resp 18

## 2023-07-11 DIAGNOSIS — Z79899 Other long term (current) drug therapy: Secondary | ICD-10-CM | POA: Diagnosis not present

## 2023-07-11 DIAGNOSIS — D3502 Benign neoplasm of left adrenal gland: Secondary | ICD-10-CM | POA: Diagnosis not present

## 2023-07-11 DIAGNOSIS — C3491 Malignant neoplasm of unspecified part of right bronchus or lung: Secondary | ICD-10-CM

## 2023-07-11 DIAGNOSIS — F419 Anxiety disorder, unspecified: Secondary | ICD-10-CM | POA: Diagnosis not present

## 2023-07-11 DIAGNOSIS — Z5112 Encounter for antineoplastic immunotherapy: Secondary | ICD-10-CM | POA: Diagnosis not present

## 2023-07-11 DIAGNOSIS — C3411 Malignant neoplasm of upper lobe, right bronchus or lung: Secondary | ICD-10-CM | POA: Insufficient documentation

## 2023-07-11 DIAGNOSIS — J432 Centrilobular emphysema: Secondary | ICD-10-CM | POA: Diagnosis not present

## 2023-07-11 DIAGNOSIS — Z95828 Presence of other vascular implants and grafts: Secondary | ICD-10-CM

## 2023-07-11 DIAGNOSIS — F32A Depression, unspecified: Secondary | ICD-10-CM | POA: Insufficient documentation

## 2023-07-11 LAB — TSH: TSH: 1.229 u[IU]/mL (ref 0.350–4.500)

## 2023-07-11 LAB — MAGNESIUM: Magnesium: 1.7 mg/dL (ref 1.7–2.4)

## 2023-07-11 LAB — CBC WITH DIFFERENTIAL/PLATELET
Abs Immature Granulocytes: 0.05 10*3/uL (ref 0.00–0.07)
Basophils Absolute: 0.1 10*3/uL (ref 0.0–0.1)
Basophils Relative: 1 %
Eosinophils Absolute: 0.2 10*3/uL (ref 0.0–0.5)
Eosinophils Relative: 2 %
HCT: 41.3 % (ref 39.0–52.0)
Hemoglobin: 14.6 g/dL (ref 13.0–17.0)
Immature Granulocytes: 1 %
Lymphocytes Relative: 5 %
Lymphs Abs: 0.5 10*3/uL — ABNORMAL LOW (ref 0.7–4.0)
MCH: 32.4 pg (ref 26.0–34.0)
MCHC: 35.4 g/dL (ref 30.0–36.0)
MCV: 91.6 fL (ref 80.0–100.0)
Monocytes Absolute: 0.8 10*3/uL (ref 0.1–1.0)
Monocytes Relative: 8 %
Neutro Abs: 7.8 10*3/uL — ABNORMAL HIGH (ref 1.7–7.7)
Neutrophils Relative %: 83 %
Platelets: 275 10*3/uL (ref 150–400)
RBC: 4.51 MIL/uL (ref 4.22–5.81)
RDW: 15.1 % (ref 11.5–15.5)
WBC: 9.3 10*3/uL (ref 4.0–10.5)
nRBC: 0 % (ref 0.0–0.2)

## 2023-07-11 LAB — COMPREHENSIVE METABOLIC PANEL WITH GFR
ALT: 34 U/L (ref 0–44)
AST: 27 U/L (ref 15–41)
Albumin: 4 g/dL (ref 3.5–5.0)
Alkaline Phosphatase: 63 U/L (ref 38–126)
Anion gap: 12 (ref 5–15)
BUN: 8 mg/dL (ref 8–23)
CO2: 20 mmol/L — ABNORMAL LOW (ref 22–32)
Calcium: 8.8 mg/dL — ABNORMAL LOW (ref 8.9–10.3)
Chloride: 97 mmol/L — ABNORMAL LOW (ref 98–111)
Creatinine, Ser: 0.72 mg/dL (ref 0.61–1.24)
GFR, Estimated: >60 mL/min (ref >60)
Glucose, Bld: 135 mg/dL — ABNORMAL HIGH (ref 70–99)
Potassium: 3.6 mmol/L (ref 3.5–5.1)
Sodium: 129 mmol/L — ABNORMAL LOW (ref 135–145)
Total Bilirubin: 0.6 mg/dL (ref 0.0–1.2)
Total Protein: 7.1 g/dL (ref 6.5–8.1)

## 2023-07-11 MED ORDER — SODIUM CHLORIDE 0.9 % IV SOLN
1500.0000 mg | Freq: Once | INTRAVENOUS | Status: AC
  Administered 2023-07-11: 1500 mg via INTRAVENOUS
  Filled 2023-07-11: qty 30

## 2023-07-11 MED ORDER — SODIUM CHLORIDE 0.9% FLUSH
10.0000 mL | INTRAVENOUS | Status: DC | PRN
  Administered 2023-07-11: 10 mL

## 2023-07-11 MED ORDER — SODIUM CHLORIDE 0.9 % IV SOLN: Freq: Once | INTRAVENOUS | Status: AC

## 2023-07-11 MED ORDER — SODIUM CHLORIDE 0.9% FLUSH
10.0000 mL | Freq: Once | INTRAVENOUS | Status: AC
  Administered 2023-07-11: 10 mL via INTRAVENOUS

## 2023-07-11 MED ORDER — HEPARIN SOD (PORK) LOCK FLUSH 100 UNIT/ML IV SOLN
500.0000 [IU] | Freq: Once | INTRAVENOUS | Status: AC | PRN
  Administered 2023-07-11: 500 [IU]

## 2023-07-11 NOTE — Patient Instructions (Signed)
 [facility] [place] [facility]  Discharge Instructions: Thank you for choosing [facility] to provide your oncology and hematology care.  If you have a lab appointment with the Cancer Center - please note that after April 8th, 2024, all labs will be drawn in the cancer center.  You do not have to check in or register with the main entrance as you have in the past but will complete your check-in in the cancer center.  Wear comfortable clothing and clothing appropriate for easy access to any Portacath or PICC line.   We strive to give you quality time with your provider. You may need to reschedule your appointment if you arrive late (15 or more minutes).  Arriving late affects you and other patients whose appointments are after yours.  Also, if you miss three or more appointments without notifying the office, you may be dismissed from the clinic at the provider's discretion.      For prescription refill requests, have your pharmacy contact our office and allow 72 hours for refills to be completed.    Today you received the following chemotherapy and/or immunotherapy agents Imfinzi, return as scheduled.   To help prevent nausea and vomiting after your treatment, we encourage you to take your nausea medication as directed.  BELOW ARE SYMPTOMS THAT SHOULD BE REPORTED IMMEDIATELY: *FEVER GREATER THAN 100.4 F (38 C) OR HIGHER *CHILLS OR SWEATING *NAUSEA AND VOMITING THAT IS NOT CONTROLLED WITH YOUR NAUSEA MEDICATION *UNUSUAL SHORTNESS OF BREATH *UNUSUAL BRUISING OR BLEEDING *URINARY PROBLEMS (pain or burning when urinating, or frequent urination) *BOWEL PROBLEMS (unusual diarrhea, constipation, pain near the anus) TENDERNESS IN MOUTH AND THROAT WITH OR WITHOUT PRESENCE OF ULCERS (sore throat, sores in mouth, or a toothache) UNUSUAL RASH, SWELLING OR PAIN  UNUSUAL VAGINAL DISCHARGE OR ITCHING   Items with * indicate a potential emergency and  should be followed up as soon as possible or go to the Emergency Department if any problems should occur.  Please show the CHEMOTHERAPY ALERT CARD or IMMUNOTHERAPY ALERT CARD at check-in to the Emergency Department and triage nurse.  Should you have questions after your visit or need to cancel or reschedule your appointment, please contact [facility] [place] - A DEPT OF Eligha Bridegroom [facility] [phone]  and follow the prompts.  Office hours are 8:00 a.m. to 4:30 p.m. Monday - Friday. Please note that voicemails left after 4:00 p.m. may not be returned until the following business day.  We are closed weekends and major holidays. You have access to a nurse at all times for urgent questions. Please call the main number to the clinic [phone] and follow the prompts.  For any non-urgent questions, you may also contact your provider using MyChart. We now offer e-Visits for anyone 51 and older to request care online for non-urgent symptoms. For details visit [email or web address].   Also download the MyChart app! Go to the app store, search "MyChart", open the app, select [place], and log in with your MyChart username and password.

## 2023-07-11 NOTE — Progress Notes (Signed)
 Patient presents today for Imfinzi , labs are within treatment parameter. Patient reports some worsening SOB, sodium is 129, Dr. Rogers made aware. Patient okay for treatment per Dr. Katragadda. Patient tolerated therapy with no complaints voiced. Side effects with management reviewed with understanding verbalized. Port site clean and dry with no bruising or swelling noted at site. Good blood return noted before and after administration of therapy. Band aid applied. Patient left in satisfactory condition with VSS and no s/s of distress noted.

## 2023-07-12 LAB — T4: T4, Total: 7.1 ug/dL (ref 4.5–12.0)

## 2023-07-17 ENCOUNTER — Telehealth: Payer: Self-pay

## 2023-07-17 DIAGNOSIS — R062 Wheezing: Secondary | ICD-10-CM | POA: Diagnosis not present

## 2023-07-17 DIAGNOSIS — J3489 Other specified disorders of nose and nasal sinuses: Secondary | ICD-10-CM | POA: Diagnosis not present

## 2023-07-17 DIAGNOSIS — R0602 Shortness of breath: Secondary | ICD-10-CM | POA: Diagnosis not present

## 2023-07-17 DIAGNOSIS — C3491 Malignant neoplasm of unspecified part of right bronchus or lung: Secondary | ICD-10-CM | POA: Diagnosis not present

## 2023-07-17 NOTE — Telephone Encounter (Signed)
 Copied from CRM [id number]. Topic: Appointments - Scheduling Inquiry for Clinic >> Jul 11, 2023 10:49 AM Corean SAUNDERS wrote: Reason for CRM: Patients oncologist has advised patient to make a follow up appointment with Dr. Shelah for his trouble breathing and inhaler concerns. E2C2 attempted to schedule but there are no timely appointments available with Dr. Shelah.  Please call patients sister Carlos [name]) back to schedule the patient [phone]   Called and spoke with [name]. I offered pt sooner consult appts with different provider and pt denied stating he would only like to see Dr. Shelah Pt has been scheduled & is aware. Nfn

## 2023-07-19 ENCOUNTER — Other Ambulatory Visit: Payer: Self-pay

## 2023-07-23 DIAGNOSIS — M5416 Radiculopathy, lumbar region: Secondary | ICD-10-CM | POA: Diagnosis not present

## 2023-07-23 DIAGNOSIS — J449 Chronic obstructive pulmonary disease, unspecified: Secondary | ICD-10-CM | POA: Diagnosis not present

## 2023-07-23 DIAGNOSIS — I700 Atherosclerosis of aorta: Secondary | ICD-10-CM | POA: Diagnosis not present

## 2023-07-23 DIAGNOSIS — C3491 Malignant neoplasm of unspecified part of right bronchus or lung: Secondary | ICD-10-CM | POA: Diagnosis not present

## 2023-07-23 DIAGNOSIS — K219 Gastro-esophageal reflux disease without esophagitis: Secondary | ICD-10-CM | POA: Diagnosis not present

## 2023-07-23 DIAGNOSIS — R002 Palpitations: Secondary | ICD-10-CM | POA: Diagnosis not present

## 2023-07-23 DIAGNOSIS — D3502 Benign neoplasm of left adrenal gland: Secondary | ICD-10-CM | POA: Diagnosis not present

## 2023-07-23 DIAGNOSIS — G4700 Insomnia, unspecified: Secondary | ICD-10-CM | POA: Diagnosis not present

## 2023-07-23 DIAGNOSIS — R42 Dizziness and giddiness: Secondary | ICD-10-CM | POA: Diagnosis not present

## 2023-07-23 DIAGNOSIS — E782 Mixed hyperlipidemia: Secondary | ICD-10-CM | POA: Diagnosis not present

## 2023-07-29 ENCOUNTER — Ambulatory Visit (HOSPITAL_COMMUNITY)
Admission: RE | Admit: 2023-07-29 | Discharge: 2023-07-29 | Disposition: A | Discharge status: Home / Self Care | Source: Ambulatory Visit | Attending: Hematology | Admitting: Hematology

## 2023-07-29 ENCOUNTER — Telehealth: Payer: Self-pay | Admitting: *Deleted

## 2023-07-29 DIAGNOSIS — C3490 Malignant neoplasm of unspecified part of unspecified bronchus or lung: Secondary | ICD-10-CM | POA: Diagnosis not present

## 2023-07-29 DIAGNOSIS — J432 Centrilobular emphysema: Secondary | ICD-10-CM | POA: Diagnosis not present

## 2023-07-29 DIAGNOSIS — C3491 Malignant neoplasm of unspecified part of right bronchus or lung: Secondary | ICD-10-CM | POA: Diagnosis not present

## 2023-07-29 DIAGNOSIS — I700 Atherosclerosis of aorta: Secondary | ICD-10-CM | POA: Diagnosis not present

## 2023-07-29 MED ORDER — IOHEXOL 300 MG/ML  SOLN
80.0000 mL | Freq: Once | INTRAMUSCULAR | Status: AC | PRN
  Administered 2023-07-29: 80 mL via INTRAVENOUS

## 2023-07-29 NOTE — Telephone Encounter (Signed)
 Received call from sister Olam stating that patient is having more issues with breathing.  Requested visit with Dr. Katragadda today, as Dr. Shelah cannot see him until September 3rd.  He is to have a CT Chest today.  Per Dr. Rogers advised him to have scan done and we will review and respond once it has been read.  Patient is not in any acute distress at this time.  Spoke to CT and requested a STAT read on study.  Will follow up accordingly.

## 2023-07-30 NOTE — Telephone Encounter (Signed)
 CT Chest reviewed by Dr. Rogers.  Recommended earlier appointment with Dr, Shelah.  Called office and requested an ASAP appointment.  MD will review and notify patient if they can see him sooner than his scheduled appointment.  Patient made aware and advised to seek immediate medical attention if he experiences any distress.  Verbalized understanding.

## 2023-07-31 DIAGNOSIS — R002 Palpitations: Secondary | ICD-10-CM | POA: Diagnosis not present

## 2023-07-31 DIAGNOSIS — C3491 Malignant neoplasm of unspecified part of right bronchus or lung: Secondary | ICD-10-CM | POA: Diagnosis not present

## 2023-07-31 DIAGNOSIS — M5416 Radiculopathy, lumbar region: Secondary | ICD-10-CM | POA: Diagnosis not present

## 2023-07-31 DIAGNOSIS — J449 Chronic obstructive pulmonary disease, unspecified: Secondary | ICD-10-CM | POA: Diagnosis not present

## 2023-07-31 DIAGNOSIS — K219 Gastro-esophageal reflux disease without esophagitis: Secondary | ICD-10-CM | POA: Diagnosis not present

## 2023-07-31 DIAGNOSIS — R42 Dizziness and giddiness: Secondary | ICD-10-CM | POA: Diagnosis not present

## 2023-07-31 DIAGNOSIS — G4700 Insomnia, unspecified: Secondary | ICD-10-CM | POA: Diagnosis not present

## 2023-07-31 DIAGNOSIS — E782 Mixed hyperlipidemia: Secondary | ICD-10-CM | POA: Diagnosis not present

## 2023-07-31 DIAGNOSIS — D3502 Benign neoplasm of left adrenal gland: Secondary | ICD-10-CM | POA: Diagnosis not present

## 2023-07-31 DIAGNOSIS — I700 Atherosclerosis of aorta: Secondary | ICD-10-CM | POA: Diagnosis not present

## 2023-08-05 ENCOUNTER — Telehealth: Payer: Self-pay

## 2023-08-05 NOTE — Telephone Encounter (Signed)
 Copied from CRM [id number]. Topic: Appointments - Scheduling Inquiry for Clinic >> Jul 30, 2023 10:09 AM Rozanna MATSU wrote: Reason for CRM: TOMMIE WITH [facility] STATED HE HAD CT ON YESTERDAY AND DR KATRAGADDA IS WANTING TO SEE IF HE CAN BE SEEN BEFORE SEPTEMBER DUE TO THE FINDINGS. THANKS  Dr. Shelah you do not have sooner appt options nor does NP. Please see previous encounters regarding this. Dr. Shelah would you please review the scan and I will contact the patient

## 2023-08-06 NOTE — Telephone Encounter (Signed)
 ATC pt x1. No answer- left vm for patient to call back to schedule appt.  Routing to front staff to schedule pt.   Front staff please double book RB 8/7 at 11:30

## 2023-08-06 NOTE — Telephone Encounter (Signed)
 I reviewed his scan.  Its overall stable to improved compared with his prior likely reflecting a response to his treatment.  There is some new infiltrate that could be consistent with radiation induced change It sounds like he has been having more dyspnea.  Agree with getting him in to be seen sooner to evaluate for possible radiation pneumonitis.  Okay to double book him with RB at the end of morning session or end of p.m. session.  Thank you

## 2023-08-08 ENCOUNTER — Inpatient Hospital Stay

## 2023-08-08 ENCOUNTER — Inpatient Hospital Stay (HOSPITAL_BASED_OUTPATIENT_CLINIC_OR_DEPARTMENT_OTHER): Admitting: Hematology

## 2023-08-08 VITALS — BP 137/79 | HR 120 | Temp 98.0°F | Resp 22 | Wt 177.7 lb

## 2023-08-08 DIAGNOSIS — R1011 Right upper quadrant pain: Secondary | ICD-10-CM

## 2023-08-08 DIAGNOSIS — Z5112 Encounter for antineoplastic immunotherapy: Secondary | ICD-10-CM | POA: Diagnosis not present

## 2023-08-08 DIAGNOSIS — J432 Centrilobular emphysema: Secondary | ICD-10-CM | POA: Diagnosis not present

## 2023-08-08 DIAGNOSIS — Z79899 Other long term (current) drug therapy: Secondary | ICD-10-CM | POA: Diagnosis not present

## 2023-08-08 DIAGNOSIS — Z95828 Presence of other vascular implants and grafts: Secondary | ICD-10-CM

## 2023-08-08 DIAGNOSIS — C3491 Malignant neoplasm of unspecified part of right bronchus or lung: Secondary | ICD-10-CM

## 2023-08-08 DIAGNOSIS — C3411 Malignant neoplasm of upper lobe, right bronchus or lung: Secondary | ICD-10-CM | POA: Diagnosis not present

## 2023-08-08 DIAGNOSIS — D3502 Benign neoplasm of left adrenal gland: Secondary | ICD-10-CM | POA: Diagnosis not present

## 2023-08-08 LAB — COMPREHENSIVE METABOLIC PANEL WITH GFR
ALT: 142 U/L — ABNORMAL HIGH (ref 0–44)
AST: 108 U/L — ABNORMAL HIGH (ref 15–41)
Albumin: 4 g/dL (ref 3.5–5.0)
Alkaline Phosphatase: 78 U/L (ref 38–126)
Anion gap: 14 (ref 5–15)
BUN: 9 mg/dL (ref 8–23)
CO2: 23 mmol/L (ref 22–32)
Calcium: 8.9 mg/dL (ref 8.9–10.3)
Chloride: 96 mmol/L — ABNORMAL LOW (ref 98–111)
Creatinine, Ser: 0.88 mg/dL (ref 0.61–1.24)
GFR, Estimated: >60 mL/min (ref >60)
Glucose, Bld: 178 mg/dL — ABNORMAL HIGH (ref 70–99)
Potassium: 3.6 mmol/L (ref 3.5–5.1)
Sodium: 133 mmol/L — ABNORMAL LOW (ref 135–145)
Total Bilirubin: 1.6 mg/dL — ABNORMAL HIGH (ref 0.0–1.2)
Total Protein: 7.1 g/dL (ref 6.5–8.1)

## 2023-08-08 LAB — CBC WITH DIFFERENTIAL/PLATELET
Abs Immature Granulocytes: 0.04 K/uL (ref 0.00–0.07)
Basophils Absolute: 0.1 K/uL (ref 0.0–0.1)
Basophils Relative: 1 %
Eosinophils Absolute: 0.1 K/uL (ref 0.0–0.5)
Eosinophils Relative: 1 %
HCT: 45.2 % (ref 39.0–52.0)
Hemoglobin: 16 g/dL (ref 13.0–17.0)
Immature Granulocytes: 0 %
Lymphocytes Relative: 4 %
Lymphs Abs: 0.4 K/uL — ABNORMAL LOW (ref 0.7–4.0)
MCH: 32.3 pg (ref 26.0–34.0)
MCHC: 35.4 g/dL (ref 30.0–36.0)
MCV: 91.1 fL (ref 80.0–100.0)
Monocytes Absolute: 0.9 K/uL (ref 0.1–1.0)
Monocytes Relative: 9 %
Neutro Abs: 9.4 K/uL — ABNORMAL HIGH (ref 1.7–7.7)
Neutrophils Relative %: 85 %
Platelets: 277 K/uL (ref 150–400)
RBC: 4.96 MIL/uL (ref 4.22–5.81)
RDW: 15.3 % (ref 11.5–15.5)
WBC: 10.9 K/uL — ABNORMAL HIGH (ref 4.0–10.5)
nRBC: 0 % (ref 0.0–0.2)

## 2023-08-08 LAB — MAGNESIUM: Magnesium: 1.6 mg/dL — ABNORMAL LOW (ref 1.7–2.4)

## 2023-08-08 LAB — TSH: TSH: 1.647 u[IU]/mL (ref 0.350–4.500)

## 2023-08-08 LAB — T4, FREE: Free T4: 1.14 ng/dL — ABNORMAL HIGH (ref 0.61–1.12)

## 2023-08-08 MED ORDER — HEPARIN SOD (PORK) LOCK FLUSH 100 UNIT/ML IV SOLN
500.0000 [IU] | Freq: Once | INTRAVENOUS | Status: AC
  Administered 2023-08-08: 500 [IU] via INTRAVENOUS

## 2023-08-08 MED ORDER — SODIUM CHLORIDE 0.9% FLUSH
10.0000 mL | INTRAVENOUS | Status: DC | PRN
  Administered 2023-08-08: 10 mL via INTRAVENOUS

## 2023-08-08 NOTE — Patient Instructions (Addendum)
 [facility] at [facility] Discharge Instructions   You were seen and examined today by Dr. Rogers.  He reviewed the results of your lab work which are mostly normal/stable. Your liver numbers are elevated.   He reviewed the results of your CT scan which is stable. The cancer has not grown or spread.   Start taking Pepcid  20 mg twice a day. This medication can be bought over the counter.   We will hold your final treatment today.   Return as scheduled.    Thank you for choosing [facility] at [facility] to provide your oncology and hematology care.  To afford each patient quality time with our provider, please arrive at least 15 minutes before your scheduled appointment time.   If you have a lab appointment with the Cancer Center please come in thru the Main Entrance and check in at the main information desk.  You need to re-schedule your appointment should you arrive 10 or more minutes late.  We strive to give you quality time with our providers, and arriving late affects you and other patients whose appointments are after yours.  Also, if you no show three or more times for appointments you may be dismissed from the clinic at the providers discretion.     Again, thank you for choosing [facility].  Our hope is that these requests will decrease the amount of time that you wait before being seen by our physicians.       _____________________________________________________________  Should you have questions after your visit to [facility], please contact our office at [phone] and follow the prompts.  Our office hours are 8:00 a.m. and 4:30 p.m. Monday - Friday.  Please note that voicemails left after 4:00 p.m. may not be returned until the following business day.  We are closed weekends and major holidays.  You do have access to a nurse 24-7, just call the main number to the clinic [phone] and do not press  any options, hold on the line and a nurse will answer the phone.    For prescription refill requests, have your pharmacy contact our office and allow 72 hours.    Due to Covid, you will need to wear a mask upon entering the hospital. If you do not have a mask, a mask will be given to you at the Main Entrance upon arrival. For doctor visits, patients may have 1 support person age 37 or older with them. For treatment visits, patients can not have anyone with them due to social distancing guidelines and our immunocompromised population.

## 2023-08-08 NOTE — Progress Notes (Signed)
 Treatment held today due to elevated LFT's per Dr.Katragadda.

## 2023-08-08 NOTE — Progress Notes (Signed)
 Patients port flushed without difficulty.  Good blood return noted with no bruising or swelling noted at site.  Patient remains accessed for treatment.

## 2023-08-08 NOTE — Progress Notes (Signed)
 [facility] [address], [place] [address]    Clinic Day:  08/08/23   Referring physician: Shona Norleen PEDLAR, MD  Patient Care Team: Carlos Norleen PEDLAR, MD as PCP - General (Internal Medicine) Carlos Hai, MD as Medical Oncologist (Medical Oncology) Carlos Joesph SQUIBB, RN as Oncology Nurse Navigator (Medical Oncology)   ASSESSMENT & PLAN:   Assessment: 1.  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  No hemoptysis/weight loss. - Chest x-ray (04/10/2022): Mid right lung mass. - CT chest (04/13/2022): 9.2 x 5.7 x 6.6 cm right upper lobe mass with lobulations appearing to extend across the minor fissure into the right middle lobe and right lower lobe.  Satellite lesion in the right upper lobe measures 1.6 x 1.4 x 1.4 cm.  No findings of chest wall invasion.  Borderline prominent lower right paratracheal lymph node 0.9 cm.  Old granulomatous disease.  Small left adrenal adenoma. - Has right posterior chest wall pain for the past 6 months, dull type.  He is not requiring any pain medication. - Brain MRI (04/19/2022): No metastatic disease. - PET scan (04/19/2022): 9 x 5.7 cm right upper lobe mass with SUV 11.8.  1.3 x 0.9 cm satellite nodule in the right upper lobe with SUV 3.8.  No adenopathy or distant metastatic disease. - Bronchoscopy and biopsy by Dr. Shelah - Pathology (04/23/2022): Both right upper lobe lung nodule FNA consistent with poorly differentiated carcinoma.  IHC diffusely positive for TTF-1, Napsin, synaptophysin and CK7.  CD56 negative.  P63 and CK5/6 negative.  Given Napsin positivity, favor adenocarcinoma.  There is also diffuse synaptophysin positivity consistent with neuroendocrine differentiation.  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 - Discussed with Dr. Kerrin.  Not a surgical candidate as he requires pneumonectomy and has  suboptimal DLCO. - Chemoradiation with carboplatin  and paclitaxel  from 05/22/2022 through 07/06/2022 - Consolidation durvalumab  started on 07/27/2022   2.  Social/family history: - He is seen with his 2 sisters today.  Lives by himself and is independent of ADLs and IADLs.  He retired after working at [employer] for the last 34 years.  No asbestos exposure.  No other chemicals.  Smokes more than 1 pack/day for the last 50 years (started at age 67) quit smoking for 5 years before starting back again. - Brother died of lung cancer.  Father had bladder cancer.  Maternal uncle had cancer.    Plan: 1.  Stage IIIa adenocarcinoma of the right lung: - He is tolerating durvalumab  reasonably well. - He reported epigastric pain for the last couple of weeks.  He is no longer taking Protonix. - Labs today: AST and ALT elevated at 108 and 142.  Total bilirubin 1.6.  CBC was grossly normal.TSH is 1.6. - CT chest on 07/29/2023): Right upper lobe lung mass is stable.  No thoracic adenopathy. - As his LFTs are high, will hold treatment today.  Will obtain right upper quadrant ultrasound.  He will start Pepcid  twice daily.  RTC 1 week with LFTs.  If they normalize he will receive his last dose of durvalumab .  After that we will repeat CT chest in 4 months.  If it is stable, he will have imaging done every 6 months.    2.  Anxiety/depression: - Continue clonazepam  1 mg twice daily which is helping.   3.  COPD: - Continue Tessalon   Perles as needed.    Orders Placed This Encounter  Procedures   US  Abdomen Limited RUQ (LIVER/GB)    Standing Status:   Future    Expected Date:   08/15/2023    Expiration Date:   08/07/2024    Reason for Exam (SYMPTOM  OR DIAGNOSIS REQUIRED):   RUQ pain; elevated LFTs    Preferred imaging location?:   [facility]      I,Carlos [name],acting as a scribe for Carlos Stands, MD.,have documented all relevant documentation on the behalf of Carlos Stands,  MD,as directed by  Carlos Stands, MD while in the presence of Carlos Stands, MD.  I, Carlos Stands MD, have reviewed the above documentation for accuracy and completeness, and I agree with the above.      Carlos Stands, MD   7/31/20253:58 PM  CHIEF COMPLAINT:   Diagnosis: RUL lung adenocarcinoma    Cancer Staging  Non-small cell cancer of right lung [facility]) Staging form: Lung, AJCC 8th Edition - Clinical stage from 05/01/2022: Stage IIIA (cT4, cN0, cM0) - Unsigned    Prior Therapy: none  Current Therapy:  concurrent chemoradiation with carboplatin  + paclitaxel     HISTORY OF PRESENT ILLNESS:   Oncology History  Non-small cell cancer of right lung (HCC)  05/01/2022 Initial Diagnosis   Non-small cell cancer of right lung (HCC)   05/22/2022 - 07/05/2022 Chemotherapy   Patient is on Treatment Plan : LUNG Carboplatin  + Paclitaxel  + XRT q7d     07/27/2022 -  Chemotherapy   Patient is on Treatment Plan : LUNG Durvalumab  q 28 day (beginning 11/22/22)        INTERVAL HISTORY:   Carlos [name] is a 68 y.o. [name] presenting to clinic today for follow up of RUL lung adenocarcinoma. He was last seen by me on 05/16/2023.  Since his last visit, he underwent CT chest on 07/29/2023 that found:  Dominant inferior right upper lobe lung mass is similar to minimally decreased in size. Progressive surrounding interstitial thickening is likely radiation induced. Right upper lobe pulmonary nodule is similar. Equivocal smaller pulmonary nodules on the prior exam are improved and resolved, possibly response to therapy of metastasis or inflammatory etiology. No thoracic adenopathy. Incidental findings, including: Aortic atherosclerosis, coronary artery atherosclerosis and emphysema. Hepatic steatosis.  Today, he states that he is doing well overall. His appetite level is at 50%. His energy level is at 75%. Carlos [name] is accompanied by family members.   He [name] band-like epigastric pain for  the past 2 weeks that occurs after eating. He notes associated indigestion symptoms though he denies any prior acid reflux issues. He does not believe he is taking protonix.   Carlos [name] worsened with the hot weather and humidity, which he attempted to treat with an albuterol  inhaler. He states the inhaler caused tachycardia. He was also told caffeine caused elevated heart rate and stopped drinking coffee, however tachycardia persisted after using inhaler again and he has not attempted to use the inhaler since then.   Carlos [name] with Dr. Shelah on 08/15/2023.   PAST MEDICAL HISTORY:   Past Medical History: Past Medical History:  Diagnosis Date   Anxiety    Cancer (HCC)    Skin cancer left shoulder   Chronic low back pain    Complication of anesthesia    Dermatofibrosarcoma protubera of right shoulder    Dyspnea    GERD (gastroesophageal reflux disease)    Hyperlipidemia    Lung cancer (HCC) 04/23/2022  PONV (postoperative nausea and vomiting)    Port-A-Cath in place 05/17/2022   Prostate cancer [facility])     Surgical History: Past Surgical History:  Procedure Laterality Date   APPENDECTOMY     APPLICATION OF A-CELL OF EXTREMITY Right 07/09/2012   Procedure: PLACEMENT OF A-CELL TO UPPER EXTREMITY/PLACEMENT OF VAC;  Surgeon: Estefana Reichert, DO;  Location: [facility];  Service: Plastics;  Laterality: Right;   BRONCHIAL BRUSHINGS  04/23/2022   Procedure: BRONCHIAL BRUSHINGS;  Surgeon: Carlos [name] Lamar RAMAN, MD;  Location: [facility] ENDOSCOPY;  Service: Pulmonary;;   BRONCHIAL NEEDLE ASPIRATION BIOPSY  04/23/2022   Procedure: BRONCHIAL NEEDLE ASPIRATION BIOPSIES;  Surgeon: Carlos [name] Lamar RAMAN, MD;  Location: [facility] ENDOSCOPY;  Service: Pulmonary;;   COLONOSCOPY WITH PROPOFOL  N/A 09/30/2015   Procedure: COLONOSCOPY WITH PROPOFOL ;  Surgeon: Claudis RAYMOND Rivet, MD;  Location: [facility] ENDO [facility];  Service: Endoscopy;  Laterality: N/A;  830   EXCISION DERMATOFIBROCARCOMA PROTUBERAN RIGHT  SHOULDER  2 - 3 WKS AGO   HEMOSTASIS CONTROL  04/23/2022   Procedure: HEMOSTASIS CONTROL;  Surgeon: Carlos [name] Lamar RAMAN, MD;  Location: [facility] ENDOSCOPY;  Service: Pulmonary;;   INGUINAL HERNIA REPAIR Left 1999   IR IMAGING GUIDED PORT INSERTION  04/30/2022   KNEE ARTHROSCOPY Left 2012   POLYPECTOMY  09/30/2015   Procedure: POLYPECTOMY;  Surgeon: Claudis RAYMOND Rivet, MD;  Location: [facility];  Service: Endoscopy;;  sigmoid   VIDEO BRONCHOSCOPY WITH ENDOBRONCHIAL ULTRASOUND Bilateral 04/23/2022   Procedure: VIDEO BRONCHOSCOPY WITH ENDOBRONCHIAL ULTRASOUND;  Surgeon: Carlos [name] Lamar RAMAN, MD;  Location: [facility] ENDOSCOPY;  Service: Pulmonary;  Laterality: Bilateral;   VIDEO BRONCHOSCOPY WITH RADIAL ENDOBRONCHIAL ULTRASOUND  04/23/2022   Procedure: VIDEO BRONCHOSCOPY WITH RADIAL ENDOBRONCHIAL ULTRASOUND;  Surgeon: Carlos [name] Lamar RAMAN, MD;  Location: [facility] ENDOSCOPY;  Service: Pulmonary;;    Social History: Social History   Socioeconomic History   Marital status: Divorced    Spouse name: Apolinar   Number of children: 1   Years of education: 10th   Highest education level: Not on file  Occupational History    Employer: [employer]    Comment: [employer]  Tobacco Use   Smoking status: Every Day    Current packs/day: 1.00    Average packs/day: 1 pack/day for 40.0 years (40.0 ttl pk-yrs)    Types: Cigarettes   Smokeless tobacco: Never  Vaping Use   Vaping status: Never Used  Substance and Sexual Activity   Alcohol use: Yes    Comment: 2-3 cans beer/day   Drug use: No   Sexual activity: Not on file  Other Topics Concern   Not on file  Social History Narrative   Patient lives at home with his dog. 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 Patient has 10 grade education.Patient is right-handed.Caffeine Use: 1 cup of coffee; 3-4 sodas daily   Social Drivers of Health   Financial Resource Strain: High Risk (05/10/2022)   Overall Financial Resource Strain (CARDIA)     Difficulty of Paying Living Expenses: Very hard  Food Insecurity: Food Insecurity Present (05/04/2022)   Hunger Vital Sign    Worried About Running Out of Food in the Last Year: Sometimes true    Ran Out of Food in the Last Year: Sometimes true  Transportation Needs: No Transportation Needs (05/04/2022)   PRAPARE - Administrator, Civil Service (Medical): No    Lack of Transportation (Non-Medical): No  Physical Activity: Not on file  Stress: Not on file  Social Connections: Not on file  Intimate Partner Violence: Not At Risk (05/04/2022)   Humiliation, Afraid, Rape, and Kick questionnaire    Fear of Current or Ex-Partner: No    Emotionally Abused: No    Physically Abused: No    Sexually Abused: No    Family History: Family History  Problem Relation Age of Onset   Dementia Mother    Cancer Father     Current Medications:  Current Outpatient Medications:    acetaminophen  (TYLENOL ) 500 MG tablet, Take 1,000 mg by mouth every 6 (six) hours as needed (pain.)., Disp: , Rfl:    albuterol  (VENTOLIN  HFA) 108 (90 Base) MCG/ACT inhaler, Inhale 2 puffs into the lungs every 6 (six) hours as needed for wheezing or shortness of breath., Disp: 8 g, Rfl: 6   aspirin EC 81 MG tablet, Take 81 mg by mouth at bedtime. Swallow whole., Disp: , Rfl:    benzonatate  (TESSALON ) 200 MG capsule, Take 1 capsule by mouth three times daily as needed for cough, Disp: 120 capsule, Rfl: 0   clonazePAM  (KLONOPIN ) 1 MG tablet, TAKE 1 TABLET BY MOUTH IN THE MORNING AND 2 TABS AT BEDTIME, Disp: 90 tablet, Rfl: 0   Durvalumab  (IMFINZI  IV), Inject into the vein every 14 (fourteen) days., Disp: , Rfl:    ipratropium-albuterol  (DUONEB) 0.5-2.5 (3) MG/3ML SOLN, Inhale 3 mL 4 times a day by nebulization route., Disp: , Rfl:    lidocaine -prilocaine  (EMLA ) cream, Apply a quarter-sized amount to port a cath site and cover with plastic wrap 1 hour prior to infusion appointments, Disp: 30 g, Rfl: 3   loperamide   (IMODIUM ) 2 MG capsule, Take 1 capsule (2 mg total) by mouth as needed for diarrhea or loose stools (Take 2 capsules after the first loose stool and then 1 capsule after each loose stool bt do not exceed more than 8 capsules in a 24 hr period. If it is bedtime take 2 capsules at bedtime and then take 2 capsules every 4 hours until morning)., Disp: 30 capsule, Rfl: 1   meloxicam  (MOBIC ) 15 MG tablet, Take 1 tablet (15 mg total) by mouth in the morning., Disp: 30 tablet, Rfl: 2   pantoprazole (PROTONIX) 40 MG tablet, Take 40 mg by mouth daily., Disp: , Rfl:    prochlorperazine  (COMPAZINE ) 10 MG tablet, Take 10 mg by mouth every 6 (six) hours as needed., Disp: , Rfl:    simvastatin  (ZOCOR ) 20 MG tablet, Take 1 tablet (20 mg total) by mouth at bedtime., Disp: 90 tablet, Rfl: 3   sucralfate  (CARAFATE ) 1 g tablet, Take 1 tablet (1 g total) by mouth 4 (four) times daily. Dissolve each tablet in 15 cc water before use., Disp: 120 tablet, Rfl: 2   tamsulosin  (FLOMAX ) 0.4 MG CAPS capsule, Take 1 capsule (0.4 mg total) by mouth at bedtime., Disp: 90 capsule, Rfl: 3   TRELEGY ELLIPTA 100-62.5-25 MCG/ACT AEPB, Inhale 1 puff into the lungs daily., Disp: , Rfl:  No current facility-administered medications for this visit.  Facility-Administered Medications Ordered in Other Visits:    sodium chloride  flush (NS) 0.9 % injection 10 mL, 10 mL, Intravenous, PRN, Carlos Canela, MD, 10 mL at 08/08/23 1400   Allergies: Allergies  Allergen Reactions   Ciprofloxacin Swelling and Other (See Comments)    Swelling in hands, numbness in arms and joint pain   Atorvastatin     Other Reaction(s): Not available   Gabapentin  Other (See Comments)    Double vision  Other Reaction(s): Not  available  gabapentin     REVIEW OF SYSTEMS:   Review of Systems  Constitutional:  Negative for chills, fatigue and fever.  HENT:   Negative for lump/mass, mouth sores, nosebleeds, sore throat and trouble swallowing.   Eyes:   Negative for eye problems.  Respiratory:  Positive for cough and shortness of breath.   Cardiovascular:  Negative for chest pain, leg swelling and palpitations.  Gastrointestinal:  Positive for nausea and vomiting. Negative for abdominal pain, constipation and diarrhea.  Genitourinary:  Negative for bladder incontinence, difficulty urinating, dysuria, frequency, hematuria and nocturia.   Musculoskeletal:  Negative for arthralgias, back pain, flank pain, myalgias and neck pain.  Skin:  Negative for itching and rash.  Neurological:  Positive for dizziness. Negative for headaches and numbness.  Hematological:  Does not bruise/bleed easily.  Psychiatric/Behavioral:  Positive for sleep disturbance. Negative for depression and suicidal ideas. The patient is not nervous/anxious.   All other systems reviewed and are negative.    VITALS:   There were no vitals taken for this visit.  Wt Readings from Last 3 Encounters:  08/08/23 177 lb 11.1 oz (80.6 kg)  07/11/23 181 lb 10.5 oz (82.4 kg)  06/13/23 180 lb 3.2 oz (81.7 kg)    There is no height or weight on file to calculate BMI.  Performance status (ECOG): 1 - Symptomatic but completely ambulatory  PHYSICAL EXAM:   Physical Exam Vitals and nursing note reviewed. Exam conducted with a chaperone present.  Constitutional:      Appearance: Normal appearance.  Cardiovascular:     Rate and Rhythm: Normal rate and regular rhythm.     Pulses: Normal pulses.     Heart sounds: Normal heart sounds.  Pulmonary:     Effort: Pulmonary effort is normal.     Breath sounds: Normal breath sounds.  Abdominal:     Palpations: Abdomen is soft. There is no hepatomegaly, splenomegaly or mass.     Tenderness: There is abdominal tenderness in the epigastric area.     Comments: +no masses palpable  Musculoskeletal:     Right lower leg: No edema.     Left lower leg: No edema.  Lymphadenopathy:     Cervical: No cervical adenopathy.     Right cervical: No  superficial, deep or posterior cervical adenopathy.    Left cervical: No superficial, deep or posterior cervical adenopathy.     Upper Body:     Right upper body: No supraclavicular or axillary adenopathy.     Left upper body: No supraclavicular or axillary adenopathy.  Neurological:     General: No focal deficit present.     Mental Status: He is alert and oriented to person, place, and time.  Psychiatric:        Mood and Affect: Mood normal.        Behavior: Behavior normal.     LABS:      Latest Ref Rng & Units 08/08/2023   12:18 PM 07/11/2023   11:56 AM 06/13/2023   10:34 AM  CBC  WBC 4.0 - 10.5 K/uL 10.9  9.3  7.6   Hemoglobin 13.0 - 17.0 g/dL 83.9  85.3  86.0   Hematocrit 39.0 - 52.0 % 45.2  41.3  39.9   Platelets 150 - 400 K/uL 277  275  286       Latest Ref Rng & Units 08/08/2023   12:18 PM 07/11/2023   11:56 AM 06/13/2023   10:34 AM  CMP  Glucose 70 - 99  mg/dL 821  864  88   BUN 8 - 23 mg/dL 9  8  9    Creatinine 0.61 - 1.24 mg/dL 9.11  9.27  9.24   Sodium 135 - 145 mmol/L 133  129  133   Potassium 3.5 - 5.1 mmol/L 3.6  3.6  3.9   Chloride 98 - 111 mmol/L 96  97  103   CO2 22 - 32 mmol/L 23  20  22    Calcium 8.9 - 10.3 mg/dL 8.9  8.8  8.9   Total Protein 6.5 - 8.1 g/dL 7.1  7.1  6.8   Total Bilirubin 0.0 - 1.2 mg/dL 1.6  0.6  0.9   Alkaline Phos 38 - 126 U/L 78  63  60   AST 15 - 41 U/L 108  27  24   ALT 0 - 44 U/L 142  34  20      No results found for: CEA1, CEA / No results found for: CEA1, CEA Lab Results  Component Value Date   PSA1 4.7 (H) 04/12/2022   No results found for: CAN199 No results found for: CAN125  No results found for: TOTALPROTELP, ALBUMINELP, A1GS, A2GS, BETS, BETA2SER, GAMS, MSPIKE, SPEI No results found for: TIBC, FERRITIN, IRONPCTSAT No results found for: LDH   STUDIES:   CT CHEST W CONTRAST Result Date: 07/29/2023 CLINICAL DATA:  Non-small-cell lung cancer, monitor. * Tracking Code: BO * EXAM:  CT CHEST WITH CONTRAST TECHNIQUE: Multidetector CT imaging of the chest was performed during intravenous contrast administration. RADIATION DOSE REDUCTION: This exam was performed according to the departmental dose-optimization program which includes automated exposure control, adjustment of the mA and/or kV according to patient size and/or use of iterative reconstruction technique. CONTRAST:  80mL OMNIPAQUE  IOHEXOL  300 MG/ML  SOLN COMPARISON:  04/13/2023 FINDINGS: Cardiovascular: Left Port-A-Cath tip high right atrium. Aortic atherosclerosis. Tortuous thoracic aorta. Normal heart size, without pericardial effusion. Three vessel coronary artery calcification. No central pulmonary embolism, on this non-dedicated study. Mediastinum/Nodes: No mediastinal or hilar adenopathy. Lungs/Pleura: No pleural fluid.  Mild centrilobular emphysema. Right upper lobe pulmonary nodule measures 1.6 x 1.3 cm on 45/3 versus 1.7 x 1.3 cm on the prior exam (when remeasured). Right perihilar/inferior right upper lobe lung mass measures 4.3 x 2.9 cm on 63/3 versus 4.5 x 3.1 cm on the prior exam. Anterior right lower lobe volume loss and presumed atelectasis anteriorly are similar including on 88/3. Geographic ground-glass and interstitial thickening surrounding the dominant right upper lobe mass, especially posteriorly, are increased and likely radiation induced. The nodules along the right minor fissure are improved and resolved, including up to 3 mm on 59/3. 5 mm ground-glass left lower lobe nodule on 78/3 is unchanged. Scattered calcified granulomas. The tiny left upper lobe pulmonary nodule has resolved. 3 mm anteromedial right upper lobe pulmonary nodule on 71/3 is unchanged. 2-3 mm subpleural left lower lobe pulmonary nodule is similar on 122/3 and may represent a calcified granuloma. Upper Abdomen: Marked hepatic steatosis. Normal imaged portions of the spleen, stomach, pancreas, gallbladder, kidneys. Mild bilateral adrenal  thickening and mild left adrenal nodularity are unchanged. Musculoskeletal: Mild right-sided gynecomastia. No acute osseous abnormality. IMPRESSION: 1. Dominant inferior right upper lobe lung mass is similar to minimally decreased in size. Progressive surrounding interstitial thickening is likely radiation induced. 2. Right upper lobe pulmonary nodule is similar. 3. Equivocal smaller pulmonary nodules on the prior exam are improved and resolved, possibly response to therapy of metastasis or inflammatory etiology. 4. No thoracic  adenopathy. 5. Incidental findings, including: Aortic atherosclerosis (ICD10-I70.0), coronary artery atherosclerosis and emphysema (ICD10-J43.9). Hepatic steatosis Electronically Signed   By: Rockey Kilts M.D.   On: 07/29/2023 12:51

## 2023-08-09 DIAGNOSIS — I10 Essential (primary) hypertension: Secondary | ICD-10-CM | POA: Diagnosis not present

## 2023-08-09 DIAGNOSIS — E782 Mixed hyperlipidemia: Secondary | ICD-10-CM | POA: Diagnosis not present

## 2023-08-09 DIAGNOSIS — J449 Chronic obstructive pulmonary disease, unspecified: Secondary | ICD-10-CM | POA: Diagnosis not present

## 2023-08-14 ENCOUNTER — Ambulatory Visit (HOSPITAL_COMMUNITY)
Admission: RE | Admit: 2023-08-14 | Discharge: 2023-08-14 | Disposition: A | Discharge status: Home / Self Care | Source: Ambulatory Visit | Attending: Hematology | Admitting: Hematology

## 2023-08-14 DIAGNOSIS — R1011 Right upper quadrant pain: Secondary | ICD-10-CM | POA: Insufficient documentation

## 2023-08-14 DIAGNOSIS — R7989 Other specified abnormal findings of blood chemistry: Secondary | ICD-10-CM | POA: Diagnosis not present

## 2023-08-14 DIAGNOSIS — R932 Abnormal findings on diagnostic imaging of liver and biliary tract: Secondary | ICD-10-CM | POA: Diagnosis not present

## 2023-08-14 NOTE — Telephone Encounter (Signed)
 I have called and spoke with [facility] regarding appt for 8/7. RB was double booked at 11:30 and had several openings avail.  I have changed pts appt time to be sooner and pt is aware. Nfn

## 2023-08-15 ENCOUNTER — Ambulatory Visit: Admitting: Emergency Medicine

## 2023-08-15 ENCOUNTER — Ambulatory Visit (INDEPENDENT_AMBULATORY_CARE_PROVIDER_SITE_OTHER): Admitting: Emergency Medicine

## 2023-08-15 ENCOUNTER — Other Ambulatory Visit: Payer: Self-pay

## 2023-08-15 ENCOUNTER — Encounter: Payer: Self-pay | Admitting: Emergency Medicine

## 2023-08-15 ENCOUNTER — Inpatient Hospital Stay (HOSPITAL_BASED_OUTPATIENT_CLINIC_OR_DEPARTMENT_OTHER): Admitting: Hematology

## 2023-08-15 ENCOUNTER — Inpatient Hospital Stay: Attending: Hematology

## 2023-08-15 ENCOUNTER — Inpatient Hospital Stay

## 2023-08-15 VITALS — BP 127/82 | HR 122 | Temp 98.0°F | Resp 20

## 2023-08-15 VITALS — HR 121

## 2023-08-15 VITALS — BP 117/77 | HR 98 | Temp 98.7°F | Ht 66.0 in | Wt 177.4 lb

## 2023-08-15 VITALS — BP 137/71 | HR 110 | Temp 98.1°F | Resp 19

## 2023-08-15 DIAGNOSIS — D3502 Benign neoplasm of left adrenal gland: Secondary | ICD-10-CM | POA: Diagnosis not present

## 2023-08-15 DIAGNOSIS — F32A Depression, unspecified: Secondary | ICD-10-CM | POA: Insufficient documentation

## 2023-08-15 DIAGNOSIS — Z5112 Encounter for antineoplastic immunotherapy: Secondary | ICD-10-CM | POA: Diagnosis not present

## 2023-08-15 DIAGNOSIS — C3491 Malignant neoplasm of unspecified part of right bronchus or lung: Secondary | ICD-10-CM

## 2023-08-15 DIAGNOSIS — Z923 Personal history of irradiation: Secondary | ICD-10-CM | POA: Diagnosis not present

## 2023-08-15 DIAGNOSIS — J449 Chronic obstructive pulmonary disease, unspecified: Secondary | ICD-10-CM | POA: Diagnosis not present

## 2023-08-15 DIAGNOSIS — Z95828 Presence of other vascular implants and grafts: Secondary | ICD-10-CM | POA: Diagnosis not present

## 2023-08-15 DIAGNOSIS — F17210 Nicotine dependence, cigarettes, uncomplicated: Secondary | ICD-10-CM | POA: Insufficient documentation

## 2023-08-15 DIAGNOSIS — C3411 Malignant neoplasm of upper lobe, right bronchus or lung: Secondary | ICD-10-CM | POA: Insufficient documentation

## 2023-08-15 DIAGNOSIS — Z79899 Other long term (current) drug therapy: Secondary | ICD-10-CM | POA: Insufficient documentation

## 2023-08-15 DIAGNOSIS — J432 Centrilobular emphysema: Secondary | ICD-10-CM | POA: Diagnosis not present

## 2023-08-15 DIAGNOSIS — Z720 Tobacco use: Secondary | ICD-10-CM | POA: Insufficient documentation

## 2023-08-15 DIAGNOSIS — F419 Anxiety disorder, unspecified: Secondary | ICD-10-CM | POA: Diagnosis not present

## 2023-08-15 LAB — CBC WITH DIFFERENTIAL/PLATELET
Abs Immature Granulocytes: 0.04 K/uL (ref 0.00–0.07)
Basophils Absolute: 0.1 K/uL (ref 0.0–0.1)
Basophils Relative: 1 %
Eosinophils Absolute: 0.2 K/uL (ref 0.0–0.5)
Eosinophils Relative: 2 %
HCT: 44.8 % (ref 39.0–52.0)
Hemoglobin: 15.6 g/dL (ref 13.0–17.0)
Immature Granulocytes: 0 %
Lymphocytes Relative: 5 %
Lymphs Abs: 0.4 K/uL — ABNORMAL LOW (ref 0.7–4.0)
MCH: 32.4 pg (ref 26.0–34.0)
MCHC: 34.8 g/dL (ref 30.0–36.0)
MCV: 93.1 fL (ref 80.0–100.0)
Monocytes Absolute: 0.6 K/uL (ref 0.1–1.0)
Monocytes Relative: 7 %
Neutro Abs: 7.7 K/uL (ref 1.7–7.7)
Neutrophils Relative %: 85 %
Platelets: 329 K/uL (ref 150–400)
RBC: 4.81 MIL/uL (ref 4.22–5.81)
RDW: 15.4 % (ref 11.5–15.5)
WBC: 9 K/uL (ref 4.0–10.5)
nRBC: 0 % (ref 0.0–0.2)

## 2023-08-15 LAB — COMPREHENSIVE METABOLIC PANEL WITH GFR
ALT: 83 U/L — ABNORMAL HIGH (ref 0–44)
AST: 69 U/L — ABNORMAL HIGH (ref 15–41)
Albumin: 3.7 g/dL (ref 3.5–5.0)
Alkaline Phosphatase: 80 U/L (ref 38–126)
Anion gap: 15 (ref 5–15)
BUN: 8 mg/dL (ref 8–23)
CO2: 22 mmol/L (ref 22–32)
Calcium: 8.8 mg/dL — ABNORMAL LOW (ref 8.9–10.3)
Chloride: 99 mmol/L (ref 98–111)
Creatinine, Ser: 0.78 mg/dL (ref 0.61–1.24)
GFR, Estimated: >60 mL/min (ref >60)
Glucose, Bld: 186 mg/dL — ABNORMAL HIGH (ref 70–99)
Potassium: 3.9 mmol/L (ref 3.5–5.1)
Sodium: 136 mmol/L (ref 135–145)
Total Bilirubin: 0.8 mg/dL (ref 0.0–1.2)
Total Protein: 7.2 g/dL (ref 6.5–8.1)

## 2023-08-15 LAB — TSH: TSH: 0.839 u[IU]/mL (ref 0.350–4.500)

## 2023-08-15 LAB — MAGNESIUM: Magnesium: 1.8 mg/dL (ref 1.7–2.4)

## 2023-08-15 MED ORDER — SODIUM CHLORIDE 0.9 % IV SOLN
1500.0000 mg | Freq: Once | INTRAVENOUS | Status: AC
  Administered 2023-08-15: 1500 mg via INTRAVENOUS
  Filled 2023-08-15: qty 30

## 2023-08-15 MED ORDER — BREZTRI AEROSPHERE 160-9-4.8 MCG/ACT IN AERO: INHALATION_SPRAY | RESPIRATORY_TRACT | Status: DC

## 2023-08-15 MED ORDER — SODIUM CHLORIDE 0.9 % IV SOLN
Freq: Once | INTRAVENOUS | Status: AC
Start: 2023-08-15 — End: 2023-08-15

## 2023-08-15 NOTE — Patient Instructions (Signed)
 [facility] [place] [facility]  Discharge Instructions: Thank you for choosing [facility] to provide your oncology and hematology care.  If you have a lab appointment with the Cancer Center - please note that after April 8th, 2024, all labs will be drawn in the cancer center.  You do not have to check in or register with the main entrance as you have in the past but will complete your check-in in the cancer center.  Wear comfortable clothing and clothing appropriate for easy access to any Portacath or PICC line.   We strive to give you quality time with your provider. You may need to reschedule your appointment if you arrive late (15 or more minutes).  Arriving late affects you and other patients whose appointments are after yours.  Also, if you miss three or more appointments without notifying the office, you may be dismissed from the clinic at the provider's discretion.      For prescription refill requests, have your pharmacy contact our office and allow 72 hours for refills to be completed.    Today you received the following chemotherapy and/or immunotherapy agents Imfinzi   To help prevent nausea and vomiting after your treatment, we encourage you to take your nausea medication as directed.  Durvalumab Injection What is this medication? DURVALUMAB (dur VAL ue mab) treats some types of cancer. It works by helping your immune system slow or stop the spread of cancer cells. It is a monoclonal antibody. This medicine may be used for other purposes; ask your health care provider or pharmacist if you have questions. COMMON BRAND NAME(S): IMFINZI What should I tell my care team before I take this medication? They need to know if you have any of these conditions: Allogeneic stem cell transplant (uses someone else's stem cells) Autoimmune diseases, such as Crohn disease, ulcerative colitis, lupus History of chest radiation Nervous system  problems, such as Guillain-Barre syndrome, myasthenia gravis Organ transplant An unusual or allergic reaction to durvalumab, other medications, foods, dyes, or preservatives Pregnant or trying to get pregnant Breast-feeding How should I use this medication? This medication is infused into a vein. It is given by your care team in a hospital or clinic setting. A special MedGuide will be given to you before each treatment. Be sure to read this information carefully each time. Talk to your care team about the use of this medication in children. Special care may be needed. Overdosage: If you think you have taken too much of this medicine contact a poison control center or emergency room at once. NOTE: This medicine is only for you. Do not share this medicine with others. What if I miss a dose? Keep appointments for follow-up doses. It is important not to miss your dose. Call your care team if you are unable to keep an appointment. What may interact with this medication? Interactions have not been studied. This list may not describe all possible interactions. Give your health care provider a list of all the medicines, herbs, non-prescription drugs, or dietary supplements you use. Also tell them if you smoke, drink alcohol, or use illegal drugs. Some items may interact with your medicine. What should I watch for while using this medication? Your condition will be monitored carefully while you are receiving this medication. You may need blood work while taking this medication. This medication may cause serious skin reactions. They can happen weeks to months after starting the medication. Contact  your care team right away if you notice fevers or flu-like symptoms with a rash. The rash may be red or purple and then turn into blisters or peeling of the skin. You may also notice a red rash with swelling of the face, lips, or lymph nodes in your neck or under your arms. Tell your care team right away if you  have any change in your eyesight. Talk to your care team if you may be pregnant. Serious birth defects can occur if you take this medication during pregnancy and for 3 months after the last dose. You will need a negative pregnancy test before starting this medication. Contraception is recommended while taking this medication and for 3 months after the last dose. Your care team can help you find the option that works for you. Do not breastfeed while taking this medication and for 3 months after the last dose. What side effects may I notice from receiving this medication? Side effects that you should report to your care team as soon as possible: Allergic reactions--skin rash, itching, hives, swelling of the face, lips, tongue, or throat Dry cough, shortness of breath or trouble breathing Eye pain, redness, irritation, or discharge with blurry or decreased vision Heart muscle inflammation--unusual weakness or fatigue, shortness of breath, chest pain, fast or irregular heartbeat, dizziness, swelling of the ankles, feet, or hands Hormone gland problems--headache, sensitivity to light, unusual weakness or fatigue, dizziness, fast or irregular heartbeat, increased sensitivity to cold or heat, excessive sweating, constipation, hair loss, increased thirst or amount of urine, tremors or shaking, irritability Infusion reactions--chest pain, shortness of breath or trouble breathing, feeling faint or lightheaded Kidney injury (glomerulonephritis)--decrease in the amount of urine, red or dark brown urine, foamy or bubbly urine, swelling of the ankles, hands, or feet Liver injury--right upper belly pain, loss of appetite, nausea, light-colored stool, dark yellow or brown urine, yellowing skin or eyes, unusual weakness or fatigue Pain, tingling, or numbness in the hands or feet, muscle weakness, change in vision, confusion or trouble speaking, loss of balance or coordination, trouble walking, seizures Rash, fever, and  swollen lymph nodes Redness, blistering, peeling, or loosening of the skin, including inside the mouth Sudden or severe stomach pain, bloody diarrhea, fever, nausea, vomiting Side effects that usually do not require medical attention (report these to your care team if they continue or are bothersome): Bone, joint, or muscle pain Diarrhea Fatigue Loss of appetite Nausea Skin rash This list may not describe all possible side effects. Call your doctor for medical advice about side effects. You may report side effects to FDA at 1-800-FDA-1088. Where should I keep my medication? This medication is given in a hospital or clinic. It will not be stored at home. NOTE: This sheet is a summary. It may not cover all possible information. If you have questions about this medicine, talk to your doctor, pharmacist, or health care provider.  2024 Elsevier/Gold Standard (2021-05-09 00:00:00)   BELOW ARE SYMPTOMS THAT SHOULD BE REPORTED IMMEDIATELY: *FEVER GREATER THAN 100.4 F (38 C) OR HIGHER *CHILLS OR SWEATING *NAUSEA AND VOMITING THAT IS NOT CONTROLLED WITH YOUR NAUSEA MEDICATION *UNUSUAL SHORTNESS OF BREATH *UNUSUAL BRUISING OR BLEEDING *URINARY PROBLEMS (pain or burning when urinating, or frequent urination) *BOWEL PROBLEMS (unusual diarrhea, constipation, pain near the anus) TENDERNESS IN MOUTH AND THROAT WITH OR WITHOUT PRESENCE OF ULCERS (sore throat, sores in mouth, or a toothache) UNUSUAL RASH, SWELLING OR PAIN  UNUSUAL VAGINAL DISCHARGE OR ITCHING   Items with * indicate a  potential emergency and should be followed up as soon as possible or go to the Emergency Department if any problems should occur.  Please show the CHEMOTHERAPY ALERT CARD or IMMUNOTHERAPY ALERT CARD at check-in to the Emergency Department and triage nurse.  Should you have questions after your visit or need to cancel or reschedule your appointment, please contact [facility] [place] - A DEPT OF Eligha Bridegroom [facility] [phone]  and follow the prompts.  Office hours are 8:00 a.m. to 4:30 p.m. Monday - Friday. Please note that voicemails left after 4:00 p.m. may not be returned until the following business day.  We are closed weekends and major holidays. You have access to a nurse at all times for urgent questions. Please call the main number to the clinic [phone] and follow the prompts.  For any non-urgent questions, you may also contact your provider using MyChart. We now offer e-Visits for anyone 52 and older to request care online for non-urgent symptoms. For details visit [email or web address].   Also download the MyChart app! Go to the app store, search "MyChart", open the app, select [place], and log in with your MyChart username and password.

## 2023-08-15 NOTE — Progress Notes (Signed)
 [facility] [address] [place] [address]    Clinic Day:  08/15/23   Referring physician: Shona Norleen PEDLAR, MD  Patient Care Team: Shona Norleen PEDLAR, MD as PCP - General (Internal Medicine) Rogers Hai, MD as Medical Oncologist (Medical Oncology) Celestia Joesph SQUIBB, RN as Oncology Nurse Navigator (Medical Oncology)   ASSESSMENT & PLAN:   Assessment: 1.  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  No hemoptysis/weight loss. - Chest x-ray (04/10/2022): Mid right lung mass. - CT chest (04/13/2022): 9.2 x 5.7 x 6.6 cm right upper lobe mass with lobulations appearing to extend across the minor fissure into the right middle lobe and right lower lobe.  Satellite lesion in the right upper lobe measures 1.6 x 1.4 x 1.4 cm.  No findings of chest wall invasion.  Borderline prominent lower right paratracheal lymph node 0.9 cm.  Old granulomatous disease.  Small left adrenal adenoma. - Has right posterior chest wall pain for the past 6 months, dull type.  He is not requiring any pain medication. - Brain MRI (04/19/2022): No metastatic disease. - PET scan (04/19/2022): 9 x 5.7 cm right upper lobe mass with SUV 11.8.  1.3 x 0.9 cm satellite nodule in the right upper lobe with SUV 3.8.  No adenopathy or distant metastatic disease. - Bronchoscopy and biopsy by Dr. Shelah - Pathology (04/23/2022): Both right upper lobe lung nodule FNA consistent with poorly differentiated carcinoma.  IHC diffusely positive for TTF-1, Napsin, synaptophysin and CK7.  CD56 negative.  P63 and CK5/6 negative.  Given Napsin positivity, favor adenocarcinoma.  There is also diffuse synaptophysin positivity consistent with neuroendocrine differentiation.  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 - Discussed with Dr. Kerrin.  Not a surgical candidate as he requires pneumonectomy and has  suboptimal DLCO. - Chemoradiation with carboplatin  and paclitaxel  from 05/22/2022 through 07/06/2022 - Consolidation durvalumab  from 07/27/2022 through 08/15/2023   2.  Social/family history: - He is seen with his 2 sisters today.  Lives by himself and is independent of ADLs and IADLs.  He retired after working at [employer] for the last 34 years.  No asbestos exposure.  No other chemicals.  Smokes more than 1 pack/day for the last 50 years (started at age 39) quit smoking for 5 years before starting back again. - Brother died of lung cancer.  Father had bladder cancer.  Maternal uncle had cancer.    Plan: 1.  Stage IIIa adenocarcinoma of the right lung: - At last week, we have held his last dose of durvalumab  due to elevated LFTs.  He also developed epigastric pain.  I have started him on Pepcid .  Pain has resolved. - LFTs today improved to 69 and 83 respectively. - We reviewed ultrasound of the abdomen: No problems with the gallbladder.  Fatty liver was seen. - Recommend that he proceed with last dose of durvalumab . - RTC 4 months for follow-up with repeat CT chest with contrast.  If there is no evidence of progression/metastatic disease, we will switch his CT chest scans to every 6 months.    2.  Anxiety/depression: - He will continue clonazepam  1 mg twice daily which is helping.   3.  COPD: - Continue Tessalon  Perles as needed.    Orders Placed This Encounter  Procedures   CT CHEST W CONTRAST    Standing Status:  Future    Expected Date:   12/15/2023    Expiration Date:   03/14/2024    If indicated for the ordered procedure, I authorize the administration of contrast media per Radiology protocol:   Yes    Does the patient have a contrast media/X-ray dye allergy?:   No    Preferred imaging location?:   [facility]    Release to patient:   Immediate [1]    Release to patient:   Immediate   Magnesium     Standing Status:   Future    Expected Date:   08/15/2023     Expiration Date:   08/14/2024   CBC with Differential    Standing Status:   Future    Expected Date:   08/15/2023    Expiration Date:   08/14/2024   Comprehensive metabolic panel    Standing Status:   Future    Expected Date:   08/15/2023    Expiration Date:   08/14/2024   T4    Standing Status:   Future    Expected Date:   08/15/2023    Expiration Date:   08/14/2024   TSH    Standing Status:   Future    Expected Date:   08/15/2023    Expiration Date:   08/14/2024   Comprehensive metabolic panel with GFR    Standing Status:   Future    Expected Date:   12/15/2023    Expiration Date:   03/14/2024    Release to patient:   Immediate   CBC with Differential/Platelet    Standing Status:   Future    Expected Date:   12/15/2023    Expiration Date:   03/14/2024    Release to patient:   Immediate   TSH    Standing Status:   Future    Expected Date:   12/15/2023    Expiration Date:   03/14/2024    Release to patient:   Immediate      I,Helena R Teague,acting as a scribe for [name], MD.,have documented all relevant documentation on the behalf of [name], MD,as directed by  [name], MD while in the presence of [name], MD.  I, [name] MD, have reviewed the above documentation for accuracy and completeness, and I agree with the above.     [name], MD   8/7/20252:01 PM  CHIEF COMPLAINT:   Diagnosis: RUL lung adenocarcinoma    Cancer Staging  Non-small cell cancer of right lung (HCC) Staging form: Lung, AJCC 8th Edition - Clinical stage from 05/01/2022: Stage IIIA (cT4, cN0, cM0) - Unsigned    Prior Therapy: none  Current Therapy:  concurrent chemoradiation with carboplatin  + paclitaxel     HISTORY OF PRESENT ILLNESS:   Oncology History  Non-small cell cancer of right lung (HCC)  05/01/2022 Initial Diagnosis   Non-small cell cancer of right lung (HCC)   05/22/2022 - 07/05/2022 Chemotherapy   Patient is on Treatment Plan :  LUNG Carboplatin  + Paclitaxel  + XRT q7d     07/27/2022 -  Chemotherapy   Patient is on Treatment Plan : LUNG Durvalumab  q 28 day (beginning 11/22/22)        INTERVAL HISTORY:   Carlos [name] is a 68 y.o. male presenting to clinic today for follow up of RUL lung adenocarcinoma. He was last seen by me on 08/08/2023.  Since his last visit, he underwent US  of the RUQ on 08/14/2023 that found: Heterogeneous with coarsened hepatic echotexture and increased  parenchymal echogenicity, which is nonspecific but can be seen in the setting of hepatic steatosis and/or hepatocellular disease.  Today, he states that he is doing well overall. His appetite level is at 100%. His energy level is at 75%. Ruvim is accompanied by family members. He states his epigastric pain has improved. He began taking Pepcid  twice daily after his last visit with me until pain improved and now takes the medication once daily. He denies any acid reflux.   PAST MEDICAL HISTORY:   Past Medical History: Past Medical History:  Diagnosis Date   Anxiety    Cancer (HCC)    Skin cancer left shoulder   Chronic low back pain    Complication of anesthesia    Dermatofibrosarcoma protubera of right shoulder    Dyspnea    GERD (gastroesophageal reflux disease)    Hyperlipidemia    Lung cancer (HCC) 04/23/2022   PONV (postoperative nausea and vomiting)    Port-A-Cath in place 05/17/2022   Prostate cancer [facility])     Surgical History: Past Surgical History:  Procedure Laterality Date   APPENDECTOMY     APPLICATION OF A-CELL OF EXTREMITY Right 07/09/2012   Procedure: PLACEMENT OF A-CELL TO UPPER EXTREMITY/PLACEMENT OF VAC;  Surgeon: Estefana Reichert, DO;  Location: [facility];  Service: Plastics;  Laterality: Right;   BRONCHIAL BRUSHINGS  04/23/2022   Procedure: BRONCHIAL BRUSHINGS;  Surgeon: [name], MD;  Location: [facility] ENDOSCOPY;  Service: Pulmonary;;   BRONCHIAL NEEDLE ASPIRATION BIOPSY  04/23/2022   Procedure:  BRONCHIAL NEEDLE ASPIRATION BIOPSIES;  Surgeon: [name], MD;  Location: [facility] ENDOSCOPY;  Service: Pulmonary;;   COLONOSCOPY WITH PROPOFOL  N/A 09/30/2015   Procedure: COLONOSCOPY WITH PROPOFOL ;  Surgeon: Claudis RAYMOND Rivet, MD;  Location: [facility];  Service: Endoscopy;  Laterality: N/A;  830   EXCISION DERMATOFIBROCARCOMA PROTUBERAN RIGHT SHOULDER  2 - 3 WKS AGO   HEMOSTASIS CONTROL  04/23/2022   Procedure: HEMOSTASIS CONTROL;  Surgeon: [name], MD;  Location: [facility] ENDOSCOPY;  Service: Pulmonary;;   INGUINAL HERNIA REPAIR Left 1999   IR IMAGING GUIDED PORT INSERTION  04/30/2022   KNEE ARTHROSCOPY Left 2012   POLYPECTOMY  09/30/2015   Procedure: POLYPECTOMY;  Surgeon: Claudis RAYMOND Rivet, MD;  Location: [facility];  Service: Endoscopy;;  sigmoid   VIDEO BRONCHOSCOPY WITH ENDOBRONCHIAL ULTRASOUND Bilateral 04/23/2022   Procedure: VIDEO BRONCHOSCOPY WITH ENDOBRONCHIAL ULTRASOUND;  Surgeon: [name], MD;  Location: [facility] ENDOSCOPY;  Service: Pulmonary;  Laterality: Bilateral;   VIDEO BRONCHOSCOPY WITH RADIAL ENDOBRONCHIAL ULTRASOUND  04/23/2022   Procedure: VIDEO BRONCHOSCOPY WITH RADIAL ENDOBRONCHIAL ULTRASOUND;  Surgeon: [name], MD;  Location: [facility] ENDOSCOPY;  Service: Pulmonary;;    Social History: Social History   Socioeconomic History   Marital status: Divorced    Spouse name: Apolinar   Number of children: 1   Years of education: 10th   Highest education level: Not on file  Occupational History    Employer: [employer]    Comment: [employer]  Tobacco Use   Smoking status: Every Day    Current packs/day: 1.00    Average packs/day: 1 pack/day for 40.0 years (40.0 ttl pk-yrs)    Types: Cigarettes   Smokeless tobacco: Never  Vaping Use   Vaping status: Never Used  Substance and Sexual Activity   Alcohol use: Yes    Comment: 2-3 cans beer/day   Drug use: No   Sexual activity: Not on file  Other Topics Concern  Not on file  Social History  Narrative   Patient lives at home with his dog. 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 Patient has 10 grade education.Patient is right-handed.Caffeine Use: 1 cup of coffee; 3-4 sodas daily   Social Drivers of Health   Financial Resource Strain: High Risk (05/10/2022)   Overall Financial Resource Strain (CARDIA)    Difficulty of Paying Living Expenses: Very hard  Food Insecurity: Food Insecurity Present (05/04/2022)   Hunger Vital Sign    Worried About Running Out of Food in the Last Year: Sometimes true    Ran Out of Food in the Last Year: Sometimes true  Transportation Needs: No Transportation Needs (05/04/2022)   PRAPARE - Administrator, Civil Service (Medical): No    Lack of Transportation (Non-Medical): No  Physical Activity: Not on file  Stress: Not on file  Social Connections: Not on file  Intimate Partner Violence: Not At Risk (05/04/2022)   Humiliation, Afraid, Rape, and Kick questionnaire    Fear of Current or Ex-Partner: No    Emotionally Abused: No    Physically Abused: No    Sexually Abused: No    Family History: Family History  Problem Relation Age of Onset   Dementia Mother    Cancer Father     Current Medications:  Current Outpatient Medications:    acetaminophen  (TYLENOL ) 500 MG tablet, Take 1,000 mg by mouth every 6 (six) hours as needed (pain.)., Disp: , Rfl:    albuterol  (VENTOLIN  HFA) 108 (90 Base) MCG/ACT inhaler, Inhale 2 puffs into the lungs every 6 (six) hours as needed for wheezing or shortness of breath., Disp: 8 g, Rfl: 6   aspirin EC 81 MG tablet, Take 81 mg by mouth at bedtime. Swallow whole., Disp: , Rfl:    benzonatate  (TESSALON ) 200 MG capsule, Take 1 capsule by mouth three times daily as needed for cough, Disp: 120 capsule, Rfl: 0   budesonide-glycopyrrolate-formoterol (BREZTRI  AEROSPHERE) 160-9-4.8 MCG/ACT AERO inhaler, X2 samples given, Disp: , Rfl:    clonazePAM  (KLONOPIN ) 1 MG tablet, TAKE 1 TABLET  BY MOUTH IN THE MORNING AND 2 TABS AT BEDTIME, Disp: 90 tablet, Rfl: 0   Durvalumab  (IMFINZI  IV), Inject into the vein every 14 (fourteen) days., Disp: , Rfl:    ipratropium-albuterol  (DUONEB) 0.5-2.5 (3) MG/3ML SOLN, Inhale 3 mL 4 times a day by nebulization route., Disp: , Rfl:    lidocaine -prilocaine  (EMLA ) cream, Apply a quarter-sized amount to port a cath site and cover with plastic wrap 1 hour prior to infusion appointments, Disp: 30 g, Rfl: 3   loperamide  (IMODIUM ) 2 MG capsule, Take 1 capsule (2 mg total) by mouth as needed for diarrhea or loose stools (Take 2 capsules after the first loose stool and then 1 capsule after each loose stool bt do not exceed more than 8 capsules in a 24 hr period. If it is bedtime take 2 capsules at bedtime and then take 2 capsules every 4 hours until morning)., Disp: 30 capsule, Rfl: 1   meloxicam  (MOBIC ) 15 MG tablet, Take 1 tablet (15 mg total) by mouth in the morning., Disp: 30 tablet, Rfl: 2   pantoprazole (PROTONIX) 40 MG tablet, Take 40 mg by mouth daily., Disp: , Rfl:    prochlorperazine  (COMPAZINE ) 10 MG tablet, Take 10 mg by mouth every 6 (six) hours as needed., Disp: , Rfl:    simvastatin  (ZOCOR ) 20 MG tablet, Take 1 tablet (20 mg total) by mouth at bedtime.,  Disp: 90 tablet, Rfl: 3   sucralfate  (CARAFATE ) 1 g tablet, Take 1 tablet (1 g total) by mouth 4 (four) times daily. Dissolve each tablet in 15 cc water before use., Disp: 120 tablet, Rfl: 2   tamsulosin  (FLOMAX ) 0.4 MG CAPS capsule, Take 1 capsule (0.4 mg total) by mouth at bedtime., Disp: 90 capsule, Rfl: 3   TRELEGY ELLIPTA 100-62.5-25 MCG/ACT AEPB, Inhale 1 puff into the lungs daily. (Patient not taking: Reported on 08/15/2023), Disp: , Rfl:    Allergies: Allergies  Allergen Reactions   Ciprofloxacin Swelling and Other (See Comments)    Swelling in hands, numbness in arms and joint pain   Atorvastatin     Other Reaction(s): Not available   Gabapentin  Other (See Comments)    Double  vision  Other Reaction(s): Not available  gabapentin     REVIEW OF SYSTEMS:   Review of Systems  Constitutional:  Negative for chills, fatigue and fever.  HENT:   Negative for lump/mass, mouth sores, nosebleeds, sore throat and trouble swallowing.   Eyes:  Negative for eye problems.  Respiratory:  Positive for shortness of breath (with exertion). Negative for cough.   Cardiovascular:  Negative for chest pain, leg swelling and palpitations.  Gastrointestinal:  Negative for abdominal pain, constipation, diarrhea, nausea and vomiting.  Genitourinary:  Negative for bladder incontinence, difficulty urinating, dysuria, frequency, hematuria and nocturia.   Musculoskeletal:  Negative for arthralgias, back pain, flank pain, myalgias and neck pain.  Skin:  Negative for itching and rash.  Neurological:  Negative for dizziness, headaches and numbness.  Hematological:  Does not bruise/bleed easily.  Psychiatric/Behavioral:  Negative for depression, sleep disturbance and suicidal ideas. The patient is not nervous/anxious.   All other systems reviewed and are negative.    VITALS:   Pulse (!) 121.  Wt Readings from Last 3 Encounters:  08/15/23 177 lb 6.4 oz (80.5 kg)  08/08/23 177 lb 11.1 oz (80.6 kg)  07/11/23 181 lb 10.5 oz (82.4 kg)    There is no height or weight on file to calculate BMI.  Performance status (ECOG): 1 - Symptomatic but completely ambulatory  PHYSICAL EXAM:   Physical Exam Vitals and nursing note reviewed. Exam conducted with a chaperone present.  Constitutional:      Appearance: Normal appearance.  Cardiovascular:     Rate and Rhythm: Normal rate and regular rhythm.     Pulses: Normal pulses.     Heart sounds: Normal heart sounds.  Pulmonary:     Effort: Pulmonary effort is normal.     Breath sounds: Normal breath sounds.  Abdominal:     Palpations: Abdomen is soft. There is no hepatomegaly, splenomegaly or mass.     Tenderness: There is no abdominal  tenderness.  Musculoskeletal:     Right lower leg: No edema.     Left lower leg: No edema.  Lymphadenopathy:     Cervical: No cervical adenopathy.     Right cervical: No superficial, deep or posterior cervical adenopathy.    Left cervical: No superficial, deep or posterior cervical adenopathy.     Upper Body:     Right upper body: No supraclavicular or axillary adenopathy.     Left upper body: No supraclavicular or axillary adenopathy.  Neurological:     General: No focal deficit present.     Mental Status: He is alert and oriented to person, place, and time.  Psychiatric:        Mood and Affect: Mood normal.  Behavior: Behavior normal.     LABS:      Latest Ref Rng & Units 08/15/2023   12:44 PM 08/08/2023   12:18 PM 07/11/2023   11:56 AM  CBC  WBC 4.0 - 10.5 K/uL 9.0  10.9  9.3   Hemoglobin 13.0 - 17.0 g/dL 84.3  83.9  85.3   Hematocrit 39.0 - 52.0 % 44.8  45.2  41.3   Platelets 150 - 400 K/uL 329  277  275       Latest Ref Rng & Units 08/15/2023   12:44 PM 08/08/2023   12:18 PM 07/11/2023   11:56 AM  CMP  Glucose 70 - 99 mg/dL 813  821  864   BUN 8 - 23 mg/dL 8  9  8    Creatinine 0.61 - 1.24 mg/dL 9.21  9.11  9.27   Sodium 135 - 145 mmol/L 136  133  129   Potassium 3.5 - 5.1 mmol/L 3.9  3.6  3.6   Chloride 98 - 111 mmol/L 99  96  97   CO2 22 - 32 mmol/L 22  23  20    Calcium 8.9 - 10.3 mg/dL 8.8  8.9  8.8   Total Protein 6.5 - 8.1 g/dL 7.2  7.1  7.1   Total Bilirubin 0.0 - 1.2 mg/dL 0.8  1.6  0.6   Alkaline Phos 38 - 126 U/L 80  78  63   AST 15 - 41 U/L 69  108  27   ALT 0 - 44 U/L 83  142  34      No results found for: CEA1, CEA / No results found for: CEA1, CEA Lab Results  Component Value Date   PSA1 4.7 (H) 04/12/2022   No results found for: CAN199 No results found for: CAN125  No results found for: TOTALPROTELP, ALBUMINELP, A1GS, A2GS, BETS, BETA2SER, GAMS, MSPIKE, SPEI No results found for: TIBC, FERRITIN,  IRONPCTSAT No results found for: LDH   STUDIES:   US  Abdomen Limited RUQ (LIVER/GB) Result Date: 08/15/2023 CLINICAL DATA:  Elevated LFTs EXAM: ULTRASOUND ABDOMEN LIMITED RIGHT UPPER QUADRANT COMPARISON:  CT 07/19/2021 FINDINGS: Gallbladder: No gallstones or wall thickening visualized. No sonographic Murphy sign noted by sonographer. Common bile duct: Diameter: 5 mm Liver: Heterogeneous with coarsened hepatic echotexture and increased parenchymal echogenicity. Portal vein is patent on color Doppler imaging with normal direction of blood flow towards the liver. Other: None. IMPRESSION: Heterogeneous with coarsened hepatic echotexture and increased parenchymal echogenicity, which is nonspecific but can be seen in the setting of hepatic steatosis and/or hepatocellular disease. Electronically Signed   By: Reyes Holder M.D.   On: 08/15/2023 13:37   CT CHEST W CONTRAST Result Date: 07/29/2023 CLINICAL DATA:  Non-small-cell lung cancer, monitor. * Tracking Code: BO * EXAM: CT CHEST WITH CONTRAST TECHNIQUE: Multidetector CT imaging of the chest was performed during intravenous contrast administration. RADIATION DOSE REDUCTION: This exam was performed according to the departmental dose-optimization program which includes automated exposure control, adjustment of the mA and/or kV according to patient size and/or use of iterative reconstruction technique. CONTRAST:  80mL OMNIPAQUE  IOHEXOL  300 MG/ML  SOLN COMPARISON:  04/13/2023 FINDINGS: Cardiovascular: Left Port-A-Cath tip high right atrium. Aortic atherosclerosis. Tortuous thoracic aorta. Normal heart size, without pericardial effusion. Three vessel coronary artery calcification. No central pulmonary embolism, on this non-dedicated study. Mediastinum/Nodes: No mediastinal or hilar adenopathy. Lungs/Pleura: No pleural fluid.  Mild centrilobular emphysema. Right upper lobe pulmonary nodule measures 1.6 x 1.3 cm on 45/3 versus  1.7 x 1.3 cm on the prior exam (when  remeasured). Right perihilar/inferior right upper lobe lung mass measures 4.3 x 2.9 cm on 63/3 versus 4.5 x 3.1 cm on the prior exam. Anterior right lower lobe volume loss and presumed atelectasis anteriorly are similar including on 88/3. Geographic ground-glass and interstitial thickening surrounding the dominant right upper lobe mass, especially posteriorly, are increased and likely radiation induced. The nodules along the right minor fissure are improved and resolved, including up to 3 mm on 59/3. 5 mm ground-glass left lower lobe nodule on 78/3 is unchanged. Scattered calcified granulomas. The tiny left upper lobe pulmonary nodule has resolved. 3 mm anteromedial right upper lobe pulmonary nodule on 71/3 is unchanged. 2-3 mm subpleural left lower lobe pulmonary nodule is similar on 122/3 and may represent a calcified granuloma. Upper Abdomen: Marked hepatic steatosis. Normal imaged portions of the spleen, stomach, pancreas, gallbladder, kidneys. Mild bilateral adrenal thickening and mild left adrenal nodularity are unchanged. Musculoskeletal: Mild right-sided gynecomastia. No acute osseous abnormality. IMPRESSION: 1. Dominant inferior right upper lobe lung mass is similar to minimally decreased in size. Progressive surrounding interstitial thickening is likely radiation induced. 2. Right upper lobe pulmonary nodule is similar. 3. Equivocal smaller pulmonary nodules on the prior exam are improved and resolved, possibly response to therapy of metastasis or inflammatory etiology. 4. No thoracic adenopathy. 5. Incidental findings, including: Aortic atherosclerosis (ICD10-I70.0), coronary artery atherosclerosis and emphysema (ICD10-J43.9). Hepatic steatosis Electronically Signed   By: Rockey Kilts M.D.   On: 07/29/2023 12:51

## 2023-08-15 NOTE — Progress Notes (Signed)
 Treatment given today per MD orders. Tolerated infusion without adverse affects. Vital signs stable. No complaints at this time. Discharged from clinic ambulatory in stable condition. Alert and oriented x 3. F/U with [facility] as scheduled.

## 2023-08-15 NOTE — Progress Notes (Signed)
Patient has been assessed, vital signs and labs have been reviewed by Dr. Katragadda. ANC, Creatinine, LFTs, and Platelets are within treatment parameters per Dr. Katragadda. The patient is good to proceed with treatment at this time. Primary RN and pharmacy aware.  

## 2023-08-15 NOTE — Progress Notes (Signed)
 Patient presents today for treatment (Imfinzi ) and follow up visit with Dr. Rogers. Heart rate elevated on arrival 122. Rechecked by RN and heart rate 121. Dr. Dorsey aware. AST 69 and ALT 83.  Labs reviewed by MD.    Message received to proceed with treatment.

## 2023-08-15 NOTE — Assessment & Plan Note (Signed)
 Obstruction on his pulmonary function testing.  This progressive dyspnea seems more subacute.  No wheezing on exam today.  He has not been treated, can never afford or obtain Trelegy.  We will try getting him on scheduled BD therapy now.  I will walk him to see if he qualifies for oxygen once he is on inhaled medication.  If he continues to have dyspnea, does not respond to BD then he likely needs a cardiac evaluation as well.  We can refer him next time depending on how he improves  We will try a new scheduled inhaler medication called Breztri .  Take 2 puffs twice a day on a schedule.  Rinse and gargle after using.  Keep track of how this medication helps you so we can continue it if you benefit. Keep albuterol  available to use 2 puffs if needed for shortness of breath, chest tightness, wheezing. We will perform a walking oximetry at your next office visit to see if you qualify for oxygen Depending on how your breathing is doing at the next visit we will decide whether you would benefit from a referral to cardiology to continue the evaluation for shortness of breath Follow-up in our office in 2 months

## 2023-08-15 NOTE — Assessment & Plan Note (Signed)
 Has been followed by Dr. Katragadda, about transition to a different oncologist.  Continue surveillance CT scan as per their recommendations.

## 2023-08-15 NOTE — Addendum Note (Signed)
 Addended by: Shavonte Zhao M on: 08/15/2023 11:26 AM   Modules accepted: Orders

## 2023-08-15 NOTE — Patient Instructions (Addendum)
 We will try a new scheduled inhaler medication called Breztri .  Take 2 puffs twice a day on a schedule.  Rinse and gargle after using.  Keep track of how this medication helps you so we can continue it if you benefit. Keep albuterol  available to use 2 puffs if needed for shortness of breath, chest tightness, wheezing. We will perform a walking oximetry at your next office visit to see if you qualify for oxygen Depending on how your breathing is doing at the next visit we will decide whether you would benefit from a referral to cardiology to continue the evaluation for shortness of breath You need to work on decreasing your cigarettes.  Ultimate goal will be to stop altogether Please continue to follow with oncology and get your surveillance CT scan of the chest as planned Follow-up in our office in 2 months

## 2023-08-15 NOTE — Assessment & Plan Note (Signed)
 Discussed cessation with him today.  He is not interested in stopping at this time.

## 2023-08-15 NOTE — Progress Notes (Signed)
 Heart rate 121 reviewed by Dr. Rogers.

## 2023-08-15 NOTE — Progress Notes (Signed)
 Subjective:    Patient ID: Carlos [name], male    DOB: [birth year], 68 y.o.   MRN: [id number]  HPI 68 year old gentleman with a history of tobacco use (50 pack years), prostate cancer, dermatofibrosarcoma of the right shoulder.  I met him initially in April 2020 for for bronchoscopy to evaluate right upper lobe mass with satellite nodule.  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  He was followed by Dr. Rogers.  He underwent chemoradiation that he finished in June 2024 and is now on Durvalumab . He is on Trelegy, has DuoNeb.  No longer on Protonix  He has been more SOB at rest and w exertion for about last month. He has baseline cough in the am, white mucous. No hemoptysis. He can hear some wheeze, especially at night. He has had some upper abdominal pain, no overt CP.  He was nevber able to get Trelegy due to cost. He uses albuterol  rarely because it causes some dizziness. Smokes 1 pk a day.   CT chest 07/29/2023 reviewed by me shows mild centrilobular emphysema, 1.6 x 1.3 right upper lobe nodule, 4.3 x 2.9 cm right perihilar upper lobe lung mass, anterior right lower lobe volume loss with atelectasis, geographic groundglass and interstitial thickening around the dominant right upper lobe mass.  Some nodules at the right minor fissure improved or resolved.  Other scattered calcified granulomas, stable 5 mm groundglass left lower lobe nodule.    Review of Systems As per HPI  Past Medical History:  Diagnosis Date   Anxiety    Cancer (HCC)    Skin cancer left shoulder   Chronic low back pain    Complication of anesthesia    Dermatofibrosarcoma protubera of right shoulder    Dyspnea    GERD (gastroesophageal reflux disease)    Hyperlipidemia    Lung cancer (HCC) 04/23/2022   PONV (postoperative nausea and vomiting)    Port-A-Cath in place 05/17/2022   Prostate cancer (HCC)      Family History  Problem  Relation Age of Onset   Dementia Mother    Cancer Father   Brother died of lung cancer  Social History   Socioeconomic History   Marital status: Divorced    Spouse name: Apolinar   Number of children: 1   Years of education: 10th   Highest education level: Not on file  Occupational History    Employer: [employer]    Comment: [employer]  Tobacco Use   Smoking status: Every Day    Current packs/day: 1.00    Average packs/day: 1 pack/day for 40.0 years (40.0 ttl pk-yrs)    Types: Cigarettes   Smokeless tobacco: Never  Vaping Use   Vaping status: Never Used  Substance and Sexual Activity   Alcohol use: Yes    Comment: 2-3 cans beer/day   Drug use: No   Sexual activity: Not on file  Other Topics Concern   Not on file  Social History Narrative   Patient lives at home with his dog. 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 Patient has 10 grade education.Patient is right-handed.Caffeine Use: 1 cup of coffee; 3-4 sodas daily   Social Drivers of Health   Financial Resource Strain: High Risk (05/10/2022)   Overall Financial Resource Strain (CARDIA)    Difficulty of Paying Living Expenses: Very hard  Food Insecurity: Food Insecurity Present (05/04/2022)   Hunger Vital Sign    Worried  About Running Out of Food in the Last Year: Sometimes true    Ran Out of Food in the Last Year: Sometimes true  Transportation Needs: No Transportation Needs (05/04/2022)   PRAPARE - Administrator, Civil Service (Medical): No    Lack of Transportation (Non-Medical): No  Physical Activity: Not on file  Stress: Not on file  Social Connections: Not on file  Intimate Partner Violence: Not At Risk (05/04/2022)   Humiliation, Afraid, Rape, and Kick questionnaire    Fear of Current or Ex-Partner: No    Emotionally Abused: No    Physically Abused: No    Sexually Abused: No  No asbestos exposure, no chemicals Does continue to smoke  Allergies   Allergen Reactions   Ciprofloxacin Swelling and Other (See Comments)    Swelling in hands, numbness in arms and joint pain   Atorvastatin     Other Reaction(s): Not available   Gabapentin  Other (See Comments)    Double vision  Other Reaction(s): Not available  gabapentin      Outpatient Medications Prior to Visit  Medication Sig Dispense Refill   acetaminophen  (TYLENOL ) 500 MG tablet Take 1,000 mg by mouth every 6 (six) hours as needed (pain.).     aspirin EC 81 MG tablet Take 81 mg by mouth at bedtime. Swallow whole.     benzonatate  (TESSALON ) 200 MG capsule Take 1 capsule by mouth three times daily as needed for cough 120 capsule 0   clonazePAM  (KLONOPIN ) 1 MG tablet TAKE 1 TABLET BY MOUTH IN THE MORNING AND 2 TABS AT BEDTIME 90 tablet 0   Durvalumab  (IMFINZI  IV) Inject into the vein every 14 (fourteen) days.     ipratropium-albuterol  (DUONEB) 0.5-2.5 (3) MG/3ML SOLN Inhale 3 mL 4 times a day by nebulization route.     lidocaine -prilocaine  (EMLA ) cream Apply a quarter-sized amount to port a cath site and cover with plastic wrap 1 hour prior to infusion appointments 30 g 3   loperamide  (IMODIUM ) 2 MG capsule Take 1 capsule (2 mg total) by mouth as needed for diarrhea or loose stools (Take 2 capsules after the first loose stool and then 1 capsule after each loose stool bt do not exceed more than 8 capsules in a 24 hr period. If it is bedtime take 2 capsules at bedtime and then take 2 capsules every 4 hours until morning). 30 capsule 1   meloxicam  (MOBIC ) 15 MG tablet Take 1 tablet (15 mg total) by mouth in the morning. 30 tablet 2   pantoprazole (PROTONIX) 40 MG tablet Take 40 mg by mouth daily.     prochlorperazine  (COMPAZINE ) 10 MG tablet Take 10 mg by mouth every 6 (six) hours as needed.     simvastatin  (ZOCOR ) 20 MG tablet Take 1 tablet (20 mg total) by mouth at bedtime. 90 tablet 3   sucralfate  (CARAFATE ) 1 g tablet Take 1 tablet (1 g total) by mouth 4 (four) times daily. Dissolve  each tablet in 15 cc water before use. 120 tablet 2   tamsulosin  (FLOMAX ) 0.4 MG CAPS capsule Take 1 capsule (0.4 mg total) by mouth at bedtime. 90 capsule 3   albuterol  (VENTOLIN  HFA) 108 (90 Base) MCG/ACT inhaler Inhale 2 puffs into the lungs every 6 (six) hours as needed for wheezing or shortness of breath. (Patient not taking: Reported on 08/15/2023) 8 g 6   TRELEGY ELLIPTA 100-62.5-25 MCG/ACT AEPB Inhale 1 puff into the lungs daily. (Patient not taking: Reported on 08/15/2023)  No facility-administered medications prior to visit.         Objective:   Physical Exam  Vitals:   08/15/23 1038  BP: 117/77  Pulse: 98  Temp: 98.7 F (37.1 C)  TempSrc: Temporal  SpO2: 97%  Weight: 177 lb 6.4 oz (80.5 kg)  Height: 5' 6 (1.676 m)   Gen: Pleasant, well-nourished, in no distress,  normal affect  ENT: No lesions,  mouth clear,  oropharynx clear, no postnasal drip  Neck: No JVD, no stridor  Lungs: No use of accessory muscles, no crackles or wheezing on normal respiration, no wheeze on forced expiration  Cardiovascular: RRR, heart sounds normal, no murmur or gallops, no peripheral edema  Musculoskeletal: No deformities, no cyanosis or clubbing  Neuro: alert, awake, non focal  Skin: Warm, no lesions or rash      Assessment & Plan:   Non-small cell cancer of right lung (HCC) Has been followed by Dr. Rogers, about transition to a different oncologist.  Continue surveillance CT scan as per their recommendations.  COPD (chronic obstructive pulmonary disease) (HCC) Obstruction on his pulmonary function testing.  This progressive dyspnea seems more subacute.  No wheezing on exam today.  He has not been treated, can never afford or obtain Trelegy.  We will try getting him on scheduled BD therapy now.  I will walk him to see if he qualifies for oxygen once he is on inhaled medication.  If he continues to have dyspnea, does not respond to BD then he likely needs a cardiac evaluation  as well.  We can refer him next time depending on how he improves  We will try a new scheduled inhaler medication called Breztri .  Take 2 puffs twice a day on a schedule.  Rinse and gargle after using.  Keep track of how this medication helps you so we can continue it if you benefit. Keep albuterol  available to use 2 puffs if needed for shortness of breath, chest tightness, wheezing. We will perform a walking oximetry at your next office visit to see if you qualify for oxygen Depending on how your breathing is doing at the next visit we will decide whether you would benefit from a referral to cardiology to continue the evaluation for shortness of breath Follow-up in our office in 2 months  Tobacco use Discussed cessation with him today.  He is not interested in stopping at this time.   Lamar Chris, MD, PhD 08/15/2023, 11:06 AM [facility] [phone] or if no answer before 7:00PM call [phone] For any issues after 7:00PM please call eLink [phone]

## 2023-08-15 NOTE — Patient Instructions (Addendum)
 [facility]  Discharge Instructions  You were seen and examined today by Dr. Rogers.  Dr. Rogers discussed your most recent lab work and CT scan which revealed that everything looks good and stable except it shows fatty liver. Dr. Rogers recommends losing about 15 lbs and eating less processed foods more plant based diet.  Dr. Katragadda will repeat CT scan in 4 months.   Follow-up as scheduled.    Thank you for choosing [facility] to provide your oncology and hematology care.   To afford each patient quality time with our provider, please arrive at least 15 minutes before your scheduled appointment time. You may need to reschedule your appointment if you arrive late (10 or more minutes). Arriving late affects you and other patients whose appointments are after yours.  Also, if you miss three or more appointments without notifying the office, you may be dismissed from the clinic at the provider's discretion.    Again, thank you for choosing [facility].  Our hope is that these requests will decrease the amount of time that you wait before being seen by our physicians.   If you have a lab appointment with the Cancer Center - please note that after April 8th, all labs will be drawn in the cancer center.  You do not have to check in or register with the main entrance as you have in the past but will complete your check-in at the cancer center.            _____________________________________________________________  Should you have questions after your visit to [facility], please contact our office at [phone] and follow the prompts.  Our office hours are 8:00 a.m. to 4:30 p.m. Monday - Thursday and 8:00 a.m. to 2:30 p.m. Friday.  Please note that voicemails left after 4:00 p.m. may not be returned until the following business day.  We are closed weekends and all major holidays.  You do have  access to a nurse 24-7, just call the main number to the clinic [phone] and do not press any options, hold on the line and a nurse will answer the phone.    For prescription refill requests, have your pharmacy contact our office and allow 72 hours.    Masks are no longer required in the cancer centers. If you would like for your care team to wear a mask while they are taking care of you, please let them know. You may have one support person who is at least 69 years old accompany you for your appointments.

## 2023-08-16 ENCOUNTER — Other Ambulatory Visit: Payer: Self-pay

## 2023-08-16 LAB — T4: T4, Total: 5.3 ug/dL (ref 4.5–12.0)

## 2023-08-29 ENCOUNTER — Other Ambulatory Visit: Payer: Self-pay | Admitting: *Deleted

## 2023-08-29 ENCOUNTER — Telehealth: Payer: Self-pay

## 2023-08-29 MED ORDER — PROCHLORPERAZINE MALEATE 10 MG PO TABS: 10.0000 mg | ORAL_TABLET | Freq: Four times a day (QID) | ORAL | 3 refills | Status: DC | PRN

## 2023-08-29 NOTE — Telephone Encounter (Signed)
 Copied from CRM #[id number]. Topic: Clinical - Medication Question >> Aug 29, 2023  9:02 AM Celestine FALCON wrote: Reason for CRM: Pt had an office visit with Dr. Shelah on 08/15/2023 and was provided a trial inhaler budesonide-glycopyrrolate-formoterol (BREZTRI  AEROSPHERE) 160-9-4.8 MCG/ACT AERO inhaler. Pt's sister Olam Finder called in and stated it did not work well for him, and she would like to know if there any alternatives to see if they would work better for him.   Pt's phone number is [phone] ok to leave a vm.  I called and spoke to pt. Pt states he does not do well with Breztri  as this just does not seem to help him. I asked pt is he was taking the Albuterol  and he stated no, and albuterol  makes him swimmy headed Routing to Dr Shelah to advise an alternative treatment option.

## 2023-08-29 NOTE — Telephone Encounter (Signed)
 If he did not benefit from the Breztri  then we can try an alternative.  Have him try Stiolto 2 puffs once daily.  If he does not benefit then we could also consider trying nebulized medication to see if he gets better delivery and benefit.

## 2023-08-30 ENCOUNTER — Other Ambulatory Visit: Payer: Self-pay

## 2023-08-30 MED ORDER — STIOLTO RESPIMAT 2.5-2.5 MCG/ACT IN AERS: 2.0000 | INHALATION_SPRAY | Freq: Every day | RESPIRATORY_TRACT | 3 refills | Status: DC

## 2023-08-30 NOTE — Telephone Encounter (Signed)
 Called and spoke with the patient. Pt stated he would like to try alternative inhaler. Stiolto rx has been sent to pharmacy and patient is aware. Nfn

## 2023-09-03 DIAGNOSIS — D3502 Benign neoplasm of left adrenal gland: Secondary | ICD-10-CM | POA: Diagnosis not present

## 2023-09-03 DIAGNOSIS — C3491 Malignant neoplasm of unspecified part of right bronchus or lung: Secondary | ICD-10-CM | POA: Diagnosis not present

## 2023-09-03 DIAGNOSIS — R002 Palpitations: Secondary | ICD-10-CM | POA: Diagnosis not present

## 2023-09-03 DIAGNOSIS — J449 Chronic obstructive pulmonary disease, unspecified: Secondary | ICD-10-CM | POA: Diagnosis not present

## 2023-09-03 DIAGNOSIS — G4700 Insomnia, unspecified: Secondary | ICD-10-CM | POA: Diagnosis not present

## 2023-09-03 DIAGNOSIS — I700 Atherosclerosis of aorta: Secondary | ICD-10-CM | POA: Diagnosis not present

## 2023-09-03 DIAGNOSIS — K219 Gastro-esophageal reflux disease without esophagitis: Secondary | ICD-10-CM | POA: Diagnosis not present

## 2023-09-03 DIAGNOSIS — M5416 Radiculopathy, lumbar region: Secondary | ICD-10-CM | POA: Diagnosis not present

## 2023-09-03 DIAGNOSIS — R42 Dizziness and giddiness: Secondary | ICD-10-CM | POA: Diagnosis not present

## 2023-09-03 DIAGNOSIS — E782 Mixed hyperlipidemia: Secondary | ICD-10-CM | POA: Diagnosis not present

## 2023-09-05 ENCOUNTER — Telehealth (HOSPITAL_BASED_OUTPATIENT_CLINIC_OR_DEPARTMENT_OTHER): Payer: Self-pay

## 2023-09-05 NOTE — Telephone Encounter (Signed)
 Sister states the medication was Respimat. Please call her if you have further questions. TY.

## 2023-09-05 NOTE — Telephone Encounter (Signed)
 Pt states cannot afford the Stiolto as it is too expensive requesting something else be sent to pharmacy

## 2023-09-05 NOTE — Telephone Encounter (Signed)
 Sending to the correct Lowanda Cashaw

## 2023-09-05 NOTE — Telephone Encounter (Signed)
 Left pt message to return call to let us  know if its the Stiolto that is too expensive as this looks like the last rx that was called in.   Copied from CRM [id number]. Topic: Clinical - Prescription Issue >> Sep 05, 2023  8:57 AM Leila C wrote: Reason for CRM: Patient's sister Olam Finder [phone] states patient medication- does not know the name of medication is $300 and cannot afford it, can another medication be sent. Informed Olam, 08/29/23 telephone message. Olam states called this Monday and no one has called patient back. Please advise and call back patient at [phone].   [facility] [address], [place] - [address] [place] [address] [place] [address] Phone: [phone] Fax: [phone]

## 2023-09-06 NOTE — Telephone Encounter (Signed)
 Can we ask pharmacy support to figure out his coverage, see if he has a medication deductible, figure out his formulary so we can find an acceptable substitute?

## 2023-09-06 NOTE — Telephone Encounter (Signed)
 Please advise formulary alternative, stiolto inhaler is too expensive.

## 2023-09-09 DIAGNOSIS — C3491 Malignant neoplasm of unspecified part of right bronchus or lung: Secondary | ICD-10-CM | POA: Diagnosis not present

## 2023-09-09 DIAGNOSIS — E782 Mixed hyperlipidemia: Secondary | ICD-10-CM | POA: Diagnosis not present

## 2023-09-09 DIAGNOSIS — K219 Gastro-esophageal reflux disease without esophagitis: Secondary | ICD-10-CM | POA: Diagnosis not present

## 2023-09-10 NOTE — Telephone Encounter (Signed)
 Patient sister is returning a call from a ms jaime concerning a prescription . Pstient sister is wanting to speak with Ms Ami . Calling cal to see if

## 2023-09-10 NOTE — Telephone Encounter (Signed)
 Patient sister is wanting a callback from Ms jaime please give her a call back concerning the patients medication [phone]

## 2023-09-10 NOTE — Telephone Encounter (Signed)
 Returned pt sister call, she is wondering about alternative med for [employer], I informed her that we were waiting to hear back form the pharmacy team as message was sent to them on Friday to advise

## 2023-09-11 ENCOUNTER — Ambulatory Visit: Admitting: Emergency Medicine

## 2023-09-11 ENCOUNTER — Telehealth: Payer: Self-pay

## 2023-09-11 ENCOUNTER — Other Ambulatory Visit (HOSPITAL_COMMUNITY): Payer: Self-pay

## 2023-09-11 ENCOUNTER — Encounter: Payer: Self-pay | Admitting: Oncology

## 2023-09-11 NOTE — Telephone Encounter (Signed)
 Patient has a deductible to meet causing the $302 price. After the deductible has been met this price should drop to $47.00. Per test claims all alternatives in the same class show the same price of $302.00 due to the deductible.

## 2023-09-11 NOTE — Telephone Encounter (Signed)
 Atc x1. No answer- left vm to call back regarding previous note about inhaler prices.

## 2023-09-20 ENCOUNTER — Emergency Department (HOSPITAL_COMMUNITY)
Admission: EM | Admit: 2023-09-20 | Discharge: 2023-09-20 | Disposition: A | Discharge status: Home / Self Care | Attending: Emergency Medicine | Admitting: Emergency Medicine

## 2023-09-20 ENCOUNTER — Emergency Department (HOSPITAL_COMMUNITY)

## 2023-09-20 ENCOUNTER — Ambulatory Visit: Payer: Self-pay

## 2023-09-20 ENCOUNTER — Other Ambulatory Visit: Payer: Self-pay

## 2023-09-20 ENCOUNTER — Encounter (HOSPITAL_COMMUNITY): Payer: Self-pay | Admitting: Emergency Medicine

## 2023-09-20 DIAGNOSIS — C3411 Malignant neoplasm of upper lobe, right bronchus or lung: Secondary | ICD-10-CM | POA: Diagnosis not present

## 2023-09-20 DIAGNOSIS — R7989 Other specified abnormal findings of blood chemistry: Secondary | ICD-10-CM | POA: Diagnosis not present

## 2023-09-20 DIAGNOSIS — Z7982 Long term (current) use of aspirin: Secondary | ICD-10-CM | POA: Insufficient documentation

## 2023-09-20 DIAGNOSIS — F17200 Nicotine dependence, unspecified, uncomplicated: Secondary | ICD-10-CM | POA: Insufficient documentation

## 2023-09-20 DIAGNOSIS — J449 Chronic obstructive pulmonary disease, unspecified: Secondary | ICD-10-CM | POA: Diagnosis not present

## 2023-09-20 DIAGNOSIS — J441 Chronic obstructive pulmonary disease with (acute) exacerbation: Secondary | ICD-10-CM | POA: Diagnosis not present

## 2023-09-20 DIAGNOSIS — R918 Other nonspecific abnormal finding of lung field: Secondary | ICD-10-CM | POA: Diagnosis not present

## 2023-09-20 DIAGNOSIS — C3490 Malignant neoplasm of unspecified part of unspecified bronchus or lung: Secondary | ICD-10-CM | POA: Diagnosis not present

## 2023-09-20 DIAGNOSIS — J984 Other disorders of lung: Secondary | ICD-10-CM | POA: Diagnosis not present

## 2023-09-20 DIAGNOSIS — R0602 Shortness of breath: Secondary | ICD-10-CM | POA: Diagnosis not present

## 2023-09-20 DIAGNOSIS — K760 Fatty (change of) liver, not elsewhere classified: Secondary | ICD-10-CM | POA: Diagnosis not present

## 2023-09-20 LAB — CBC
HCT: 45.3 % (ref 39.0–52.0)
Hemoglobin: 15.7 g/dL (ref 13.0–17.0)
MCH: 32 pg (ref 26.0–34.0)
MCHC: 34.7 g/dL (ref 30.0–36.0)
MCV: 92.4 fL (ref 80.0–100.0)
Platelets: 242 K/uL (ref 150–400)
RBC: 4.9 MIL/uL (ref 4.22–5.81)
RDW: 14.5 % (ref 11.5–15.5)
WBC: 7.7 K/uL (ref 4.0–10.5)
nRBC: 0 % (ref 0.0–0.2)

## 2023-09-20 LAB — BASIC METABOLIC PANEL WITH GFR
Anion gap: 18 — ABNORMAL HIGH (ref 5–15)
BUN: 6 mg/dL — ABNORMAL LOW (ref 8–23)
CO2: 16 mmol/L — ABNORMAL LOW (ref 22–32)
Calcium: 8.4 mg/dL — ABNORMAL LOW (ref 8.9–10.3)
Chloride: 99 mmol/L (ref 98–111)
Creatinine, Ser: 0.85 mg/dL (ref 0.61–1.24)
GFR, Estimated: >60 mL/min (ref >60)
Glucose, Bld: 100 mg/dL — ABNORMAL HIGH (ref 70–99)
Potassium: 4 mmol/L (ref 3.5–5.1)
Sodium: 133 mmol/L — ABNORMAL LOW (ref 135–145)

## 2023-09-20 LAB — HEPATIC FUNCTION PANEL
ALT: 149 U/L — ABNORMAL HIGH (ref 0–44)
AST: 165 U/L — ABNORMAL HIGH (ref 15–41)
Albumin: 3.8 g/dL (ref 3.5–5.0)
Alkaline Phosphatase: 95 U/L (ref 38–126)
Bilirubin, Direct: 0.3 mg/dL — ABNORMAL HIGH (ref 0.0–0.2)
Indirect Bilirubin: 1 mg/dL — ABNORMAL HIGH (ref 0.3–0.9)
Total Bilirubin: 1.3 mg/dL — ABNORMAL HIGH (ref 0.0–1.2)
Total Protein: 7.4 g/dL (ref 6.5–8.1)

## 2023-09-20 LAB — TROPONIN I (HIGH SENSITIVITY)
Troponin I (High Sensitivity): 3 ng/L (ref <18)
Troponin I (High Sensitivity): 3 ng/L (ref <18)

## 2023-09-20 LAB — BRAIN NATRIURETIC PEPTIDE: B Natriuretic Peptide: 24 pg/mL (ref 0.0–100.0)

## 2023-09-20 MED ORDER — IOHEXOL 350 MG/ML SOLN
100.0000 mL | Freq: Once | INTRAVENOUS | Status: AC | PRN
  Administered 2023-09-20: 100 mL via INTRAVENOUS

## 2023-09-20 MED ORDER — CLONAZEPAM 1 MG PO TABS: 1.0000 mg | ORAL_TABLET | Freq: Every day | ORAL | 0 refills | Status: DC

## 2023-09-20 MED ORDER — IPRATROPIUM-ALBUTEROL 0.5-2.5 (3) MG/3ML IN SOLN
3.0000 mL | Freq: Once | RESPIRATORY_TRACT | Status: AC
  Administered 2023-09-20: 3 mL via RESPIRATORY_TRACT
  Filled 2023-09-20: qty 3

## 2023-09-20 MED ORDER — PREDNISONE 50 MG PO TABS
60.0000 mg | ORAL_TABLET | Freq: Once | ORAL | Status: AC
  Administered 2023-09-20: 60 mg via ORAL
  Filled 2023-09-20: qty 1

## 2023-09-20 MED ORDER — LEVALBUTEROL HCL 0.63 MG/3ML IN NEBU: 0.6300 mg | INHALATION_SOLUTION | RESPIRATORY_TRACT | 12 refills | Status: AC | PRN

## 2023-09-20 MED ORDER — PREDNISONE 20 MG PO TABS: 60.0000 mg | ORAL_TABLET | Freq: Every day | ORAL | 0 refills | Status: AC

## 2023-09-20 MED ORDER — DOXYCYCLINE HYCLATE 100 MG PO CAPS: 100.0000 mg | ORAL_CAPSULE | Freq: Two times a day (BID) | ORAL | 0 refills | Status: AC

## 2023-09-20 MED ORDER — BENZONATATE 200 MG PO CAPS: 200.0000 mg | ORAL_CAPSULE | Freq: Three times a day (TID) | ORAL | 0 refills | Status: AC | PRN

## 2023-09-20 NOTE — ED Notes (Signed)
 Patient Alert and oriented to baseline. Stable and ambulatory to baseline. Patient verbalized understanding of the discharge instructions.  Patient belongings were taken by the patient.

## 2023-09-20 NOTE — Telephone Encounter (Signed)
 FYI Only or Action Required?: FYI only for provider.  Patient is followed in Pulmonology for COPD, Lung Cancer last seen on 08/15/2023 by [name], MD.  Called Nurse Triage reporting No chief complaint on file..  Symptoms began several weeks ago.  Interventions attempted: Increased fluids/rest.  Symptoms are: rapidly worsening.  Triage Disposition: Go to ED Now (Notify PCP)  Patient/caregiver understands and will follow disposition?: Yes  Copied from CRM #[id number]. Topic: Clinical - Red Word Triage >> Sep 20, 2023  8:34 AM Nathanel DEL wrote: Red Word that prompted transfer to Nurse Triage: pt having difficulty breathing.  Pt has lung cancer, but still continues to smoke.  Carlos [name], daughter in law on phone  [phone], Rosina, Daughter In [place] for Transportation and Triage.   Reason for Disposition  [1] MODERATE difficulty breathing (e.g., speaks in phrases, SOB even at rest, pulse 100-120) AND [2] NEW-onset or WORSE than normal  Answer Assessment - Initial Assessment Questions 1. RESPIRATORY STATUS: Describe your breathing? (e.g., wheezing, shortness of breath, unable to speak, severe coughing)      Dyspnea lying flat or sitting up.   2. ONSET: When did this breathing problem begin?      Several Months  3. PATTERN Does the difficult breathing come and go, or has it been constant since it started?      Intermittent  4. SEVERITY: How bad is your breathing? (e.g., mild, moderate, severe)      Moderate  5. RECURRENT SYMPTOM: Have you had difficulty breathing before? If Yes, ask: When was the last time? and What happened that time?      Yes  6. CARDIAC HISTORY: Do you have any history of heart disease? (e.g., heart attack, angina, bypass surgery, angioplasty)      Mediastinal Adenopathy  7. LUNG HISTORY: Do you have any history of lung disease?  (e.g., pulmonary embolus, asthma, emphysema)     Lung Cancer, Smoking, COPD  8. CAUSE: What do you think is  causing the breathing problem?      Unsure  9. OTHER SYMPTOMS: Do you have any other symptoms? (e.g., chest pain, cough, dizziness, fever, runny nose)     Coughing, Runny Nose  10. O2 SATURATION MONITOR:  Do you use an oxygen saturation monitor (pulse oximeter) at home? If Yes, ask: What is your reading (oxygen level) today? What is your usual oxygen saturation reading? (e.g., 95%)       Unsure, Does not have access currently  12. TRAVEL: Have you traveled out of the country in the last month? (e.g., travel history, exposures)       No and No  Protocols used: Breathing Difficulty-A-AH

## 2023-09-20 NOTE — Discharge Instructions (Addendum)
 Were seen in the emergency department for shortness of breath.  You had blood work EKG chest x-ray and a CAT scan of your chest.  You were offered admission to the hospital but you felt you could manage her symptoms at home.  We are prescribing medication for breathing treatments along with steroids and antibiotics.  Please follow-up with primary care doctor and reach out to pulmonary Dr Darlean to see if he can see you in the clinic.  Return to the emergency department if any worsening or concerning symptoms

## 2023-09-20 NOTE — Telephone Encounter (Signed)
 FYI Only or Action Required?: Action required by provider: update on patient condition.  Patient is followed in Pulmonology for nodules, last seen on 08/15/2023 by [name], MD.  Called Nurse Triage reporting Hospitalization Follow-up.  Symptoms began several weeks ago.  Interventions attempted: Other: discharged home with doxy, prednisone , xopenex .  Symptoms are: unknown.  Triage Disposition: Go to ED Now (or PCP Triage)  Patient/caregiver understands and will follow disposition?: Unsure      Copied from CRM [id number]. Topic: Clinical - Red Word Triage >> Sep 20, 2023  3:41 PM Leila C wrote: Red Word that prompted transfer to Nurse Triage: Patient's daughter in law Rosina [phone] states patient was sent to ER [facility] for COPD exacerbation, was advised to be admitted, but decided to discharged today from the hospital. Patient has an appointment for 10/15/23 NP, Hope and wants to see Dr. Darlean in [place]?  Rosina states patient needs to be seen as soon as possible for a hospital follow up. Rosina states patient has shortness of breath, but unsure of wheezing. Please advise.    ----------------------------------------------------------------------- From previous Reason for Contact - Scheduling: Patient/patient representative is calling to schedule an appointment. Refer to attachments for appointment information. Reason for Disposition  Patient sounds very sick or weak to the triager  Answer Assessment - Initial Assessment Questions 1. REASON FOR CALL: What is the main reason for your call? or How can I best help you?     Pt daughter calling to schedule HFU d/t  SOB. Triager noted that pt was previous triaged today and advised ED. Pt went to ED as advised and in ED, they strongly recommended admission, but pt insistent on being discharged home to manage at home. Daughter is not currently with pt at this time and endorses that the AVS discharge instructions  state to see [facility] ASAP.  Of note, pt was discharged home with xopenex , doxy, and prednisone . 2. SYMPTOMS : Do you have any symptoms?      SOB - triage limited d/t pt not being present and daughter did not know if sx have improved after being discharged from ED.  Triager strongly advised daughter to urge pt to go back to the ED for continued treatment. Triager was unable to find any availability with [facility] and stressed importance of going back to ED.  3. OTHER QUESTIONS: Do you have any other questions?     N/a    Triager strongly advised daughter to urge pt to go back to the ED for continued treatment. Triager was unable to find any availability with [facility] and stressed importance of going back to ED.  Triager will forward encounter for Dr Shelah 's office to review and see if pt request for HFU appt ASAP can be accommodated. Caregiver verbalized understanding and agrees that patient needs to go back to ED, but does not think she will be successful in getting patient back.  Answer Assessment - Initial Assessment Questions 1. MAIN CONCERN OR SYMPTOM: What's the main symptom you're worried about? (e.g., breathing problem, cough, sputum change, wheezing) What question do you have?     SOB 2. ONSET: When did the  sx  start?      See alternate NT encounter 3. BETTER-SAME-WORSE: Are you getting better, staying the same, or getting worse compared to the day you were discharged?     unknown 4. DISCHARGE DATE: What date were you discharged from the hospital?     Discharged same day 9/12 5. DISCHARGE  DOCTOR: Who is the main doctor taking care of you now?     See chart 6. DISCHARGE APPOINTMENT: Have you scheduled a follow-up discharge appointment with your doctor?     Calling to schedule one ASAP 7. DISCHARGE MEDICINES: Did the doctor who discharged you order any new medicines for you to use? If yes, have you filled the prescription and started taking the medicine?      Doxy,  xopenex , prednisone  8. OXYGEN THERAPY:      N/a 9. MONITORING Do you monitor/measure your oxygen level or vital signs (e.g., yes, no, measurements are automatically sent to call center). Document current and normal values if available.       unknown 10. BREATHING DIFFICULTY: Are you having any trouble breathing? If Yes, ask: How bad is it?  (e.g., none, mild, moderate, severe)        unknown 11. FEVER: Do you have a fever? If Yes, ask: What is your temperature, how was it measured, and when did it start?       *No Answer* 12. SPUTUM: Describe the color of your sputum (phlegm). (clear, white, yellow, green, blood-tinged)       *No Answer* 13. OTHER SYMPTOMS: Do you have any other symptoms? (e.g., fever)       *No Answer* 14. SMOKING: Do you smoke currently?       *No Answer*  Protocols used: COPD Post-Hospitalization Follow-up Call-A-AH, Information Only Call - No Triage-A-AH

## 2023-09-20 NOTE — ED Provider Notes (Signed)
 [place] EMERGENCY DEPARTMENT AT [facility] Provider Note   CSN: [id number] Arrival date & time: 09/20/23  1008     Patient presents with: Shortness of Breath   Carlos [name] is a 68 y.o. male.  He has a history of lung cancer and just completed chemotherapy after getting radiation.  Continues to smoke.  Complaining of shortness of breath has been going on for few months but worsened over the last week.  Dyspneic at rest.  No significant cough.  No fevers or hemoptysis.  No chest pain or leg swelling.  Does not use inhalers.  Tried to call pulmonology but does not have an appointment until October.   The history is provided by the patient.  Shortness of Breath Severity:  Severe Onset quality:  Gradual Duration:  3 months Timing:  Constant Progression:  Worsening Relieved by:  Nothing Worsened by:  Activity Ineffective treatments:  Rest Associated symptoms: no abdominal pain, no chest pain, no cough, no fever, no hemoptysis and no sputum production   Risk factors: hx of cancer and tobacco use        Prior to Admission medications   Medication Sig Start Date End Date Taking? Authorizing Provider  acetaminophen  (TYLENOL ) 500 MG tablet Take 1,000 mg by mouth every 6 (six) hours as needed (pain.).    [provider]  albuterol  (VENTOLIN  HFA) 108 (90 Base) MCG/ACT inhaler Inhale 2 puffs into the lungs every 6 (six) hours as needed for wheezing or shortness of breath. 11/22/22   Rogers Hai, MD  aspirin EC 81 MG tablet Take 81 mg by mouth at bedtime. Swallow whole.    [provider]  benzonatate  (TESSALON ) 200 MG capsule Take 1 capsule by mouth three times daily as needed for cough 06/14/23   Rogers Hai, MD  budesonide-glycopyrrolate-formoterol (BREZTRI  AEROSPHERE) 160-9-4.8 MCG/ACT AERO inhaler X2 samples given 08/15/23   Estevan Norleen Ferretti, MD  clonazePAM  (KLONOPIN ) 1 MG tablet TAKE 1 TABLET BY MOUTH IN THE MORNING AND 2 TABS AT BEDTIME  06/12/23   Rogers Hai, MD  Durvalumab  (IMFINZI  IV) Inject into the vein every 14 (fourteen) days.    [provider]  ipratropium-albuterol  (DUONEB) 0.5-2.5 (3) MG/3ML SOLN Inhale 3 mL 4 times a day by nebulization route. 07/17/23   [provider]  lidocaine -prilocaine  (EMLA ) cream Apply a quarter-sized amount to port a cath site and cover with plastic wrap 1 hour prior to infusion appointments 11/22/22   Rogers Hai, MD  loperamide  (IMODIUM ) 2 MG capsule Take 1 capsule (2 mg total) by mouth as needed for diarrhea or loose stools (Take 2 capsules after the first loose stool and then 1 capsule after each loose stool bt do not exceed more than 8 capsules in a 24 hr period. If it is bedtime take 2 capsules at bedtime and then take 2 capsules every 4 hours until morning). 03/11/23   Katragadda, Sreedhar, MD  meloxicam  (MOBIC ) 15 MG tablet Take 1 tablet (15 mg total) by mouth in the morning. 04/15/23   Rogers Hai, MD  pantoprazole (PROTONIX) 40 MG tablet Take 40 mg by mouth daily. 09/12/20   [provider]  prochlorperazine  (COMPAZINE ) 10 MG tablet Take 1 tablet (10 mg total) by mouth every 6 (six) hours as needed. 08/29/23   Lamon Pleasant HERO, PA-C  simvastatin  (ZOCOR ) 20 MG tablet Take 1 tablet (20 mg total) by mouth at bedtime. 03/21/23   Rogers Hai, MD  sucralfate  (CARAFATE ) 1 g tablet Take 1 tablet (  1 g total) by mouth 4 (four) times daily. Dissolve each tablet in 15 cc water before use. 06/19/22   Dewey Rush, MD  tamsulosin  (FLOMAX ) 0.4 MG CAPS capsule Take 1 capsule (0.4 mg total) by mouth at bedtime. 03/29/23   Kandala, Hyndavi, MD  Tiotropium Bromide-Olodaterol (STIOLTO RESPIMAT ) 2.5-2.5 MCG/ACT AERS Inhale 2 puffs into the lungs daily. 08/30/23   [name], MD  TRELEGY ELLIPTA 100-62.5-25 MCG/ACT AEPB Inhale 1 puff into the lungs daily. Patient not taking: Reported on 08/15/2023 07/05/23   [provider]    Allergies:  Ciprofloxacin, Atorvastatin, and Gabapentin     Review of Systems  Constitutional:  Negative for fever.  Respiratory:  Positive for shortness of breath. Negative for cough, hemoptysis and sputum production.   Cardiovascular:  Negative for chest pain.  Gastrointestinal:  Negative for abdominal pain.    Updated Vital Signs BP (!) 147/90   Pulse (!) 107   Temp 97.8 F (36.6 C) (Oral)   Resp (!) 27   SpO2 93%   Physical Exam Vitals and nursing note reviewed.  Constitutional:      Appearance: He is well-developed.  HENT:     Head: Normocephalic and atraumatic.  Eyes:     Conjunctiva/sclera: Conjunctivae normal.  Cardiovascular:     Rate and Rhythm: Normal rate and regular rhythm.     Heart sounds: No murmur heard. Pulmonary:     Effort: Tachypnea and accessory muscle usage present. No respiratory distress.     Breath sounds: Normal breath sounds.  Abdominal:     Palpations: Abdomen is soft.     Tenderness: There is no abdominal tenderness.  Musculoskeletal:     Cervical back: Neck supple.     Right lower leg: No tenderness. No edema.     Left lower leg: No tenderness. No edema.  Skin:    General: Skin is warm and dry.  Neurological:     General: No focal deficit present.     Mental Status: He is alert.     GCS: GCS eye subscore is 4. GCS verbal subscore is 5. GCS motor subscore is 6.     (all labs ordered are listed, but only abnormal results are displayed) Labs Reviewed  BASIC METABOLIC PANEL WITH GFR - Abnormal; Notable for the following components:      Result Value   Sodium 133 (*)    CO2 16 (*)    Glucose, Bld 100 (*)    BUN 6 (*)    Calcium 8.4 (*)    Anion gap 18 (*)    All other components within normal limits  HEPATIC FUNCTION PANEL - Abnormal; Notable for the following components:   AST 165 (*)    ALT 149 (*)    Total Bilirubin 1.3 (*)    Bilirubin, Direct 0.3 (*)    Indirect Bilirubin 1.0 (*)    All other components within normal limits  CBC   BRAIN NATRIURETIC PEPTIDE  TROPONIN I (HIGH SENSITIVITY)  TROPONIN I (HIGH SENSITIVITY)    EKG: EKG Interpretation Date/Time:  Friday September 20 2023 10:23:50 EDT Ventricular Rate:  107 PR Interval:  185 QRS Duration:  82 QT Interval:  339 QTC Calculation: 453 R Axis:   55  Text Interpretation: Sinus tachycardia Low voltage, precordial leads increased rate from prior 6/18 Confirmed by Towana Sharper [phone]) on 09/20/2023 10:33:58 AM  Radiology: CT Angio Chest PE W/Cm &/Or Wo Cm Result Date: 09/20/2023 CLINICAL DATA:  Shortness of breath for 3 months.  History of non-small cell lung cancer. EXAM: CT ANGIOGRAPHY CHEST WITH CONTRAST TECHNIQUE: Multidetector CT imaging of the chest was performed using the standard protocol during bolus administration of intravenous contrast. Multiplanar CT image reconstructions and MIPs were obtained to evaluate the vascular anatomy. RADIATION DOSE REDUCTION: This exam was performed according to the departmental dose-optimization program which includes automated exposure control, adjustment of the mA and/or kV according to patient size and/or use of iterative reconstruction technique. CONTRAST:  OMNIPAQUE  IOHEXOL  350 MG/ML SOLN COMPARISON:  July 29, 2023. FINDINGS: Cardiovascular: Satisfactory opacification of the pulmonary arteries to the segmental level. No evidence of pulmonary embolism. Normal heart size. No pericardial effusion. Atherosclerosis of thoracic aorta is noted. Mediastinum/Nodes: No enlarged mediastinal, hilar, or axillary lymph nodes. Thyroid  gland, trachea, and esophagus demonstrate no significant findings. Lungs/Pleura: No pneumothorax or pleural effusion is noted. Grossly stable 4.3 x 2.6 cm mass is noted in right perihilar region consistent with history of malignancy. 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 Stable 13 mm spiculated density is noted more superiorly in right upper lobe concerning  for metastatic disease. Upper Abdomen: Hepatic steatosis. Musculoskeletal: No chest wall abnormality. No acute or significant osseous findings. Review of the MIP images confirms the above findings. IMPRESSION: 1. No definite evidence of pulmonary embolus. 2. Grossly stable 4.3 x 2.6 cm mass is noted in right perihilar region consistent with history of malignancy. 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 3. Stable 13 mm spiculated density is noted more superiorly in right upper lobe concerning for metastatic disease. 4. Hepatic steatosis. 5. Aortic atherosclerosis. Aortic Atherosclerosis (ICD10-I70.0). Electronically Signed   By: Lynwood Landy Raddle M.D.   On: 09/20/2023 12:06   DG Chest Port 1 View Result Date: 09/20/2023 EXAM: 1 VIEW XRAY OF THE CHEST 09/20/2023 10:55:00 AM COMPARISON: 04/23/2022 CLINICAL HISTORY: Pt c/o of feeling sob x 3 months w/ hx of copd. Pt still smokes and states his breathing has gotten worse over the last few days. FINDINGS: LUNGS AND PLEURA: The right mid lung zone pulmonary mass is again identified, better seen on chest CT of 08/01/2023; however, the overlying consolidation has improved. There is new mid lung scarring and right-sided volume loss, likely representing posttreatment change. Left lung is clear. No pneumothorax or pleural effusion. HEART AND MEDIASTINUM: Cardiac size within normal limits. Pulmonary vascularity is normal. BONES AND SOFT TISSUES: No acute bone abnormality. Left internal jugular chest port has been placed in the interval. IMPRESSION: 1. Right mid lung zone pulmonary mass, better seen on chest CT of August 01, 2023, with improved overlying consolidation. 2. New mid lung scarring and right-sided volume loss, likely representing posttreatment change. 3. Left internal jugular chest port in place. Electronically signed by: Dorethia Molt MD 09/20/2023 11:23 AM EDT RP Workstation: [id number]     Procedures   Medications  Ordered in the ED  iohexol  (OMNIPAQUE ) 350 MG/ML injection 100 mL (100 mLs Intravenous Contrast Given 09/20/23 1141)  ipratropium-albuterol  (DUONEB) 0.5-2.5 (3) MG/3ML nebulizer solution 3 mL (3 mLs Nebulization Given 09/20/23 1250)  predniSONE  (DELTASONE ) tablet 60 mg (60 mg Oral Given 09/20/23 1250)    Clinical Course as of 09/20/23 1716  Fri Sep 20, 2023  1152 X-ray showing right mid lung mass.  Awaiting radiology reading [MB]  1355 Patient did an ambulatory pulse ox and sats stayed above 93% but heart rate jumped to 150.  He still tachypneic.  He does not want to stay in the hospital.  Son  is asking if I can prescribe him Xopenex  as he did not do well with albuterol  as it made him panicky. [MB]    Clinical Course User Index [MB] Towana Ozell BROCKS, MD                                 Medical Decision Making Amount and/or Complexity of Data Reviewed Labs: ordered. Radiology: ordered.  Risk Prescription drug management.   This patient complains of progressive shortness of breath; this involves an extensive number of treatment Options and is a complaint that carries with it a high risk of complications and morbidity. The differential includes COPD, CHF, pneumonia, pneumothorax, PE, worsening cancer  I ordered, reviewed and interpreted labs, which included CBC normal, chemistries with low bicarb, LFTs mildly elevated, troponins flat BNP normal I ordered medication steroids and breathing treatment and reviewed PMP when indicated. I ordered imaging studies which included chest x-ray, CT angio chest and I independently    visualized and interpreted imaging which showed no evidence of PE.  No clear infiltrate but does have evidence of prior lung cancer Additional history obtained from patient's family member Previous records obtained and reviewed in epic including recent oncology notes Cardiac monitoring reviewed, sinus tachycardia Social determinants considered, tobacco use, food and  financial insecurity Critical Interventions: None  After the interventions stated above, I reevaluated the patient and found patient still to be borderline tachycardic and tachypneic Admission and further testing considered, he was offered admission to the hospital for further management.  He is declining admission.  Will treat with steroids and breathing treatments.  Given contact information for outpatient pulmonology.  Return instructions discussed      Final diagnoses:  COPD exacerbation (HCC)  Primary malignant neoplasm of lung metastatic to other site, unspecified laterality [facility])    ED Discharge Orders          Ordered    levalbuterol  (XOPENEX ) 0.63 MG/3ML nebulizer solution  Every 4 hours PRN        09/20/23 1358    predniSONE  (DELTASONE ) 20 MG tablet  Daily        09/20/23 1358    doxycycline  (VIBRAMYCIN ) 100 MG capsule  2 times daily        09/20/23 1358               Towana Ozell BROCKS, MD 09/20/23 1718

## 2023-09-20 NOTE — ED Notes (Signed)
 While ambulating, HR jumped above 150 and oxygen remained 94%. Pt breathing labored.

## 2023-09-20 NOTE — Telephone Encounter (Signed)
 FYI pt to ED

## 2023-09-20 NOTE — ED Triage Notes (Signed)
 Pt c/o of feeling sob x 3 months w/ hx of copd. Pt still smokes and states his breathing has gotten worse over the last few days.

## 2023-09-20 NOTE — ED Notes (Signed)
 Pt returned from CT

## 2023-09-20 NOTE — Telephone Encounter (Signed)
 Thanks

## 2023-09-23 DIAGNOSIS — F17210 Nicotine dependence, cigarettes, uncomplicated: Secondary | ICD-10-CM | POA: Diagnosis not present

## 2023-09-23 DIAGNOSIS — Z7689 Persons encountering health services in other specified circumstances: Secondary | ICD-10-CM | POA: Diagnosis not present

## 2023-09-23 DIAGNOSIS — Z23 Encounter for immunization: Secondary | ICD-10-CM | POA: Diagnosis not present

## 2023-09-23 NOTE — Telephone Encounter (Signed)
 LVM for patient to call and discuss scheduling the HFU appointment

## 2023-09-23 NOTE — Telephone Encounter (Signed)
 Per dr wert :  Next available can overbook on a Wednesday pm

## 2023-09-23 NOTE — Telephone Encounter (Signed)
 Pt Needs hsp f/u ASAP  Called to inform pt that we have received his request and will be getting him scheduled

## 2023-09-23 NOTE — Telephone Encounter (Signed)
 Pt is requesting HFU ASAP

## 2023-09-26 ENCOUNTER — Other Ambulatory Visit: Payer: Self-pay | Admitting: *Deleted

## 2023-09-26 MED ORDER — PROCHLORPERAZINE MALEATE 10 MG PO TABS: 10.0000 mg | ORAL_TABLET | Freq: Four times a day (QID) | ORAL | 3 refills | Status: AC | PRN

## 2023-09-26 NOTE — Telephone Encounter (Signed)
 Called to scheduled appt for pt and he refuses appt and denies the information I have from the CRM. NFN

## 2023-10-01 DIAGNOSIS — C3491 Malignant neoplasm of unspecified part of right bronchus or lung: Secondary | ICD-10-CM | POA: Diagnosis not present

## 2023-10-01 DIAGNOSIS — R42 Dizziness and giddiness: Secondary | ICD-10-CM | POA: Diagnosis not present

## 2023-10-01 DIAGNOSIS — I700 Atherosclerosis of aorta: Secondary | ICD-10-CM | POA: Diagnosis not present

## 2023-10-01 DIAGNOSIS — M5416 Radiculopathy, lumbar region: Secondary | ICD-10-CM | POA: Diagnosis not present

## 2023-10-01 DIAGNOSIS — G4700 Insomnia, unspecified: Secondary | ICD-10-CM | POA: Diagnosis not present

## 2023-10-01 DIAGNOSIS — J449 Chronic obstructive pulmonary disease, unspecified: Secondary | ICD-10-CM | POA: Diagnosis not present

## 2023-10-01 DIAGNOSIS — R002 Palpitations: Secondary | ICD-10-CM | POA: Diagnosis not present

## 2023-10-01 DIAGNOSIS — K219 Gastro-esophageal reflux disease without esophagitis: Secondary | ICD-10-CM | POA: Diagnosis not present

## 2023-10-01 DIAGNOSIS — D3502 Benign neoplasm of left adrenal gland: Secondary | ICD-10-CM | POA: Diagnosis not present

## 2023-10-01 DIAGNOSIS — E782 Mixed hyperlipidemia: Secondary | ICD-10-CM | POA: Diagnosis not present

## 2023-10-14 ENCOUNTER — Telehealth: Payer: Self-pay

## 2023-10-14 NOTE — Telephone Encounter (Signed)
 Copied from CRM [id number]. Topic: Clinical - Prescription Issue >> Oct 10, 2023 12:01 PM Rozanna MATSU wrote: Reason for CRM: Pt daughter in law calling stated they faxed a form for the provider to fill out for his Breztri  inhaler and it was faxed ysterday afternoon  Called and spoke with Rosina [facility]) and advised I have not received any fax. I provided B pod fax number and daughter in law will fax form 10/8. Will await to receive form.

## 2023-10-15 ENCOUNTER — Ambulatory Visit: Admitting: Primary Care

## 2023-10-18 NOTE — Telephone Encounter (Signed)
No fax has been received

## 2023-10-25 DIAGNOSIS — J449 Chronic obstructive pulmonary disease, unspecified: Secondary | ICD-10-CM | POA: Diagnosis not present

## 2023-10-25 DIAGNOSIS — R002 Palpitations: Secondary | ICD-10-CM | POA: Diagnosis not present

## 2023-10-25 DIAGNOSIS — C3491 Malignant neoplasm of unspecified part of right bronchus or lung: Secondary | ICD-10-CM | POA: Diagnosis not present

## 2023-10-25 DIAGNOSIS — G4700 Insomnia, unspecified: Secondary | ICD-10-CM | POA: Diagnosis not present

## 2023-10-25 DIAGNOSIS — K219 Gastro-esophageal reflux disease without esophagitis: Secondary | ICD-10-CM | POA: Diagnosis not present

## 2023-10-25 DIAGNOSIS — M5416 Radiculopathy, lumbar region: Secondary | ICD-10-CM | POA: Diagnosis not present

## 2023-10-25 DIAGNOSIS — I700 Atherosclerosis of aorta: Secondary | ICD-10-CM | POA: Diagnosis not present

## 2023-10-25 DIAGNOSIS — D3502 Benign neoplasm of left adrenal gland: Secondary | ICD-10-CM | POA: Diagnosis not present

## 2023-10-25 DIAGNOSIS — E782 Mixed hyperlipidemia: Secondary | ICD-10-CM | POA: Diagnosis not present

## 2023-10-25 DIAGNOSIS — R42 Dizziness and giddiness: Secondary | ICD-10-CM | POA: Diagnosis not present

## 2023-10-28 NOTE — Telephone Encounter (Signed)
 Copied from CRM #[id number]. Topic: General - Other >> Oct 25, 2023  4:10 PM Joesph PARAS wrote: Reason for CRM: Patient's child is calling to inquire if a form has been received regarding Breztri .   Form has been received & faxed successfully to [phone]

## 2023-11-06 MED ORDER — BREZTRI AEROSPHERE 160-9-4.8 MCG/ACT IN AERO: 2.0000 | INHALATION_SPRAY | Freq: Two times a day (BID) | RESPIRATORY_TRACT | 12 refills | Status: AC

## 2023-11-06 NOTE — Addendum Note (Signed)
 Addended byBETHA FRIES, Annalysa Mohammad A on: 11/06/2023 01:16 PM   Modules accepted: Orders

## 2023-11-11 NOTE — Telephone Encounter (Signed)
 Breztri  rx faxed to [facility] 10/29.

## 2023-11-22 ENCOUNTER — Other Ambulatory Visit: Payer: Self-pay | Admitting: Oncology

## 2023-12-03 ENCOUNTER — Other Ambulatory Visit: Payer: Self-pay | Admitting: *Deleted

## 2023-12-03 DIAGNOSIS — R200 Anesthesia of skin: Secondary | ICD-10-CM

## 2023-12-03 MED ORDER — LIDOCAINE-PRILOCAINE 2.5-2.5 % EX CREA: 1.0000 | TOPICAL_CREAM | CUTANEOUS | 2 refills | Status: AC | PRN

## 2023-12-04 ENCOUNTER — Other Ambulatory Visit

## 2023-12-04 ENCOUNTER — Ambulatory Visit (HOSPITAL_COMMUNITY)

## 2023-12-04 ENCOUNTER — Inpatient Hospital Stay: Attending: Hematology

## 2023-12-04 ENCOUNTER — Ambulatory Visit (HOSPITAL_COMMUNITY)
Admission: RE | Admit: 2023-12-04 | Discharge: 2023-12-04 | Disposition: A | Discharge status: Home / Self Care | Source: Ambulatory Visit | Attending: Hematology | Admitting: Hematology

## 2023-12-04 VITALS — BP 119/79 | HR 138 | Temp 98.8°F | Resp 22

## 2023-12-04 DIAGNOSIS — C3491 Malignant neoplasm of unspecified part of right bronchus or lung: Secondary | ICD-10-CM | POA: Insufficient documentation

## 2023-12-04 DIAGNOSIS — C3411 Malignant neoplasm of upper lobe, right bronchus or lung: Secondary | ICD-10-CM | POA: Insufficient documentation

## 2023-12-04 DIAGNOSIS — R000 Tachycardia, unspecified: Secondary | ICD-10-CM

## 2023-12-04 LAB — MAGNESIUM: Magnesium: 1.8 mg/dL (ref 1.7–2.4)

## 2023-12-04 LAB — CBC WITH DIFFERENTIAL/PLATELET
Abs Immature Granulocytes: 0.04 K/uL (ref 0.00–0.07)
Basophils Absolute: 0.1 K/uL (ref 0.0–0.1)
Basophils Relative: 1 %
Eosinophils Absolute: 0 K/uL (ref 0.0–0.5)
Eosinophils Relative: 0 %
HCT: 43.7 % (ref 39.0–52.0)
Hemoglobin: 15.2 g/dL (ref 13.0–17.0)
Immature Granulocytes: 1 %
Lymphocytes Relative: 5 %
Lymphs Abs: 0.4 K/uL — ABNORMAL LOW (ref 0.7–4.0)
MCH: 33.3 pg (ref 26.0–34.0)
MCHC: 34.8 g/dL (ref 30.0–36.0)
MCV: 95.6 fL (ref 80.0–100.0)
Monocytes Absolute: 0.6 K/uL (ref 0.1–1.0)
Monocytes Relative: 8 %
Neutro Abs: 6 K/uL (ref 1.7–7.7)
Neutrophils Relative %: 85 %
Platelets: 255 K/uL (ref 150–400)
RBC: 4.57 MIL/uL (ref 4.22–5.81)
RDW: 13.8 % (ref 11.5–15.5)
WBC: 7 K/uL (ref 4.0–10.5)
nRBC: 0 % (ref 0.0–0.2)

## 2023-12-04 LAB — COMPREHENSIVE METABOLIC PANEL WITH GFR
ALT: 107 U/L — ABNORMAL HIGH (ref 0–44)
AST: 101 U/L — ABNORMAL HIGH (ref 15–41)
Albumin: 4.2 g/dL (ref 3.5–5.0)
Alkaline Phosphatase: 117 U/L (ref 38–126)
Anion gap: 15 (ref 5–15)
BUN: 7 mg/dL — ABNORMAL LOW (ref 8–23)
CO2: 22 mmol/L (ref 22–32)
Calcium: 8.6 mg/dL — ABNORMAL LOW (ref 8.9–10.3)
Chloride: 98 mmol/L (ref 98–111)
Creatinine, Ser: 0.75 mg/dL (ref 0.61–1.24)
GFR, Estimated: >60 mL/min (ref >60)
Glucose, Bld: 95 mg/dL (ref 70–99)
Potassium: 4 mmol/L (ref 3.5–5.1)
Sodium: 135 mmol/L (ref 135–145)
Total Bilirubin: 0.6 mg/dL (ref 0.0–1.2)
Total Protein: 6.8 g/dL (ref 6.5–8.1)

## 2023-12-04 LAB — TSH: TSH: 1.41 u[IU]/mL (ref 0.350–4.500)

## 2023-12-04 MED ORDER — HEPARIN SOD (PORK) LOCK FLUSH 100 UNIT/ML IV SOLN
500.0000 [IU] | Freq: Once | INTRAVENOUS | Status: AC
  Administered 2023-12-04: 500 [IU] via INTRAVENOUS

## 2023-12-04 MED ORDER — IOHEXOL 300 MG/ML  SOLN
75.0000 mL | Freq: Once | INTRAMUSCULAR | Status: AC | PRN
  Administered 2023-12-04: 75 mL via INTRAVENOUS

## 2023-12-04 MED ORDER — SODIUM CHLORIDE 0.9 % IV SOLN: INTRAVENOUS | Status: AC

## 2023-12-04 NOTE — Progress Notes (Signed)
 Patient tolerated hydration with no complaints voiced.  Port site clean and dry with good blood return noted before and after hydration.  No bruising or swelling noted with port.  Band aid applied.  VSS with discharge and left ambulatory with no s/s of distress noted.

## 2023-12-04 NOTE — Patient Instructions (Signed)

## 2023-12-11 ENCOUNTER — Inpatient Hospital Stay: Attending: Hematology | Admitting: Oncology

## 2023-12-11 VITALS — BP 110/69 | HR 103 | Temp 99.5°F | Resp 19 | Ht 66.0 in | Wt 168.0 lb

## 2023-12-11 DIAGNOSIS — F17210 Nicotine dependence, cigarettes, uncomplicated: Secondary | ICD-10-CM | POA: Insufficient documentation

## 2023-12-11 DIAGNOSIS — Z7982 Long term (current) use of aspirin: Secondary | ICD-10-CM | POA: Insufficient documentation

## 2023-12-11 DIAGNOSIS — Z9221 Personal history of antineoplastic chemotherapy: Secondary | ICD-10-CM | POA: Diagnosis not present

## 2023-12-11 DIAGNOSIS — Z79899 Other long term (current) drug therapy: Secondary | ICD-10-CM | POA: Insufficient documentation

## 2023-12-11 DIAGNOSIS — C3411 Malignant neoplasm of upper lobe, right bronchus or lung: Secondary | ICD-10-CM | POA: Insufficient documentation

## 2023-12-11 DIAGNOSIS — R918 Other nonspecific abnormal finding of lung field: Secondary | ICD-10-CM

## 2023-12-11 DIAGNOSIS — C3491 Malignant neoplasm of unspecified part of right bronchus or lung: Secondary | ICD-10-CM

## 2023-12-11 DIAGNOSIS — Z923 Personal history of irradiation: Secondary | ICD-10-CM | POA: Diagnosis not present

## 2023-12-11 NOTE — Progress Notes (Signed)
 [facility] [address] [place] [address]    Clinic Day:  12/11/23   Referring physician: Shona Norleen PEDLAR, MD  Carlos [name] Care Team: Shona Norleen PEDLAR, MD as PCP - General (Internal Medicine) Celestia Joesph SQUIBB, RN as Oncology Nurse Navigator (Medical Oncology)   ASSESSMENT & PLAN:   Assessment: 1.  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  No hemoptysis/weight loss. - Chest x-ray (04/10/2022): Mid right lung mass. - CT chest (04/13/2022): 9.2 x 5.7 x 6.6 cm right upper lobe mass with lobulations appearing to extend across the minor fissure into the right middle lobe and right lower lobe.  Satellite lesion in the right upper lobe measures 1.6 x 1.4 x 1.4 cm.  No findings of chest wall invasion.  Borderline prominent lower right paratracheal lymph node 0.9 cm.  Old granulomatous disease.  Small left adrenal adenoma. - Has right posterior chest wall pain for the past 6 months, dull type.  Carlos [name] is not requiring any pain medication. - Brain MRI (04/19/2022): No metastatic disease. - PET scan (04/19/2022): 9 x 5.7 cm right upper lobe mass with SUV 11.8.  1.3 x 0.9 cm satellite nodule in the right upper lobe with SUV 3.8.  No adenopathy or distant metastatic disease. - Bronchoscopy and biopsy by Dr. Shelah - Pathology (04/23/2022): Both right upper lobe lung nodule FNA consistent with poorly differentiated carcinoma.  IHC diffusely positive for TTF-1, Napsin, synaptophysin and CK7.  CD56 negative.  P63 and CK5/6 negative.  Given Napsin positivity, favor adenocarcinoma.  There is also diffuse synaptophysin positivity consistent with neuroendocrine differentiation.  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 - Discussed with Dr. Kerrin.  Not a surgical candidate as Carlos [name] requires pneumonectomy and has suboptimal DLCO. - Chemoradiation with carboplatin  and paclitaxel   from 05/22/2022 through 07/06/2022 - Consolidation durvalumab  from 07/27/2022 through 08/15/2023   2.  Social/family history: - Carlos [name] is seen with his 2 sisters today.  Lives by himself and is independent of ADLs and IADLs.  Carlos [name] retired after working at [employer] for the last 34 years.  No asbestos exposure.  No other chemicals.  Smokes more than 1 pack/day for the last 50 years (started at age 39) quit smoking for 5 years before starting back again. - Brother died of lung cancer.  Father had bladder cancer.  Maternal uncle had cancer.    Plan: Assessment & Plan Mass of upper lobe of right lung Completed treatment with Durvalumab  on 08/15/2023. Chemo complicated by elevated LFTs. Repeat labs from 12/04/2023 showed unremarkable CBC.  CMP shows persistently but improved liver enzymes with AST of 101 ALT 107.  Previously, ultrasound was performed which showed hepatic steatosis which is likely contributing to this.  No new medications. We reviewed his most recent CT scan from 12/04/2023 which showed stable perihilar mass and right upper lobe nodule compared to prior imaging.  Right perihilar mass is decreased in size from prior PET.  No new or enlarging nodules.  No evidence of thoracic adenopathy or pleural effusion. Previously Dr. Katragadda mentioned increasing scans to every 6 months versus every 4. Carlos [name] in agreement with this. Return to clinic in 6 months with labs a few days before, CT scan and follow-up.  Non-small cell cancer of right lung (HCC)     2.  Anxiety/depression: - Carlos [name] will continue clonazepam  1 mg twice daily which  is helping.   3.  COPD: - Continue Tessalon  Perles as needed. -She is followed by pulmonology. -Carlos [name] has noticed increased shortness of breath with exertion and lightheadedness when Carlos [name] sits or stands. -Discussed reaching out to Dr. Shelah. -She currently has been using nebulizer and inhalers.    Orders Placed This Encounter  Procedures   CT CHEST W CONTRAST     Standing Status:   Future    Expected Date:   06/10/2024    Expiration Date:   12/10/2024    If indicated for the ordered procedure, I authorize the administration of contrast media per Radiology protocol:   Yes    Does the Carlos [name] have a contrast media/X-ray dye allergy?:   No    Preferred imaging location?:   [facility]    Release to Carlos [name]:   Immediate [1]    Release to Carlos [name]:   Immediate   CBC with Differential    Standing Status:   Future    Expected Date:   06/10/2024    Expiration Date:   12/10/2024   Comprehensive metabolic panel    Standing Status:   Future    Expected Date:   06/10/2024    Expiration Date:   12/10/2024   Magnesium     Standing Status:   Future    Expected Date:   06/10/2024    Expiration Date:   12/10/2024   Carlos FORBES Hope, NP   12/3/20253:30 PM  CHIEF COMPLAINT:   Diagnosis: RUL lung adenocarcinoma    Cancer Staging  Non-small cell cancer of right lung [facility]) Staging form: Lung, AJCC 8th Edition - Clinical stage from 05/01/2022: Stage IIIA (cT4, cN0, cM0) - Unsigned    Prior Therapy: none  Current Therapy:  concurrent chemoradiation with carboplatin  + paclitaxel     HISTORY OF PRESENT ILLNESS:   Oncology History  Non-small cell cancer of right lung (HCC)  05/01/2022 Initial Diagnosis   Non-small cell cancer of right lung (HCC)   05/22/2022 - 07/05/2022 Chemotherapy   Carlos [name] is on Treatment Plan : LUNG Carboplatin  + Paclitaxel  + XRT q7d     07/27/2022 -  Chemotherapy   Carlos [name] is on Treatment Plan : LUNG Durvalumab  q 28 day (beginning 11/22/22)        INTERVAL HISTORY:   Carlos [name] is a 68 y.o. male presenting to clinic today for follow up of RUL lung adenocarcinoma.   Since his last visit, Carlos [name] had a CT chest which showed more or less stable disease.  In the interim, Carlos [name] was seen in the ED on 09/20/2023 for shortness of breath x 3 months prompting imaging which showed no definite PE, grossly stable 4.3 x 2.6 cm mass in right perihilar  region consistent with history of malignancy.  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  Stable 13 mm spiculated density is noted more superiorly in right upper lobe concerning for metastatic disease.  Hepatic steatosis and aortic arthrosclerosis.  Today Carlos [name] presents with his wife and daughter.  Reports overall feeling stable.  Appetite and energy levels are 100%.  No pain.  Cough and shortness of breath are overall stable and not worsening.  Carlos [name] uses an inhaler and a nebulizer as needed.  Carlos [name] has dizziness when Carlos [name] stands too quickly.  Has numbness and burning in his feet.  Has insomnia at times.   PAST MEDICAL HISTORY:   Past Medical History: Past Medical History:  Diagnosis Date   Anxiety    Cancer (HCC)  Skin cancer left shoulder   Chronic low back pain    Complication of anesthesia    Dermatofibrosarcoma protubera of right shoulder    Dyspnea    GERD (gastroesophageal reflux disease)    Hyperlipidemia    Lung cancer (HCC) 04/23/2022   PONV (postoperative nausea and vomiting)    Port-A-Cath in place 05/17/2022   Prostate cancer [facility])     Surgical History: Past Surgical History:  Procedure Laterality Date   APPENDECTOMY     APPLICATION OF A-CELL OF EXTREMITY Right 07/09/2012   Procedure: PLACEMENT OF A-CELL TO UPPER EXTREMITY/PLACEMENT OF VAC;  Surgeon: Estefana Reichert, DO;  Location: [facility];  Service: Plastics;  Laterality: Right;   BRONCHIAL BRUSHINGS  04/23/2022   Procedure: BRONCHIAL BRUSHINGS;  Surgeon: [name], MD;  Location: [facility] ENDOSCOPY;  Service: Pulmonary;;   BRONCHIAL NEEDLE ASPIRATION BIOPSY  04/23/2022   Procedure: BRONCHIAL NEEDLE ASPIRATION BIOPSIES;  Surgeon: [name], MD;  Location: [facility] ENDOSCOPY;  Service: Pulmonary;;   COLONOSCOPY WITH PROPOFOL  N/A 09/30/2015   Procedure: COLONOSCOPY WITH PROPOFOL ;  Surgeon: Claudis RAYMOND Rivet, MD;  Location: [facility];  Service: Endoscopy;  Laterality: N/A;   830   EXCISION DERMATOFIBROCARCOMA PROTUBERAN RIGHT SHOULDER  2 - 3 WKS AGO   HEMOSTASIS CONTROL  04/23/2022   Procedure: HEMOSTASIS CONTROL;  Surgeon: [name], MD;  Location: [facility] ENDOSCOPY;  Service: Pulmonary;;   INGUINAL HERNIA REPAIR Left 1999   IR IMAGING GUIDED PORT INSERTION  04/30/2022   KNEE ARTHROSCOPY Left 2012   POLYPECTOMY  09/30/2015   Procedure: POLYPECTOMY;  Surgeon: Claudis RAYMOND Rivet, MD;  Location: [facility];  Service: Endoscopy;;  sigmoid   VIDEO BRONCHOSCOPY WITH ENDOBRONCHIAL ULTRASOUND Bilateral 04/23/2022   Procedure: VIDEO BRONCHOSCOPY WITH ENDOBRONCHIAL ULTRASOUND;  Surgeon: [name], MD;  Location: [facility] ENDOSCOPY;  Service: Pulmonary;  Laterality: Bilateral;   VIDEO BRONCHOSCOPY WITH RADIAL ENDOBRONCHIAL ULTRASOUND  04/23/2022   Procedure: VIDEO BRONCHOSCOPY WITH RADIAL ENDOBRONCHIAL ULTRASOUND;  Surgeon: [name], MD;  Location: [facility] ENDOSCOPY;  Service: Pulmonary;;    Social History: Social History   Socioeconomic History   Marital status: Divorced    Spouse name: Apolinar   Number of children: 1   Years of education: 10th   Highest education level: Not on file  Occupational History    Employer: [employer]    Comment: [employer]  Tobacco Use   Smoking status: Every Day    Current packs/day: 1.00    Average packs/day: 1 pack/day for 40.0 years (40.0 ttl pk-yrs)    Types: Cigarettes   Smokeless tobacco: Never  Vaping Use   Vaping status: Never Used  Substance and Sexual Activity   Alcohol use: Yes    Comment: 2-3 cans beer/day   Drug use: No   Sexual activity: Not on file  Other Topics Concern   Not on file  Social History Narrative   Carlos [name] lives at home with his dog. 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 Carlos [name] has 10 grade education.Carlos [name] is right-handed.Caffeine Use: 1 cup of coffee; 3-4 sodas daily   Social Drivers of Health   Financial Resource Strain: High Risk  (05/10/2022)   Overall Financial Resource Strain (CARDIA)    Difficulty of Paying Living Expenses: Very hard  Food Insecurity: Food Insecurity Present (05/04/2022)   Hunger Vital Sign    Worried About Running Out of Food in the Last Year: Sometimes true    Ran Out  of Food in the Last Year: Sometimes true  Transportation Needs: No Transportation Needs (05/04/2022)   PRAPARE - Administrator, Civil Service (Medical): No    Lack of Transportation (Non-Medical): No  Physical Activity: Not on file  Stress: Not on file  Social Connections: Not on file  Intimate Partner Violence: Not At Risk (05/04/2022)   Humiliation, Afraid, Rape, and Kick questionnaire    Fear of Current or Ex-Partner: No    Emotionally Abused: No    Physically Abused: No    Sexually Abused: No    Family History: Family History  Problem Relation Age of Onset   Dementia Mother    Cancer Father     Current Medications:  Current Outpatient Medications:    aspirin EC 81 MG tablet, Take 81 mg by mouth at bedtime. Swallow whole., Disp: , Rfl:    benzonatate  (TESSALON ) 200 MG capsule, Take 1 capsule (200 mg total) by mouth 3 (three) times daily as needed for cough., Disp: 120 capsule, Rfl: 0   budesonide-glycopyrrolate-formoterol (BREZTRI  AEROSPHERE) 160-9-4.8 MCG/ACT AERO inhaler, Inhale 2 puffs into the lungs in the morning and at bedtime., Disp: 10.7 g, Rfl: 12   clonazePAM  (KLONOPIN ) 1 MG tablet, Take 1 tablet (1 mg total) by mouth at bedtime., Disp: 60 tablet, Rfl: 0   doxycycline  (VIBRAMYCIN ) 100 MG capsule, Take 1 capsule (100 mg total) by mouth 2 (two) times daily., Disp: 20 capsule, Rfl: 0   Durvalumab  (IMFINZI  IV), Inject into the vein every 14 (fourteen) days., Disp: , Rfl:    famotidine  (PEPCID ) 20 MG tablet, Take 20 mg by mouth daily., Disp: , Rfl:    levalbuterol  (XOPENEX ) 0.63 MG/3ML nebulizer solution, Take 3 mLs (0.63 mg total) by nebulization every 4 (four) hours as needed for wheezing or shortness  of breath., Disp: 3 mL, Rfl: 12   lidocaine -prilocaine  (EMLA ) cream, Apply 1 Application topically as needed., Disp: 30 g, Rfl: 2   loperamide  (IMODIUM ) 2 MG capsule, Take 1 capsule (2 mg total) by mouth as needed for diarrhea or loose stools (Take 2 capsules after the first loose stool and then 1 capsule after each loose stool bt do not exceed more than 8 capsules in a 24 hr period. If it is bedtime take 2 capsules at bedtime and then take 2 capsules every 4 hours until morning)., Disp: 30 capsule, Rfl: 1   predniSONE  (DELTASONE ) 20 MG tablet, Take 3 tablets (60 mg total) by mouth daily., Disp: 12 tablet, Rfl: 0   prochlorperazine  (COMPAZINE ) 10 MG tablet, Take 1 tablet (10 mg total) by mouth every 6 (six) hours as needed., Disp: 30 tablet, Rfl: 3   simvastatin  (ZOCOR ) 20 MG tablet, Take 1 tablet (20 mg total) by mouth at bedtime., Disp: 90 tablet, Rfl: 3   tamsulosin  (FLOMAX ) 0.4 MG CAPS capsule, Take 1 capsule (0.4 mg total) by mouth at bedtime., Disp: 90 capsule, Rfl: 3   Allergies: Allergies  Allergen Reactions   Albuterol  Other (See Comments)    Dizziness and panick attack   Ciprofloxacin Swelling and Other (See Comments)    Swelling in hands, numbness in arms and joint pain   Atorvastatin     Other Reaction(s): Not available   Gabapentin  Other (See Comments)    Double vision  Other Reaction(s): Not available  gabapentin     REVIEW OF SYSTEMS:   Review of Systems  Constitutional:  Positive for fatigue.  Respiratory:  Positive for cough and shortness of breath.   Neurological:  Positive for dizziness and numbness.  Psychiatric/Behavioral:  Positive for sleep disturbance.      VITALS:   Blood pressure 110/69, pulse (!) 103, temperature 99.5 F (37.5 C), temperature source Tympanic, resp. rate 19, height 5' 6 (1.676 m), weight 168 lb (76.2 kg), SpO2 100%.  Wt Readings from Last 3 Encounters:  12/11/23 168 lb (76.2 kg)  08/15/23 177 lb 6.4 oz (80.5 kg)  08/08/23 177 lb 11.1  oz (80.6 kg)    Body mass index is 27.12 kg/m.  Performance status (ECOG): 1 - Symptomatic but completely ambulatory  PHYSICAL EXAM:   Physical Exam Vitals and nursing note reviewed. Exam conducted with a chaperone present.  Constitutional:      Appearance: Normal appearance.  Cardiovascular:     Rate and Rhythm: Normal rate and regular rhythm.     Pulses: Normal pulses.     Heart sounds: Normal heart sounds.  Pulmonary:     Effort: Pulmonary effort is normal.     Breath sounds: Normal breath sounds.  Abdominal:     Palpations: Abdomen is soft. There is no hepatomegaly, splenomegaly or mass.     Tenderness: There is no abdominal tenderness.  Musculoskeletal:     Right lower leg: No edema.     Left lower leg: No edema.  Lymphadenopathy:     Cervical: No cervical adenopathy.     Right cervical: No superficial, deep or posterior cervical adenopathy.    Left cervical: No superficial, deep or posterior cervical adenopathy.     Upper Body:     Right upper body: No supraclavicular or axillary adenopathy.     Left upper body: No supraclavicular or axillary adenopathy.  Neurological:     General: No focal deficit present.     Mental Status: Carlos [name] is alert and oriented to person, place, and time.  Psychiatric:        Mood and Affect: Mood normal.        Behavior: Behavior normal.     LABS:      Latest Ref Rng & Units 12/04/2023    1:10 PM 09/20/2023   10:25 AM 08/15/2023   12:44 PM  CBC  WBC 4.0 - 10.5 K/uL 7.0  7.7  9.0   Hemoglobin 13.0 - 17.0 g/dL 84.7  84.2  84.3   Hematocrit 39.0 - 52.0 % 43.7  45.3  44.8   Platelets 150 - 400 K/uL 255  242  329       Latest Ref Rng & Units 12/04/2023    1:10 PM 09/20/2023   10:25 AM 08/15/2023   12:44 PM  CMP  Glucose 70 - 99 mg/dL 95  899  813   BUN 8 - 23 mg/dL 7  6  8    Creatinine 0.61 - 1.24 mg/dL 9.24  9.14  9.21   Sodium 135 - 145 mmol/L 135  133  136   Potassium 3.5 - 5.1 mmol/L 4.0  4.0  3.9   Chloride 98 - 111 mmol/L 98   99  99   CO2 22 - 32 mmol/L 22  16  22    Calcium 8.9 - 10.3 mg/dL 8.6  8.4  8.8   Total Protein 6.5 - 8.1 g/dL 6.8  7.4  7.2   Total Bilirubin 0.0 - 1.2 mg/dL 0.6  1.3  0.8   Alkaline Phos 38 - 126 U/L 117  95  80   AST 15 - 41 U/L 101  165  69   ALT 0 -  44 U/L 107  149  83      No results found for: CEA1, CEA / No results found for: CEA1, CEA Lab Results  Component Value Date   PSA1 4.7 (H) 04/12/2022   No results found for: CAN199 No results found for: CAN125  No results found for: TOTALPROTELP, ALBUMINELP, A1GS, A2GS, BETS, BETA2SER, GAMS, MSPIKE, SPEI No results found for: TIBC, FERRITIN, IRONPCTSAT No results found for: LDH   STUDIES:   CT CHEST W CONTRAST Result Date: 12/10/2023 CLINICAL DATA:  Non-small cell lung cancer (NSCLC), monitor * Tracking Code: BO * EXAM: CT CHEST WITH CONTRAST TECHNIQUE: Multidetector CT imaging of the chest was performed during intravenous contrast administration. RADIATION DOSE REDUCTION: This exam was performed according to the departmental dose-optimization program which includes automated exposure control, adjustment of the mA and/or kV according to Carlos [name] size and/or use of iterative reconstruction technique. CONTRAST:  75mL OMNIPAQUE  IOHEXOL  300 MG/ML  SOLN COMPARISON:  Chest CTA 09/20/2023. Chest CT 07/29/2023 and 05/03/2023. PET-CT 04/19/2022. FINDINGS: Cardiovascular: No acute vascular findings. There is atherosclerosis of the aorta, great vessels and coronary arteries. Left IJ Port-A-Cath extends to the superior cavoatrial junction. The heart size is normal. There is no pericardial effusion. Mediastinum/Nodes: There are no enlarged mediastinal, hilar or axillary lymph nodes. The thyroid  gland, trachea and esophagus demonstrate no significant findings. Lungs/Pleura: No pleural effusion or pneumothorax. Mild centrilobular emphysema again noted. There are chronic radiation changes in the right perihilar region  with bandlike opacities extending along the central aspects of the fissures. The right perihilar mass measures 4.4 x 2.5 cm on image 60/5 (4.3 x 2.9 cm on 07/29/2023 CT). Solid right upper lobe nodule measures 1.3 x 1.2 cm on image 43/5 (previously 1.6 x 1.3 cm). A tubular soft tissue nodule anteriorly in the right lower lobe appears stable, measuring up to 0.9 cm on image 88/5. No new or enlarging nodules are identified. There are stable ground-glass nodules in the left lower lobe on images 69/5 and 82/5. Upper abdomen: Severe hepatic steatosis again noted with stable calcified granulomas. Stable thickening of both adrenal glands, most consistent with hyperplasia. No specific follow-up imaging recommended. Musculoskeletal/Chest wall: There is no chest wall mass or suspicious osseous finding. Asymmetric gynecomastia on the right appears unchanged. Mild spondylosis. IMPRESSION: 1. Stable right perihilar mass and right upper lobe nodule compared with recent prior imaging. The right perihilar mass has decreased in size from the prior PET-CT. Stable tubular nodule in the right lower lobe. 2. No new or enlarging nodules. No evidence of thoracic adenopathy or pleural effusion. Consider PET-CT at time of next follow-up study. 3. Hepatic steatosis. 4. Aortic Atherosclerosis (ICD10-I70.0) and Emphysema (ICD10-J43.9). Electronically Signed   By: Elsie Perone M.D.   On: 12/10/2023 15:36

## 2023-12-11 NOTE — Assessment & Plan Note (Addendum)
 Completed treatment with Durvalumab  on 08/15/2023. Chemo complicated by elevated LFTs. Repeat labs from 12/04/2023 showed unremarkable CBC.  CMP shows persistently but improved liver enzymes with AST of 101 ALT 107.  Previously, ultrasound was performed which showed hepatic steatosis which is likely contributing to this.  No new medications. We reviewed his most recent CT scan from 12/04/2023 which showed stable perihilar mass and right upper lobe nodule compared to prior imaging.  Right perihilar mass is decreased in size from prior PET.  No new or enlarging nodules.  No evidence of thoracic adenopathy or pleural effusion. Previously Dr. Katragadda mentioned increasing scans to every 6 months versus every 4. Patient in agreement with this. Return to clinic in 6 months with labs a few days before, CT scan and follow-up.

## 2023-12-12 ENCOUNTER — Other Ambulatory Visit: Payer: Self-pay

## 2023-12-23 ENCOUNTER — Other Ambulatory Visit: Payer: Self-pay | Admitting: *Deleted

## 2023-12-23 MED ORDER — CLONAZEPAM 1 MG PO TABS: 1.0000 mg | ORAL_TABLET | Freq: Every day | ORAL | 0 refills | Status: AC

## 2023-12-26 ENCOUNTER — Ambulatory Visit: Admitting: Emergency Medicine

## 2023-12-26 VITALS — BP 110/72 | HR 117 | Temp 97.9°F | Wt 176.8 lb

## 2023-12-26 DIAGNOSIS — Z720 Tobacco use: Secondary | ICD-10-CM

## 2023-12-26 DIAGNOSIS — J449 Chronic obstructive pulmonary disease, unspecified: Secondary | ICD-10-CM

## 2023-12-26 DIAGNOSIS — C3491 Malignant neoplasm of unspecified part of right bronchus or lung: Secondary | ICD-10-CM

## 2023-12-26 NOTE — Assessment & Plan Note (Signed)
 Please continue your Breztri  2 puffs twice a day.  Rinse and gargle after using. Okay to use your Xopenex  nebulizer twice a day or up to every 6 hours if needed for shortness of breath Keep your albuterol  available to use 2 puffs if you need for shortness of breath Depending on how your breathing is doing at your next visit we can talk about performing a walking oxygen test to see if you qualify for oxygen. Follow Dr. Shelah in June 2026.  Please call sooner if you have any problems.

## 2023-12-26 NOTE — Assessment & Plan Note (Signed)
 Most recent CT chest was done 12/04/2023 off of Durvalumab  for 3 months.  Scan is stable.  Reassured him about this.  Next scan to be done in 6 months  We reviewed your CT scan of the chest today.  This is stable compared with priors.  Good news.  Get your repeat scan in 6 months as planned

## 2023-12-26 NOTE — Assessment & Plan Note (Signed)
 We talked about your cigarettes.  Work hard to decrease to less than 1 pack daily by next visit.

## 2023-12-26 NOTE — Progress Notes (Signed)
 Subjective:    Patient ID: Carlos [name], male    DOB: 13-Jan-1955, 68 y.o.   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  He was treated with chemoradiation and then Durvalumab  - currently off for last 3 months. Remains on Breztri  on patient assistance, has Xopenex  nebs that he uses bid. Flu and PNA shots up to date.   CT chest 12/04/2023 reviewed by me showed stable right perihilar mass and right upper lobe nodule compared with priors, stable tubular soft tissue nodule in the anterior right lower lobe 9 mm, no new or enlarging nodule seen.  There are stable ground glass nodules in the left lower lobe    Review of Systems As per HPI  Past Medical History:  Diagnosis Date   Anxiety    Cancer (HCC)    Skin cancer left shoulder   Chronic low back pain    Complication of anesthesia    Dermatofibrosarcoma protubera of right shoulder    Dyspnea    GERD (gastroesophageal reflux disease)    Hyperlipidemia    Lung cancer (HCC) 04/23/2022   PONV (postoperative nausea and vomiting)    Port-A-Cath in place 05/17/2022   Prostate cancer [facility])      Family History  Problem Relation Age of Onset   Dementia Mother    Cancer Father   Brother died of lung cancer  Social History   Socioeconomic History   Marital status: Divorced    Spouse name: Apolinar   Number of children: 1   Years of education: 10th   Highest education level: Not on file  Occupational History    Employer: [employer]    Comment: [employer]  Tobacco Use   Smoking status: Every Day    Current packs/day: 1.00    Average packs/day: 1 pack/day for 40.0 years (40.0 ttl pk-yrs)    Types: Cigarettes   Smokeless tobacco: Never   Tobacco comments:    Smokes over  1ppd daily.   Vaping Use   Vaping status: Never Used  Substance and Sexual Activity   Alcohol use: Yes    Comment: 2-3 cans beer/day   Drug use: No   Sexual activity: Not on file  Other Topics Concern   Not on file  Social History Narrative   Patient lives at home with his dog. 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 Patient has 10 grade education.Patient is right-handed.Caffeine Use: 1 cup of coffee; 3-4 sodas daily   Social Drivers of Health   Tobacco Use: High Risk (12/26/2023)   Patient History    Smoking Tobacco Use: Every Day    Smokeless Tobacco Use: Never    Passive Exposure: Not on file  Financial Resource Strain: High Risk (05/10/2022)   Overall Financial Resource Strain (CARDIA)    Difficulty of Paying Living Expenses: Very hard  Food Insecurity: Food Insecurity Present (05/04/2022)   Hunger Vital Sign    Worried About Running Out of Food in the Last Year: Sometimes true    Ran Out of Food in the Last Year: Sometimes true  Transportation Needs: No Transportation Needs (05/04/2022)   PRAPARE - Administrator, Civil Service (Medical): No    Lack of Transportation (Non-Medical): No  Physical Activity: Not on  file  Stress: Not on file  Social Connections: Not on file  Intimate Partner Violence: Not At Risk (05/04/2022)   Humiliation, Afraid, Rape, and Kick questionnaire    Fear of Current or Ex-Partner: No    Emotionally Abused: No    Physically Abused: No    Sexually Abused: No  Depression (PHQ2-9): Low Risk (12/11/2023)   Depression (PHQ2-9)    PHQ-2 Score: 0  Alcohol Screen: Not on file  Housing: Low Risk (05/04/2022)   Housing    Last Housing Risk Score: 0  Utilities: Not At Risk (05/04/2022)   AHC Utilities    Threatened with loss of utilities: No  Health Literacy: Not on file  No asbestos exposure, no chemicals Does continue to smoke  Allergies  Allergen Reactions   Albuterol  Other (See Comments)    Dizziness and  panick attack   Ciprofloxacin Swelling and Other (See Comments)    Swelling in hands, numbness in arms and joint pain   Atorvastatin     Other Reaction(s): Not available   Gabapentin  Other (See Comments)    Double vision  Other Reaction(s): Not available  gabapentin      Outpatient Medications Prior to Visit  Medication Sig Dispense Refill   aspirin EC 81 MG tablet Take 81 mg by mouth at bedtime. Swallow whole.     budesonide-glycopyrrolate-formoterol (BREZTRI  AEROSPHERE) 160-9-4.8 MCG/ACT AERO inhaler Inhale 2 puffs into the lungs in the morning and at bedtime. 10.7 g 12   clonazePAM  (KLONOPIN ) 1 MG tablet Take 1 tablet (1 mg total) by mouth at bedtime. 60 tablet 0   Durvalumab  (IMFINZI  IV) Inject into the vein every 14 (fourteen) days.     levalbuterol  (XOPENEX ) 0.63 MG/3ML nebulizer solution Take 3 mLs (0.63 mg total) by nebulization every 4 (four) hours as needed for wheezing or shortness of breath. 3 mL 12   lidocaine -prilocaine  (EMLA ) cream Apply 1 Application topically as needed. 30 g 2   prochlorperazine  (COMPAZINE ) 10 MG tablet Take 1 tablet (10 mg total) by mouth every 6 (six) hours as needed. 30 tablet 3   simvastatin  (ZOCOR ) 20 MG tablet Take 1 tablet (20 mg total) by mouth at bedtime. 90 tablet 3   tamsulosin  (FLOMAX ) 0.4 MG CAPS capsule Take 1 capsule (0.4 mg total) by mouth at bedtime. 90 capsule 3   benzonatate  (TESSALON ) 200 MG capsule Take 1 capsule (200 mg total) by mouth 3 (three) times daily as needed for cough. (Patient not taking: Reported on 12/26/2023) 120 capsule 0   doxycycline  (VIBRAMYCIN ) 100 MG capsule Take 1 capsule (100 mg total) by mouth 2 (two) times daily. (Patient not taking: Reported on 12/26/2023) 20 capsule 0   famotidine  (PEPCID ) 20 MG tablet Take 20 mg by mouth daily. (Patient not taking: Reported on 12/26/2023)     loperamide  (IMODIUM ) 2 MG capsule Take 1 capsule (2 mg total) by mouth as needed for diarrhea or loose stools (Take 2 capsules after the  first loose stool and then 1 capsule after each loose stool bt do not exceed more than 8 capsules in a 24 hr period. If it is bedtime take 2 capsules at bedtime and then take 2 capsules every 4 hours until morning). (Patient not taking: Reported on 12/26/2023) 30 capsule 1   predniSONE  (DELTASONE ) 20 MG tablet Take 3 tablets (60 mg total) by mouth daily. (Patient not taking: Reported on 12/26/2023) 12 tablet 0   No facility-administered medications prior to visit.         Objective:  Physical Exam  Vitals:   12/26/23 0947  BP: 110/72  Pulse: (!) 117  Temp: 97.9 F (36.6 C)  SpO2: 98%  Weight: 176 lb 12.8 oz (80.2 kg)   Gen: Pleasant, well-nourished, in no distress,  normal affect  ENT: No lesions,  mouth clear,  oropharynx clear, no postnasal drip  Neck: No JVD, no stridor  Lungs: No use of accessory muscles, no crackles or wheezing on normal respiration, no wheeze on forced expiration  Cardiovascular: RRR, heart sounds normal, no murmur or gallops, no peripheral edema  Musculoskeletal: No deformities, no cyanosis or clubbing  Neuro: alert, awake, non focal  Skin: Warm, no lesions or rash      Assessment & Plan:   COPD (chronic obstructive pulmonary disease) (HCC) Please continue your Breztri  2 puffs twice a day.  Rinse and gargle after using. Okay to use your Xopenex  nebulizer twice a day or up to every 6 hours if needed for shortness of breath Keep your albuterol  available to use 2 puffs if you need for shortness of breath Depending on how your breathing is doing at your next visit we can talk about performing a walking oxygen test to see if you qualify for oxygen. Follow Dr. Shelah in June 2026.  Please call sooner if you have any problems.  Non-small cell cancer of right lung (HCC) Most recent CT chest was done 12/04/2023 off of Durvalumab  for 3 months.  Scan is stable.  Reassured him about this.  Next scan to be done in 6 months  We reviewed your CT scan of the  chest today.  This is stable compared with priors.  Good news.  Get your repeat scan in 6 months as planned  Tobacco use We talked about your cigarettes.  Work hard to decrease to less than 1 pack daily by next visit.  I personally spent a total of 26 minutes in the care of the patient today including preparing to see the patient, getting/reviewing separately obtained history, performing a medically appropriate exam/evaluation, counseling and educating, placing orders, documenting clinical information in the EHR, independently interpreting results, and communicating results.   Lamar Shelah, MD, PhD 12/26/2023, 1:03 PM [facility] [phone] or if no answer before 7:00PM call [phone] For any issues after 7:00PM please call eLink [phone]

## 2023-12-26 NOTE — Patient Instructions (Signed)
 Please continue your Breztri  2 puffs twice a day.  Rinse and gargle after using. Okay to use your Xopenex  nebulizer twice a day or up to every 6 hours if needed for shortness of breath Keep your albuterol  available to use 2 puffs if you need for shortness of breath Depending on how your breathing is doing at your next visit we can talk about performing a walking oxygen test to see if you qualify for oxygen. We reviewed your CT scan of the chest today.  This is stable compared with priors.  Good news.  Get your repeat scan in 6 months as planned We talked about your cigarettes.  Work hard to decrease to less than 1 pack daily by her next visit. Follow Dr. Shelah in June 2026.  Please call sooner if you have any problems.

## 2023-12-27 ENCOUNTER — Other Ambulatory Visit: Payer: Self-pay

## 2024-01-06 ENCOUNTER — Encounter: Payer: Self-pay | Admitting: *Deleted

## 2024-03-17 ENCOUNTER — Inpatient Hospital Stay: Attending: Hematology

## 2024-06-10 ENCOUNTER — Ambulatory Visit (HOSPITAL_COMMUNITY)

## 2024-06-10 ENCOUNTER — Inpatient Hospital Stay

## 2024-06-17 ENCOUNTER — Inpatient Hospital Stay: Admitting: Oncology

## 2024-06-25 ENCOUNTER — Ambulatory Visit: Admitting: Emergency Medicine
